# Patient Record
Sex: Female | Born: 1945 | Race: White | Hispanic: No
Health system: Southern US, Community
[De-identification: ages and names within clinical notes are randomized; demographics above are authoritative.]

## PROBLEM LIST (undated history)

## (undated) DIAGNOSIS — I10 Essential (primary) hypertension: Secondary | ICD-10-CM

## (undated) DIAGNOSIS — F419 Anxiety disorder, unspecified: Secondary | ICD-10-CM

## (undated) DIAGNOSIS — H409 Unspecified glaucoma: Secondary | ICD-10-CM

## (undated) DIAGNOSIS — E079 Disorder of thyroid, unspecified: Secondary | ICD-10-CM

## (undated) DIAGNOSIS — K219 Gastro-esophageal reflux disease without esophagitis: Secondary | ICD-10-CM

## (undated) DIAGNOSIS — M1990 Unspecified osteoarthritis, unspecified site: Secondary | ICD-10-CM

## (undated) DIAGNOSIS — E039 Hypothyroidism, unspecified: Secondary | ICD-10-CM

## (undated) DIAGNOSIS — I2699 Other pulmonary embolism without acute cor pulmonale: Secondary | ICD-10-CM

## (undated) DIAGNOSIS — J4530 Mild persistent asthma, uncomplicated: Secondary | ICD-10-CM

## (undated) DIAGNOSIS — Z9889 Other specified postprocedural states: Secondary | ICD-10-CM

## (undated) DIAGNOSIS — F329 Major depressive disorder, single episode, unspecified: Secondary | ICD-10-CM

## (undated) DIAGNOSIS — I82409 Acute embolism and thrombosis of unspecified deep veins of unspecified lower extremity: Secondary | ICD-10-CM

## (undated) DIAGNOSIS — R112 Nausea with vomiting, unspecified: Secondary | ICD-10-CM

## (undated) DIAGNOSIS — J189 Pneumonia, unspecified organism: Secondary | ICD-10-CM

## (undated) DIAGNOSIS — R05 Cough: Secondary | ICD-10-CM

## (undated) DIAGNOSIS — J309 Allergic rhinitis, unspecified: Secondary | ICD-10-CM

## (undated) DIAGNOSIS — F32A Depression, unspecified: Secondary | ICD-10-CM

## (undated) DIAGNOSIS — M720 Palmar fascial fibromatosis [Dupuytren]: Secondary | ICD-10-CM

## (undated) DIAGNOSIS — R053 Chronic cough: Secondary | ICD-10-CM

## (undated) HISTORY — DX: Gastro-esophageal reflux disease without esophagitis: K21.9

## (undated) HISTORY — PX: BLADDER SUSPENSION: SHX72

## (undated) HISTORY — PX: OTHER SURGICAL HISTORY: SHX169

## (undated) HISTORY — PX: DUPUYTREN CONTRACTURE RELEASE: SHX1478

## (undated) HISTORY — PX: BUNIONECTOMY: SHX129

## (undated) HISTORY — DX: Disorder of thyroid, unspecified: E07.9

## (undated) HISTORY — PX: KNEE ARTHROSCOPY: SUR90

## (undated) HISTORY — DX: Essential (primary) hypertension: I10

## (undated) HISTORY — PX: CATARACT EXTRACTION W/ INTRAOCULAR LENS  IMPLANT, BILATERAL: SHX1307

## (undated) HISTORY — PX: DILATION AND CURETTAGE OF UTERUS: SHX78

## (undated) HISTORY — DX: Hypothyroidism, unspecified: E03.9

---

## 1973-12-19 HISTORY — PX: TOTAL ABDOMINAL HYSTERECTOMY: SHX209

## 2000-11-23 ENCOUNTER — Other Ambulatory Visit: Admission: RE | Admit: 2000-11-23 | Discharge: 2000-11-23 | Payer: Self-pay | Admitting: Obstetrics and Gynecology

## 2001-12-24 ENCOUNTER — Ambulatory Visit (HOSPITAL_COMMUNITY): Admission: RE | Admit: 2001-12-24 | Discharge: 2001-12-24 | Payer: Self-pay | Admitting: Obstetrics and Gynecology

## 2001-12-24 ENCOUNTER — Encounter: Payer: Self-pay | Admitting: Obstetrics and Gynecology

## 2002-01-26 ENCOUNTER — Encounter: Payer: Self-pay | Admitting: Emergency Medicine

## 2002-01-26 ENCOUNTER — Emergency Department (HOSPITAL_COMMUNITY): Admission: EM | Admit: 2002-01-26 | Discharge: 2002-01-27 | Payer: Self-pay | Admitting: Emergency Medicine

## 2002-04-30 ENCOUNTER — Inpatient Hospital Stay (HOSPITAL_COMMUNITY): Admission: RE | Admit: 2002-04-30 | Discharge: 2002-05-01 | Payer: Self-pay | Admitting: Obstetrics and Gynecology

## 2002-07-04 ENCOUNTER — Ambulatory Visit (HOSPITAL_COMMUNITY): Admission: RE | Admit: 2002-07-04 | Discharge: 2002-07-04 | Payer: Self-pay | Admitting: Obstetrics and Gynecology

## 2003-05-12 ENCOUNTER — Encounter (INDEPENDENT_AMBULATORY_CARE_PROVIDER_SITE_OTHER): Payer: Self-pay | Admitting: Specialist

## 2003-05-12 ENCOUNTER — Ambulatory Visit (HOSPITAL_COMMUNITY): Admission: RE | Admit: 2003-05-12 | Discharge: 2003-05-12 | Payer: Self-pay | Admitting: *Deleted

## 2005-04-08 ENCOUNTER — Ambulatory Visit: Payer: Self-pay | Admitting: Internal Medicine

## 2005-08-31 ENCOUNTER — Ambulatory Visit: Payer: Self-pay | Admitting: Internal Medicine

## 2005-12-08 ENCOUNTER — Ambulatory Visit: Payer: Self-pay | Admitting: Internal Medicine

## 2006-01-02 ENCOUNTER — Ambulatory Visit: Payer: Self-pay | Admitting: Internal Medicine

## 2006-05-23 ENCOUNTER — Ambulatory Visit: Payer: Self-pay | Admitting: Internal Medicine

## 2006-09-16 ENCOUNTER — Emergency Department (HOSPITAL_COMMUNITY): Admission: EM | Admit: 2006-09-16 | Discharge: 2006-09-16 | Payer: Self-pay | Admitting: Emergency Medicine

## 2006-09-25 ENCOUNTER — Ambulatory Visit: Payer: Self-pay | Admitting: Internal Medicine

## 2006-12-14 ENCOUNTER — Ambulatory Visit: Payer: Self-pay | Admitting: Internal Medicine

## 2007-01-31 ENCOUNTER — Ambulatory Visit: Payer: Self-pay | Admitting: Internal Medicine

## 2007-05-31 ENCOUNTER — Ambulatory Visit: Payer: Self-pay | Admitting: Internal Medicine

## 2007-08-22 ENCOUNTER — Encounter: Admission: RE | Admit: 2007-08-22 | Discharge: 2007-08-22 | Payer: Self-pay | Admitting: Family Medicine

## 2007-10-17 ENCOUNTER — Ambulatory Visit: Payer: Self-pay | Admitting: Internal Medicine

## 2008-01-23 DIAGNOSIS — I10 Essential (primary) hypertension: Secondary | ICD-10-CM | POA: Insufficient documentation

## 2008-01-23 DIAGNOSIS — J3089 Other allergic rhinitis: Secondary | ICD-10-CM

## 2008-01-23 DIAGNOSIS — J4520 Mild intermittent asthma, uncomplicated: Secondary | ICD-10-CM

## 2008-01-23 DIAGNOSIS — K219 Gastro-esophageal reflux disease without esophagitis: Secondary | ICD-10-CM | POA: Insufficient documentation

## 2008-01-23 DIAGNOSIS — J302 Other seasonal allergic rhinitis: Secondary | ICD-10-CM | POA: Insufficient documentation

## 2008-01-23 DIAGNOSIS — E039 Hypothyroidism, unspecified: Secondary | ICD-10-CM | POA: Insufficient documentation

## 2008-01-24 ENCOUNTER — Ambulatory Visit: Payer: Self-pay | Admitting: Internal Medicine

## 2008-01-27 DIAGNOSIS — I10 Essential (primary) hypertension: Secondary | ICD-10-CM | POA: Insufficient documentation

## 2008-01-27 DIAGNOSIS — J45909 Unspecified asthma, uncomplicated: Secondary | ICD-10-CM | POA: Insufficient documentation

## 2008-03-13 ENCOUNTER — Ambulatory Visit: Payer: Self-pay | Admitting: Internal Medicine

## 2008-07-04 ENCOUNTER — Ambulatory Visit: Payer: Self-pay | Admitting: Internal Medicine

## 2008-08-27 ENCOUNTER — Encounter: Admission: RE | Admit: 2008-08-27 | Discharge: 2008-08-27 | Payer: Self-pay | Admitting: Family Medicine

## 2008-11-27 ENCOUNTER — Ambulatory Visit: Payer: Self-pay | Admitting: Internal Medicine

## 2009-02-10 ENCOUNTER — Ambulatory Visit: Payer: Self-pay | Admitting: Internal Medicine

## 2009-03-24 ENCOUNTER — Ambulatory Visit: Payer: Self-pay | Admitting: Internal Medicine

## 2009-08-19 ENCOUNTER — Ambulatory Visit (HOSPITAL_BASED_OUTPATIENT_CLINIC_OR_DEPARTMENT_OTHER): Admission: RE | Admit: 2009-08-19 | Discharge: 2009-08-19 | Payer: Self-pay | Admitting: Orthopedic Surgery

## 2009-08-31 ENCOUNTER — Encounter: Admission: RE | Admit: 2009-08-31 | Discharge: 2009-08-31 | Payer: Self-pay | Admitting: Family Medicine

## 2009-09-01 ENCOUNTER — Emergency Department (HOSPITAL_BASED_OUTPATIENT_CLINIC_OR_DEPARTMENT_OTHER): Admission: EM | Admit: 2009-09-01 | Discharge: 2009-09-01 | Payer: Self-pay | Admitting: Emergency Medicine

## 2009-09-07 ENCOUNTER — Ambulatory Visit: Payer: Self-pay | Admitting: Internal Medicine

## 2009-09-07 ENCOUNTER — Telehealth: Payer: Self-pay | Admitting: Internal Medicine

## 2010-02-04 ENCOUNTER — Ambulatory Visit: Payer: Self-pay | Admitting: Internal Medicine

## 2010-03-16 ENCOUNTER — Ambulatory Visit: Payer: Self-pay | Admitting: Internal Medicine

## 2010-06-01 ENCOUNTER — Ambulatory Visit: Payer: Self-pay | Admitting: Internal Medicine

## 2010-10-04 ENCOUNTER — Ambulatory Visit: Payer: Self-pay | Admitting: Internal Medicine

## 2010-10-12 ENCOUNTER — Encounter: Admission: RE | Admit: 2010-10-12 | Discharge: 2010-10-12 | Payer: Self-pay | Admitting: Family Medicine

## 2011-01-20 NOTE — Assessment & Plan Note (Signed)
Summary: allergy follow up visit/kcw   Primary Provider/Referring Provider:  Marinda Elk  CC:  Allergy follow up visit.  History of Present Illness:  01/24/08- Has a cold but otherwise feels she's done well.  Dr Foy [place] gave zpak. Still some cough. Would like more benzonatate. Denies fever. Nasal discharge is golden yellow. allergy vaccine GO 1:10 has done well- satisfied -helps Vaccine safety epipen talk  02-26-09- Allergic rhinitis, allergic asthma                        Husband with [place] Good year with no flu etc. 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 Can tell when she is due for allergy shot.  Had GOT for foot surgery with no respiratory complications.  March 16, 2010- allergic rhinitis, Assllergic asthma, GERD She recognizes cyclical cough with her GERD. Occasional otc antihistamine has been sufficient so far this year. Advair is used consistently without breakthrough cough or wheeze. Continues allergy vaccine without any problems. Needs update epipen.    Current Medications (verified): 1)  Flonase 50 Mcg/act Susp (Fluticasone Propionate) .Marland Kitchen.. 1-2 Sprays in Each Nostril 2)  Levothyroxine .... Take 1 Tablet By Mouth Once A Day 3)  Lactulose .Marland Kitchen.. 1 Tsp At [facility] 4)  Proair Hfa 108 (90 Base) Mcg/act Aers (Albuterol Sulfate) .... 2 Puffs Four Times A Day As Needed 5)  Advair Diskus 100-50 Mcg/dose  Misc (Fluticasone-Salmeterol) .... Use As Directed 6)  Benzonatate 100 Mg  Caps (Benzonatate) .Marland Kitchen.. 1 Every 6 Hours If Needed For Cough 7)  Allergy Vaccine Go 1:10 (W-E) 8)  Epipen 2-Pak 0.3 Mg/0.64ml (1:1000)  Devi (Epinephrine Hcl (Anaphylaxis)) .... For Severe Allergic Reaction 9)  Meloxicam 15 Mg Tabs (Meloxicam) .... Take 1 By Mouth Once Daily 10)  Requip 3 Mg Tabs (Ropinirole Hcl) .... Take 1 By Mouth Once Daily 11)  Pantoprazole Sodium 40 Mg Tbec (Pantoprazole Sodium) .... Take 1 By Mouth Two Times A Day  Allergies  (verified): 1)  ! Naprosyn  Past History:  Past Medical History: Last updated: 01/24/2008 Allergic Rhinitis Asthma G E R D Hypertension Hypothyroidism  Family History: Last updated: Feb 26, 2009 Father- died pneumonia, old age Mother- died arthritis, old age  Social History: Last updated: 2009/02/26 Patient never smoked.   Risk Factors: Smoking Status: never (01/24/2008)  Past Surgical History: Bunion sugery and right foot arch repair Total Abdominal Hysterectomy D&C x 3 Bladder tack x 2 Right foot bunion/rods Right knee arthroscopy Left foot  Review of Systems      See HPI  The patient denies anorexia, fever, weight loss, weight gain, vision loss, decreased hearing, hoarseness, chest pain, syncope, dyspnea on exertion, peripheral edema, prolonged cough, headaches, hemoptysis, abdominal pain, and severe indigestion/heartburn.    Vital Signs:  Patient profile:   65 year old female Height:      62 inches Weight:      189.38 pounds BMI:     34.76 O2 Sat:      100 % on Room air Pulse rate:   80 / minute BP sitting:   124 / 70  (left arm) Cuff size:   regular  Vitals Entered By: Reynaldo Minium CMA (March 16, 2010 10:05 AM)  O2 Flow:  Room air  Physical Exam  Additional Exam:  General: A/Ox3; pleasant and cooperative, NAD, SKIN: no rash, lesions NODES: no lymphadenopathy HEENT: [place]/AT, EOM- WNL, Conjuctivae- clear, PERRLA, TM-WNL, Nose- clear, Throat-  clear and wnl, Mallampati  III NECK: Supple w/ fair ROM, JVD- none, normal carotid impulses w/o bruits Thyroid- normal to palpation CHEST: Clear to P&A, no wheezing heard. HEART: RRR, no m/g/r heard ABDOMEN: Soft and nl;  GUY:QIHK, nl pulses, Right foot in cast NEURO: Grossly intact to observation      Impression & Recommendations:  Problem # 1:  ASTHMA (ICD-493.90) Good control. Medication rev iew done.  Problem # 2:  ALLERGIC RHINITIS (ICD-477.9)  Good seasonal control. She is doing well with her  meds and allergy vaccine. Her updated medication list for this problem includes:    Flonase 50 Mcg/act Susp (Fluticasone propionate) .Marland Kitchen... 1-2 sprays in each nostril  Medications Added to Medication List This Visit: 1)  Epipen 0.3 Mg/0.31ml Devi (Epinephrine) .... For severe allergic reaction 2)  Meloxicam 15 Mg Tabs (Meloxicam) .... Take 1 by mouth once daily 3)  Requip 3 Mg Tabs (Ropinirole hcl) .... Take 1 by mouth once daily 4)  Pantoprazole Sodium 40 Mg Tbec (Pantoprazole sodium) .... Take 1 by mouth two times a day  Other Orders: Est. Patient Level III (74259)  Patient Instructions: 1)  Please schedule a follow-up appointment in 1 year. 2)  continue present treatment - scripts refilled. Prescriptions: EPIPEN 0.3 MG/0.3ML DEVI (EPINEPHRINE) For severe allergic reaction  #1 x prn   Entered and Authorized by:   Waymon Budge MD   Signed by:   Waymon Budge MD on 03/16/2010   Method used:   Print then Give to Patient   RxID:   [id number] ADVAIR DISKUS 100-50 MCG/DOSE  MISC (FLUTICASONE-SALMETEROL) use as directed  #3 x 3   Entered and Authorized by:   Waymon Budge MD   Signed by:   Waymon Budge MD on 03/16/2010   Method used:   Print then Give to Patient   RxID:   [id number] PROAIR HFA 108 (90 BASE) MCG/ACT AERS (ALBUTEROL SULFATE) 2 puffs four times a day as needed  #3 x 3   Entered and Authorized by:   Waymon Budge MD   Signed by:   Waymon Budge MD on 03/16/2010   Method used:   Print then Give to Patient   RxID:   [id number] FLONASE 50 MCG/ACT SUSP (FLUTICASONE PROPIONATE) 1-2 sprays in each nostril  #3 x 3   Entered and Authorized by:   Waymon Budge MD   Signed by:   Waymon Budge MD on 03/16/2010   Method used:   Print then Give to Patient   RxID:   [id number]

## 2011-01-28 ENCOUNTER — Ambulatory Visit (INDEPENDENT_AMBULATORY_CARE_PROVIDER_SITE_OTHER): Payer: Self-pay

## 2011-01-28 DIAGNOSIS — J301 Allergic rhinitis due to pollen: Secondary | ICD-10-CM

## 2011-03-01 NOTE — Progress Notes (Deleted)
  Subjective:    Patient ID: Collene Leyden, female    DOB: Sep 15, 1946, 65 y.o.   MRN: [id number]  HPI    Review of Systems     Objective:   Physical ExamGeneral- Alert, Oriented, Affect-appropriate, Distress- none acute Skin- rash-none, lesions- none, excoriation- none Lymphadenopathy- none Head- atraumatic Eyes- Gross vision intact, PERRLA, conjunctivae clear, secretions Ears- Hearing, canals, Tm L ,   R , Nose- Clear, Septal dev, mucus, polyps, erosion, perforation  Throat- Mallampati II , mucosa clear , drainage- none, tonsils- atrophic Neck- flexible , trachea midline, no stridor , thyroid nl, carotid no bruit Chest - symmetrical excursion , unlabored    Heart/CV- RRR , no murmur , no gallop  , no rub, nl s1 s2                    - JVD- none , edema- none, stasis changes- none, varices- none    Lung- clear to P&A, wheeze- none, cough- none , dullness-none, rub- none    Chest wall- atraumatic, no scar Abd- tender-no, distended-no, bowel sounds-present, HSM- no Br/ Gen/ Rectal- Not done, not indicated Extrem- cyanosis- none, clubbing, none, atrophy- none, strength- nl Neuro- grossly intact to observation         Assessment & Plan:

## 2011-03-01 NOTE — Progress Notes (Signed)
  Subjective:    Patient ID: Collene Leyden, female    DOB: Apr 14, 1946, 65 y.o.   MRN: [id number]  HPI 22 yoF never smoker followed for allergic rhinitis and mild intermittent asthma. Husband also patient here with COPD. Chronic respiratory failure. She was last seen 03/16/10 and now comes for annual update. Continues allergy vaccine, giving own at 1:10, convinced it helps. Gets congested nose and chest as shot comes due. No adverse reactions. Needs update Epipen. Asked otc antihistamine recommendations.  Also asked to discuss her husband's declining health status and what to expect.    Review of Systems  Constitutional: Negative for fever, activity change and appetite change.  HENT: Positive for congestion, rhinorrhea, sneezing and postnasal drip. Negative for hearing loss, facial swelling, neck pain and ear discharge.   Eyes: Positive for itching. Negative for photophobia, discharge and redness.  Respiratory: Positive for cough. Negative for chest tightness, shortness of breath, wheezing and stridor.   Cardiovascular: Negative.   Gastrointestinal: Negative.   Musculoskeletal: Negative.   Skin: Negative.   Neurological: Negative for headaches.  Hematological: Negative for adenopathy. Does not bruise/bleed easily.       Objective:   Physical Exam General- Alert, Oriented, Affect-appropriate, Distress- none acute. Tearful discussing her husband. Skin- rash-none, lesions- none, excoriation- none  Lymphadenopathy- none  Head- atraumatic  Eyes- Gross vision intact, PERRLA, conjunctivae clear, secretions- watery Ears- Hearing, canals, Tm L-cerumen ,   R- normal ,  Nose- Clear, Septal dev- none, mucus bridging, polyps- none, erosion- none, perforation- none   Throat- Mallampati II , mucosa clear , drainage- none, tonsils- atrophic  Neck- flexible , trachea midline, no stridor , thyroid nl, carotid- no bruit  Chest - symmetrical excursion , unlabored     Heart/CV- RRR , no murmur  , no gallop  , no rub, nl s1 s2                     - JVD- none . Peripheral:  edema- none, stasis changes- none, varices- none     Lung- clear to P&A, wheeze- none, cough- none , dullness-none, rub- none     Chest wall- atraumatic, no scar  Abd- tender-no, distended-no, bowel sounds-present, HSM- no  Br/ Gen/ Rectal- Not done, not indicated  Extrem- cyanosis- none, clubbing, none, atrophy- none, strength- nl  Neuro- grossly intact to observation        Assessment & Plan:   This encounter was created in error - please disregard.

## 2011-03-02 ENCOUNTER — Encounter: Payer: Self-pay | Admitting: Internal Medicine

## 2011-03-02 ENCOUNTER — Encounter (INDEPENDENT_AMBULATORY_CARE_PROVIDER_SITE_OTHER): Payer: Commercial Indemnity | Admitting: Internal Medicine

## 2011-03-02 DIAGNOSIS — K219 Gastro-esophageal reflux disease without esophagitis: Secondary | ICD-10-CM

## 2011-03-02 DIAGNOSIS — J45909 Unspecified asthma, uncomplicated: Secondary | ICD-10-CM

## 2011-03-02 DIAGNOSIS — J309 Allergic rhinitis, unspecified: Secondary | ICD-10-CM

## 2011-03-02 NOTE — Assessment & Plan Note (Signed)
Reviewed warning symptoms, interaction of reflux with lung disease and basic reflux precautions.

## 2011-03-02 NOTE — Assessment & Plan Note (Signed)
Allergy vaccine is well tolerated and effective. Compliance is good with no adverse reaction. P- continue vaccine. Refill Epipen.

## 2011-03-02 NOTE — Assessment & Plan Note (Signed)
Mild intermittent asthma. Discussed maintenance vs rescue therapy. Needs to refill inhalers.

## 2011-03-03 NOTE — Progress Notes (Unsigned)
  Subjective:    Patient ID: [name], female    DOB: Feb 19, 1946, 65 y.o.   MRN: [id number]  Asthma Her past medical history is significant for asthma.      Review of Systems     Objective:   Physical Exam        Assessment & Plan:

## 2011-03-08 NOTE — Assessment & Plan Note (Signed)
Summary: rov 1 yr ///kp   Primary Provider/Referring Provider:  Marinda Elk  CC:  Yearly follow up visit-allergies and asthma..  History of Present Illness:  05-Mar-2009- Allergic rhinitis, allergic asthma                        Husband with [place] Good year with no flu etc. 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 Can tell when she is due for allergy shot.  Had GOT for foot surgery with no respiratory complications.  March 16, 2010- allergic rhinitis, Allergic asthma, GERD She recognizes cyclical cough with her GERD. Occasional otc antihistamine has been sufficient so far this year. Advair is used consistently without breakthrough cough or wheeze. Continues allergy vaccine without any problems. Needs update epipen.  March 02, 2011- allergic rhinitis, Allergic asthma, GERD Nurse-CC: Yearly follow up visit-allergies and asthma. For two  months, increased pressure in maxillary sinuses. OTC meds open her so it drains into throat with cough. Her asthma pattern ismore cough than wheeze. yard work this weekend increased her symptoms, but also house dusty now w/ renovation- remediating some mold. some pressure in ears. Minor headache. Eyes ok, some sneeze. Cough wakes occasionally. Denies chest tight or SOB. Did have  antibiotic from Dr Foy Gu[place] for ? sinus infection this winter.  Allergy vaccine 1:10 does help- she can feel when next shot due- well tolerated. We did a long discussion of how do cope with declining ealth of her husband, my patient.        Preventive Screening-Counseling & Management  Alcohol-Tobacco     Smoking Status: never  Current Medications (verified): 1)  Flonase 50 Mcg/act Susp (Fluticasone Propionate) .Marland Kitchen.. 1-2 Sprays in Each Nostril 2)  Levothyroxine .... Take 1 Tablet By Mouth Once A Day 3)  Lactulose .Marland Kitchen.. 1 Tsp At [facility] 4)  Proair Hfa 108 (90 Base) Mcg/act Aers (Albuterol Sulfate) .... 2 Puffs Four Times A  Day As Needed 5)  Advair Diskus 100-50 Mcg/dose  Misc (Fluticasone-Salmeterol) .... Use As Directed 6)  Benzonatate 100 Mg  Caps (Benzonatate) .Marland Kitchen.. 1 Every 6 Hours If Needed For Cough 7)  Allergy Vaccine Go 1:10 (W-E) 8)  Epipen 0.3 Mg/0.22ml Devi (Epinephrine) .... For Severe Allergic Reaction 9)  Losartan Potassium-Hctz 100-12.5 Mg Tabs (Losartan Potassium-Hctz) .... Take 1 By Mouth Once Daily 10)  Requip 3 Mg Tabs (Ropinirole Hcl) .... Take 1 By Mouth Once Daily 11)  Celebrex 200 Mg Caps (Celecoxib) .... Take 1 By Mouth Once Daily 12)  Effexor Xr 75 Mg Xr24h-Cap (Venlafaxine Hcl) .... Take 1 By Mouth Once Daily  Allergies (verified): 1)  ! Naprosyn  Past History:  Past Medical History: Last updated: 01/24/2008 Allergic Rhinitis Asthma G E R D Hypertension Hypothyroidism  Past Surgical History: Last updated: 03/16/2010 Bunion sugery and right foot arch repair Total Abdominal Hysterectomy D&C x 3 Bladder tack x 2 Right foot bunion/rods Right knee arthroscopy Left foot  Family History: Last updated: Mar 05, 2009 Father- died pneumonia, old age Mother- died arthritis, old age  Social History: Last updated: 03/02/2011 Patient never smoked.  Husband has severe COPD. chronic respiratory failure  Risk Factors: Smoking Status: never (03/02/2011)  Social History: Patient never smoked.  Husband has severe COPD. chronic respiratory failure  Review of Systems      See HPI       The patient complains of non-productive cough and nasal congestion/difficulty breathing through nose.  The patient denies shortness of breath with activity, shortness of breath at rest, productive cough, coughing up blood, chest pain, irregular heartbeats, acid heartburn, indigestion, loss of appetite, weight change, abdominal pain, difficulty swallowing, sore throat, tooth/dental problems, headaches, sneezing, itching, ear ache, and change in color of mucus.    Vital Signs:  Patient profile:   65  year old female Height:      62 inches Weight:      186.50 pounds BMI:     34.23 O2 Sat:      98 % on Room air Pulse rate:   73 / minute BP sitting:   120 / 80  (left arm) Cuff size:   regular  Vitals Entered By: Reynaldo Minium CMA (March 02, 2011 11:24 AM)  O2 Flow:  Room air CC: Yearly follow up visit-allergies and asthma.   Physical Exam  Additional Exam:  General: A/Ox3; pleasant and cooperative, NAD, SKIN: no rash, lesions NODES: no lymphadenopathy HEENT: [place]/AT, EOM- WNL, Conjuctivae- clear, PERRLA, TM-WNL, Nose- clear, Throat- clear and wnl, Mallampati  III NECK: Supple w/ fair ROM, JVD- none, normal carotid impulses w/o bruits Thyroid- normal to palpation CHEST: Clear to P&A, no wheezing heard. HEART: RRR, no m/g/r heard ABDOMEN: Soft and nl;  ZOX:WRUE, nl pulses, Right foot in cast NEURO: Grossly intact to observation      Impression & Recommendations:  Problem # 1:  ALLERGIC RHINITIS (ICD-477.9) We discussed available antihistamines for recent allergic rhinitis. I think some of the congestion she noted during the winter was more likely viral, but there has been a lot of overlap. I doubt sinus infection now.  We will continue vaccine as discussed, refilling Epipen. Her updated medication list for this problem includes:    Flonase 50 Mcg/act Susp (Fluticasone propionate) .Marland Kitchen... 1-2 sprays in each nostril  Problem # 2:  ASTHMA (ICD-493.90) Mild and control is good. She has appropriate meds.   Medications Added to Medication List This Visit: 1)  Losartan Potassium-hctz 100-12.5 Mg Tabs (Losartan potassium-hctz) .... Take 1 by mouth once daily 2)  Celebrex 200 Mg Caps (Celecoxib) .... Take 1 by mouth once daily 3)  Effexor Xr 75 Mg Xr24h-cap (Venlafaxine hcl) .... Take 1 by mouth once daily  Other Orders: Est. Patient Level III (45409)  Patient Instructions: 1)  Please schedule a follow-up appointment in 1 year. 2)  Continue allergy vaccine and keep your Proair  rescue inhaler available. 3)  Refill script for Epipen 4)  Consider otc antihistamine: 5)    loratadine/ Claritin 6)    fexofenadine 180/ Alegra 180 Prescriptions: EPIPEN 0.3 MG/0.3ML DEVI (EPINEPHRINE) For severe allergic reaction  #1 x prn   Entered and Authorized by:   Waymon Budge MD   Signed by:   Waymon Budge MD on 03/02/2011   Method used:   Print then Give to Patient   RxID:   [id number] ADVAIR DISKUS 100-50 MCG/DOSE  MISC (FLUTICASONE-SALMETEROL) use as directed  #3 x 3   Entered and Authorized by:   Waymon Budge MD   Signed by:   Waymon Budge MD on 03/02/2011   Method used:   Print then Give to Patient   RxID:   [id number] PROAIR HFA 108 (90 BASE) MCG/ACT AERS (ALBUTEROL SULFATE) 2 puffs four times a day as needed  #3 x 3   Entered and Authorized by:   Waymon Budge MD   Signed by:   Waymon Budge MD on 03/02/2011   Method  used:   Print then Give to Patient   RxID:   [id number] FLONASE 50 MCG/ACT SUSP (FLUTICASONE PROPIONATE) 1-2 sprays in each nostril  #3 x 3   Entered and Authorized by:   Waymon Budge MD   Signed by:   Waymon Budge MD on 03/02/2011   Method used:   Print then Give to Patient   RxID:   [id number]    Immunization History:  Influenza Immunization History:    Influenza:  historical (11/18/2010)  Pneumovax Immunization History:    Pneumovax:  historical (12/20/2007)

## 2011-03-25 LAB — POCT HEMOGLOBIN-HEMACUE: Hemoglobin: 11.7 g/dL — ABNORMAL LOW (ref 12.0–15.0)

## 2011-03-26 LAB — BASIC METABOLIC PANEL
BUN: 11 mg/dL (ref 6–23)
Chloride: 103 mEq/L (ref 96–112)
GFR calc Af Amer: >60 mL/min (ref >60)
GFR calc non Af Amer: >60 mL/min (ref >60)
Potassium: 4.4 mEq/L (ref 3.5–5.1)
Sodium: 136 mEq/L (ref 135–145)

## 2011-05-03 NOTE — Op Note (Signed)
NAME:  [name], [name]             ACCOUNT NO.:  [id number]   MEDICAL RECORD NO.:  [id number]          PATIENT TYPE:  AMB   LOCATION:  DSC                          FACILITY:  [facility]   PHYSICIAN:  Leonides Grills, M.D.     DATE OF BIRTH:  [birth year]   DATE OF PROCEDURE:  08/19/2009  DATE OF DISCHARGE:                               OPERATIVE REPORT   PREOPERATIVE DIAGNOSIS:  Right posterior horn medial meniscal tear.   POSTOPERATIVE DIAGNOSIS:  Right posterior horn medial meniscal tear.   OPERATIONS:  Right knee arthroscopy with debridement of medial meniscal  tear.   ANESTHESIA:  General.   SURGEON:  Leonides Grills, MD   ASSISTANT:  Richardean Canal, PA-C   ESTIMATED BLOOD LOSS:  Minimal.   TOURNIQUET TIME:  Approximately 45 minutes.   COMPLICATIONS:  None.   DISPOSITION:  Stable to PR.   INDICATIONS:  This is a 65 year old female who has had posterior medial  left knee pain that was interfering with her life to a point where she  cannot do what she wants to do.  MRI showed that she had a posterior  horn medial meniscal tear.  She had an injection which failed to relieve  her symptoms for any great length of time.  She was consented for the  above procedure.  All risks of infection, neurovessel injury, persistent  pain, worse pain, stiffness, arthritis, sinus formation, synovial cyst  formation, DVT, and PE were all explained.  Questions were encouraged  and answered.   OPERATION:  The patient was brought to the operating room and placed in  supine position.  After adequate general endotracheal anesthesia was  administered as well as Ancef 1 g IV piggyback, the right lower  extremity was then prepped and draped in sterile manner over proximally  placed thigh tourniquet with a thigh bolster placed.  With the right  knee abd lower extremity prepped and draped in sterile manner,  anatomical landmarks include patella tendon and superior pole of patella  were then mapped out.  A 20  mL of normal saline instilled into the knee.  Weston Brass and spread technique was then utilized to create the superomedial  portal for initial inflow.  We then palpated at the joint line and with  THE nick and spread technique, we made the anteromedial portal.  Once  the portal was made, we placed a blunt tip trocar with cannula followed  by camera into the knee and then under direct visualization, the  anterolateral portal was created with the spinal needle followed by nick  and spread technique.  There was synovitis in the anterior aspect of the  knee.  This was carefully debrided with a shaver as well as a  radiofrequency bevel.  ACL was intact.  There was a small area just  lateral to the lateral aspect of the medial femoral condyle within the  notch that had inflammatory tissue that traced back to the posterior  horn of medial meniscus.  This was a portion of the meniscus that  flapped up.  This was debrided.  We then traced this back, debrided the  posterior medial meniscus carefully visualizing through the notch and  using both a 2.9-mm shaver as well as a basket, this was carefully  debrided back to normal meniscus.  Once this was done, the remaining  portion of the meniscus was completely normal and this was probed as  well to make sure that this had an adequate insertion and there was no  residual flapping or instability of the meniscus.  The entire meniscus  was probed and there was no subluxation of the meniscus and we did the  same thing for the lateral meniscus as well.  Both medial and lateral  gutters of the ankle were without pathology and the patellofemoral joint  again was without pathology as well.  Pictures were obtained throughout  the procedure.  Instruments were removed.  Wounds were closed with 4-0  nylon stitch.  A sterile dressing was applied.  The patient was stable  to the PR.      Leonides Grills, M.D.  Electronically Signed     PB/MEDQ  D:  08/19/2009  T:   08/20/2009  Job:  [id number]

## 2011-05-06 NOTE — Op Note (Signed)
   NAME:  [name], [name]                       ACCOUNT NO.:  [id number]   MEDICAL RECORD NO.:  [id number]                   PATIENT TYPE:  AMB   LOCATION:  ENDO                                 FACILITY:  [facility]   PHYSICIAN:  Georgiana Spinner, M.D.                 DATE OF BIRTH:  21-Mar-1946   DATE OF PROCEDURE:  05/12/2003  DATE OF DISCHARGE:                                 OPERATIVE REPORT   PROCEDURE:  Colonoscopy.   INDICATIONS FOR PROCEDURE:  Colon polyps.   ANESTHESIA:  Demerol 30, Versed 3 mg.   DESCRIPTION OF PROCEDURE:  With the patient mildly sedated in the left  lateral decubitus position, the Olympus video colonoscope was inserted in  the rectum and passed under direct vision to the cecum, identified by the  ileocecal valve and appendiceal orifice, both of which were photographed.   From this point we entered into the terminal ileum which also appeared  normal and was photographed. The endoscope was then withdrawn, taking  circumferential views of the remaining colonic mucosa, stopping only in the  rectum which appeared normal on direct and retroflex view. The endoscope was  straightened and withdrawn.   The patient's vital signs and pulse oximetry remained stable. The patient  tolerated the procedure well without apparent complications.   FINDINGS:  Negative examination.   PLAN:  Repeat examination  in 5 years.                                                Georgiana Spinner, M.D.    GMO/MEDQ  D:  05/12/2003  T:  05/12/2003  Job:  [id number]

## 2011-05-06 NOTE — Assessment & Plan Note (Signed)
[facility]                             PULMONARY OFFICE NOTE   NAME:[name], [name]                    MRN:          [id number]  DATE:12/14/2006                            DOB:          [birth year]    PULMONARY/ALLERGY FOLLOWUP:   PROBLEM LIST:  1. Allergic rhinitis.  2. Asthma.  3. Esophageal reflux.  4. Hypertension.  5. Hypothyroid.   HISTORY:  She comes for a one-year followup reporting a slight cold that  she is managing okay.  Describes considerable family stress, partly  related to her husband's chronic illness.  She had a barium swallow  positive for reflux and Dr. Foy [name] has increased her Nexium to b.i.d.  which is helping.  She has had less routine cough.   MEDICATIONS:  1. Allergy vaccine continues at 1:10.  2. Micardis 80 mg.  3. Nexium 40 mg.  4. Celebrex.  5. Advair 100/50.  6. Flonase.  7. Levothyroxine.  8. Zoloft 100 mg.  9. Lactulose.  10.Allegra.  11.Albuterol.   DRUG INTOLERANT:  NAPROSYN.   OBJECTIVE:  VITAL SIGNS:  Weight 185 pounds, BP 110/66, pulse regular  72, room air saturation 99%.  HEENT:  There is mild nasal stuffiness and some clear mucus.  Her  pharynx is not red.  Voice quality is normal without stridor.  There is  no neck vein distention.  LUNGS:  Clear without cough, even with forced expiration.  HEART:  Sounds are regular without murmur or gallop.  EXTREMITIES:  There is no edema.   IMPRESSION:  1. Acute upper respiratory illness, viral syndrome.  2. Esophageal reflux addressed with reflux precautions and twice a day      Nexium.  3. Asthma, controlled.  4. Allergic rhinitis, controlled.   PLAN:  After discussion, I gave a stand-by prescription for doxycycline  to have available during the holiday season when offices are closed in  case her secretions become frankly purulent.  Otherwise, she will  continue present meds and her allergy vaccine and continue followup in  one year, earlier  p.r.n.     Anita D. Maple Hudson, MD, [name], FACP  Electronically Signed    CDY/MedQ  DD: 12/16/2006  DT: 12/16/2006  Job #: [id number]   cc:   [name] L. [name], M.D.

## 2011-05-06 NOTE — Discharge Summary (Signed)
[facility]  Patient:    [name], [name] Visit Number: [id number] MRN: [id number]          Service Type: GYN Location: 9300 [id number] Attending Physician:  Michaele Offer Dictated by:   Zenaida Niece, M.D. Admit Date:  04/30/2002 Discharge Date: 05/01/2002                             Discharge Summary  ADMISSION DIAGNOSES:             1. Cystocele.                                  2. Rectocele.                                  3. Stress urinary incontinence.  DISCHARGE DIAGNOSES:             1. Cystocele.                                  2. Rectocele.                                  3. Stress urinary incontinence.  PROCEDURES:                      Anterior and posterior colporrhaphy and                                  attempted TVT.  COMPLICATIONS:                   Bladder perforation with TVT.  HISTORY AND PHYSICAL:            Please see chart for full history and physical.  Briefly, the patient is a 65 year old white female para 2-0-1-2 who has pelvic pressure from a cystocele and rectocele and has constipation associated with splinting.  She also has stress urinary incontinence.  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  She is admitted for surgical therapy.  Physical exam is significant for a grade 2 cystocele, grade 1 rectocele, with good vaginal vault support.  She does have a positive cough stress test with urine leakage with strain.  HOSPITAL COURSE:                 The patient was admitted and underwent an A&P repair under spinal anesthesia.  I attempted to do a TVT for urinary incontinence but due to persistent bladder perforation, I was unable to complete the procedure.  She was observed overnight due to bladder perforation with a vaginal packing in and a Foley catheter.  On the morning of postoperative day #1, she had good pain control, no nausea, and  no abdominal pain.  She was afebrile.  Preoperative hemoglobin was 11.3, postoperative was 10.1.  Vaginal packing was removed with moderate stain.  Preoperative urine culture was negative.  The patient was sent home with a Foley catheter and Levaquin prophylaxis.  DISCHARGE INSTRUCTIONS:  DIET:  Regular diet.  ACTIVITY:                        Pelvic rest and no strenuous activity.  FOLLOWUP:                        Followup is in 1 week for Foley catheter removal.  MEDICATIONS:                     Levaquin 250 mg p.o. daily for prophylaxis and Percocet p.r.n. pain. Dictated by:   Zenaida Niece, M.D. Attending Physician:  Michaele Offer DD:  05/23/02 TD:  05/26/02 Job: [id number] [id number]

## 2011-05-06 NOTE — Op Note (Signed)
[facility]  Patient:    [name], [name] Visit Number: [id number] MRN: [id number]          Service Type: GYN Location: 9300 [id number] Attending Physician:  Michaele Offer Dictated by:   Zenaida Niece, M.D. Proc. Date: 04/30/02 Admit Date:  04/30/2002 Discharge Date: 05/01/2002                             Operative Report  PREOPERATIVE DIAGNOSES:       1. Cystocele.                               2. Rectocele.                               3. Stress urinary incontinence.  POSTOPERATIVE DIAGNOSES:      1. Cystocele.                               2. Rectocele.                               3. Stress urinary incontinence.                               4. Bladder perforation.  OPERATION:                    Anterior and posterior colporrhaphy and                               attempted TBT.  SURGEON:                      Zenaida Niece, M.D.  ANESTHESIA:                   Spinal.  ESTIMATED BLOOD LOSS:         100 cc.  FINDINGS:                     Grade 2 cystocele and rectocele.  With the TBT procedure, there was good placement on the right, but bladder perforation on the left x2.  The second bladder perforation was not found until the tape had been pulled through, so the TBT was removed, and could be addressed at another time.  DESCRIPTION OF PROCEDURE:     The patient was taken to the operating room and placed in the sitting position.  Dr. Arby Barrette instilled spinal anesthesia and she was placed in the dorsal supine position, and legs were placed in mobile stirrups.  The lower abdomen, perineum, and vagina were prepped and draped in the usual sterile fashion.  The bladder drained with a red rubber catheter.  A weighted speculum was inserted in the vagina and the vaginal apex was grasped with Allis clamps.  A piece of vaginal mucosa from the adnexa was removed sharply.  The vaginal mucosa was dissected in the midline from the  vaginal apex to about 3 cm proximal to the urethral meatus.  The vaginal mucosa was freed up laterally to help mobilize the  cystocele.  The cystocele was then reduced with interrupted sutures of 2-0 Vicryl.  Excess vaginal mucosa was removed and the vagina was closed with a running locking 2-0 Vicryl.  Attention was then turned to the TBT.  Prior to the anterior repair, areas were marked on her abdomen where the puncture wounds will be made.  These areas were anesthetized with a combination of 0.25% Marcaine and 2% lidocaine. The retropubic space was then anesthetized with this solution as well for hydrodissection and pain control.  Hydrodissection was then performed vaginally on both sides of the urethra in the anticipated tract of the TBT device.  The vaginal mucosa was then incised in the midline, approximately 1.5 cm longitudinally, starting 1 cm from the urethral meatus.  Sharp dissection freed the vagina from the underlying bladder and periurethral space up to the level of the endopelvic fascia.  This was done on both sides.  The TBT was then assembled and placed on the right side.  Cystoscopy revealed an indentation along the right side of the bladder.  The TBT was removed and placed again slightly more laterally.  The cystoscopy revealed adequate placement and the tape was pulled through on the patients right side.  The tape was then placed on the left side.  The introducer was introduced, and once it was in place, cystoscopy revealed bladder perforation on the left side.  The introducer was removed and placement was again attempted to go more laterally.  Cystoscopy at this time was a little bit bloody from her prior perforation, but no perforation was seen.  However, once the tape was pulled through on the left side, there was obvious urine leak from vaginal and abdominal incisions.  Cystoscopy revealed that the tape was through the bladder on the left side.  The tape was then cut  in the midline and pulled through on both sides.  The vaginal mucosa was closed with running locking 2-0 Vicryl.  Attention was turned to the posterior repair.  Allis clamps were used to grasp the hymenal ring at a point that would allow two fingers to easily pass in the vaginal canal.  A piece of vaginal mucosa was removed sharply.  The posterior vaginal mucosa was dissected in the midline from the introitus to the vaginal apex.  This was then dissected laterally to free up the rectocele.  Rectal exam revealed no rectal injury.  The rectocele was reduced with interrupted sutures of 2-0 Vicryl.  Repeat rectal exam confirmed no rectal injury.  The excess vaginal mucosa was removed and the vagina was closed with a running locking 2-0 Vicryl.  Two interrupted sutures of 2-0 Vicryl were needed for two separate bleeding sites.  The abdominal incisions were cleaned and closed with Steri-Strips.  The vagina was then packed with 2 inch Iodoform gauze.  The Foley did drain fairly bloody urine.  The Foley was irrigated with saline to remove as many clots as possible.  A three-way Foley was then placed in case further irrigation was necessary.  She will be placed on Levaquin 250 mg a day and the Foley catheter will be left in place for five to seven days to allow this bladder perforation to heal.  The patient tolerated the procedure well and was taken to the recovery room in stable condition.  All counts were correct x2.  The patient received Ancef 1 g prior to surgery. Dictated by:   Zenaida Niece, M.D. Attending Physician:  Michaele Offer DD:  04/30/02 TD:  05/01/02 Job: [id number]

## 2011-05-06 NOTE — Op Note (Signed)
[facility]  Patient:    [name], [name] Visit Number: [id number] MRN: [id number]          Service Type: DSU Location: [facility] Attending Physician:  Michaele Offer Dictated by:   Zenaida Niece, M.D. Proc. Date: 07/04/02 Admit Date:  07/04/2002 Discharge Date: 07/04/2002                             Operative Report  PREOPERATIVE DIAGNOSIS:       Stress urinary incontinence.  POSTOPERATIVE DIAGNOSIS:      Stress urinary incontinence.  OPERATION:                    Tension free vaginal tape (TVT).  SURGEON:                      Zenaida Niece, M.D.  ANESTHESIA:                   Intravenous sedation and local block.  DESCRIPTION OF PROCEDURE:     The patient was taken to the operating room and placed in the dorsal supine position.  She was given IV sedation and then placed in stirrups.  The lower abdomen was shaved, and the lower abdomen, perineum and vagina were prepped and draped in the usual sterile fashion. Local block with 2% lidocaine and 0.25% Marcaine with epinephrine was given suprapubically as well as paravaginally.  Incisions were made right at the level of the pubic bone approximately 3 cm on either side of the midline with the knife after local was injected.  A 1.5 cm vertical incision was made in the vagina approximately 1.5 cm from the urethral meatus going into the vagina.  Using pickups and Metzenbaums the vagina was dissected from the underlying bladder laterally on each side to the level of the screw on the Metzenbaum scissors.  This dissection was fairly easily done.  The TVT device was then opened.  The device was then placed on the patients right side and cystoscopy confirmed that the needle was not in the bladder.  The needle was then pulled through the abdominal incision.  The left side was also placed, cystoscopy performed and confirmed that the needle was not in the bladder. The needle was also pulled  through the abdomen on that side.  The placement of the device was done with a Foley catheter into the bladder to move the urethra to the ipsilateral side.  The IV sedation was decreased and the TVT was adjusted so there was minimal leak when the patient coughed.  The sheath was then removed from the tape easily, and the ends were cut to the level of the skin incisions.  The vaginal incision was closed with running locking 2-0 Vicryl after adequately closing the vaginal incision.  The vagina was inspected, and there was found to be no buttonholing, and the vaginal incision was adequately closed.  The abdominal incisions were then closed with Steri-Strips and Band-aids.  The patient tolerated the procedure well and was taken to the recovery room in stable condition.  Counts were correct, and she received 1 g of Ancef prior to the procedure. Dictated by:   Zenaida Niece, M.D. Attending Physician:  Michaele Offer DD:  07/04/02 TD:  07/09/02 Job: [id number]

## 2011-05-06 NOTE — H&P (Signed)
[facility]  Patient:    [name], VITELLI Visit Number: [id number] MRN: [id number]          Service Type: Attending:  Zenaida Niece, M.D. Dictated by:   Zenaida Niece, M.D. Adm. Date:  07/04/02                           History and Physical  CHIEF COMPLAINT:              Stress urinary incontinence.  HISTORY OF PRESENT ILLNESS:   This is a 65 year old white female gravida 3 para 2-0-1-2 who was taken to the operating room on May 13 where she had an anterior and posterior colporrhaphy performed and an attempted tension-free vaginal tape.  Due to recurrent bladder perforation, the tension-free vaginal tape was abandoned and the remainder of the procedure was continued.  She has continued to experience stress urinary incontinence and is admitted for repeat attempt at tension-free vaginal tape.  PAST OBSTETRICAL HISTORY:     Two vaginal deliveries at term without complications and one spontaneous abortion.  PAST MEDICAL HISTORY:         Asthma, hypertension, hypothyroidism, osteoarthritis, and detrusor instability.  SURGICAL HISTORY:             Cataract surgery with lens implants, prior anterior colporrhaphy, total abdominal hysterectomy with appendectomy, and two D&Cs, as well as the above-mentioned anterior and posterior repair.  ALLERGIES:                    None known.  CURRENT MEDICATIONS:          1. Prinivil for hypertension.                               2. Premarin 0.625 mg q.d.                               3. Synthroid for hypothyroidism.                               4. Advair, Flovent, and Singulair for asthma.                               5. Ditropan XL 10 mg q.d. for detrusor                                  instability.  SOCIAL HISTORY:               The patient is married and denies alcohol, tobacco, or drug use.  FAMILY HISTORY:               The patients mother had colon cancer.  REVIEW OF SYSTEMS:             Negative.  PHYSICAL EXAMINATION:  VITAL SIGNS:                  Weight 162 pounds.  She is afebrile with stable vital signs.  GENERAL:                      She is a well-developed, well-nourished white  female in no acute distress.  NECK:                         Supple without lymphadenopathy or thyromegaly.  LUNGS:                        Clear to auscultation.  HEART:                        Regular rate and rhythm without murmur.  ABDOMEN:                      Soft, nontender, nondistended, without palpable masses and she does have a vertical scar.  Her previous small incisions from the prior TVT have healed well.  EXTREMITIES:                  No edema, nontender, and DTRs are 2/4 and symmetric.  PELVIC:                       Reveals no external lesions.  Per speculum exam her vagina is well healed and on bimanual exam there is a slightly narrow vaginal apex but she is nontender.  BACK:                         She has a slightly tender left pelvic bone and iliac crest and sacroiliac joint.  ASSESSMENT:                   Stress urinary incontinence with a prior failed tension-free vaginal tape due to recurrent bladder perforation.  PLAN:                         Admit the patient as an outpatient for a TVT under local anesthesia and IV sedation. Dictated by:   Zenaida Niece, M.D. Attending:  Zenaida Niece, M.D. DD:  07/02/02 TD:  07/02/02 Job: [id number]

## 2011-05-06 NOTE — H&P (Signed)
[facility]  Patient:    [name], [name] Visit Number: [id number] MRN: [id number]          Service Type: GYN Location: [id number] Attending Physician:  Michaele Offer Dictated by:   Zenaida Niece, M.D. Admit Date:  04/30/2002                           History and Physical  CHIEF COMPLAINT:              Pelvic relaxation and stress urinary incontinence.  HISTORY OF PRESENT ILLNESS:   This is a 65 year old white female gravida 3 para 2-0-1-2 who was last seen for a full exam in December 2002.  Patient complains of pelvic pressure from a cystocele and rectocele, as well as constipation with splinting.  She also complains of stress urinary incontinence which is worse when she coughs with her asthma.  She also has detrusor instability which is improved with Ditropan, but on top of that she definitely has stress incontinence.  Exam is significant for pelvic relaxation and after discussion with the patient she is admitted for anterior and posterior repair and TVT procedure for urinary incontinence.  PAST OBSTETRICAL HISTORY:     Significant for two vaginal deliveries at term without complications and one spontaneous miscarriage.  PAST MEDICAL HISTORY:         Significant for asthma, hypertension, hypothyroidism, osteoarthritis, and detrusor instability.  SURGICAL HISTORY:             Cataract surgery with lens implants, prior anterior colporrhaphy, total abdominal hysterectomy with appendectomy, and two D&Cs.  ALLERGIES:                    None known.  CURRENT MEDICATIONS:          1. Prinivil for hypertension.                               2. Premarin 0.625 mg q.d.                               3. Synthroid for hypothyroidism.                               4. Advair, Flovent, and Singulair for asthma.                               5. Ditropan XL 10 mg a day for detrusor                                  instability.  SOCIAL  HISTORY:               Patient is married and denies alcohol, tobacco, or drug use.  FAMILY HISTORY:               Patients mother had colon cancer.  REVIEW OF SYSTEMS:            Actually, at this point her lungs are doing very well although she has in the past few months had an exacerbation of her asthma which required the addition of Singulair to her  regimen.  PHYSICAL EXAMINATION:  VITAL SIGNS:                  Weight is 162 pounds.  Blood pressure 132/80.  GENERAL:                      She is a well-developed, well-nourished white female who is in no acute distress.  NECK:                         Supple without lymphadenopathy or thyromegaly.  LUNGS:                        Clear to auscultation.  HEART:                        Regular rate and rhythm without murmur.  ABDOMEN:                      Soft, nontender, nondistended, without palpable masses, and she does have a vertical scar.  EXTREMITIES:                  Have no edema, are nontender, and DTRs are 2/4 and symmetric.  PELVIC:                       External genitalia is without lesions.  Vaginal exam reveals a grade 2 cystocele, grade 1 rectocele, and good vaginal vault support.  Adnexa reveals no significant masses and this was confirmed by rectovaginal exam.  She does have a positive cough test with urine leakage with strain.  ASSESSMENT:                   Symptomatic pelvic relaxation and stress urinary incontinence.  Patient wishes to proceed with definitive surgical therapy. Risks of surgery including bleeding, infection, and damage to bowels, bladder, and ureters have been discussed with the patient.  Patient wishes to proceed.  PLAN:                         Admit the patient for anterior and posterior colporrhaphy as well as TVT for stress incontinence. Dictated by:   Zenaida Niece, M.D. Attending Physician:  Michaele Offer DD:  04/29/02 TD:  04/29/02 Job: [id number]

## 2011-05-06 NOTE — Op Note (Signed)
   NAME:  Anita, [name]                       ACCOUNT NO.:  [id number]   MEDICAL RECORD NO.:  [id number]                   PATIENT TYPE:  AMB   LOCATION:  ENDO                                 FACILITY:  [facility]   PHYSICIAN:  Georgiana Spinner, M.D.                 DATE OF BIRTH:  01-Apr-1946   DATE OF PROCEDURE:  05/12/2003  DATE OF DISCHARGE:                                 OPERATIVE REPORT   PROCEDURE:  Upper endoscopy with biopsy.   INDICATIONS FOR PROCEDURE:  Gastroesophageal reflux disease.   ANESTHESIA:  Demerol 60, Versed 6 mg.   DESCRIPTION OF PROCEDURE:  With the patient mildly sedated in the left  lateral decubitus position, the Olympus videoscopic endoscope was inserted  in the mouth, passed under direct vision through the esophagus which  appeared normal. The distal esophagus was approached and there was a  questionable area of short segment Barrett's photographed and biopsied. We  entered into the stomach, the fundus, body, antrum, duodenal bulb and second  portion of the duodenum all appeared normal. From this point, the endoscope  was slowly withdrawn taking circumferential views of the entire duodenal  mucosa visualized until the endoscope was then pulled back into the stomach  and placed in retroflexion to view the stomach from below. The endoscope was  then straightened and withdrawn taking circumferential views of the  remaining gastric and esophageal mucosa. The patient's vital signs and pulse  oximeter remained stable. The patient tolerated the procedure well without  apparent complications.   FINDINGS:  Question of Barrett's esophagus biopsied with a hiatal hernia  photographed.   PLAN:  Will have the patient call me for results of biopsy and followup with  me as an outpatient. Proceed to colonoscopy as planned.                                               Georgiana Spinner, M.D.    GMO/MEDQ  D:  05/12/2003  T:  05/12/2003  Job:  [id number]

## 2011-06-08 ENCOUNTER — Ambulatory Visit (INDEPENDENT_AMBULATORY_CARE_PROVIDER_SITE_OTHER): Payer: Commercial Indemnity

## 2011-06-08 DIAGNOSIS — J309 Allergic rhinitis, unspecified: Secondary | ICD-10-CM

## 2011-09-15 ENCOUNTER — Other Ambulatory Visit: Payer: Self-pay | Admitting: Family Medicine

## 2011-09-15 DIAGNOSIS — Z1231 Encounter for screening mammogram for malignant neoplasm of breast: Secondary | ICD-10-CM

## 2011-10-05 ENCOUNTER — Telehealth: Payer: Self-pay | Admitting: Internal Medicine

## 2011-10-05 NOTE — Telephone Encounter (Signed)
Called, spoke with pt.  She c/o cough, head congestion, increased labored breathing - states this has been ongoing since the Spring time.  She is requesting OV with CDY - scheduled for tomorrow, Oct 18 at 9:15am.  Pt aware and was advised to seek emergency care - UC/ED - if symptoms worsen prior to OV and believes she needs to be evaluated prior.  She verbalized understanding of this.

## 2011-10-05 NOTE — Telephone Encounter (Signed)
Message dup

## 2011-10-06 ENCOUNTER — Ambulatory Visit (INDEPENDENT_AMBULATORY_CARE_PROVIDER_SITE_OTHER): Payer: Commercial Indemnity | Admitting: Internal Medicine

## 2011-10-06 ENCOUNTER — Encounter: Payer: Self-pay | Admitting: Internal Medicine

## 2011-10-06 VITALS — BP 132/68 | HR 87 | Ht 62.0 in | Wt 193.8 lb

## 2011-10-06 DIAGNOSIS — J309 Allergic rhinitis, unspecified: Secondary | ICD-10-CM

## 2011-10-06 DIAGNOSIS — J45909 Unspecified asthma, uncomplicated: Secondary | ICD-10-CM

## 2011-10-06 DIAGNOSIS — Z23 Encounter for immunization: Secondary | ICD-10-CM

## 2011-10-06 MED ORDER — BENZONATATE 100 MG PO CAPS: 100.0000 mg | ORAL_CAPSULE | Freq: Four times a day (QID) | ORAL | Status: AC | PRN

## 2011-10-06 MED ORDER — METHYLPREDNISOLONE ACETATE 80 MG/ML IJ SUSP
80.0000 mg | Freq: Once | INTRAMUSCULAR | Status: AC
  Administered 2011-10-06: 80 mg via INTRAMUSCULAR

## 2011-10-06 MED ORDER — PHENYLEPHRINE HCL 1 % NA SOLN
3.0000 [drp] | Freq: Once | NASAL | Status: AC
  Administered 2011-10-06: 3 [drp] via NASAL

## 2011-10-06 MED ORDER — CEFDINIR 300 MG PO CAPS: 300.0000 mg | ORAL_CAPSULE | Freq: Two times a day (BID) | ORAL | Status: AC

## 2011-10-06 NOTE — Assessment & Plan Note (Signed)
She remains convinced allergy shots have on going value for her.  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

## 2011-10-06 NOTE — Patient Instructions (Signed)
Flu vax  Neb neo nasal  Depo 80  Script sent for benzonatate pearls for cough  Script sent for cefdinir antibiotic

## 2011-10-06 NOTE — Assessment & Plan Note (Signed)
Pattern fits cyclical cough- reflux and postnasal drip. Depo should help.

## 2011-10-06 NOTE — Progress Notes (Signed)
[birth year]11- 65 yoF never smoker followed for allergic rhinitis, asthma, complicated by GERD, HBP Last here-03/02/11   Since last here husband Molly Maduro "Elliot Gurney" died. If she misses allergy shot by a few days she is convinced she feels it and wants to continue. All summer into fall has had head congestion, some drainage, fullness in throat. In recent days has yellow cough, pressure left ear.  Denies fever, green, sore throat, swollen glands. Says this interacts and aggravates her reflux.  Dr Foy Guadalajara gave Zpak 2 mponths ago- little help.   ROS Constitutional:   No-   weight loss, night sweats, fevers, chills, fatigue, lassitude. HEENT:   No-  headaches, difficulty swallowing, tooth/dental problems, sore throat,       No-  sneezing, itching, ear ache,    +nasal congestion, post nasal drip,  CV:  No-   chest pain, orthopnea, PND, swelling in lower extremities, anasarca, dizziness, palpitations Resp: No-   shortness of breath with exertion or at rest.              +  productive cough,  No non-productive cough,  No- coughing up of blood.              No-   change in color of mucus.  No- wheezing.   Skin: No-   rash or lesions. GI:  No-   heartburn, indigestion, abdominal pain, nausea, vomiting, diarrhea,                 change in bowel habits, loss of appetite GU: No-   dysuria, change in color of urine, no urgency or frequency.  No- flank pain. MS:  No-   joint pain or swelling.  No- decreased range of motion.  No- back pain. Neuro-     nothing unusual Psych:  No- change in mood or affect. Some depression or anxiety.  No memory loss.  OBJ General- Alert, Oriented, Affect-appropriate, Distress- none acute, overweight Skin- rash-none, lesions- none, excoriation- none Lymphadenopathy- none Head- atraumatic            Eyes- Gross vision intact, PERRLA, conjunctivae clear secretions            Ears- Hearing, canals-normal- minor wax.+ Left TM may be retracted.            Nose- Clear, no-Septal dev, mucus,  polyps, erosion, perforation             Throat- Mallampati II , mucosa clear , drainage- none, tonsils- atrophic   Not red Neck- flexible , trachea midline, no stridor , thyroid nl, carotid no bruit Chest - symmetrical excursion , unlabored           Heart/CV- RRR , no murmur , no gallop  , no rub, nl s1 s2                           - JVD- none , edema- none, stasis changes- none, varices- none           Lung- clear to P&A, +Recurrent dry cough, dullness-none, rub- none           Chest wall-  Abd- tender-no, distended-no, bowel sounds-present, HSM- no Br/ Gen/ Rectal- Not done, not indicated Extrem- cyanosis- none, clubbing, none, atrophy- none, strength- nl Neuro- grossly intact to observation

## 2011-10-14 ENCOUNTER — Ambulatory Visit
Admission: RE | Admit: 2011-10-14 | Discharge: 2011-10-14 | Disposition: A | Discharge status: Home / Self Care | Payer: Commercial Indemnity | Source: Ambulatory Visit | Attending: Family Medicine | Admitting: Family Medicine

## 2011-10-14 DIAGNOSIS — Z1231 Encounter for screening mammogram for malignant neoplasm of breast: Secondary | ICD-10-CM

## 2011-11-21 ENCOUNTER — Ambulatory Visit (INDEPENDENT_AMBULATORY_CARE_PROVIDER_SITE_OTHER): Payer: Commercial Indemnity

## 2011-11-21 DIAGNOSIS — J309 Allergic rhinitis, unspecified: Secondary | ICD-10-CM

## 2011-12-10 ENCOUNTER — Emergency Department (HOSPITAL_COMMUNITY)
Admission: EM | Admit: 2011-12-10 | Discharge: 2011-12-10 | Discharge status: Against Medical Advice | Payer: Commercial Indemnity | Attending: Emergency Medicine | Admitting: Emergency Medicine

## 2011-12-10 ENCOUNTER — Emergency Department (HOSPITAL_COMMUNITY): Payer: Commercial Indemnity

## 2011-12-10 ENCOUNTER — Encounter (HOSPITAL_COMMUNITY): Payer: Self-pay | Admitting: Emergency Medicine

## 2011-12-10 DIAGNOSIS — R0602 Shortness of breath: Secondary | ICD-10-CM | POA: Insufficient documentation

## 2011-12-10 DIAGNOSIS — R05 Cough: Secondary | ICD-10-CM | POA: Insufficient documentation

## 2011-12-10 DIAGNOSIS — R059 Cough, unspecified: Secondary | ICD-10-CM | POA: Insufficient documentation

## 2011-12-10 HISTORY — DX: Unspecified osteoarthritis, unspecified site: M19.90

## 2011-12-10 NOTE — ED Notes (Signed)
Pt presented to the ER with c/o Asthma attack and persistent cough

## 2011-12-10 NOTE — ED Notes (Signed)
Pt states that s/s started 15-20 min ago, pt was brought in by the family member, pt presented with Asthma attack and persisten cough, no wheezing noted upon assessment, pt states that when in this states, usullay no wheezing present instead uncontrolled and irritated cough.

## 2012-03-30 ENCOUNTER — Ambulatory Visit (INDEPENDENT_AMBULATORY_CARE_PROVIDER_SITE_OTHER): Payer: Commercial Indemnity

## 2012-03-30 DIAGNOSIS — J309 Allergic rhinitis, unspecified: Secondary | ICD-10-CM

## 2012-04-18 ENCOUNTER — Other Ambulatory Visit: Payer: Self-pay | Admitting: Internal Medicine

## 2012-04-18 MED ORDER — FLUTICASONE PROPIONATE 50 MCG/ACT NA SUSP: 1.0000 | Freq: Every day | NASAL | Status: DC

## 2012-04-18 MED ORDER — FLUTICASONE-SALMETEROL 100-50 MCG/DOSE IN AEPB: 1.0000 | INHALATION_SPRAY | Freq: Two times a day (BID) | RESPIRATORY_TRACT | Status: DC

## 2012-09-05 ENCOUNTER — Other Ambulatory Visit: Payer: Self-pay | Admitting: Family Medicine

## 2012-09-05 DIAGNOSIS — Z1231 Encounter for screening mammogram for malignant neoplasm of breast: Secondary | ICD-10-CM

## 2012-10-16 ENCOUNTER — Ambulatory Visit
Admission: RE | Admit: 2012-10-16 | Discharge: 2012-10-16 | Disposition: A | Discharge status: Home / Self Care | Payer: PRIVATE HEALTH INSURANCE | Source: Ambulatory Visit | Attending: Family Medicine | Admitting: Family Medicine

## 2012-10-16 DIAGNOSIS — Z1231 Encounter for screening mammogram for malignant neoplasm of breast: Secondary | ICD-10-CM

## 2012-11-20 ENCOUNTER — Encounter: Payer: Self-pay | Admitting: Internal Medicine

## 2012-11-20 ENCOUNTER — Ambulatory Visit (INDEPENDENT_AMBULATORY_CARE_PROVIDER_SITE_OTHER): Payer: PRIVATE HEALTH INSURANCE | Admitting: Internal Medicine

## 2012-11-20 VITALS — BP 120/76 | HR 80 | Ht 62.0 in | Wt 184.0 lb

## 2012-11-20 DIAGNOSIS — J45909 Unspecified asthma, uncomplicated: Secondary | ICD-10-CM

## 2012-11-20 DIAGNOSIS — K219 Gastro-esophageal reflux disease without esophagitis: Secondary | ICD-10-CM

## 2012-11-20 DIAGNOSIS — J309 Allergic rhinitis, unspecified: Secondary | ICD-10-CM

## 2012-11-20 MED ORDER — AZELASTINE-FLUTICASONE 137-50 MCG/ACT NA SUSP: 2.0000 | Freq: Every day | NASAL | Status: DC

## 2012-11-20 MED ORDER — BUDESONIDE-FORMOTEROL FUMARATE 160-4.5 MCG/ACT IN AERO: 2.0000 | INHALATION_SPRAY | Freq: Two times a day (BID) | RESPIRATORY_TRACT | Status: DC

## 2012-11-20 NOTE — Progress Notes (Signed)
09/1809- 65 yoF never smoker followed for allergic rhinitis, asthma, complicated by GERD, HBP Last here-03/02/11   Since last here husband Molly Maduro "Elliot Gurney" died. If she misses allergy shot by a few days she is convinced she feels it and wants to continue. All summer into fall has had head congestion, some drainage, fullness in throat. In recent days has yellow cough, pressure left ear.  Denies fever, green, sore throat, swollen glands. Says this interacts and aggravates her reflux.  Dr Foy Guadalajara gave Zpak 2 mponths ago- little help.   11/20/12- 66 yoF never smoker followed for allergic rhinitis, asthma, complicated by GERD, HBP FOLLOWS FOR: still on vaccine and needs refill; also needs epipen refilled    WidowedMolly Maduro) Has had flu vaccine. Continues allergy vaccine 1:10 GO. Molds and dust her main triggers. Denies wheeze but has dry cough and sometimes propped up at night. Postnasal drip. Protonix not enough for heartburn.  ROS- see HPI Constitutional:   No-   weight loss, night sweats, fevers, chills, fatigue, lassitude. HEENT:   No-  headaches, difficulty swallowing, tooth/dental problems, sore throat,       No-  sneezing, itching, ear ache,    +nasal congestion, +post nasal drip,  CV:  No-   chest pain, orthopnea, PND, swelling in lower extremities, anasarca, dizziness, palpitations Resp: No-   shortness of breath with exertion or at rest.              No-  productive cough,  + non-productive cough,  No- coughing up of blood.              No-   change in color of mucus.  No- wheezing.   Skin: No-   rash or lesions. GI:  No-   +heartburn, indigestion, no-abdominal pain, nausea, vomiting, GU: MS:  No-   joint pain or swelling.   Neuro-     nothing unusual Psych:  No- change in mood or affect. Some depression or anxiety.  No memory loss.  OBJ General- Alert, Oriented, Affect-appropriate, Distress- none acute, overweight Skin- rash-none, lesions- none, excoriation- none Lymphadenopathy-  none Head- atraumatic            Eyes- Gross vision intact, PERRLA, conjunctivae clear secretions            Ears- Hearing, canals-normal- minor wax.+ Left TM may be retracted.            Nose- Clear, no-Septal dev, mucus, polyps, erosion, perforation             Throat- Mallampati II , mucosa clear , drainage- none, tonsils- atrophic   Not red Neck- flexible , trachea midline, no stridor , thyroid nl, carotid no bruit Chest - symmetrical excursion , unlabored           Heart/CV- RRR , no murmur , no gallop  , no rub, nl s1 s2                           - JVD- none , edema- none, stasis changes- none, varices- none           Lung- clear to P&A, no- cough, dullness-none, rub- none           Chest wall-  Abd-  Br/ Gen/ Rectal- Not done, not indicated Extrem- cyanosis- none, clubbing, none, atrophy- none, strength- nl Neuro- grossly intact to observation

## 2012-11-20 NOTE — Patient Instructions (Addendum)
Sample Dymista - try   1-2 puffs each nostril once daily at bedtime     Try this instead of flonase for awhile  Continue Protonix before breakfast  Add otc Pepcid/ famotadine at bedtime for additional acid blocker coverage  Sample Symbicort 160     2 puffs then rise mouth, twice daily     Try this instead of Advair for trial  We can continue allergy vaccine. See Tammy in the Allergy Lab to reorder.

## 2012-11-21 ENCOUNTER — Telehealth: Payer: Self-pay | Admitting: Internal Medicine

## 2012-11-21 NOTE — Telephone Encounter (Signed)
Pt aware that the documentation she seen on her AVS is where we deferred the flu shot.

## 2012-11-22 ENCOUNTER — Ambulatory Visit (INDEPENDENT_AMBULATORY_CARE_PROVIDER_SITE_OTHER): Payer: PRIVATE HEALTH INSURANCE

## 2012-11-22 DIAGNOSIS — J309 Allergic rhinitis, unspecified: Secondary | ICD-10-CM

## 2012-11-23 ENCOUNTER — Telehealth: Payer: Self-pay | Admitting: Internal Medicine

## 2012-11-23 NOTE — Telephone Encounter (Signed)
Message given to [employer] in allergy lab with response. Will forward to her to document.

## 2012-11-23 NOTE — Telephone Encounter (Signed)
Pt reports that she received allergy vaccine in mail and it is different color than previous vaccine.  Previous bottle was clear and this new one has a yellow tint.  Last ov doesn't show that vaccine was changed.  Pt advised not to take new vaccine until we check with Dr Maple Hudson.  Please advise.

## 2012-11-26 NOTE — Telephone Encounter (Signed)
I asked Melbourne Abts if she would look and see if there was another # Mrs.Mott' could be reached I had a xolair shot at the time. She was able to reach her and told her her vac.is fine & that the coca(preservative) was just darker.

## 2012-12-01 NOTE — Assessment & Plan Note (Signed)
Inadequate control Continue Protonix each morning. Add Pepcid at bedtime

## 2012-12-01 NOTE — Assessment & Plan Note (Addendum)
Generally very well controlled. Discussed possible contribution from reflux. Because of cost, try changing Advair to Symbicort

## 2012-12-01 NOTE — Assessment & Plan Note (Signed)
Plan-try sample Dymista nasal spray instead of Flonase for comparison

## 2012-12-04 ENCOUNTER — Telehealth: Payer: Self-pay | Admitting: Internal Medicine

## 2012-12-04 MED ORDER — EPINEPHRINE 0.3 MG/0.3ML IJ DEVI: INTRAMUSCULAR | Status: DC

## 2012-12-04 NOTE — Telephone Encounter (Signed)
Rx sent 

## 2012-12-07 ENCOUNTER — Telehealth: Payer: Self-pay | Admitting: Internal Medicine

## 2012-12-07 NOTE — Telephone Encounter (Signed)
Per CY-we do not get enough to do that; sorry. We can send Rx's

## 2012-12-07 NOTE — Telephone Encounter (Signed)
Pt was giving symbicort 160 and dymista. Please advise if okay to give samples thanks

## 2012-12-10 MED ORDER — BUDESONIDE-FORMOTEROL FUMARATE 160-4.5 MCG/ACT IN AERO: 2.0000 | INHALATION_SPRAY | Freq: Two times a day (BID) | RESPIRATORY_TRACT | Status: DC

## 2012-12-10 MED ORDER — AZELASTINE-FLUTICASONE 137-50 MCG/ACT NA SUSP: 2.0000 | Freq: Every day | NASAL | Status: DC

## 2012-12-10 NOTE — Telephone Encounter (Signed)
Spoke with pt and notified of recs per CDY Pt verbalized understanding and I have sent rxs in to her pharm Nothing further needed per pt

## 2012-12-14 ENCOUNTER — Telehealth: Payer: Self-pay | Admitting: Internal Medicine

## 2012-12-14 MED ORDER — AZELASTINE-FLUTICASONE 137-50 MCG/ACT NA SUSP: 2.0000 | Freq: Every day | NASAL | Status: DC

## 2012-12-14 MED ORDER — BUDESONIDE-FORMOTEROL FUMARATE 160-4.5 MCG/ACT IN AERO: 2.0000 | INHALATION_SPRAY | Freq: Two times a day (BID) | RESPIRATORY_TRACT | Status: DC

## 2012-12-14 NOTE — Telephone Encounter (Signed)
Rx's were re-sent. Pt is aware.

## 2013-03-06 DIAGNOSIS — N951 Menopausal and female climacteric states: Secondary | ICD-10-CM | POA: Diagnosis not present

## 2013-03-06 DIAGNOSIS — F339 Major depressive disorder, recurrent, unspecified: Secondary | ICD-10-CM | POA: Diagnosis not present

## 2013-03-06 DIAGNOSIS — M159 Polyosteoarthritis, unspecified: Secondary | ICD-10-CM | POA: Diagnosis not present

## 2013-03-06 DIAGNOSIS — M720 Palmar fascial fibromatosis [Dupuytren]: Secondary | ICD-10-CM | POA: Diagnosis not present

## 2013-03-06 DIAGNOSIS — I10 Essential (primary) hypertension: Secondary | ICD-10-CM | POA: Diagnosis not present

## 2013-04-01 DIAGNOSIS — G5600 Carpal tunnel syndrome, unspecified upper limb: Secondary | ICD-10-CM | POA: Diagnosis not present

## 2013-04-10 DIAGNOSIS — L255 Unspecified contact dermatitis due to plants, except food: Secondary | ICD-10-CM | POA: Diagnosis not present

## 2013-04-15 DIAGNOSIS — M940 Chondrocostal junction syndrome [Tietze]: Secondary | ICD-10-CM | POA: Diagnosis not present

## 2013-05-21 ENCOUNTER — Ambulatory Visit (INDEPENDENT_AMBULATORY_CARE_PROVIDER_SITE_OTHER): Payer: Medicare Other | Admitting: Internal Medicine

## 2013-05-21 ENCOUNTER — Encounter: Payer: Self-pay | Admitting: Internal Medicine

## 2013-05-21 VITALS — BP 108/62 | HR 79 | Ht 61.5 in | Wt 181.4 lb

## 2013-05-21 DIAGNOSIS — J45998 Other asthma: Secondary | ICD-10-CM

## 2013-05-21 DIAGNOSIS — J45909 Unspecified asthma, uncomplicated: Secondary | ICD-10-CM | POA: Diagnosis not present

## 2013-05-21 DIAGNOSIS — J3089 Other allergic rhinitis: Secondary | ICD-10-CM

## 2013-05-21 DIAGNOSIS — J309 Allergic rhinitis, unspecified: Secondary | ICD-10-CM | POA: Diagnosis not present

## 2013-05-21 MED ORDER — FLUTICASONE PROPIONATE 50 MCG/ACT NA SUSP: 1.0000 | Freq: Every day | NASAL | Status: DC

## 2013-05-21 MED ORDER — FLUTICASONE-SALMETEROL 100-50 MCG/DOSE IN AEPB: 1.0000 | INHALATION_SPRAY | Freq: Two times a day (BID) | RESPIRATORY_TRACT | Status: DC

## 2013-05-21 NOTE — Progress Notes (Signed)
09/1809- 65 yoF never smoker followed for allergic rhinitis, asthma, complicated by GERD, HBP Last here-03/02/11   Since last here husband Molly Maduro "Elliot Gurney" died. If she misses allergy shot by a few days she is convinced she feels it and wants to continue. All summer into fall has had head congestion, some drainage, fullness in throat. In recent days has yellow cough, pressure left ear.  Denies fever, green, sore throat, swollen glands. Says this interacts and aggravates her reflux.  Dr Foy Guadalajara gave Zpak 2 months ago- little help.   11/20/12- 66 yoF never smoker followed for allergic rhinitis, asthma, complicated by GERD, HBP FOLLOWS FOR: ran out of allergy vaccine with last shot last week-pt states she cant tell if vaccine is helping or not. More head congestion this spring. Dymista not better than Flonase. Has been doing a lot of yard work. She used up the last of her allergy vaccine. We had discussion of goals, risks, benefits and expectations, agreeing to stay off for observation now. Asthma control has been good, sensitive to strong odors.  ROS- see HPI Constitutional:   No-   weight loss, night sweats, fevers, chills, fatigue, lassitude. HEENT:   No-  headaches, difficulty swallowing, tooth/dental problems, sore throat,       No-  sneezing, itching, ear ache,    +nasal congestion, no-post nasal drip,  CV:  No-   chest pain, orthopnea, PND, swelling in lower extremities, anasarca, dizziness, palpitations Resp: No-   shortness of breath with exertion or at rest.              No-  productive cough,  + non-productive cough,  No- coughing up of blood.              No-   change in color of mucus.  No- wheezing.   Skin: No-   rash or lesions. GI:  No-   +heartburn, indigestion, no-abdominal pain, nausea, vomiting, GU: MS: +  joint pain or swelling.   Neuro-     nothing unusual Psych:  No- change in mood or affect. No- depression or anxiety.  No memory loss.  OBJ General- Alert, Oriented,  Affect-appropriate, Distress- none acute, overweight Skin- rash-none, lesions- none, excoriation- none Lymphadenopathy- none Head- atraumatic            Eyes- Gross vision intact, PERRLA, conjunctivae clear secretions            Ears- Hearing, canals-normal- minor wax.            Nose- Clear, no-Septal dev, mucus, polyps, erosion, perforation             Throat- Mallampati II , mucosa clear , drainage- none, tonsils- atrophic    Neck- flexible , trachea midline, no stridor , thyroid nl, carotid no bruit Chest - symmetrical excursion , unlabored           Heart/CV- RRR , no murmur , no gallop  , no rub, nl s1 s2                           - JVD- none , edema- none, stasis changes- none, varices- none           Lung- clear to P&A, no- cough, dullness-none, rub- none           Chest wall-  Abd-  Br/ Gen/ Rectal- Not done, not indicated Extrem- cyanosis- none, clubbing, none, atrophy- none, strength- nl. Osteoarthritic deformity of fingers.  Neuro- grossly intact to observation

## 2013-05-21 NOTE — Patient Instructions (Addendum)
We will stop allergy vaccine now and watch to see how you do.  \Ok to use otc antihistamine as needed  Refills for fluticasone and for Advair were sent.

## 2013-06-02 NOTE — Assessment & Plan Note (Signed)
We're stopping allergy vaccine. She will continue Flonase

## 2013-06-02 NOTE — Assessment & Plan Note (Signed)
We are of comfortable stopping allergy vaccine now for observation. Medications reviewed.

## 2013-06-11 DIAGNOSIS — N951 Menopausal and female climacteric states: Secondary | ICD-10-CM | POA: Diagnosis not present

## 2013-06-11 DIAGNOSIS — H9190 Unspecified hearing loss, unspecified ear: Secondary | ICD-10-CM | POA: Diagnosis not present

## 2013-06-11 DIAGNOSIS — IMO0002 Reserved for concepts with insufficient information to code with codable children: Secondary | ICD-10-CM | POA: Diagnosis not present

## 2013-06-24 DIAGNOSIS — H9190 Unspecified hearing loss, unspecified ear: Secondary | ICD-10-CM | POA: Diagnosis not present

## 2013-06-24 DIAGNOSIS — H8090 Unspecified otosclerosis, unspecified ear: Secondary | ICD-10-CM | POA: Diagnosis not present

## 2013-06-24 DIAGNOSIS — H902 Conductive hearing loss, unspecified: Secondary | ICD-10-CM | POA: Diagnosis not present

## 2013-07-26 DIAGNOSIS — F411 Generalized anxiety disorder: Secondary | ICD-10-CM | POA: Diagnosis not present

## 2013-07-26 DIAGNOSIS — M25519 Pain in unspecified shoulder: Secondary | ICD-10-CM | POA: Diagnosis not present

## 2013-09-09 DIAGNOSIS — H8090 Unspecified otosclerosis, unspecified ear: Secondary | ICD-10-CM | POA: Diagnosis not present

## 2013-09-09 DIAGNOSIS — H902 Conductive hearing loss, unspecified: Secondary | ICD-10-CM | POA: Diagnosis not present

## 2013-09-24 DIAGNOSIS — Z23 Encounter for immunization: Secondary | ICD-10-CM | POA: Diagnosis not present

## 2013-09-30 DIAGNOSIS — R42 Dizziness and giddiness: Secondary | ICD-10-CM | POA: Diagnosis not present

## 2013-09-30 DIAGNOSIS — J019 Acute sinusitis, unspecified: Secondary | ICD-10-CM | POA: Diagnosis not present

## 2013-10-18 DIAGNOSIS — N390 Urinary tract infection, site not specified: Secondary | ICD-10-CM | POA: Diagnosis not present

## 2013-10-18 DIAGNOSIS — R350 Frequency of micturition: Secondary | ICD-10-CM | POA: Diagnosis not present

## 2013-11-20 ENCOUNTER — Ambulatory Visit (INDEPENDENT_AMBULATORY_CARE_PROVIDER_SITE_OTHER): Payer: Medicare Other | Admitting: Internal Medicine

## 2013-11-20 ENCOUNTER — Encounter: Payer: Self-pay | Admitting: Internal Medicine

## 2013-11-20 VITALS — BP 116/70 | HR 87 | Ht 61.5 in | Wt 184.0 lb

## 2013-11-20 DIAGNOSIS — J45909 Unspecified asthma, uncomplicated: Secondary | ICD-10-CM | POA: Diagnosis not present

## 2013-11-20 DIAGNOSIS — Z23 Encounter for immunization: Secondary | ICD-10-CM

## 2013-11-20 DIAGNOSIS — J45998 Other asthma: Secondary | ICD-10-CM

## 2013-11-20 DIAGNOSIS — J309 Allergic rhinitis, unspecified: Secondary | ICD-10-CM | POA: Diagnosis not present

## 2013-11-20 DIAGNOSIS — J302 Other seasonal allergic rhinitis: Secondary | ICD-10-CM

## 2013-11-20 NOTE — Progress Notes (Signed)
[birth year]11- 65 yoF never smoker followed for allergic rhinitis, asthma, complicated by GERD, HBP Last here-03/02/11   Since last here husband Molly Maduro "Elliot Gurney" died. If she misses allergy shot by a few days she is convinced she feels it and wants to continue. All summer into fall has had head congestion, some drainage, fullness in throat. In recent days has yellow cough, pressure left ear.  Denies fever, green, sore throat, swollen glands. Says this interacts and aggravates her reflux.  Dr Foy Guadalajara gave Zpak 2 months ago- little help.   11/20/12- 66 yoF never smoker followed for allergic rhinitis, asthma, complicated by GERD, HBP FOLLOWS FOR: ran out of allergy vaccine with last shot last week-pt states she cant tell if vaccine is helping or not. More head congestion this spring. Dymista not better than Flonase. Has been doing a lot of yard work. She used up the last of her allergy vaccine. We had discussion of goals, risks, benefits and expectations, agreeing to stay off for observation now. Asthma control has been good, sensitive to strong odors.  11/20/13- 66 yoF never smoker followed for allergic rhinitis, asthma, complicated by GERD, HBP FOLLOWS FOR:has been off vaccine; recently had slight flares from oak trees in yard and brother with 2 cats (indoors) moved in. We had stopped allergy vaccine. Minor sneezing when outdoors. Brother and 2 cats just moved in. Environmental precautions discussed. Irritants/strong odors are also a trigger.  ROS- see HPI Constitutional:   No-   weight loss, night sweats, fevers, chills, fatigue, lassitude. HEENT:   No-  headaches, difficulty swallowing, tooth/dental problems, sore throat,       No-  sneezing, itching, ear ache,    +nasal congestion, no-post nasal drip,  CV:  No-   chest pain, orthopnea, PND, swelling in lower extremities, anasarca, dizziness, palpitations Resp: No-   shortness of breath with exertion or at rest.              No-  productive cough,  +  non-productive cough,  No- coughing up of blood.              No-   change in color of mucus.  No- wheezing.   Skin: No-   rash or lesions. GI:  No-   +heartburn, indigestion, no-abdominal pain, nausea, vomiting, GU: MS: +  joint pain or swelling.   Neuro-     nothing unusual Psych:  No- change in mood or affect. No- depression or anxiety.  No memory loss.  OBJ General- Alert, Oriented, Affect-appropriate, Distress- none acute, overweight Skin- rash-none, lesions- none, excoriation- none Lymphadenopathy- none Head- atraumatic            Eyes- Gross vision intact, PERRLA, conjunctivae clear secretions            Ears- Hearing, canals-normal- minor wax.            Nose- +turbinate edema, no-Septal dev, mucus, polyps, erosion, perforation             Throat- Mallampati II , mucosa clear , drainage- none, tonsils- atrophic    Neck- flexible , trachea midline, no stridor , thyroid nl, carotid no bruit Chest - symmetrical excursion , unlabored           Heart/CV- RRR , no murmur , no gallop  , no rub, nl s1 s2                           - JVD-  none , edema- none, stasis changes- none, varices- none           Lung- clear to P&A,  Cough- dry, dullness-none, rub- none           Chest wall-  Abd-  Br/ Gen/ Rectal- Not done, not indicated Extrem- cyanosis- none, clubbing, none, atrophy- none, strength- nl. Osteoarthritic deformity of fingers. Neuro- grossly intact to observation

## 2013-11-20 NOTE — Patient Instructions (Signed)
Call for refills as needed  Pneumonia conjugate vaccine 13

## 2013-12-02 DIAGNOSIS — N951 Menopausal and female climacteric states: Secondary | ICD-10-CM | POA: Diagnosis not present

## 2013-12-02 DIAGNOSIS — J3489 Other specified disorders of nose and nasal sinuses: Secondary | ICD-10-CM | POA: Diagnosis not present

## 2013-12-02 DIAGNOSIS — M25519 Pain in unspecified shoulder: Secondary | ICD-10-CM | POA: Diagnosis not present

## 2013-12-02 DIAGNOSIS — IMO0002 Reserved for concepts with insufficient information to code with codable children: Secondary | ICD-10-CM | POA: Diagnosis not present

## 2013-12-10 NOTE — Assessment & Plan Note (Signed)
Obvious potential problem with her brothers cats in the home. We discussed management. Plan-pneumonia conjugate 13 vaccine. Medication refills as needed

## 2013-12-10 NOTE — Assessment & Plan Note (Signed)
Off allergy vaccine. Watch effect of cats in the home now.

## 2013-12-16 DIAGNOSIS — M25519 Pain in unspecified shoulder: Secondary | ICD-10-CM | POA: Diagnosis not present

## 2013-12-20 DIAGNOSIS — M25519 Pain in unspecified shoulder: Secondary | ICD-10-CM | POA: Diagnosis not present

## 2013-12-31 DIAGNOSIS — M25519 Pain in unspecified shoulder: Secondary | ICD-10-CM | POA: Diagnosis not present

## 2014-01-02 DIAGNOSIS — M25519 Pain in unspecified shoulder: Secondary | ICD-10-CM | POA: Diagnosis not present

## 2014-01-06 DIAGNOSIS — J069 Acute upper respiratory infection, unspecified: Secondary | ICD-10-CM | POA: Diagnosis not present

## 2014-01-13 DIAGNOSIS — M25519 Pain in unspecified shoulder: Secondary | ICD-10-CM | POA: Diagnosis not present

## 2014-01-14 DIAGNOSIS — I10 Essential (primary) hypertension: Secondary | ICD-10-CM | POA: Diagnosis not present

## 2014-01-14 DIAGNOSIS — M19079 Primary osteoarthritis, unspecified ankle and foot: Secondary | ICD-10-CM | POA: Diagnosis not present

## 2014-01-14 DIAGNOSIS — M19049 Primary osteoarthritis, unspecified hand: Secondary | ICD-10-CM | POA: Diagnosis not present

## 2014-01-14 DIAGNOSIS — E669 Obesity, unspecified: Secondary | ICD-10-CM | POA: Diagnosis not present

## 2014-01-14 DIAGNOSIS — E039 Hypothyroidism, unspecified: Secondary | ICD-10-CM | POA: Diagnosis not present

## 2014-01-14 DIAGNOSIS — E7800 Pure hypercholesterolemia, unspecified: Secondary | ICD-10-CM | POA: Diagnosis not present

## 2014-01-14 DIAGNOSIS — Z0000 Encounter for general adult medical examination without abnormal findings: Secondary | ICD-10-CM | POA: Diagnosis not present

## 2014-01-14 DIAGNOSIS — J45909 Unspecified asthma, uncomplicated: Secondary | ICD-10-CM | POA: Diagnosis not present

## 2014-01-16 DIAGNOSIS — E7800 Pure hypercholesterolemia, unspecified: Secondary | ICD-10-CM | POA: Diagnosis not present

## 2014-01-16 DIAGNOSIS — I10 Essential (primary) hypertension: Secondary | ICD-10-CM | POA: Diagnosis not present

## 2014-01-16 DIAGNOSIS — E039 Hypothyroidism, unspecified: Secondary | ICD-10-CM | POA: Diagnosis not present

## 2014-01-27 ENCOUNTER — Other Ambulatory Visit: Payer: Self-pay

## 2014-01-27 DIAGNOSIS — Z1231 Encounter for screening mammogram for malignant neoplasm of breast: Secondary | ICD-10-CM

## 2014-01-28 DIAGNOSIS — H40059 Ocular hypertension, unspecified eye: Secondary | ICD-10-CM | POA: Diagnosis not present

## 2014-02-04 DIAGNOSIS — M25519 Pain in unspecified shoulder: Secondary | ICD-10-CM | POA: Diagnosis not present

## 2014-02-09 DIAGNOSIS — J111 Influenza due to unidentified influenza virus with other respiratory manifestations: Secondary | ICD-10-CM | POA: Diagnosis not present

## 2014-02-14 ENCOUNTER — Ambulatory Visit: Payer: Medicare Other

## 2014-02-14 DIAGNOSIS — R059 Cough, unspecified: Secondary | ICD-10-CM | POA: Diagnosis not present

## 2014-02-14 DIAGNOSIS — R05 Cough: Secondary | ICD-10-CM | POA: Diagnosis not present

## 2014-02-25 DIAGNOSIS — H40059 Ocular hypertension, unspecified eye: Secondary | ICD-10-CM | POA: Diagnosis not present

## 2014-02-26 DIAGNOSIS — M810 Age-related osteoporosis without current pathological fracture: Secondary | ICD-10-CM | POA: Diagnosis not present

## 2014-02-26 DIAGNOSIS — Z780 Asymptomatic menopausal state: Secondary | ICD-10-CM | POA: Diagnosis not present

## 2014-03-04 ENCOUNTER — Ambulatory Visit
Admission: RE | Admit: 2014-03-04 | Discharge: 2014-03-04 | Disposition: A | Discharge status: Home / Self Care | Payer: Medicare Other | Source: Ambulatory Visit

## 2014-03-04 DIAGNOSIS — Z1231 Encounter for screening mammogram for malignant neoplasm of breast: Secondary | ICD-10-CM | POA: Diagnosis not present

## 2014-03-06 ENCOUNTER — Telehealth: Payer: Self-pay | Admitting: Internal Medicine

## 2014-03-06 MED ORDER — ALBUTEROL SULFATE HFA 108 (90 BASE) MCG/ACT IN AERS: 2.0000 | INHALATION_SPRAY | Freq: Four times a day (QID) | RESPIRATORY_TRACT | Status: DC

## 2014-03-06 NOTE — Telephone Encounter (Signed)
I have sent Rx refill to pharmacy. Pt aware of Rx sent.

## 2014-03-10 DIAGNOSIS — R059 Cough, unspecified: Secondary | ICD-10-CM | POA: Diagnosis not present

## 2014-03-10 DIAGNOSIS — K219 Gastro-esophageal reflux disease without esophagitis: Secondary | ICD-10-CM | POA: Diagnosis not present

## 2014-03-10 DIAGNOSIS — N951 Menopausal and female climacteric states: Secondary | ICD-10-CM | POA: Diagnosis not present

## 2014-03-10 DIAGNOSIS — J45909 Unspecified asthma, uncomplicated: Secondary | ICD-10-CM | POA: Diagnosis not present

## 2014-03-10 DIAGNOSIS — R05 Cough: Secondary | ICD-10-CM | POA: Diagnosis not present

## 2014-04-01 DIAGNOSIS — H401390 Pigmentary glaucoma, unspecified eye, stage unspecified: Secondary | ICD-10-CM | POA: Diagnosis not present

## 2014-04-16 DIAGNOSIS — E559 Vitamin D deficiency, unspecified: Secondary | ICD-10-CM | POA: Diagnosis not present

## 2014-04-30 DIAGNOSIS — H401390 Pigmentary glaucoma, unspecified eye, stage unspecified: Secondary | ICD-10-CM | POA: Diagnosis not present

## 2014-05-05 DIAGNOSIS — M7120 Synovial cyst of popliteal space [Baker], unspecified knee: Secondary | ICD-10-CM | POA: Diagnosis not present

## 2014-05-05 DIAGNOSIS — M1710 Unilateral primary osteoarthritis, unspecified knee: Secondary | ICD-10-CM | POA: Diagnosis not present

## 2014-05-26 DIAGNOSIS — M1710 Unilateral primary osteoarthritis, unspecified knee: Secondary | ICD-10-CM | POA: Diagnosis not present

## 2014-06-02 DIAGNOSIS — M1710 Unilateral primary osteoarthritis, unspecified knee: Secondary | ICD-10-CM | POA: Diagnosis not present

## 2014-06-06 ENCOUNTER — Other Ambulatory Visit: Payer: Self-pay | Admitting: Internal Medicine

## 2014-06-10 DIAGNOSIS — IMO0002 Reserved for concepts with insufficient information to code with codable children: Secondary | ICD-10-CM | POA: Diagnosis not present

## 2014-06-25 DIAGNOSIS — M19079 Primary osteoarthritis, unspecified ankle and foot: Secondary | ICD-10-CM | POA: Diagnosis not present

## 2014-06-25 DIAGNOSIS — M25569 Pain in unspecified knee: Secondary | ICD-10-CM | POA: Diagnosis not present

## 2014-07-31 DIAGNOSIS — H401390 Pigmentary glaucoma, unspecified eye, stage unspecified: Secondary | ICD-10-CM | POA: Diagnosis not present

## 2014-08-04 DIAGNOSIS — M25569 Pain in unspecified knee: Secondary | ICD-10-CM | POA: Diagnosis not present

## 2014-08-13 DIAGNOSIS — K5730 Diverticulosis of large intestine without perforation or abscess without bleeding: Secondary | ICD-10-CM | POA: Diagnosis not present

## 2014-08-13 DIAGNOSIS — Z800 Family history of malignant neoplasm of digestive organs: Secondary | ICD-10-CM | POA: Diagnosis not present

## 2014-08-13 DIAGNOSIS — Z1211 Encounter for screening for malignant neoplasm of colon: Secondary | ICD-10-CM | POA: Diagnosis not present

## 2014-08-18 DIAGNOSIS — M2240 Chondromalacia patellae, unspecified knee: Secondary | ICD-10-CM | POA: Diagnosis not present

## 2014-08-18 DIAGNOSIS — S83289A Other tear of lateral meniscus, current injury, unspecified knee, initial encounter: Secondary | ICD-10-CM | POA: Diagnosis not present

## 2014-08-18 DIAGNOSIS — G8918 Other acute postprocedural pain: Secondary | ICD-10-CM | POA: Diagnosis not present

## 2014-08-18 DIAGNOSIS — M23329 Other meniscus derangements, posterior horn of medial meniscus, unspecified knee: Secondary | ICD-10-CM | POA: Diagnosis not present

## 2014-08-28 DIAGNOSIS — H401390 Pigmentary glaucoma, unspecified eye, stage unspecified: Secondary | ICD-10-CM | POA: Diagnosis not present

## 2014-09-08 DIAGNOSIS — M47817 Spondylosis without myelopathy or radiculopathy, lumbosacral region: Secondary | ICD-10-CM | POA: Diagnosis not present

## 2014-09-08 DIAGNOSIS — M19079 Primary osteoarthritis, unspecified ankle and foot: Secondary | ICD-10-CM | POA: Diagnosis not present

## 2014-10-07 ENCOUNTER — Other Ambulatory Visit: Payer: Self-pay | Admitting: Internal Medicine

## 2014-10-10 DIAGNOSIS — J309 Allergic rhinitis, unspecified: Secondary | ICD-10-CM | POA: Diagnosis not present

## 2014-10-10 DIAGNOSIS — Z23 Encounter for immunization: Secondary | ICD-10-CM | POA: Diagnosis not present

## 2014-10-14 DIAGNOSIS — H1013 Acute atopic conjunctivitis, bilateral: Secondary | ICD-10-CM | POA: Diagnosis not present

## 2014-10-29 DIAGNOSIS — J069 Acute upper respiratory infection, unspecified: Secondary | ICD-10-CM | POA: Diagnosis not present

## 2014-10-29 DIAGNOSIS — R05 Cough: Secondary | ICD-10-CM | POA: Diagnosis not present

## 2014-10-29 DIAGNOSIS — J45991 Cough variant asthma: Secondary | ICD-10-CM | POA: Diagnosis not present

## 2014-10-29 DIAGNOSIS — Z1389 Encounter for screening for other disorder: Secondary | ICD-10-CM | POA: Diagnosis not present

## 2014-10-31 ENCOUNTER — Encounter: Payer: Self-pay | Admitting: Internal Medicine

## 2014-11-03 DIAGNOSIS — J309 Allergic rhinitis, unspecified: Secondary | ICD-10-CM | POA: Diagnosis not present

## 2014-11-03 DIAGNOSIS — J45991 Cough variant asthma: Secondary | ICD-10-CM | POA: Diagnosis not present

## 2014-11-03 DIAGNOSIS — R05 Cough: Secondary | ICD-10-CM | POA: Diagnosis not present

## 2014-11-03 DIAGNOSIS — K219 Gastro-esophageal reflux disease without esophagitis: Secondary | ICD-10-CM | POA: Diagnosis not present

## 2014-11-06 ENCOUNTER — Ambulatory Visit (INDEPENDENT_AMBULATORY_CARE_PROVIDER_SITE_OTHER): Payer: Medicare Other | Admitting: Podiatry

## 2014-11-06 ENCOUNTER — Ambulatory Visit (INDEPENDENT_AMBULATORY_CARE_PROVIDER_SITE_OTHER): Payer: Medicare Other

## 2014-11-06 ENCOUNTER — Encounter: Payer: Self-pay | Admitting: Podiatry

## 2014-11-06 ENCOUNTER — Ambulatory Visit: Payer: Self-pay | Admitting: Podiatry

## 2014-11-06 VITALS — BP 136/72 | HR 95 | Resp 16 | Ht 62.0 in | Wt 182.0 lb

## 2014-11-06 DIAGNOSIS — M2042 Other hammer toe(s) (acquired), left foot: Secondary | ICD-10-CM | POA: Diagnosis not present

## 2014-11-06 DIAGNOSIS — M2010 Hallux valgus (acquired), unspecified foot: Secondary | ICD-10-CM | POA: Diagnosis not present

## 2014-11-06 NOTE — Patient Instructions (Signed)
Pre-Operative Instructions  Congratulations, you have decided to take an important step to improving your quality of life.  You can be assured that the doctors of [facility] will be with you every step of the way.  1. Plan to be at the surgery center/hospital at least 1 (one) hour prior to your scheduled time unless otherwise directed by the surgical center/hospital staff.  You must have a responsible adult accompany you, remain during the surgery and drive you home.  Make sure you have directions to the surgical center/hospital and know how to get there on time. 2. For hospital based surgery you will need to obtain a history and physical form from your family physician within 1 month prior to the date of surgery- we will give you a form for you primary physician.  3. We make every effort to accommodate the date you request for surgery.  There are however, times where surgery dates or times have to be moved.  We will contact you as soon as possible if a change in schedule is required.   4. No Aspirin/Ibuprofen for one week before surgery.  If you are on aspirin, any non-steroidal anti-inflammatory medications (Mobic, Aleve, Ibuprofen) you should stop taking it 7 days prior to your surgery.  You make take Tylenol  For pain prior to surgery.  5. Medications- If you are taking daily heart and blood pressure medications, seizure, reflux, allergy, asthma, anxiety, pain or diabetes medications, make sure the surgery center/hospital is aware before the day of surgery so they may notify you which medications to take or avoid the day of surgery. 6. No food or drink after midnight the night before surgery unless directed otherwise by surgical center/hospital staff. 7. No alcoholic beverages 24 hours prior to surgery.  No smoking 24 hours prior to or 24 hours after surgery. 8. Wear loose pants or shorts- loose enough to fit over bandages, boots, and casts. 9. No slip on shoes, sneakers are best. 10. Bring  your boot with you to the surgery center/hospital.  Also bring crutches or a walker if your physician has prescribed it for you.  If you do not have this equipment, it will be provided for you after surgery. 11. If you have not been contracted by the surgery center/hospital by the day before your surgery, call to confirm the date and time of your surgery. 12. Leave-time from work may vary depending on the type of surgery you have.  Appropriate arrangements should be made prior to surgery with your employer. 13. Prescriptions will be provided immediately following surgery by your doctor.  Have these filled as soon as possible after surgery and take the medication as directed. 14. Remove nail polish on the operative foot. 15. Wash the night before surgery.  The night before surgery wash the foot and leg well with the antibacterial soap provided and water paying special attention to beneath the toenails and in between the toes.  Rinse thoroughly with water and dry well with a towel.  Perform this wash unless told not to do so by your physician.  Enclosed: 1 Ice pack (please put in freezer the night before surgery)   1 Hibiclens skin cleaner   Pre-op Instructions  If you have any questions regarding the instructions, do not hesitate to call our office.  [place]: [address] [place], [place] [address] [phone]  [place]: [address] , [place] [address] [phone]  [place]: [address]  [place], [place] [address] [phone]  Dr. Richard   Tuchman DPM, Dr. Norman Regal DPM Dr. Richard Sikora DPM, Dr. M. Todd Hyatt DPM, Dr. Kathryn Egerton DPM 

## 2014-11-06 NOTE — Progress Notes (Signed)
   Subjective:    Patient ID: [name], female    DOB: [birth year], 68 y.o.   MRN: [id number]  HPI Comments: Had a bunion on the right foot and surgery on it in feb 2010 and than they had to go back in and put a rod in my foot and now the toes are growing over the other toes.  Spots on the bottom of my feet.  callused lesions. Top of the left foot aches, arthritis      Review of Systems  All other systems reviewed and are negative.      Objective:   Physical Exam: I have reviewed her past medical history medications allergies surgery social history and review of systems. Pulses are strongly palpable bilateral. Neurologic sensorium is intact persons once the monofilament. Deep tendon reflexes are intact bilateral and muscle strength is 5 over 5 dorsiflexors plantar flexors and inverters and everters all intrinsic musculature is intact. Orthopedic evaluation demonstrates left foot: Hallux abductovalgus deformity which appears to be tracking but not yet track bound. Prominent second and third metatarsal phalangeal joints with hammertoe deformities flexible in nature at the PIPJ's rigid at the DIPJ's #2 #3 and #4 of the left foot. Right foot: Demonstrates a severely shortened first metatarsal status post Lapidus procedure. This procedure obviously failed with the hallux remaining an adductovarus rotation and abducted. Elongated second and third metatarsals with hammertoes noted. Pes planus is also noted bilaterally. 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  Plan: Discussed the etiology pathology conservative versus surgical therapies. At this point she is requesting surgical  intervention due to the consistent unrelenting pain of the first metatarsophalangeal joint and second metatarsal with hammertoe left foot. She signed a surgical consent today which included an Austin bunion repair with screw fixation second metatarsal osteotomy and hammertoe repaired with AP and and/or a screw. We went over the consistent line by line number by number giving her time to ask questions she saw fit. I answered all questions regarding these procedures to the best of my ability in layman's terms. She understands that there is a possibility recurrence. We discussed the possible postop complications which may include but are not limited to postop pain bleeding swelling infection recurrence need for further surgery loss of digit loss of limb loss of life. She was dispensed a cam walker and I will follow-up with her in the near future for surgical intervention after she is cleared by her medical doctor.

## 2014-11-20 ENCOUNTER — Ambulatory Visit (INDEPENDENT_AMBULATORY_CARE_PROVIDER_SITE_OTHER): Payer: Medicare Other | Admitting: Internal Medicine

## 2014-11-20 ENCOUNTER — Other Ambulatory Visit: Payer: Medicare Other

## 2014-11-20 ENCOUNTER — Encounter: Payer: Self-pay | Admitting: Internal Medicine

## 2014-11-20 ENCOUNTER — Ambulatory Visit (INDEPENDENT_AMBULATORY_CARE_PROVIDER_SITE_OTHER)
Admission: RE | Admit: 2014-11-20 | Discharge: 2014-11-20 | Disposition: A | Discharge status: Home / Self Care | Payer: Medicare Other | Source: Ambulatory Visit | Attending: Internal Medicine | Admitting: Internal Medicine

## 2014-11-20 VITALS — BP 136/78 | HR 85 | Ht 61.5 in | Wt 185.8 lb

## 2014-11-20 DIAGNOSIS — J302 Other seasonal allergic rhinitis: Secondary | ICD-10-CM

## 2014-11-20 DIAGNOSIS — K219 Gastro-esophageal reflux disease without esophagitis: Secondary | ICD-10-CM | POA: Diagnosis not present

## 2014-11-20 DIAGNOSIS — R0600 Dyspnea, unspecified: Secondary | ICD-10-CM | POA: Diagnosis not present

## 2014-11-20 DIAGNOSIS — J4541 Moderate persistent asthma with (acute) exacerbation: Secondary | ICD-10-CM

## 2014-11-20 DIAGNOSIS — J45998 Other asthma: Secondary | ICD-10-CM

## 2014-11-20 DIAGNOSIS — J309 Allergic rhinitis, unspecified: Secondary | ICD-10-CM | POA: Diagnosis not present

## 2014-11-20 DIAGNOSIS — J45901 Unspecified asthma with (acute) exacerbation: Secondary | ICD-10-CM | POA: Diagnosis not present

## 2014-11-20 DIAGNOSIS — R05 Cough: Secondary | ICD-10-CM | POA: Diagnosis not present

## 2014-11-20 DIAGNOSIS — J3089 Other allergic rhinitis: Secondary | ICD-10-CM

## 2014-11-20 MED ORDER — BUDESONIDE 0.25 MG/2ML IN SUSP: 0.2500 mg | Freq: Two times a day (BID) | RESPIRATORY_TRACT | Status: DC

## 2014-11-20 MED ORDER — ALBUTEROL SULFATE HFA 108 (90 BASE) MCG/ACT IN AERS: 2.0000 | INHALATION_SPRAY | RESPIRATORY_TRACT | Status: DC | PRN

## 2014-11-20 MED ORDER — ALBUTEROL SULFATE (2.5 MG/3ML) 0.083% IN NEBU: 2.5000 mg | INHALATION_SOLUTION | Freq: Four times a day (QID) | RESPIRATORY_TRACT | Status: DC | PRN

## 2014-11-20 MED ORDER — FLUTICASONE-SALMETEROL 250-50 MCG/DOSE IN AEPB: INHALATION_SPRAY | RESPIRATORY_TRACT | Status: DC

## 2014-11-20 NOTE — Patient Instructions (Signed)
Try elevating the head of your bed with a brick under each head leg. This reduces reflux at night.  Scripts sent for the albuterol rescue inhaler, albuterol and budesonide for your nebulizer machine.  Be sure the medicine you have at home for the machine is albuterol, and not ipratropium-albuterol (ipratropium might bother your glaucoma)  Order- CXR   Dx asthma, moderate exacerbation  Order lab- Allergy profile, Mold IgE panel

## 2014-11-20 NOTE — Progress Notes (Signed)
[birth year]11- 65 yoF never smoker followed for allergic rhinitis, asthma, complicated by GERD, HBP Last here-03/02/11   Since last here husband [name] "[name]" died. If she misses allergy shot by a few days she is convinced she feels it and wants to continue. All summer into fall has had head congestion, some drainage, fullness in throat. In recent days has yellow cough, pressure left ear.  Denies fever, green, sore throat, swollen glands. Says this interacts and aggravates her reflux.  Dr Maceo Pro gave Zpak 2 months ago- little help.   11/20/12- 66 yoF never smoker followed for allergic rhinitis, asthma, complicated by GERD, HBP FOLLOWS FOR: ran out of allergy vaccine with last shot last week-pt states she cant tell if vaccine is helping or not. More head congestion this spring. Dymista not better than Flonase. Has been doing a lot of yard work. She used up the last of her allergy vaccine. We had discussion of goals, risks, benefits and expectations, agreeing to stay off for observation now. Asthma control has been good, sensitive to strong odors.  11/20/13- 66 yoF never smoker followed for allergic rhinitis, asthma, complicated by GERD, HBP FOLLOWS FOR:has been off vaccine; recently had slight flares from oak trees in yard and brother with 2 cats (indoors) moved in. We had stopped allergy vaccine. Minor sneezing when outdoors. Brother and 2 cats just moved in. Environmental precautions discussed. Irritants/strong odors are also a trigger.  11/20/14-68 yoF never smoker followed for allergic rhinitis, asthma, complicated by GERD, HBP, glaucoma FOLLOWS FOR: Had a hard time through last winter and summer; has GERD and this isnt helping her asthma much at all. Trying to avoid steroids because of her glaucoma. Budesonide has been added to her nebulizer medicine Getting home mold assessment done  ROS- see HPI Constitutional:   No-   weight loss, night sweats, fevers, chills, fatigue, lassitude. HEENT:   No-   headaches, difficulty swallowing, tooth/dental problems, sore throat,       No-  sneezing, itching, ear ache,    +nasal congestion, no-post nasal drip,  CV:  No-   chest pain, orthopnea, PND, swelling in lower extremities, anasarca, dizziness, palpitations Resp: No-   shortness of breath with exertion or at rest.              No-  productive cough,  + non-productive cough,  No- coughing up of blood.              No-   change in color of mucus.  + wheezing.   Skin: No-   rash or lesions. GI:  No-   +heartburn, indigestion, no-abdominal pain, nausea, vomiting, GU: MS: +  joint pain or swelling.   Neuro-     nothing unusual Psych:  No- change in mood or affect. No- depression or anxiety.  No memory loss.  OBJ General- Alert, Oriented, Affect-appropriate, Distress- none acute, overweight Skin- rash-none, lesions- none, excoriation- none Lymphadenopathy- none Head- atraumatic            Eyes- Gross vision intact, PERRLA, conjunctivae clear secretions            Ears- Hearing, canals-normal- minor wax.            Nose- +turbinate edema, no-Septal dev, mucus, polyps, erosion, perforation             Throat- Mallampati II , mucosa clear , drainage- none, tonsils- atrophic    Neck- flexible , trachea midline, no stridor , thyroid nl, carotid no bruit Chest -  symmetrical excursion , unlabored           Heart/CV- RRR , no murmur , no gallop  , no rub, nl s1 s2                           - JVD- none , edema- none, stasis changes- none, varices- none           Lung- clear to P&A,  Cough- none, dullness-none, rub- none           Chest wall-  Abd-  Br/ Gen/ Rectal- Not done, not indicated Extrem- cyanosis- none, clubbing, none, atrophy- none, strength- nl.                                     + Osteoarthritic deformity of fingers. Neuro- grossly intact to observation

## 2014-11-21 LAB — ALLERGY FULL PROFILE
Allergen,Goose feathers, e70: <0.1 kU/L
Aspergillus fumigatus, m3: <0.1 kU/L
Bermuda Grass: <0.1 kU/L
Box Elder IgE: <0.1 kU/L
Candida Albicans: <0.1 kU/L
Curvularia lunata: <0.1 kU/L
D. farinae: <0.1 kU/L
Elm IgE: <0.1 kU/L
Fescue: <0.1 kU/L
G009 Red Top: <0.1 kU/L
Helminthosporium halodes: <0.1 kU/L
IgE (Immunoglobulin E), Serum: 30 kU/L (ref <115)
Lamb's Quarters: <0.1 kU/L
Oak: <0.1 kU/L
Plantain: <0.1 kU/L
Stemphylium Botryosum: <0.1 kU/L
Timothy Grass: <0.1 kU/L

## 2014-11-21 LAB — MOLD PROFILE
Aspergillus fumigatus, m3: <0.1 kU/L
Helminthosporium halodes: <0.1 kU/L
Penicillium Notatum: <0.1 kU/L

## 2014-11-23 NOTE — Assessment & Plan Note (Signed)
Or symptomatic this summer despite medication. Plan-educated elevation head of bed, basic reflux precautions. Educated potential connection between reflux and lung disease including asthma

## 2014-11-23 NOTE — Assessment & Plan Note (Signed)
Antihistamines were needed

## 2014-11-23 NOTE — Assessment & Plan Note (Signed)
More exacerbation over the summer. Has now started nebulized steroids. Plan-educated on potential role of reflux. Refill albuterol nebulizer solution. Increase strength of Advair to 250. Chest x-ray, allergy profiles-environmental and mold

## 2014-11-26 ENCOUNTER — Other Ambulatory Visit: Payer: Self-pay

## 2014-11-26 DIAGNOSIS — H401331 Pigmentary glaucoma, bilateral, mild stage: Secondary | ICD-10-CM | POA: Diagnosis not present

## 2014-11-26 MED ORDER — ALBUTEROL SULFATE (2.5 MG/3ML) 0.083% IN NEBU: 2.5000 mg | INHALATION_SOLUTION | Freq: Four times a day (QID) | RESPIRATORY_TRACT | Status: AC | PRN

## 2014-11-26 MED ORDER — BUDESONIDE 0.25 MG/2ML IN SUSP: 0.2500 mg | Freq: Two times a day (BID) | RESPIRATORY_TRACT | Status: DC

## 2014-11-27 ENCOUNTER — Encounter: Payer: Self-pay | Admitting: *Deleted

## 2014-12-06 ENCOUNTER — Other Ambulatory Visit: Payer: Self-pay | Admitting: Internal Medicine

## 2014-12-08 DIAGNOSIS — R05 Cough: Secondary | ICD-10-CM | POA: Diagnosis not present

## 2014-12-09 ENCOUNTER — Ambulatory Visit: Payer: PRIVATE HEALTH INSURANCE | Admitting: Podiatry

## 2014-12-15 DIAGNOSIS — M2012 Hallux valgus (acquired), left foot: Secondary | ICD-10-CM | POA: Diagnosis not present

## 2014-12-15 DIAGNOSIS — Z01818 Encounter for other preprocedural examination: Secondary | ICD-10-CM | POA: Diagnosis not present

## 2014-12-16 ENCOUNTER — Ambulatory Visit (INDEPENDENT_AMBULATORY_CARE_PROVIDER_SITE_OTHER): Payer: Medicare Other | Admitting: Podiatry

## 2014-12-16 VITALS — BP 142/83 | HR 83 | Resp 16

## 2014-12-16 DIAGNOSIS — M722 Plantar fascial fibromatosis: Secondary | ICD-10-CM

## 2014-12-16 DIAGNOSIS — M2010 Hallux valgus (acquired), unspecified foot: Secondary | ICD-10-CM | POA: Diagnosis not present

## 2014-12-16 DIAGNOSIS — M2042 Other hammer toe(s) (acquired), left foot: Secondary | ICD-10-CM | POA: Diagnosis not present

## 2014-12-17 NOTE — Progress Notes (Signed)
This Whittier presents today to ask a few questions regarding her surgery next month. She is concerned about the plantar fibromas that are present to the plantar aspect of her left foot.  Objective: Vital signs are stable she is alert and oriented 3. She is scheduled to have a bunion procedure performed and hammertoe repairs. She has previously had an excision of plantar fibromas to the plantar aspect of her surgical foot. I explained to her that to remove any of the superficial dermatofibroma that are present on the plantar aspect of the foot would result in scarring that would be painful.  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  Plan: We'll follow-up with her regular schedule surgery.

## 2014-12-26 DIAGNOSIS — M25775 Osteophyte, left foot: Secondary | ICD-10-CM | POA: Diagnosis not present

## 2014-12-26 DIAGNOSIS — M25572 Pain in left ankle and joints of left foot: Secondary | ICD-10-CM | POA: Diagnosis not present

## 2014-12-26 DIAGNOSIS — M2012 Hallux valgus (acquired), left foot: Secondary | ICD-10-CM | POA: Diagnosis not present

## 2014-12-26 DIAGNOSIS — I10 Essential (primary) hypertension: Secondary | ICD-10-CM | POA: Diagnosis not present

## 2014-12-26 DIAGNOSIS — M898X7 Other specified disorders of bone, ankle and foot: Secondary | ICD-10-CM | POA: Diagnosis not present

## 2014-12-26 DIAGNOSIS — M2042 Other hammer toe(s) (acquired), left foot: Secondary | ICD-10-CM

## 2014-12-29 ENCOUNTER — Telehealth: Payer: Self-pay | Admitting: *Deleted

## 2014-12-29 NOTE — Telephone Encounter (Signed)
Pt states she had surgery on Friday.  I spoke with pt, she asked if she could be up on the foot 2 days after surgery.  I encouraged the pt to only be up on the foot 5 minutes/hour and only in the surgical boot.  Pt agreed.

## 2014-12-30 DIAGNOSIS — M25775 Osteophyte, left foot: Secondary | ICD-10-CM | POA: Diagnosis not present

## 2014-12-30 DIAGNOSIS — M2012 Hallux valgus (acquired), left foot: Secondary | ICD-10-CM | POA: Diagnosis not present

## 2014-12-30 DIAGNOSIS — M2042 Other hammer toe(s) (acquired), left foot: Secondary | ICD-10-CM | POA: Diagnosis not present

## 2014-12-31 ENCOUNTER — Encounter: Payer: Self-pay | Admitting: Podiatry

## 2014-12-31 NOTE — Progress Notes (Unsigned)
DOS 01/008/2016 left [place] bunionectomy, left dorsal tarsal exostectomy and left 2, 3, 4th hammer toe repair.

## 2015-01-01 ENCOUNTER — Ambulatory Visit (INDEPENDENT_AMBULATORY_CARE_PROVIDER_SITE_OTHER): Payer: Medicare Other

## 2015-01-01 ENCOUNTER — Ambulatory Visit (INDEPENDENT_AMBULATORY_CARE_PROVIDER_SITE_OTHER): Payer: Medicare Other | Admitting: Podiatry

## 2015-01-01 VITALS — BP 141/78 | HR 86 | Temp 98.6°F | Resp 16

## 2015-01-01 DIAGNOSIS — Z9889 Other specified postprocedural states: Secondary | ICD-10-CM

## 2015-01-01 DIAGNOSIS — M21612 Bunion of left foot: Secondary | ICD-10-CM

## 2015-01-01 DIAGNOSIS — M2012 Hallux valgus (acquired), left foot: Secondary | ICD-10-CM

## 2015-01-01 DIAGNOSIS — M2042 Other hammer toe(s) (acquired), left foot: Secondary | ICD-10-CM

## 2015-01-01 MED ORDER — MAGIC MOUTHWASH W/LIDOCAINE: 10.0000 mL | Freq: Three times a day (TID) | ORAL | Status: DC

## 2015-01-02 NOTE — Progress Notes (Signed)
She presents today 1 week status post [facility] bunion repair left, dorsal tarsal exostectomy left. And hammertoe with pins to his #2 #3 #4 left foot. She denies fever chills nausea vomiting muscle aches pains and states that the foot feels okay.  Objective: Vital signs are stable she's alert and oriented 3. Pulses are strongly palpable bilateral. Dry sterile dressing removed demonstrates moderate edema left lower extremity mild erythema does not appear to be cellulitic in nature. K wires are intact to toes #2 #3 and #4 no. Once noted. Good range of motion of the first metatarsophalangeal joint. Mild tenderness. Radiographic evaluation demonstrates [facility] bunion repair with pin fixation. Hammertoe repair with pins to toes #2 #3 and #4 in good position.  Assessment: Well-healing surgical foot left.  Plan: Redressed today with a dry sterile compressive dressing. And follow-up with her for suture removal in one week.

## 2015-01-05 ENCOUNTER — Ambulatory Visit (INDEPENDENT_AMBULATORY_CARE_PROVIDER_SITE_OTHER): Payer: Medicare Other | Admitting: Podiatry

## 2015-01-05 ENCOUNTER — Ambulatory Visit (INDEPENDENT_AMBULATORY_CARE_PROVIDER_SITE_OTHER): Payer: Medicare Other

## 2015-01-05 VITALS — BP 153/78 | HR 79 | Resp 16

## 2015-01-05 DIAGNOSIS — Z9889 Other specified postprocedural states: Secondary | ICD-10-CM

## 2015-01-05 DIAGNOSIS — M21612 Bunion of left foot: Secondary | ICD-10-CM

## 2015-01-05 DIAGNOSIS — M2012 Hallux valgus (acquired), left foot: Secondary | ICD-10-CM

## 2015-01-05 NOTE — Progress Notes (Signed)
She presents today for follow-up of her surgical foot left. She states this been hurting her since last night.  Objective: Vital signs are stable she is alert and oriented 3. Status post [facility] bunion repair with screws and pins to toes #2 #3 and number for the left foot. Mild edema and erythema about the forefoot left.  Assessment: Postop surgical pain left. 9, located.  Plan: Redressed the foot today dry sterile compressive dressing and placed her in a Darco shoe.

## 2015-01-08 ENCOUNTER — Encounter: Payer: Self-pay | Admitting: Podiatry

## 2015-01-08 ENCOUNTER — Ambulatory Visit (INDEPENDENT_AMBULATORY_CARE_PROVIDER_SITE_OTHER): Payer: Medicare Other | Admitting: Podiatry

## 2015-01-08 VITALS — BP 140/70 | HR 90 | Resp 12

## 2015-01-08 DIAGNOSIS — Z9889 Other specified postprocedural states: Secondary | ICD-10-CM

## 2015-01-08 NOTE — Progress Notes (Signed)
She presents today status post [place] bunion repair and hammertoe repair #2 #3 #4 with K wires left foot. I saw her in the [place] office just the other day with pain and redness in her forefoot. I placed her on an antibiotic and placed her in a Darco shoe. She states that she's doing much better and feels terrific.  Objective: Vital signs are stable she is alert and oriented 3. There is no erythema edema cellulitis drainage or odor K wires are intact and appear to be healing well.  Assessment: Well-healing surgical foot left.  Plan: Continue the use of the Darco shoe and I will follow-up with her in approximately 2 weeks. She does understand that it may be closer to 6 weeks before the pins are removed.

## 2015-01-21 ENCOUNTER — Encounter: Payer: Self-pay | Admitting: Internal Medicine

## 2015-01-21 ENCOUNTER — Ambulatory Visit (INDEPENDENT_AMBULATORY_CARE_PROVIDER_SITE_OTHER): Payer: Medicare Other | Admitting: Internal Medicine

## 2015-01-21 VITALS — BP 138/82 | HR 90 | Ht 62.0 in | Wt 179.0 lb

## 2015-01-21 DIAGNOSIS — K219 Gastro-esophageal reflux disease without esophagitis: Secondary | ICD-10-CM

## 2015-01-21 DIAGNOSIS — J45998 Other asthma: Secondary | ICD-10-CM

## 2015-01-21 DIAGNOSIS — J309 Allergic rhinitis, unspecified: Secondary | ICD-10-CM

## 2015-01-21 DIAGNOSIS — J302 Other seasonal allergic rhinitis: Secondary | ICD-10-CM

## 2015-01-21 DIAGNOSIS — J3089 Other allergic rhinitis: Secondary | ICD-10-CM

## 2015-01-21 NOTE — Progress Notes (Signed)
[birth year]11- 65 yoF never smoker followed for allergic rhinitis, asthma, complicated by GERD, HBP Last here-03/02/11   Since last here husband [name] "[name]" died. If she misses allergy shot by a few days she is convinced she feels it and wants to continue. All summer into fall has had head congestion, some drainage, fullness in throat. In recent days has yellow cough, pressure left ear.  Denies fever, green, sore throat, swollen glands. Says this interacts and aggravates her reflux.  Dr Maceo Pro gave Zpak 2 months ago- little help.   11/20/12- 66 yoF never smoker followed for allergic rhinitis, asthma, complicated by GERD, HBP FOLLOWS FOR: ran out of allergy vaccine with last shot last week-pt states she cant tell if vaccine is helping or not. More head congestion this spring. Dymista not better than Flonase. Has been doing a lot of yard work. She used up the last of her allergy vaccine. We had discussion of goals, risks, benefits and expectations, agreeing to stay off for observation now. Asthma control has been good, sensitive to strong odors.  11/20/13- 66 yoF never smoker followed for allergic rhinitis, asthma, complicated by GERD, HBP FOLLOWS FOR:has been off vaccine; recently had slight flares from oak trees in yard and brother with 2 cats (indoors) moved in. We had stopped allergy vaccine. Minor sneezing when outdoors. Brother and 2 cats just moved in. Environmental precautions discussed. Irritants/strong odors are also a trigger.  11/20/14-68 yoF never smoker followed for allergic rhinitis, asthma, complicated by GERD, HBP, glaucoma FOLLOWS FOR: Had a hard time through last winter and summer; has GERD and this isnt helping her asthma much at all. Trying to avoid steroids because of her glaucoma. Budesonide has been added to her nebulizer medicine Getting home mold assessment done  01/21/15- 68 yoF never smoker followed for allergic rhinitis, asthma, complicated by GERD, HBP, glaucoma FOLLOW FOR:   allergies doing fine.  discontinued allergy shots a year ago.  discuss asthma  House tested negative for molds. GERD now being managed with ranitidine and elevation of head of bed by her primary physician CXR 11/20/14-I reviewed image IMPRESSION: There is no active cardiopulmonary disease. Electronically Signed  By: David Martinique  On: 11/20/2014 10:27  ROS- see HPI Constitutional:   No-   weight loss, night sweats, fevers, chills, fatigue, lassitude. HEENT:   No-  headaches, difficulty swallowing, tooth/dental problems, sore throat,       No-  sneezing, itching, ear ache,    +nasal congestion, no-post nasal drip,  CV:  No-   chest pain, orthopnea, PND, swelling in lower extremities, anasarca, dizziness, palpitations Resp: No-   shortness of breath with exertion or at rest.              No-  productive cough,  + non-productive cough,  No- coughing up of blood.              No-   change in color of mucus.  + wheezing.   Skin: No-   rash or lesions. GI:  No-   +heartburn, indigestion, no-abdominal pain, nausea, vomiting, GU: MS: +  joint pain or swelling.   Neuro-     nothing unusual Psych:  No- change in mood or affect. No- depression or anxiety.  No memory loss.  OBJ General- Alert, Oriented, Affect-appropriate, Distress- none acute, overweight Skin- rash-none, lesions- none, excoriation- none Lymphadenopathy- none Head- atraumatic            Eyes- Gross vision intact, PERRLA, conjunctivae clear secretions  Ears- Hearing, canals-normal- minor wax.            Nose- +turbinate edema, no-Septal dev, mucus, polyps, erosion, perforation             Throat- Mallampati II , mucosa clear , drainage- none, tonsils- atrophic    Neck- flexible , trachea midline, no stridor , thyroid nl, carotid no bruit Chest - symmetrical excursion , unlabored           Heart/CV- RRR , no murmur , no gallop  , no rub, nl s1 s2                           - JVD- none , edema- none, stasis changes-  none, varices- none           Lung- clear to P&A,  Cough- none, dullness-none, rub- none           Chest wall-  Abd-  Br/ Gen/ Rectal- Not done, not indicated Extrem- cyanosis- none, clubbing, none, atrophy- none, strength- nl. + Osteoarthritic deformity of fingers. +left foot in boot after podiatric surgery Neuro- grossly intact to observation

## 2015-01-21 NOTE — Patient Instructions (Signed)
We can continue current treatment   Please call as needed 

## 2015-01-22 ENCOUNTER — Ambulatory Visit (INDEPENDENT_AMBULATORY_CARE_PROVIDER_SITE_OTHER): Payer: Medicare Other | Admitting: Podiatry

## 2015-01-22 ENCOUNTER — Ambulatory Visit (INDEPENDENT_AMBULATORY_CARE_PROVIDER_SITE_OTHER): Payer: Medicare Other

## 2015-01-22 DIAGNOSIS — M21611 Bunion of right foot: Secondary | ICD-10-CM

## 2015-01-22 DIAGNOSIS — Z9889 Other specified postprocedural states: Secondary | ICD-10-CM

## 2015-01-22 DIAGNOSIS — M2011 Hallux valgus (acquired), right foot: Secondary | ICD-10-CM | POA: Diagnosis not present

## 2015-01-22 NOTE — Progress Notes (Signed)
She presents today for a postop visit date of surgery was 12/26/2014 status post [facility] bunion repair and hammertoe repairs #2 #3 and #4 of the left foot. She denies fever chills nausea vomiting muscle aches and pains.  Objective: Vital signs are stable she is alert and oriented 3. Pulses are palpable left foot. K wires are intact to toes #2 #3 and #4 and radiographic evaluation confirms this as well as a complete bunion repair.  Assessment: Well-healing surgical toes left.  Plan: Redressed today and will allow her to start soaking this and I will follow-up with her in 2 weeks at which time we hope to be able pull the K wires.

## 2015-01-27 ENCOUNTER — Ambulatory Visit (INDEPENDENT_AMBULATORY_CARE_PROVIDER_SITE_OTHER): Payer: Medicare Other | Admitting: Podiatry

## 2015-01-27 ENCOUNTER — Encounter: Payer: Self-pay | Admitting: Podiatry

## 2015-01-27 ENCOUNTER — Encounter: Payer: Medicare Other | Admitting: Podiatry

## 2015-01-27 VITALS — BP 141/77 | HR 87 | Resp 16

## 2015-01-27 DIAGNOSIS — M2042 Other hammer toe(s) (acquired), left foot: Secondary | ICD-10-CM

## 2015-01-27 MED ORDER — CEPHALEXIN 500 MG PO CAPS: 500.0000 mg | ORAL_CAPSULE | Freq: Three times a day (TID) | ORAL | Status: DC

## 2015-01-27 NOTE — Progress Notes (Signed)
Jouri presents today about 6 weeks status post [place] bunion repair and hammertoe repair #2 #3 and #4 with pins. She's concerned that she may have an infection.  Objective: Vital signs are stable she is alert and oriented 3. Surgical sites appear to be good but the pins appear to be backing out slightly. The third toe appears to be mildly more erythematous and edematous than the other toes.  Assessment: Mild cellulitis third digit left foot postoperative complication.  Plan: Removed all of her pins today and placed her on an antibiotic. She will start soaking in Epsom salts water tomorrow.

## 2015-01-29 ENCOUNTER — Ambulatory Visit: Payer: Medicare Other | Admitting: Podiatry

## 2015-02-03 NOTE — Assessment & Plan Note (Signed)
We continue to watch off allergy vaccine. Discussed antihistamines for the spring season

## 2015-02-03 NOTE — Assessment & Plan Note (Signed)
Better control on ranitidine and after elevation of the head of her bed as discussed

## 2015-02-03 NOTE — Assessment & Plan Note (Signed)
House tested negative for molds. Asthma control has been very good. Medications reviewed. GERD control is also important for this problem.

## 2015-02-05 ENCOUNTER — Ambulatory Visit (INDEPENDENT_AMBULATORY_CARE_PROVIDER_SITE_OTHER): Payer: Medicare Other

## 2015-02-05 ENCOUNTER — Ambulatory Visit (INDEPENDENT_AMBULATORY_CARE_PROVIDER_SITE_OTHER): Payer: Medicare Other | Admitting: Podiatry

## 2015-02-05 DIAGNOSIS — M21612 Bunion of left foot: Secondary | ICD-10-CM

## 2015-02-05 DIAGNOSIS — M2012 Hallux valgus (acquired), left foot: Secondary | ICD-10-CM | POA: Diagnosis not present

## 2015-02-05 DIAGNOSIS — M2042 Other hammer toe(s) (acquired), left foot: Secondary | ICD-10-CM

## 2015-02-05 NOTE — Progress Notes (Signed)
She presents today for follow-up of a forefoot reconstruction 12/26/2014. She states that it seems to be doing much better.  Objective: Vital signs are stable she is alert and oriented 3. Much decrease in edema and erythema to the forefoot left. Radiographs confirm well-healing surgical foot.  Assessment: Well-healed surgical foot.  Plan: I'm encouraging her to wear her Darco shoe for the next 2 weeks at which time I'll have to be able to get into a tennis shoe.

## 2015-02-10 ENCOUNTER — Ambulatory Visit: Payer: Medicare Other | Admitting: Podiatry

## 2015-02-16 DIAGNOSIS — J209 Acute bronchitis, unspecified: Secondary | ICD-10-CM | POA: Diagnosis not present

## 2015-02-19 ENCOUNTER — Ambulatory Visit (INDEPENDENT_AMBULATORY_CARE_PROVIDER_SITE_OTHER): Payer: Medicare Other

## 2015-02-19 ENCOUNTER — Ambulatory Visit (INDEPENDENT_AMBULATORY_CARE_PROVIDER_SITE_OTHER): Payer: Medicare Other | Admitting: Podiatry

## 2015-02-19 DIAGNOSIS — M2042 Other hammer toe(s) (acquired), left foot: Secondary | ICD-10-CM

## 2015-02-19 DIAGNOSIS — M2012 Hallux valgus (acquired), left foot: Secondary | ICD-10-CM

## 2015-02-19 NOTE — Progress Notes (Signed)
She presents today 6 weeks status post [place] bunion repair left screws hammertoe repair #2 #3 #4 arthrodesis. She denies fever chills nausea vomiting muscle aches and pains that she is progressing well and is happy with her decision as well as her results.  Objective: Vital signs are stable she is alert and oriented 3 pulses are palpable left foot. She has good full range of motion of the first metatarsophalangeal joint and the toe sat nice and rectus. Much decrease in edema from previous evaluation and much decrease in pain.  Assessment: Well-healing surgical toes left foot.  Plan: Follow up with me in 1 month and won't allow her to get back in her regular shoe gear.

## 2015-02-24 DIAGNOSIS — H8111 Benign paroxysmal vertigo, right ear: Secondary | ICD-10-CM | POA: Diagnosis not present

## 2015-02-24 DIAGNOSIS — R05 Cough: Secondary | ICD-10-CM | POA: Diagnosis not present

## 2015-03-05 DIAGNOSIS — H401331 Pigmentary glaucoma, bilateral, mild stage: Secondary | ICD-10-CM | POA: Diagnosis not present

## 2015-03-06 ENCOUNTER — Telehealth: Payer: Self-pay | Admitting: Internal Medicine

## 2015-03-06 ENCOUNTER — Ambulatory Visit (INDEPENDENT_AMBULATORY_CARE_PROVIDER_SITE_OTHER): Payer: Medicare Other | Admitting: Internal Medicine

## 2015-03-06 ENCOUNTER — Encounter: Payer: Self-pay | Admitting: Internal Medicine

## 2015-03-06 VITALS — BP 144/70 | HR 84 | Ht 62.0 in | Wt 177.8 lb

## 2015-03-06 DIAGNOSIS — J45998 Other asthma: Secondary | ICD-10-CM

## 2015-03-06 DIAGNOSIS — J0100 Acute maxillary sinusitis, unspecified: Secondary | ICD-10-CM | POA: Diagnosis not present

## 2015-03-06 MED ORDER — CODEINE POLST-CHLORPHEN POLST 14.7-2.8 MG/5ML PO LQCR: 5.0000 mL | Freq: Two times a day (BID) | ORAL | Status: DC | PRN

## 2015-03-06 MED ORDER — AMOXICILLIN-POT CLAVULANATE 875-125 MG PO TABS: 1.0000 | ORAL_TABLET | Freq: Two times a day (BID) | ORAL | Status: DC

## 2015-03-06 MED ORDER — PREDNISONE 10 MG PO TABS: ORAL_TABLET | ORAL | Status: DC

## 2015-03-06 NOTE — Progress Notes (Signed)
[birth year]11- 65 yoF never smoker followed for allergic rhinitis, asthma, complicated by GERD, HBP Last here-03/02/11   Since last here husband [name] "[name]" died. If she misses allergy shot by a few days she is convinced she feels it and wants to continue. All summer into fall has had head congestion, some drainage, fullness in throat. In recent days has yellow cough, pressure left ear.  Denies fever, green, sore throat, swollen glands. Says this interacts and aggravates her reflux.  Dr Maceo Pro gave Zpak 2 months ago- little help.   11/20/12- 66 yoF never smoker followed for allergic rhinitis, asthma, complicated by GERD, HBP FOLLOWS FOR: ran out of allergy vaccine with last shot last week-pt states she cant tell if vaccine is helping or not. More head congestion this spring. Dymista not better than Flonase. Has been doing a lot of yard work. She used up the last of her allergy vaccine. We had discussion of goals, risks, benefits and expectations, agreeing to stay off for observation now. Asthma control has been good, sensitive to strong odors.  11/20/13- 66 yoF never smoker followed for allergic rhinitis, asthma, complicated by GERD, HBP FOLLOWS FOR:has been off vaccine; recently had slight flares from oak trees in yard and brother with 2 cats (indoors) moved in. We had stopped allergy vaccine. Minor sneezing when outdoors. Brother and 2 cats just moved in. Environmental precautions discussed. Irritants/strong odors are also a trigger.  11/20/14-68 yoF never smoker followed for allergic rhinitis, asthma, complicated by GERD, HBP, glaucoma FOLLOWS FOR: Had a hard time through last winter and summer; has GERD and this isnt helping her asthma much at all. Trying to avoid steroids because of her glaucoma. Budesonide has been added to her nebulizer medicine Getting home mold assessment done  01/21/15- 68 yoF never smoker followed for allergic rhinitis, asthma, complicated by GERD, HBP, glaucoma FOLLOW FOR:   allergies doing fine.  discontinued allergy shots a year ago.  discuss asthma  House tested negative for molds. GERD now being managed with ranitidine and elevation of head of bed by her primary physician CXR 11/20/14-I reviewed image IMPRESSION: There is no active cardiopulmonary disease. Electronically Signed  By: [name]  On: 11/20/2014 10:27  03/06/15- 68 yoF never smoker followed for allergic rhinitis, asthma, complicated by GERD, HBP, glaucoma ACUTE VISIT: for 4 wks now and visit to Dr Maceo Pro x 2-increased cough, stopped up ears(pressure), head congestion as well. Denies any fever. Raspy voice-slight drainage for 1 week now. Comes and goes. No fever or chills but now has laryngitis. Some frontal headache. History of cough equivalent asthma. She is not short of breath. Her primary physician had given Z-Pak and Tussionex when conservative management didn't help. She is now using Delsym since running out of Tussionex. Increased use of rescue inhaler.  ROS- see HPI Constitutional:   No-   weight loss, night sweats, fevers, chills, fatigue, lassitude. HEENT:   No-  headaches, difficulty swallowing, tooth/dental problems, sore throat,       No-  sneezing, itching, ear ache,    +nasal congestion, no-post nasal drip,  CV:  No-   chest pain, orthopnea, PND, swelling in lower extremities, anasarca, dizziness, palpitations Resp: No-   shortness of breath with exertion or at rest.              No-  productive cough,  + non-productive cough,  No- coughing up of blood.              No-   change  in color of mucus.  + wheezing.   Skin: No-   rash or lesions. GI:  No-   +heartburn, indigestion, no-abdominal pain, nausea, vomiting, GU: MS: +  joint pain or swelling.   Neuro-     nothing unusual Psych:  No- change in mood or affect. No- depression or anxiety.  No memory loss.  OBJ General- Alert, Oriented, Affect-appropriate, Distress- none acute, overweight Skin- rash-none, lesions- none,  excoriation- none Lymphadenopathy- none Head- atraumatic            Eyes- Gross vision intact, PERRLA, conjunctivae clear secretions            Ears- Hearing, canals-normal- minor wax.            Nose- +turbinate edema, no-Septal dev, mucus, polyps, erosion, perforation             Throat- Mallampati II , mucosa clear , drainage- none, tonsils- atrophic , + hoarse   Neck- flexible , trachea midline, no stridor , thyroid nl, carotid no bruit Chest - symmetrical excursion , unlabored           Heart/CV- RRR , no murmur , no gallop  , no rub, nl s1 s2                           - JVD- none , edema- none, stasis changes- none, varices- none           Lung- clear to P&A,  Cough-+ dry/hard, dullness-none, rub- none, wheeze-none           Chest wall-  Abd-  Br/ Gen/ Rectal- Not done, not indicated Extrem- cyanosis- none, clubbing, none, atrophy- none, strength- nl. + Osteoarthritic deformity of fingers.  Neuro- grossly intact to observation

## 2015-03-06 NOTE — Patient Instructions (Signed)
Scripts sent for augmentin and prednisone  Script printed with discount card for Tuzistra XR cough syrup. If it is too expensive, then let us know.

## 2015-03-06 NOTE — Telephone Encounter (Signed)
Left message to call back  

## 2015-03-07 ENCOUNTER — Encounter: Payer: Self-pay | Admitting: Internal Medicine

## 2015-03-07 ENCOUNTER — Encounter: Payer: Self-pay | Admitting: Podiatry

## 2015-03-08 DIAGNOSIS — J0100 Acute maxillary sinusitis, unspecified: Secondary | ICD-10-CM | POA: Insufficient documentation

## 2015-03-08 NOTE — Assessment & Plan Note (Signed)
Mostly cough without active wheeze but this is consistent with an exacerbation of asthmatic bronchitis. Plan-prednisone taper with discussion, Augmentin, Tuzistra XR cough syrup discount card

## 2015-03-08 NOTE — Assessment & Plan Note (Signed)
Headache and persistent cough, associated earlier with earache, suggest she may have a sinus infection. Plan-prednisone 8 day taper, Augmentin

## 2015-03-09 MED ORDER — PROMETHAZINE-CODEINE 6.25-10 MG/5ML PO SYRP: 5.0000 mL | ORAL_SOLUTION | Freq: Four times a day (QID) | ORAL | Status: DC | PRN

## 2015-03-09 NOTE — Telephone Encounter (Signed)
[phone] pt calling back

## 2015-03-09 NOTE — Telephone Encounter (Signed)
See telephone note 03/06/15 Nothing further needed.

## 2015-03-09 NOTE — Telephone Encounter (Signed)
Triage to send prometh codeine cs instead

## 2015-03-09 NOTE — Telephone Encounter (Signed)
Spoke with pt - states that her medication for cough (Tuzistra XR) is not available at any pharmacy. Pt states that she checked with multiple pharmacies and no one carries it.  Please advise Dr Annamaria Boots what alternative can be called in. Thanks.  Allergies  Allergen Reactions  . Naproxen      Medication List       This list is accurate as of: 03/06/15 11:59 PM.  Always use your most recent med list.               albuterol 108 (90 BASE) MCG/ACT inhaler  Commonly known as:  PROAIR HFA  Inhale 2 puffs into the lungs every 4 (four) hours as needed for wheezing or shortness of breath.     albuterol (2.5 MG/3ML) 0.083% nebulizer solution  Commonly known as:  PROVENTIL  Take 3 mLs (2.5 mg total) by nebulization every 6 (six) hours as needed for wheezing or shortness of breath.     ALPRAZolam 0.25 MG tablet  Commonly known as:  XANAX  Take 1 tablet by mouth At bedtime.     amoxicillin-clavulanate 875-125 MG per tablet  Commonly known as:  AUGMENTIN  Take 1 tablet by mouth 2 (two) times daily.     budesonide 0.25 MG/2ML nebulizer solution  Commonly known as:  PULMICORT  Take 2 mLs (0.25 mg total) by nebulization 2 (two) times daily.     celecoxib 200 MG capsule  Commonly known as:  CELEBREX  Take 200 mg by mouth daily.     Codeine Polst-Chlorphen Polst 14.7-2.8 MG/5ML Lqcr  Commonly known as:  TUZISTRA XR  Take 5 mLs by mouth every 12 (twelve) hours as needed.     CoQ10 100 MG Caps  Take by mouth.     fluticasone 50 MCG/ACT nasal spray  Commonly known as:  FLONASE  INSTILL 1-2 SPRAYS IN EACH NOSTRIL EVERY DAY     Fluticasone-Salmeterol 250-50 MCG/DOSE Aepb  Commonly known as:  ADVAIR DISKUS  1 puff and rinse well, twice daily     KRILL OIL PO  Take by mouth.     levothyroxine 50 MCG tablet  Commonly known as:  SYNTHROID, LEVOTHROID  Take 1 tablet by mouth daily.     losartan-hydrochlorothiazide 100-12.5 MG per tablet  Commonly known as:  HYZAAR  Take 1 tablet by  mouth daily.     MUCINEX DM PO  Take by mouth.     multivitamin tablet  Take 1 tablet by mouth daily.     predniSONE 10 MG tablet  Commonly known as:  DELTASONE  4 X 2 DAYS, 3 X 2 DAYS, 2 X 2 DAYS, 1 X 2 DAYS     ranitidine 300 MG tablet  Commonly known as:  ZANTAC     rOPINIRole 2 MG tablet  Commonly known as:  REQUIP     venlafaxine XR 75 MG 24 hr capsule  Commonly known as:  EFFEXOR-XR     Vitamin D (Ergocalciferol) 50000 UNITS Caps capsule  Commonly known as:  DRISDOL

## 2015-03-09 NOTE — Telephone Encounter (Signed)
rx printed and left at front desk for patient to pick up.  Patient notified. Nothing further needed.

## 2015-03-09 NOTE — Telephone Encounter (Signed)
Per CY- Offer Promethazine with Codeine, 228ml. Take 20ml q6h prn for cough.   lmtcb for pt.

## 2015-03-12 ENCOUNTER — Other Ambulatory Visit: Payer: Self-pay

## 2015-03-12 ENCOUNTER — Other Ambulatory Visit: Payer: Self-pay | Admitting: Internal Medicine

## 2015-03-12 MED ORDER — FLUTICASONE PROPIONATE 50 MCG/ACT NA SUSP: 2.0000 | Freq: Every day | NASAL | Status: DC

## 2015-03-12 MED ORDER — FLUTICASONE-SALMETEROL 250-50 MCG/DOSE IN AEPB: INHALATION_SPRAY | RESPIRATORY_TRACT | Status: DC

## 2015-03-24 ENCOUNTER — Ambulatory Visit (INDEPENDENT_AMBULATORY_CARE_PROVIDER_SITE_OTHER): Payer: Medicare Other

## 2015-03-24 ENCOUNTER — Encounter: Payer: Self-pay | Admitting: Podiatry

## 2015-03-24 ENCOUNTER — Ambulatory Visit (INDEPENDENT_AMBULATORY_CARE_PROVIDER_SITE_OTHER): Payer: Medicare Other | Admitting: Podiatry

## 2015-03-24 VITALS — BP 137/73 | HR 89 | Resp 12

## 2015-03-24 DIAGNOSIS — Z9889 Other specified postprocedural states: Secondary | ICD-10-CM

## 2015-03-24 DIAGNOSIS — M2012 Hallux valgus (acquired), left foot: Secondary | ICD-10-CM

## 2015-03-24 NOTE — Progress Notes (Signed)
She presents today for a follow-up of her surgical foot left date of surgery 12/26/2014 she states this seems to be doing wonderfully.  Objective: Vital signs are stable she is alert and oriented 3. Left foot demonstrates minimal edema and no erythema saline as drainage or odor. Great range of motion of the first metatarsophalangeal joint foot and hammertoe repairs same-day knee doing very well toes #2 and #3 of the left foot. Radiograph confirms well-healing surgical foot.  Assessment: Well-healed surgical foot left.  Plan: We will release her today. Should she have questions or concerns she will notify us immediately. She would like to consider surgery on the right foot which has previously had a bunion repair with a [place]. She'll consider this in the fall.

## 2015-03-25 ENCOUNTER — Telehealth: Payer: Self-pay | Admitting: Internal Medicine

## 2015-03-25 MED ORDER — AMOXICILLIN-POT CLAVULANATE 875-125 MG PO TABS: 1.0000 | ORAL_TABLET | Freq: Two times a day (BID) | ORAL | Status: DC

## 2015-03-25 MED ORDER — PREDNISONE 10 MG PO TABS: ORAL_TABLET | ORAL | Status: DC

## 2015-03-25 NOTE — Telephone Encounter (Signed)
Offer augmentin 875 # 14, 1 twice daily  Offer prednisone 10 mg, # 20, 4 X 2 DAYS, 3 X 2 DAYS, 2 X 2 DAYS, 1 X 2 DAYS  Unless she has other ideas about what would help

## 2015-03-25 NOTE — Telephone Encounter (Signed)
Spoke with pt, aware of recs.  Sent meds to pharmacy.  Nothing further needed.

## 2015-03-25 NOTE — Telephone Encounter (Signed)
Spoke with pt. States that her symptoms from her last OV have come back. Reports hoarseness, dry cough and chest congestion. Would like something sent in if possible.  Allergies  Allergen Reactions  . Naproxen     CY - please advise. Thanks.

## 2015-03-30 DIAGNOSIS — E559 Vitamin D deficiency, unspecified: Secondary | ICD-10-CM | POA: Diagnosis not present

## 2015-03-30 DIAGNOSIS — I10 Essential (primary) hypertension: Secondary | ICD-10-CM | POA: Diagnosis not present

## 2015-03-30 DIAGNOSIS — E780 Pure hypercholesterolemia: Secondary | ICD-10-CM | POA: Diagnosis not present

## 2015-03-30 DIAGNOSIS — E039 Hypothyroidism, unspecified: Secondary | ICD-10-CM | POA: Diagnosis not present

## 2015-04-02 ENCOUNTER — Other Ambulatory Visit: Payer: Self-pay

## 2015-04-02 DIAGNOSIS — Z1231 Encounter for screening mammogram for malignant neoplasm of breast: Secondary | ICD-10-CM

## 2015-04-10 ENCOUNTER — Ambulatory Visit
Admission: RE | Admit: 2015-04-10 | Discharge: 2015-04-10 | Disposition: A | Discharge status: Home / Self Care | Payer: Medicare Other | Source: Ambulatory Visit

## 2015-04-10 DIAGNOSIS — Z1231 Encounter for screening mammogram for malignant neoplasm of breast: Secondary | ICD-10-CM

## 2015-05-25 DIAGNOSIS — Z0000 Encounter for general adult medical examination without abnormal findings: Secondary | ICD-10-CM | POA: Diagnosis not present

## 2015-06-09 DIAGNOSIS — H401331 Pigmentary glaucoma, bilateral, mild stage: Secondary | ICD-10-CM | POA: Diagnosis not present

## 2015-06-15 ENCOUNTER — Other Ambulatory Visit: Payer: Self-pay

## 2015-06-24 DIAGNOSIS — K219 Gastro-esophageal reflux disease without esophagitis: Secondary | ICD-10-CM | POA: Diagnosis not present

## 2015-06-24 DIAGNOSIS — R413 Other amnesia: Secondary | ICD-10-CM | POA: Diagnosis not present

## 2015-08-28 DIAGNOSIS — M720 Palmar fascial fibromatosis [Dupuytren]: Secondary | ICD-10-CM | POA: Diagnosis not present

## 2015-09-03 ENCOUNTER — Ambulatory Visit: Payer: Medicare Other | Admitting: Podiatry

## 2015-09-08 ENCOUNTER — Ambulatory Visit: Payer: Medicare Other | Admitting: Podiatry

## 2015-09-15 DIAGNOSIS — H401331 Pigmentary glaucoma, bilateral, mild stage: Secondary | ICD-10-CM | POA: Diagnosis not present

## 2015-10-15 DIAGNOSIS — Z23 Encounter for immunization: Secondary | ICD-10-CM | POA: Diagnosis not present

## 2015-10-15 DIAGNOSIS — R51 Headache: Secondary | ICD-10-CM | POA: Diagnosis not present

## 2015-10-22 ENCOUNTER — Telehealth: Payer: Self-pay | Admitting: *Deleted

## 2015-10-22 ENCOUNTER — Ambulatory Visit: Payer: PRIVATE HEALTH INSURANCE | Admitting: Neurology

## 2015-10-22 NOTE — Telephone Encounter (Signed)
Called to cancel less than 24 hours prior to appt time - schedule conflict.

## 2015-11-03 ENCOUNTER — Encounter: Payer: Self-pay | Admitting: Neurology

## 2015-11-03 ENCOUNTER — Ambulatory Visit (INDEPENDENT_AMBULATORY_CARE_PROVIDER_SITE_OTHER): Payer: Medicare Other | Admitting: Neurology

## 2015-11-03 VITALS — BP 135/78 | HR 79 | Ht 62.0 in | Wt 167.0 lb

## 2015-11-03 DIAGNOSIS — R51 Headache: Secondary | ICD-10-CM | POA: Diagnosis not present

## 2015-11-03 DIAGNOSIS — R519 Headache, unspecified: Secondary | ICD-10-CM

## 2015-11-03 MED ORDER — GABAPENTIN 300 MG PO CAPS: 300.0000 mg | ORAL_CAPSULE | Freq: Three times a day (TID) | ORAL | Status: DC

## 2015-11-03 NOTE — Progress Notes (Signed)
PATIENT: [name] DOB: 1946-08-14  Chief Complaint  Patient presents with  . Facial Pain    She is here with her brother, [name]. She has been having intermittent, left-sided facial pain for the last 5-6 months.  She has not has any testing for her symptoms or tried any medications.     HISTORICAL  [name] is a 69 years old right-handed female, seen in refer by her primary care physician Dr. Consuelo Pandy in November 03 2015 accompanied by her brother daughter, for evaluation of intermittent left facial pain  I reviewed and summarized her office note, she had a history of depression, hypothyroidism, on supplement, restless leg syndrome, hypertension, she also had a history of allergic to NSAIDs, body rash,  She developed left facial pain since June 2016, initially when she lie on her left side sleep, she felt somebody is pulling her hair, later symptoms has become more persistent, discomfort achy pain also spread to involving her left forehead, left cheek, left chin, left neck, she denies swallowing difficulty, no dysarthria, no visual loss, no upper or lower extremity weakness.  She complains of left-sided decreased hearing, pressure in left ear, this has been ongoing since 2013.  She denies rash broke out, already had shingle vaccination. Symptoms overall has progressed since its onset, from intermittent to become more persistent,  REVIEW OF SYSTEMS: Full 14 system review of systems performed and notable only for as above  ALLERGIES: Allergies  Allergen Reactions  . Naproxen     HOME MEDICATIONS: Current Outpatient Prescriptions  Medication Sig Dispense Refill  . albuterol (PROAIR HFA) 108 (90 BASE) MCG/ACT inhaler Inhale 2 puffs into the lungs every 4 (four) hours as needed for wheezing or shortness of breath. 2 Inhaler prn  . albuterol (PROVENTIL) (2.5 MG/3ML) 0.083% nebulizer solution Take 3 mLs (2.5 mg total) by nebulization every 6 (six) hours as needed  for wheezing or shortness of breath. 1080 mL 3  . ALPRAZolam (XANAX) 0.25 MG tablet Take 1 tablet by mouth At bedtime.    . budesonide (PULMICORT) 0.25 MG/2ML nebulizer solution Take 2 mLs (0.25 mg total) by nebulization 2 (two) times daily. 180 mL 3  . buPROPion (WELLBUTRIN XL) 300 MG 24 hr tablet TK 1 T PO QAM  0  . celecoxib (CELEBREX) 200 MG capsule Take 200 mg by mouth daily.      . Coenzyme Q10 (COQ10) 100 MG CAPS Take by mouth.    . fluticasone (FLONASE) 50 MCG/ACT nasal spray Place 2 sprays into both nostrils daily. 16 g 3  . Fluticasone-Salmeterol (ADVAIR DISKUS) 250-50 MCG/DOSE AEPB 1 puff and rinse well, twice daily 60 each 3  . latanoprost (XALATAN) 0.005 % ophthalmic solution   2  . levothyroxine (SYNTHROID, LEVOTHROID) 50 MCG tablet Take 1 tablet by mouth daily.    Marland Kitchen losartan-hydrochlorothiazide (HYZAAR) 100-12.5 MG per tablet Take 1 tablet by mouth daily.      . Multiple Vitamin (MULTIVITAMIN) tablet Take 1 tablet by mouth daily.    . pantoprazole (PROTONIX) 40 MG tablet TK 1 T PO QD  0  . ranitidine (ZANTAC) 300 MG tablet     . rOPINIRole (REQUIP) 2 MG tablet   1  . venlafaxine XR (EFFEXOR-XR) 75 MG 24 hr capsule   0   No current facility-administered medications for this visit.    PAST MEDICAL HISTORY: Past Medical History  Diagnosis Date  . Allergic rhinitis   . Asthma   . GERD (gastroesophageal reflux  disease)   . Hypertension   . Hypothyroidism   . Arthritis     PAST SURGICAL HISTORY: Past Surgical History  Procedure Laterality Date  . Bunionectomy      RIGHT FOOT ARCH REPAIRED AS WELL  . Total abdominal hysterectomy    . Dilation and curettage of uterus      X 3  . Bladder tack      X 2  . Knee arthroscopy      RIGHT    FAMILY HISTORY: Family History  Problem Relation Age of Onset  . Pneumonia Father   . Arthritis Mother     SOCIAL HISTORY:  Social History   Social History  . Marital Status: Married    Spouse Name: N/A  . Number of  Children: 2  . Years of Education: 12   Occupational History  . Retired    Social History Main Topics  . Smoking status: Never Smoker   . Smokeless tobacco: Not on file  . Alcohol Use: 0.0 oz/week    0 Standard drinks or equivalent per week     Comment: 1-2 glasses of wine per week  . Drug Use: No  . Sexual Activity: Not on file   Other Topics Concern  . Not on file   Social History Narrative   Lives at home with her brother.   Right-handed.   3-4 cups coffee per day.     PHYSICAL EXAM   Filed Vitals:   11/03/15 1030  BP: 135/78  Pulse: 79  Height: 5\' 2"  (1.575 m)  Weight: 167 lb (75.751 kg)    Not recorded      Body mass index is 30.54 kg/(m^2).  PHYSICAL EXAMNIATION:  Gen: NAD, conversant, well nourised, obese, well groomed                     Cardiovascular: Regular rate rhythm, no peripheral edema, warm, nontender. Eyes: Conjunctivae clear without exudates or hemorrhage Neck: Supple, no carotid bruise. Pulmonary: Clear to auscultation bilaterally   NEUROLOGICAL EXAM:  MENTAL STATUS: Speech:    Speech is normal; fluent and spontaneous with normal comprehension.  Cognition:     Orientation to time, place and person     Normal recent and remote memory     Normal Attention span and concentration     Normal Language, naming, repeating,spontaneous speech     Fund of knowledge   CRANIAL NERVES: CN II: Visual fields are full to confrontation. Fundoscopic exam is normal with sharp discs and no vascular changes. Pupils are round equal and briskly reactive to light. CN III, IV, VI: extraocular movement are normal. No ptosis. CN V: Facial sensation is intact to pinprick in all 3 divisions bilaterally. Corneal responses are intact.  CN VII: Face is symmetric with normal eye closure and smile. CN VIII: Hearing is normal to rubbing fingers CN IX, X: Palate elevates symmetrically. Phonation is normal. CN XI: Head turning and shoulder shrug are intact CN XII:  Tongue is midline with normal movements and no atrophy.  MOTOR: There is no pronator drift of out-stretched arms. Muscle bulk and tone are normal. Muscle strength is normal.  REFLEXES: Reflexes are 2+ and symmetric at the biceps, triceps, knees, and ankles. Plantar responses are flexor.  SENSORY: Intact to light touch, pinprick, position sense, and vibration sense are intact in fingers and toes.  COORDINATION: Rapid alternating movements and fine finger movements are intact. There is no dysmetria on finger-to-nose and heel-knee-shin.  GAIT/STANCE: Posture is normal. Gait is steady with normal steps, base, arm swing, and turning. Heel and toe walking are normal. Tandem gait is normal.  Romberg is absent.   DIAGNOSTIC DATA (LABS, IMAGING, TESTING) - I reviewed patient records, labs, notes, testing and imaging myself where available.   ASSESSMENT AND PLAN  YOSELI RUBALCAVA is a 69 y.o. female   Atypical left facial pain  MRI of the brain with and without contrast to rule out structural lesion  Gabapentin 300 mg 3 times a day  Laboratory evaluations    Marcial Pacas, M.D. Ph.D.  [facility] [address], [place], [place] [address] Ph: [phone] Fax: [phone]  CC: Briscoe Deutscher, MD

## 2015-11-04 ENCOUNTER — Telehealth: Payer: Self-pay | Admitting: Neurology

## 2015-11-04 LAB — COMPREHENSIVE METABOLIC PANEL
ALT: 21 IU/L (ref 0–32)
AST: 24 IU/L (ref 0–40)
Albumin/Globulin Ratio: 1.7 (ref 1.1–2.5)
Albumin: 4.1 g/dL (ref 3.6–4.8)
Alkaline Phosphatase: 79 IU/L (ref 39–117)
BUN/Creatinine Ratio: 22 (ref 11–26)
BUN: 22 mg/dL (ref 8–27)
Bilirubin Total: 0.3 mg/dL (ref 0.0–1.2)
CALCIUM: 9.6 mg/dL (ref 8.7–10.3)
CO2: 25 mmol/L (ref 18–29)
Chloride: 101 mmol/L (ref 97–106)
Creatinine, Ser: 1.02 mg/dL — ABNORMAL HIGH (ref 0.57–1.00)
GFR calc Af Amer: 66 mL/min/{1.73_m2} (ref >59)
GFR, EST NON AFRICAN AMERICAN: 57 mL/min/{1.73_m2} — AB (ref >59)
GLOBULIN, TOTAL: 2.4 g/dL (ref 1.5–4.5)
Glucose: 96 mg/dL (ref 65–99)
POTASSIUM: 4.5 mmol/L (ref 3.5–5.2)
SODIUM: 141 mmol/L (ref 136–144)
Total Protein: 6.5 g/dL (ref 6.0–8.5)

## 2015-11-04 NOTE — Telephone Encounter (Signed)
Left message to return call to the office.

## 2015-11-04 NOTE — Telephone Encounter (Signed)
Please call patient, laboratory showed mild elevated creatinine 1.02, please advise patient to drink plenty water before and after MRI contrast

## 2015-11-04 NOTE — Telephone Encounter (Signed)
Aware of lab results and verbalized understanding to drink plenty of water before and after MRI.

## 2015-11-13 DIAGNOSIS — Z23 Encounter for immunization: Secondary | ICD-10-CM | POA: Diagnosis not present

## 2015-11-13 DIAGNOSIS — S61209A Unspecified open wound of unspecified finger without damage to nail, initial encounter: Secondary | ICD-10-CM | POA: Diagnosis not present

## 2015-11-15 ENCOUNTER — Ambulatory Visit
Admission: RE | Admit: 2015-11-15 | Discharge: 2015-11-15 | Disposition: A | Discharge status: Home / Self Care | Payer: Medicare Other | Source: Ambulatory Visit | Attending: Neurology | Admitting: Neurology

## 2015-11-15 DIAGNOSIS — R51 Headache: Secondary | ICD-10-CM | POA: Diagnosis not present

## 2015-11-15 DIAGNOSIS — R519 Headache, unspecified: Secondary | ICD-10-CM

## 2015-11-15 MED ORDER — GADOBENATE DIMEGLUMINE 529 MG/ML IV SOLN
15.0000 mL | Freq: Once | INTRAVENOUS | Status: AC | PRN
  Administered 2015-11-15: 15 mL via INTRAVENOUS

## 2015-11-17 ENCOUNTER — Telehealth: Payer: Self-pay | Admitting: Neurology

## 2015-11-17 NOTE — Telephone Encounter (Signed)
Please call patient, MRI of the brain showed no significant abnormality, there was a incidental finding on the right eye socket, there was a mass at the right medial orbital apex, this could represent a cyst, inflammatory tissues, or a tumor, I will refer her to ophthalmologist,  If she wants, may move up her appointment to review the film   IMPRESSION: This MRI of the brain with and without contrast shows the following: 1. 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 There is homogenous enhancement. This finding could represent a cyst, neoplasm or focus of inflammatory tissue (orbital pseudotumor). Further clinical and ophthalmologic correlation is suggested. 2. The trigeminal nerves appear normal. 3. The brain is normal for age.

## 2015-11-17 NOTE — Telephone Encounter (Signed)
Spoke to patient - she is aware of results.  She is already an established patient at [facility] (ph [phone], fax [phone]).  I have faxed over the patient's MRI results for Dr. Delman Cheadle to review (spoke to his nurse, Maudie Mercury) and they will get the patient scheduled for an appointment.

## 2015-11-18 DIAGNOSIS — H401331 Pigmentary glaucoma, bilateral, mild stage: Secondary | ICD-10-CM | POA: Diagnosis not present

## 2015-11-18 NOTE — Telephone Encounter (Signed)
Dr. Delman Cheadle would like to speak to Dr. Felecia Shelling about MRI that he read on patient. W) [phone] or C) [phone], will probably refer patient to orbital eye surgeon for 2nd opinion, would like to know if MRI is available digitally if he needs to obtain this for visit to orbital eye surgeon. May call back today or tomorrow.

## 2015-11-18 NOTE — Telephone Encounter (Signed)
I spoke to Dr. Delman Cheadle.  Her ophtho exam is normal today.   He is going to check an orbital U/S and refer her to an orbital specialist.    Depending on results may need to f/u with another MRI to assess stability in 3 months or so

## 2015-11-20 ENCOUNTER — Other Ambulatory Visit: Payer: Self-pay | Admitting: Ophthalmology

## 2015-11-20 NOTE — Telephone Encounter (Signed)
I have spoken with Lynn--I have never had to order an orbital u/s, so I am not sure where to send this pt.  My best quess would be [facility].  She verbalized understanding of same/fim

## 2015-11-20 NOTE — Telephone Encounter (Signed)
[name] with Dr Maurie Boettcher office [phone] called inquiring where pt could be sent for ultraound of orbits. [name] does not and [facility].

## 2015-11-24 ENCOUNTER — Other Ambulatory Visit: Payer: Self-pay | Admitting: Internal Medicine

## 2015-11-27 DIAGNOSIS — S8255XA Nondisplaced fracture of medial malleolus of left tibia, initial encounter for closed fracture: Secondary | ICD-10-CM | POA: Diagnosis not present

## 2015-11-27 DIAGNOSIS — S82302A Unspecified fracture of lower end of left tibia, initial encounter for closed fracture: Secondary | ICD-10-CM | POA: Diagnosis not present

## 2015-12-02 DIAGNOSIS — S8255XD Nondisplaced fracture of medial malleolus of left tibia, subsequent encounter for closed fracture with routine healing: Secondary | ICD-10-CM | POA: Diagnosis not present

## 2015-12-03 DIAGNOSIS — S8252XA Displaced fracture of medial malleolus of left tibia, initial encounter for closed fracture: Secondary | ICD-10-CM | POA: Diagnosis not present

## 2015-12-03 DIAGNOSIS — G8918 Other acute postprocedural pain: Secondary | ICD-10-CM | POA: Diagnosis not present

## 2015-12-03 DIAGNOSIS — W19XXXA Unspecified fall, initial encounter: Secondary | ICD-10-CM | POA: Diagnosis not present

## 2015-12-03 DIAGNOSIS — Y929 Unspecified place or not applicable: Secondary | ICD-10-CM | POA: Diagnosis not present

## 2015-12-03 DIAGNOSIS — S8262XA Displaced fracture of lateral malleolus of left fibula, initial encounter for closed fracture: Secondary | ICD-10-CM | POA: Diagnosis not present

## 2015-12-11 DIAGNOSIS — S8255XD Nondisplaced fracture of medial malleolus of left tibia, subsequent encounter for closed fracture with routine healing: Secondary | ICD-10-CM | POA: Diagnosis not present

## 2015-12-24 ENCOUNTER — Ambulatory Visit: Payer: Medicare Other | Admitting: Neurology

## 2015-12-28 ENCOUNTER — Encounter: Payer: Self-pay | Admitting: Neurology

## 2016-01-01 ENCOUNTER — Ambulatory Visit (INDEPENDENT_AMBULATORY_CARE_PROVIDER_SITE_OTHER): Payer: Medicare Other | Admitting: Neurology

## 2016-01-01 ENCOUNTER — Encounter: Payer: Self-pay | Admitting: Neurology

## 2016-01-01 VITALS — BP 142/72 | HR 88 | Resp 20

## 2016-01-01 DIAGNOSIS — H05811 Cyst of right orbit: Secondary | ICD-10-CM | POA: Insufficient documentation

## 2016-01-01 MED ORDER — GABAPENTIN 100 MG PO CAPS: ORAL_CAPSULE | ORAL | Status: DC

## 2016-01-01 NOTE — Progress Notes (Signed)
Chief Complaint  Patient presents with  . Follow-up    pt had a fall and fractured her ankle, had 3 pins placed, thinks it may be related to the gabapentin because it makes her dizzy, rm 5  . Facial Pain    gabapentin was really helping but she hasn't taken the gabapentin since her fall, the facial pain is coming back      PATIENT: [name] DOB: 08-22-1946  Chief Complaint  Patient presents with  . Follow-up    pt had a fall and fractured her ankle, had 3 pins placed, thinks it may be related to the gabapentin because it makes her dizzy, rm 5  . Facial Pain    gabapentin was really helping but she hasn't taken the gabapentin since her fall, the facial pain is coming back     HISTORICAL  INEISHA [name] is a 70 years old right-handed female, seen in refer by her primary care physician Dr. Consuelo Pandy in November 03 2015 accompanied by her brother and daughter, for evaluation of intermittent left facial pain  I reviewed and summarized her office note, she had a history of depression, hypothyroidism, on supplement, restless leg syndrome, hypertension, she also had a history of allergic to NSAIDs, body rash,  She developed left facial pain since June 2016, initially when she lie on her left side sculp, she felt somebody is pulling her hair, later symptoms has become more persistent, discomfort achy pain also spread to involving her left forehead, left cheek, left chin, left neck, she denies swallowing difficulty, no dysarthria, no visual loss, no upper or lower extremity weakness.  She complains of left-sided decreased hearing, pressure in left ear, this has been ongoing since 2013.  She denies rash broke out, already had shingle vaccination. Symptoms overall has progressed since its onset, from intermittent to become more persistent,  UPDATE Jan 01 2016: She fell in Dec 03 2015, broke her left ankle, require surgery, this happened after she took gabapentin 300mg , she had  dizziness. After she has stopped taking gabapentin, left facial pain has come back, in addition she also has left ankle surgical site neuropathic pain, failed to improve by changing her left ankle cast twice.  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 There is homogenous enhancement. This finding could represent a cyst, neoplasm or focus of inflammatory tissue (orbital pseudotumor). No intracranial lesions  REVIEW OF SYSTEMS: Full 14 system review of systems performed and notable only for activity change, excessive sweating, cough, restless leg, memory loss  ALLERGIES: Allergies  Allergen Reactions  . Naproxen     HOME MEDICATIONS: Current Outpatient Prescriptions  Medication Sig Dispense Refill  . ADVAIR DISKUS 250-50 MCG/DOSE AEPB INHALE 1 PUFF AND RINSE WELL TWICE DAILY 60 each 2  . albuterol (PROAIR HFA) 108 (90 BASE) MCG/ACT inhaler Inhale 2 puffs into the lungs every 4 (four) hours as needed for wheezing or shortness of breath. 2 Inhaler prn  . albuterol (PROVENTIL) (2.5 MG/3ML) 0.083% nebulizer solution Take 3 mLs (2.5 mg total) by nebulization every 6 (six) hours as needed for wheezing or shortness of breath. 1080 mL 3  . ALPRAZolam (XANAX) 0.25 MG tablet Take 1 tablet by mouth At bedtime.    . budesonide (PULMICORT) 0.25 MG/2ML nebulizer solution Take 2 mLs (0.25 mg total) by nebulization 2 (two) times daily. 180 mL 3  .  buPROPion (WELLBUTRIN XL) 300 MG 24 hr tablet TK 1 T PO QAM  0  . celecoxib (CELEBREX) 200 MG capsule Take 200 mg by mouth daily.      . fluticasone (FLONASE) 50 MCG/ACT nasal spray Place 2 sprays into both nostrils daily. 16 g 3  . Fluticasone-Salmeterol (ADVAIR DISKUS) 250-50 MCG/DOSE AEPB 1 puff and rinse well, twice daily 60 each 3  . latanoprost (XALATAN) 0.005 % ophthalmic solution   2  . levothyroxine (SYNTHROID, LEVOTHROID) 50 MCG  tablet Take 1 tablet by mouth daily.    Marland Kitchen losartan-hydrochlorothiazide (HYZAAR) 100-12.5 MG per tablet Take 1 tablet by mouth daily.      . Multiple Vitamin (MULTIVITAMIN) tablet Take 1 tablet by mouth daily.    Marland Kitchen oxyCODONE-acetaminophen (PERCOCET/ROXICET) 5-325 MG tablet TK 1-2 TS PO Q 4-6 H PRN P  0  . pantoprazole (PROTONIX) 40 MG tablet TK 1 T PO QD  0  . ranitidine (ZANTAC) 300 MG tablet     . rOPINIRole (REQUIP) 2 MG tablet   1  . venlafaxine XR (EFFEXOR-XR) 75 MG 24 hr capsule   0  . Coenzyme Q10 (COQ10) 100 MG CAPS Take by mouth. Reported on 01/01/2016    . gabapentin (NEURONTIN) 300 MG capsule Take 1 capsule (300 mg total) by mouth 3 (three) times daily. (Patient not taking: Reported on 01/01/2016) 90 capsule 6   No current facility-administered medications for this visit.    PAST MEDICAL HISTORY: Past Medical History  Diagnosis Date  . Allergic rhinitis   . Asthma   . GERD (gastroesophageal reflux disease)   . Hypertension   . Hypothyroidism   . Arthritis     PAST SURGICAL HISTORY: Past Surgical History  Procedure Laterality Date  . Bunionectomy      RIGHT FOOT ARCH REPAIRED AS WELL  . Total abdominal hysterectomy    . Dilation and curettage of uterus      X 3  . Bladder tack      X 2  . Knee arthroscopy      RIGHT    FAMILY HISTORY: Family History  Problem Relation Age of Onset  . Pneumonia Father   . Arthritis Mother     SOCIAL HISTORY:  Social History   Social History  . Marital Status: Married    Spouse Name: N/A  . Number of Children: 2  . Years of Education: 12   Occupational History  . Retired    Social History Main Topics  . Smoking status: Never Smoker   . Smokeless tobacco: Not on file  . Alcohol Use: 0.0 oz/week    0 Standard drinks or equivalent per week     Comment: 1-2 glasses of wine per week  . Drug Use: No  . Sexual Activity: Not on file   Other Topics Concern  . Not on file   Social History Narrative   Lives at home  with her brother.   Right-handed.   3-4 cups coffee per day.     PHYSICAL EXAM   Filed Vitals:   01/01/16 1104  BP: 142/72  Pulse: 88  Resp: 20    Not recorded      There is no weight on file to calculate BMI.  PHYSICAL EXAMNIATION:  Gen: NAD, conversant, well nourised, obese, well groomed                     Cardiovascular: Regular rate rhythm, no peripheral edema, warm, nontender. Eyes: Conjunctivae clear  without exudates or hemorrhage Neck: Supple, no carotid bruise. Pulmonary: Clear to auscultation bilaterally   NEUROLOGICAL EXAM:  MENTAL STATUS: Speech:    Speech is normal; fluent and spontaneous with normal comprehension.  Cognition:     Orientation to time, place and person     Normal recent and remote memory     Normal Attention span and concentration     Normal Language, naming, repeating,spontaneous speech     Fund of knowledge   CRANIAL NERVES: CN II: Visual fields are full to confrontation. Fundoscopic exam is normal with sharp discs and no vascular changes. Pupils are round equal and briskly reactive to light. CN III, IV, VI: extraocular movement are normal. No ptosis. CN V: Facial sensation is intact to pinprick in all 3 divisions bilaterally. Corneal responses are intact.  CN VII: Face is symmetric with normal eye closure and smile. CN VIII: Hearing is normal to rubbing fingers CN IX, X: Palate elevates symmetrically. Phonation is normal. CN XI: Head turning and shoulder shrug are intact CN XII: Tongue is midline with normal movements and no atrophy.  MOTOR: There is no pronator drift of out-stretched arms. Muscle bulk and tone are normal. Muscle strength is normal.  REFLEXES: Reflexes are 2+ and symmetric at the biceps, triceps, knees, and ankles. Plantar responses are flexor.  SENSORY: Intact to light touch, pinprick, position sense, and vibration sense are intact in fingers and toes.  COORDINATION: Rapid alternating movements and fine  finger movements are intact. There is no dysmetria on finger-to-nose and heel-knee-shin.    GAIT/STANCE: Deferred, left ankle in brace    DIAGNOSTIC DATA (LABS, IMAGING, TESTING) - I reviewed patient records, labs, notes, testing and imaging myself where available.   ASSESSMENT AND PLAN  NEOMI VIVEROS is a 70 y.o. female   Atypical left facial pain  has much improved with gabapentin, but higher dose caused dizziness, I gave her a new prescription prescription 100 mg, she can take up to 3 tablets 3 times a day   Right orbit mass   Will continue follow-up with her ophthalmologist Dr. Carolynn Sayers  Left ankle neuropathic pain  Will be helped by gabapentin as well    Marcial Pacas, M.D. Ph.D.  [facility] [address] [place], [place] [address] Ph: [phone] Fax: [phone]  CC: Briscoe Deutscher, MD

## 2016-01-08 DIAGNOSIS — S8255XD Nondisplaced fracture of medial malleolus of left tibia, subsequent encounter for closed fracture with routine healing: Secondary | ICD-10-CM | POA: Diagnosis not present

## 2016-01-26 ENCOUNTER — Ambulatory Visit: Payer: Medicare Other | Admitting: Internal Medicine

## 2016-01-27 ENCOUNTER — Ambulatory Visit (INDEPENDENT_AMBULATORY_CARE_PROVIDER_SITE_OTHER): Payer: Medicare Other | Admitting: Internal Medicine

## 2016-01-27 ENCOUNTER — Encounter: Payer: Self-pay | Admitting: Internal Medicine

## 2016-01-27 VITALS — BP 130/80 | HR 82 | Ht 62.0 in | Wt 169.5 lb

## 2016-01-27 DIAGNOSIS — K219 Gastro-esophageal reflux disease without esophagitis: Secondary | ICD-10-CM | POA: Diagnosis not present

## 2016-01-27 DIAGNOSIS — J45998 Other asthma: Secondary | ICD-10-CM | POA: Diagnosis not present

## 2016-01-27 NOTE — Progress Notes (Signed)
[birth year]/1811- 65 yoF never smoker followed for allergic rhinitis, asthma, complicated by GERD, HBP Last here-03/02/11   Since last here husband [name] "Gretta Cool" died. If she misses allergy shot by a few days she is convinced she feels it and wants to continue. All summer into fall has had head congestion, some drainage, fullness in throat. In recent days has yellow cough, pressure left ear.  Denies fever, green, sore throat, swollen glands. Says this interacts and aggravates her reflux.  Dr Maceo Pro gave Zpak 2 months ago- little help.   11/20/12- 66 yoF never smoker followed for allergic rhinitis, asthma, complicated by GERD, HBP FOLLOWS FOR: ran out of allergy vaccine with last shot last week-pt states she cant tell if vaccine is helping or not. More head congestion this spring. Dymista not better than Flonase. Has been doing a lot of yard work. She used up the last of her allergy vaccine. We had discussion of goals, risks, benefits and expectations, agreeing to stay off for observation now. Asthma control has been good, sensitive to strong odors.  11/20/13- 66 yoF never smoker followed for allergic rhinitis, asthma, complicated by GERD, HBP FOLLOWS FOR:has been off vaccine; recently had slight flares from oak trees in yard and brother with 2 cats (indoors) moved in. We had stopped allergy vaccine. Minor sneezing when outdoors. Brother and 2 cats just moved in. Environmental precautions discussed. Irritants/strong odors are also a trigger.  11/20/14-68 yoF never smoker followed for allergic rhinitis, asthma, complicated by GERD, HBP, glaucoma FOLLOWS FOR: Had a hard time through last winter and summer; has GERD and this isnt helping her asthma much at all. Trying to avoid steroids because of her glaucoma. Budesonide has been added to her nebulizer medicine Getting home mold assessment done  01/21/15- 68 yoF never smoker followed for allergic rhinitis, asthma, complicated by GERD, HBP, glaucoma FOLLOW FOR:   allergies doing fine.  discontinued allergy shots a year ago.  discuss asthma  House tested negative for molds. GERD now being managed with ranitidine and elevation of head of bed by her primary physician CXR 11/20/14-I reviewed image IMPRESSION: There is no active cardiopulmonary disease. Electronically Signed  By: David Martinique  On: 11/20/2014 10:27  03/06/15- 68 yoF never smoker followed for allergic rhinitis, asthma, complicated by GERD, HBP, glaucoma ACUTE VISIT: for 4 wks now and visit to Dr Maceo Pro x 2-increased cough, stopped up ears(pressure), head congestion as well. Denies any fever. Raspy voice-slight drainage for 1 week now. Comes and goes. No fever or chills but now has laryngitis. Some frontal headache. History of cough equivalent asthma. She is not short of breath. Her primary physician had given Z-Pak and Tussionex when conservative management didn't help. She is now using Delsym since running out of Tussionex. Increased use of rescue inhaler.   01/27/2016-70 year old female never smoker followed for allergic rhinitis, asthma, complicated by GERD, HBP, glaucoma FOLLOWS FOR: Pt states she has reflux at night with cough and keeps her head elevated; overall Asthma has been doing good. Minor nasal stuffiness that she does not feel needs to be treated. Chest feels clear, using rescue inhaler only occasionally. No recent acute infection.  ROS- see HPI Constitutional:   No-   weight loss, night sweats, fevers, chills, fatigue, lassitude. HEENT:   No-  headaches, difficulty swallowing, tooth/dental problems, sore throat,       No-  sneezing, itching, ear ache,    +nasal congestion, no-post nasal drip,  CV:  No-   chest pain, orthopnea, PND, swelling  in lower extremities, anasarca, dizziness, palpitations Resp: No-   shortness of breath with exertion or at rest.              No-  productive cough,  + non-productive cough,  No- coughing up of blood.              No-   change in color  of mucus.  + wheezing.   Skin: No-   rash or lesions. GI:  No-   +heartburn, indigestion, no-abdominal pain, nausea, vomiting, GU: MS: +  joint pain or swelling.   Neuro-     nothing unusual Psych:  No- change in mood or affect. No- depression or anxiety.  No memory loss.  OBJ General- Alert, Oriented, Affect-appropriate, Distress- none acute, + overweight Skin- rash-none, lesions- none, excoriation- none Lymphadenopathy- none Head- atraumatic            Eyes- Gross vision intact, PERRLA, conjunctivae clear secretions            Ears- Hearing, canals-normal- minor wax.            Nose- +turbinate edema, no-Septal dev, mucus, polyps, erosion, perforation             Throat- Mallampati II , mucosa clear , drainage- none, tonsils- atrophic , + hoarse   Neck- flexible , trachea midline, no stridor , thyroid nl, carotid no bruit Chest - symmetrical excursion , unlabored           Heart/CV- RRR , no murmur , no gallop  , no rub, nl s1 s2                           - JVD- none , edema- none, stasis changes- none, varices- none           Lung- clear to P&A,  Cough-none, dullness-none, rub- none, wheeze-none           Chest wall-  Abd-  Br/ Gen/ Rectal- Not done, not indicated Extrem- cyanosis- none, clubbing, none, atrophy- none, strength- nl. + Osteoarthritic deformity of fingers.              + Left foot in boot after fracture Neuro- grossly intact to observation

## 2016-01-27 NOTE — Patient Instructions (Signed)
We can continue present treatment. Call for refills when needed.  I hope everything else goes well.

## 2016-01-30 NOTE — Assessment & Plan Note (Signed)
Emphasized importance of better control. Discussed elevation of head of bed using bricks.

## 2016-01-30 NOTE — Assessment & Plan Note (Signed)
Satisfactory control with only occasional use of a rescue inhaler

## 2016-02-01 ENCOUNTER — Ambulatory Visit: Payer: Medicare Other | Admitting: Neurology

## 2016-02-05 DIAGNOSIS — S8255XD Nondisplaced fracture of medial malleolus of left tibia, subsequent encounter for closed fracture with routine healing: Secondary | ICD-10-CM | POA: Diagnosis not present

## 2016-02-08 DIAGNOSIS — Z961 Presence of intraocular lens: Secondary | ICD-10-CM | POA: Diagnosis not present

## 2016-02-08 DIAGNOSIS — Z886 Allergy status to analgesic agent status: Secondary | ICD-10-CM | POA: Diagnosis not present

## 2016-02-08 DIAGNOSIS — I10 Essential (primary) hypertension: Secondary | ICD-10-CM | POA: Diagnosis not present

## 2016-02-08 DIAGNOSIS — H18053 Posterior corneal pigmentations, bilateral: Secondary | ICD-10-CM | POA: Diagnosis not present

## 2016-02-08 DIAGNOSIS — H0589 Other disorders of orbit: Secondary | ICD-10-CM | POA: Diagnosis not present

## 2016-02-08 DIAGNOSIS — J45909 Unspecified asthma, uncomplicated: Secondary | ICD-10-CM | POA: Diagnosis not present

## 2016-02-08 DIAGNOSIS — H21233 Degeneration of iris (pigmentary), bilateral: Secondary | ICD-10-CM | POA: Diagnosis not present

## 2016-02-08 DIAGNOSIS — R51 Headache: Secondary | ICD-10-CM | POA: Diagnosis not present

## 2016-02-08 DIAGNOSIS — Z7951 Long term (current) use of inhaled steroids: Secondary | ICD-10-CM | POA: Diagnosis not present

## 2016-02-08 DIAGNOSIS — Z9849 Cataract extraction status, unspecified eye: Secondary | ICD-10-CM | POA: Diagnosis not present

## 2016-02-08 DIAGNOSIS — Z79899 Other long term (current) drug therapy: Secondary | ICD-10-CM | POA: Diagnosis not present

## 2016-02-15 DIAGNOSIS — R269 Unspecified abnormalities of gait and mobility: Secondary | ICD-10-CM | POA: Diagnosis not present

## 2016-02-15 DIAGNOSIS — H401331 Pigmentary glaucoma, bilateral, mild stage: Secondary | ICD-10-CM | POA: Diagnosis not present

## 2016-02-15 DIAGNOSIS — M25572 Pain in left ankle and joints of left foot: Secondary | ICD-10-CM | POA: Diagnosis not present

## 2016-02-16 DIAGNOSIS — H0589 Other disorders of orbit: Secondary | ICD-10-CM | POA: Diagnosis not present

## 2016-02-16 DIAGNOSIS — H21233 Degeneration of iris (pigmentary), bilateral: Secondary | ICD-10-CM | POA: Diagnosis not present

## 2016-02-17 DIAGNOSIS — R269 Unspecified abnormalities of gait and mobility: Secondary | ICD-10-CM | POA: Diagnosis not present

## 2016-02-17 DIAGNOSIS — M25572 Pain in left ankle and joints of left foot: Secondary | ICD-10-CM | POA: Diagnosis not present

## 2016-02-19 DIAGNOSIS — M25572 Pain in left ankle and joints of left foot: Secondary | ICD-10-CM | POA: Diagnosis not present

## 2016-02-19 DIAGNOSIS — R269 Unspecified abnormalities of gait and mobility: Secondary | ICD-10-CM | POA: Diagnosis not present

## 2016-02-22 DIAGNOSIS — R269 Unspecified abnormalities of gait and mobility: Secondary | ICD-10-CM | POA: Diagnosis not present

## 2016-02-22 DIAGNOSIS — M25572 Pain in left ankle and joints of left foot: Secondary | ICD-10-CM | POA: Diagnosis not present

## 2016-02-23 DIAGNOSIS — Z886 Allergy status to analgesic agent status: Secondary | ICD-10-CM | POA: Diagnosis not present

## 2016-02-23 DIAGNOSIS — Z7951 Long term (current) use of inhaled steroids: Secondary | ICD-10-CM | POA: Diagnosis not present

## 2016-02-23 DIAGNOSIS — J45909 Unspecified asthma, uncomplicated: Secondary | ICD-10-CM | POA: Diagnosis not present

## 2016-02-23 DIAGNOSIS — Z961 Presence of intraocular lens: Secondary | ICD-10-CM | POA: Diagnosis not present

## 2016-02-23 DIAGNOSIS — I10 Essential (primary) hypertension: Secondary | ICD-10-CM | POA: Diagnosis not present

## 2016-02-23 DIAGNOSIS — H18053 Posterior corneal pigmentations, bilateral: Secondary | ICD-10-CM | POA: Diagnosis not present

## 2016-02-23 DIAGNOSIS — Z9849 Cataract extraction status, unspecified eye: Secondary | ICD-10-CM | POA: Diagnosis not present

## 2016-02-23 DIAGNOSIS — Z79899 Other long term (current) drug therapy: Secondary | ICD-10-CM | POA: Diagnosis not present

## 2016-02-23 DIAGNOSIS — H0589 Other disorders of orbit: Secondary | ICD-10-CM | POA: Diagnosis not present

## 2016-02-25 DIAGNOSIS — R269 Unspecified abnormalities of gait and mobility: Secondary | ICD-10-CM | POA: Diagnosis not present

## 2016-02-25 DIAGNOSIS — M25572 Pain in left ankle and joints of left foot: Secondary | ICD-10-CM | POA: Diagnosis not present

## 2016-03-09 DIAGNOSIS — M25572 Pain in left ankle and joints of left foot: Secondary | ICD-10-CM | POA: Diagnosis not present

## 2016-03-09 DIAGNOSIS — R269 Unspecified abnormalities of gait and mobility: Secondary | ICD-10-CM | POA: Diagnosis not present

## 2016-03-11 DIAGNOSIS — R269 Unspecified abnormalities of gait and mobility: Secondary | ICD-10-CM | POA: Diagnosis not present

## 2016-03-11 DIAGNOSIS — M25572 Pain in left ankle and joints of left foot: Secondary | ICD-10-CM | POA: Diagnosis not present

## 2016-03-12 ENCOUNTER — Other Ambulatory Visit: Payer: Self-pay | Admitting: Internal Medicine

## 2016-03-14 DIAGNOSIS — R269 Unspecified abnormalities of gait and mobility: Secondary | ICD-10-CM | POA: Diagnosis not present

## 2016-03-14 DIAGNOSIS — M25572 Pain in left ankle and joints of left foot: Secondary | ICD-10-CM | POA: Diagnosis not present

## 2016-03-18 DIAGNOSIS — S8252XD Displaced fracture of medial malleolus of left tibia, subsequent encounter for closed fracture with routine healing: Secondary | ICD-10-CM | POA: Diagnosis not present

## 2016-03-21 DIAGNOSIS — F419 Anxiety disorder, unspecified: Secondary | ICD-10-CM | POA: Diagnosis not present

## 2016-03-21 DIAGNOSIS — E7800 Pure hypercholesterolemia, unspecified: Secondary | ICD-10-CM | POA: Diagnosis not present

## 2016-03-21 DIAGNOSIS — J309 Allergic rhinitis, unspecified: Secondary | ICD-10-CM | POA: Diagnosis not present

## 2016-03-21 DIAGNOSIS — F329 Major depressive disorder, single episode, unspecified: Secondary | ICD-10-CM | POA: Diagnosis not present

## 2016-03-21 DIAGNOSIS — E039 Hypothyroidism, unspecified: Secondary | ICD-10-CM | POA: Diagnosis not present

## 2016-03-21 DIAGNOSIS — I10 Essential (primary) hypertension: Secondary | ICD-10-CM | POA: Diagnosis not present

## 2016-03-21 DIAGNOSIS — K219 Gastro-esophageal reflux disease without esophagitis: Secondary | ICD-10-CM | POA: Diagnosis not present

## 2016-03-21 DIAGNOSIS — J45909 Unspecified asthma, uncomplicated: Secondary | ICD-10-CM | POA: Diagnosis not present

## 2016-03-31 DIAGNOSIS — Z820 Family history of epilepsy and other diseases of the nervous system: Secondary | ICD-10-CM | POA: Diagnosis not present

## 2016-03-31 DIAGNOSIS — R410 Disorientation, unspecified: Secondary | ICD-10-CM | POA: Diagnosis not present

## 2016-03-31 DIAGNOSIS — R413 Other amnesia: Secondary | ICD-10-CM | POA: Diagnosis not present

## 2016-03-31 DIAGNOSIS — G459 Transient cerebral ischemic attack, unspecified: Secondary | ICD-10-CM | POA: Diagnosis not present

## 2016-03-31 DIAGNOSIS — R4781 Slurred speech: Secondary | ICD-10-CM | POA: Diagnosis not present

## 2016-03-31 DIAGNOSIS — Z8673 Personal history of transient ischemic attack (TIA), and cerebral infarction without residual deficits: Secondary | ICD-10-CM | POA: Diagnosis not present

## 2016-04-08 DIAGNOSIS — R4789 Other speech disturbances: Secondary | ICD-10-CM | POA: Diagnosis not present

## 2016-04-08 DIAGNOSIS — R4189 Other symptoms and signs involving cognitive functions and awareness: Secondary | ICD-10-CM | POA: Diagnosis not present

## 2016-04-11 ENCOUNTER — Other Ambulatory Visit: Payer: Self-pay

## 2016-04-11 DIAGNOSIS — Z1231 Encounter for screening mammogram for malignant neoplasm of breast: Secondary | ICD-10-CM

## 2016-04-14 DIAGNOSIS — E042 Nontoxic multinodular goiter: Secondary | ICD-10-CM | POA: Diagnosis not present

## 2016-04-14 DIAGNOSIS — R4789 Other speech disturbances: Secondary | ICD-10-CM | POA: Diagnosis not present

## 2016-04-14 DIAGNOSIS — R479 Unspecified speech disturbances: Secondary | ICD-10-CM | POA: Diagnosis not present

## 2016-04-14 DIAGNOSIS — G3184 Mild cognitive impairment, so stated: Secondary | ICD-10-CM | POA: Diagnosis not present

## 2016-04-28 DIAGNOSIS — M2550 Pain in unspecified joint: Secondary | ICD-10-CM | POA: Diagnosis not present

## 2016-04-28 DIAGNOSIS — G629 Polyneuropathy, unspecified: Secondary | ICD-10-CM | POA: Diagnosis not present

## 2016-04-28 DIAGNOSIS — E041 Nontoxic single thyroid nodule: Secondary | ICD-10-CM | POA: Diagnosis not present

## 2016-04-28 DIAGNOSIS — M720 Palmar fascial fibromatosis [Dupuytren]: Secondary | ICD-10-CM | POA: Diagnosis not present

## 2016-04-28 DIAGNOSIS — Z1159 Encounter for screening for other viral diseases: Secondary | ICD-10-CM | POA: Diagnosis not present

## 2016-05-02 ENCOUNTER — Ambulatory Visit
Admission: RE | Admit: 2016-05-02 | Discharge: 2016-05-02 | Disposition: A | Discharge status: Home / Self Care | Payer: Medicare Other | Source: Ambulatory Visit

## 2016-05-02 DIAGNOSIS — Z1231 Encounter for screening mammogram for malignant neoplasm of breast: Secondary | ICD-10-CM

## 2016-05-02 DIAGNOSIS — G500 Trigeminal neuralgia: Secondary | ICD-10-CM | POA: Diagnosis not present

## 2016-05-06 DIAGNOSIS — E042 Nontoxic multinodular goiter: Secondary | ICD-10-CM | POA: Diagnosis not present

## 2016-05-17 DIAGNOSIS — M25512 Pain in left shoulder: Secondary | ICD-10-CM | POA: Diagnosis not present

## 2016-05-17 DIAGNOSIS — S4992XA Unspecified injury of left shoulder and upper arm, initial encounter: Secondary | ICD-10-CM | POA: Diagnosis not present

## 2016-05-23 DIAGNOSIS — H401331 Pigmentary glaucoma, bilateral, mild stage: Secondary | ICD-10-CM | POA: Diagnosis not present

## 2016-05-25 DIAGNOSIS — E041 Nontoxic single thyroid nodule: Secondary | ICD-10-CM | POA: Diagnosis not present

## 2016-05-26 DIAGNOSIS — M7542 Impingement syndrome of left shoulder: Secondary | ICD-10-CM | POA: Diagnosis not present

## 2016-05-30 DIAGNOSIS — Z0000 Encounter for general adult medical examination without abnormal findings: Secondary | ICD-10-CM | POA: Diagnosis not present

## 2016-05-30 DIAGNOSIS — E559 Vitamin D deficiency, unspecified: Secondary | ICD-10-CM | POA: Diagnosis not present

## 2016-05-30 DIAGNOSIS — J4530 Mild persistent asthma, uncomplicated: Secondary | ICD-10-CM | POA: Diagnosis not present

## 2016-05-30 DIAGNOSIS — G629 Polyneuropathy, unspecified: Secondary | ICD-10-CM | POA: Diagnosis not present

## 2016-05-30 DIAGNOSIS — E041 Nontoxic single thyroid nodule: Secondary | ICD-10-CM | POA: Diagnosis not present

## 2016-05-30 DIAGNOSIS — M8589 Other specified disorders of bone density and structure, multiple sites: Secondary | ICD-10-CM | POA: Diagnosis not present

## 2016-05-30 DIAGNOSIS — F411 Generalized anxiety disorder: Secondary | ICD-10-CM | POA: Diagnosis not present

## 2016-05-30 DIAGNOSIS — E039 Hypothyroidism, unspecified: Secondary | ICD-10-CM | POA: Diagnosis not present

## 2016-05-30 DIAGNOSIS — I10 Essential (primary) hypertension: Secondary | ICD-10-CM | POA: Diagnosis not present

## 2016-05-30 DIAGNOSIS — F330 Major depressive disorder, recurrent, mild: Secondary | ICD-10-CM | POA: Diagnosis not present

## 2016-05-30 DIAGNOSIS — K219 Gastro-esophageal reflux disease without esophagitis: Secondary | ICD-10-CM | POA: Diagnosis not present

## 2016-05-30 DIAGNOSIS — G2581 Restless legs syndrome: Secondary | ICD-10-CM | POA: Diagnosis not present

## 2016-06-01 DIAGNOSIS — E039 Hypothyroidism, unspecified: Secondary | ICD-10-CM | POA: Diagnosis not present

## 2016-06-01 DIAGNOSIS — E042 Nontoxic multinodular goiter: Secondary | ICD-10-CM | POA: Diagnosis not present

## 2016-06-08 DIAGNOSIS — M899 Disorder of bone, unspecified: Secondary | ICD-10-CM | POA: Diagnosis not present

## 2016-06-08 DIAGNOSIS — M8589 Other specified disorders of bone density and structure, multiple sites: Secondary | ICD-10-CM | POA: Diagnosis not present

## 2016-06-09 DIAGNOSIS — M19012 Primary osteoarthritis, left shoulder: Secondary | ICD-10-CM | POA: Diagnosis not present

## 2016-06-09 DIAGNOSIS — M1712 Unilateral primary osteoarthritis, left knee: Secondary | ICD-10-CM | POA: Diagnosis not present

## 2016-06-29 ENCOUNTER — Ambulatory Visit: Payer: Medicare Other | Admitting: Neurology

## 2016-07-13 DIAGNOSIS — E039 Hypothyroidism, unspecified: Secondary | ICD-10-CM | POA: Diagnosis not present

## 2016-07-20 ENCOUNTER — Ambulatory Visit (INDEPENDENT_AMBULATORY_CARE_PROVIDER_SITE_OTHER): Payer: Medicare Other | Admitting: Psychiatry

## 2016-07-20 DIAGNOSIS — F411 Generalized anxiety disorder: Secondary | ICD-10-CM

## 2016-07-20 DIAGNOSIS — M19012 Primary osteoarthritis, left shoulder: Secondary | ICD-10-CM | POA: Diagnosis not present

## 2016-07-25 DIAGNOSIS — M25512 Pain in left shoulder: Secondary | ICD-10-CM | POA: Diagnosis not present

## 2016-07-28 ENCOUNTER — Ambulatory Visit (INDEPENDENT_AMBULATORY_CARE_PROVIDER_SITE_OTHER): Payer: Medicare Other | Admitting: Psychiatry

## 2016-07-28 DIAGNOSIS — F411 Generalized anxiety disorder: Secondary | ICD-10-CM

## 2016-08-02 DIAGNOSIS — G500 Trigeminal neuralgia: Secondary | ICD-10-CM | POA: Diagnosis not present

## 2016-08-03 DIAGNOSIS — M25512 Pain in left shoulder: Secondary | ICD-10-CM | POA: Diagnosis not present

## 2016-08-04 ENCOUNTER — Ambulatory Visit (INDEPENDENT_AMBULATORY_CARE_PROVIDER_SITE_OTHER): Payer: Medicare Other | Admitting: Psychiatry

## 2016-08-04 DIAGNOSIS — F411 Generalized anxiety disorder: Secondary | ICD-10-CM | POA: Diagnosis not present

## 2016-08-10 DIAGNOSIS — M75102 Unspecified rotator cuff tear or rupture of left shoulder, not specified as traumatic: Secondary | ICD-10-CM | POA: Diagnosis not present

## 2016-08-10 DIAGNOSIS — M25512 Pain in left shoulder: Secondary | ICD-10-CM | POA: Diagnosis not present

## 2016-08-10 DIAGNOSIS — M19012 Primary osteoarthritis, left shoulder: Secondary | ICD-10-CM | POA: Diagnosis not present

## 2016-08-12 DIAGNOSIS — M25512 Pain in left shoulder: Secondary | ICD-10-CM | POA: Diagnosis not present

## 2016-08-12 DIAGNOSIS — M75102 Unspecified rotator cuff tear or rupture of left shoulder, not specified as traumatic: Secondary | ICD-10-CM | POA: Diagnosis not present

## 2016-08-12 DIAGNOSIS — M19012 Primary osteoarthritis, left shoulder: Secondary | ICD-10-CM | POA: Diagnosis not present

## 2016-08-15 DIAGNOSIS — M75102 Unspecified rotator cuff tear or rupture of left shoulder, not specified as traumatic: Secondary | ICD-10-CM | POA: Diagnosis not present

## 2016-08-15 DIAGNOSIS — M19012 Primary osteoarthritis, left shoulder: Secondary | ICD-10-CM | POA: Diagnosis not present

## 2016-08-15 DIAGNOSIS — M25512 Pain in left shoulder: Secondary | ICD-10-CM | POA: Diagnosis not present

## 2016-08-16 ENCOUNTER — Ambulatory Visit (INDEPENDENT_AMBULATORY_CARE_PROVIDER_SITE_OTHER): Payer: Medicare Other | Admitting: Psychiatry

## 2016-08-16 DIAGNOSIS — F411 Generalized anxiety disorder: Secondary | ICD-10-CM | POA: Diagnosis not present

## 2016-08-18 DIAGNOSIS — M19012 Primary osteoarthritis, left shoulder: Secondary | ICD-10-CM | POA: Diagnosis not present

## 2016-08-18 DIAGNOSIS — M75102 Unspecified rotator cuff tear or rupture of left shoulder, not specified as traumatic: Secondary | ICD-10-CM | POA: Diagnosis not present

## 2016-08-18 DIAGNOSIS — M25512 Pain in left shoulder: Secondary | ICD-10-CM | POA: Diagnosis not present

## 2016-08-23 DIAGNOSIS — M75102 Unspecified rotator cuff tear or rupture of left shoulder, not specified as traumatic: Secondary | ICD-10-CM | POA: Diagnosis not present

## 2016-08-23 DIAGNOSIS — M19012 Primary osteoarthritis, left shoulder: Secondary | ICD-10-CM | POA: Diagnosis not present

## 2016-08-23 DIAGNOSIS — H401331 Pigmentary glaucoma, bilateral, mild stage: Secondary | ICD-10-CM | POA: Diagnosis not present

## 2016-08-23 DIAGNOSIS — H43392 Other vitreous opacities, left eye: Secondary | ICD-10-CM | POA: Diagnosis not present

## 2016-08-23 DIAGNOSIS — M25512 Pain in left shoulder: Secondary | ICD-10-CM | POA: Diagnosis not present

## 2016-08-24 DIAGNOSIS — M25512 Pain in left shoulder: Secondary | ICD-10-CM | POA: Diagnosis not present

## 2016-08-24 DIAGNOSIS — M75102 Unspecified rotator cuff tear or rupture of left shoulder, not specified as traumatic: Secondary | ICD-10-CM | POA: Diagnosis not present

## 2016-08-24 DIAGNOSIS — M19012 Primary osteoarthritis, left shoulder: Secondary | ICD-10-CM | POA: Diagnosis not present

## 2016-08-25 DIAGNOSIS — J45909 Unspecified asthma, uncomplicated: Secondary | ICD-10-CM | POA: Diagnosis not present

## 2016-08-25 DIAGNOSIS — Z888 Allergy status to other drugs, medicaments and biological substances status: Secondary | ICD-10-CM | POA: Diagnosis not present

## 2016-08-25 DIAGNOSIS — Z79899 Other long term (current) drug therapy: Secondary | ICD-10-CM | POA: Diagnosis not present

## 2016-08-25 DIAGNOSIS — Z961 Presence of intraocular lens: Secondary | ICD-10-CM | POA: Diagnosis not present

## 2016-08-25 DIAGNOSIS — I10 Essential (primary) hypertension: Secondary | ICD-10-CM | POA: Diagnosis not present

## 2016-08-25 DIAGNOSIS — H0589 Other disorders of orbit: Secondary | ICD-10-CM | POA: Diagnosis not present

## 2016-08-25 DIAGNOSIS — Z7951 Long term (current) use of inhaled steroids: Secondary | ICD-10-CM | POA: Diagnosis not present

## 2016-08-25 DIAGNOSIS — H18053 Posterior corneal pigmentations, bilateral: Secondary | ICD-10-CM | POA: Diagnosis not present

## 2016-08-25 DIAGNOSIS — Z7982 Long term (current) use of aspirin: Secondary | ICD-10-CM | POA: Diagnosis not present

## 2016-08-30 DIAGNOSIS — M25512 Pain in left shoulder: Secondary | ICD-10-CM | POA: Diagnosis not present

## 2016-08-30 DIAGNOSIS — M75102 Unspecified rotator cuff tear or rupture of left shoulder, not specified as traumatic: Secondary | ICD-10-CM | POA: Diagnosis not present

## 2016-08-30 DIAGNOSIS — M19012 Primary osteoarthritis, left shoulder: Secondary | ICD-10-CM | POA: Diagnosis not present

## 2016-08-31 DIAGNOSIS — M25512 Pain in left shoulder: Secondary | ICD-10-CM | POA: Diagnosis not present

## 2016-09-05 DIAGNOSIS — M75102 Unspecified rotator cuff tear or rupture of left shoulder, not specified as traumatic: Secondary | ICD-10-CM | POA: Diagnosis not present

## 2016-09-05 DIAGNOSIS — M25512 Pain in left shoulder: Secondary | ICD-10-CM | POA: Diagnosis not present

## 2016-09-05 DIAGNOSIS — M19012 Primary osteoarthritis, left shoulder: Secondary | ICD-10-CM | POA: Diagnosis not present

## 2016-09-06 ENCOUNTER — Ambulatory Visit (INDEPENDENT_AMBULATORY_CARE_PROVIDER_SITE_OTHER): Payer: Medicare Other | Admitting: Psychiatry

## 2016-09-06 DIAGNOSIS — F411 Generalized anxiety disorder: Secondary | ICD-10-CM

## 2016-09-12 DIAGNOSIS — M75102 Unspecified rotator cuff tear or rupture of left shoulder, not specified as traumatic: Secondary | ICD-10-CM | POA: Diagnosis not present

## 2016-09-12 DIAGNOSIS — M25512 Pain in left shoulder: Secondary | ICD-10-CM | POA: Diagnosis not present

## 2016-09-12 DIAGNOSIS — M19012 Primary osteoarthritis, left shoulder: Secondary | ICD-10-CM | POA: Diagnosis not present

## 2016-09-13 ENCOUNTER — Ambulatory Visit (INDEPENDENT_AMBULATORY_CARE_PROVIDER_SITE_OTHER): Payer: Medicare Other | Admitting: Psychiatry

## 2016-09-13 DIAGNOSIS — F411 Generalized anxiety disorder: Secondary | ICD-10-CM

## 2016-09-14 DIAGNOSIS — M75102 Unspecified rotator cuff tear or rupture of left shoulder, not specified as traumatic: Secondary | ICD-10-CM | POA: Diagnosis not present

## 2016-09-14 DIAGNOSIS — M19012 Primary osteoarthritis, left shoulder: Secondary | ICD-10-CM | POA: Diagnosis not present

## 2016-09-14 DIAGNOSIS — M25512 Pain in left shoulder: Secondary | ICD-10-CM | POA: Diagnosis not present

## 2016-09-16 DIAGNOSIS — M25512 Pain in left shoulder: Secondary | ICD-10-CM | POA: Diagnosis not present

## 2016-09-16 DIAGNOSIS — M19012 Primary osteoarthritis, left shoulder: Secondary | ICD-10-CM | POA: Diagnosis not present

## 2016-09-16 DIAGNOSIS — M75102 Unspecified rotator cuff tear or rupture of left shoulder, not specified as traumatic: Secondary | ICD-10-CM | POA: Diagnosis not present

## 2016-09-20 ENCOUNTER — Ambulatory Visit (INDEPENDENT_AMBULATORY_CARE_PROVIDER_SITE_OTHER): Payer: Medicare Other | Admitting: Psychiatry

## 2016-09-20 DIAGNOSIS — F411 Generalized anxiety disorder: Secondary | ICD-10-CM

## 2016-09-21 DIAGNOSIS — Z23 Encounter for immunization: Secondary | ICD-10-CM | POA: Diagnosis not present

## 2016-09-22 DIAGNOSIS — M25512 Pain in left shoulder: Secondary | ICD-10-CM | POA: Diagnosis not present

## 2016-09-22 DIAGNOSIS — M19012 Primary osteoarthritis, left shoulder: Secondary | ICD-10-CM | POA: Diagnosis not present

## 2016-09-22 DIAGNOSIS — M75102 Unspecified rotator cuff tear or rupture of left shoulder, not specified as traumatic: Secondary | ICD-10-CM | POA: Diagnosis not present

## 2016-09-27 ENCOUNTER — Ambulatory Visit (INDEPENDENT_AMBULATORY_CARE_PROVIDER_SITE_OTHER): Payer: Medicare Other | Admitting: Psychiatry

## 2016-09-27 DIAGNOSIS — F411 Generalized anxiety disorder: Secondary | ICD-10-CM | POA: Diagnosis not present

## 2016-09-28 DIAGNOSIS — M25512 Pain in left shoulder: Secondary | ICD-10-CM | POA: Diagnosis not present

## 2016-10-04 ENCOUNTER — Ambulatory Visit (INDEPENDENT_AMBULATORY_CARE_PROVIDER_SITE_OTHER): Payer: Medicare Other | Admitting: Psychiatry

## 2016-10-04 DIAGNOSIS — F411 Generalized anxiety disorder: Secondary | ICD-10-CM

## 2016-10-12 DIAGNOSIS — M720 Palmar fascial fibromatosis [Dupuytren]: Secondary | ICD-10-CM | POA: Diagnosis not present

## 2016-10-13 DIAGNOSIS — Z79899 Other long term (current) drug therapy: Secondary | ICD-10-CM | POA: Diagnosis not present

## 2016-10-13 DIAGNOSIS — Z886 Allergy status to analgesic agent status: Secondary | ICD-10-CM | POA: Diagnosis not present

## 2016-10-13 DIAGNOSIS — Z7982 Long term (current) use of aspirin: Secondary | ICD-10-CM | POA: Diagnosis not present

## 2016-10-13 DIAGNOSIS — Z961 Presence of intraocular lens: Secondary | ICD-10-CM | POA: Diagnosis not present

## 2016-10-13 DIAGNOSIS — Z9842 Cataract extraction status, left eye: Secondary | ICD-10-CM | POA: Diagnosis not present

## 2016-10-13 DIAGNOSIS — H18053 Posterior corneal pigmentations, bilateral: Secondary | ICD-10-CM | POA: Diagnosis not present

## 2016-10-13 DIAGNOSIS — J45909 Unspecified asthma, uncomplicated: Secondary | ICD-10-CM | POA: Diagnosis not present

## 2016-10-13 DIAGNOSIS — I10 Essential (primary) hypertension: Secondary | ICD-10-CM | POA: Diagnosis not present

## 2016-10-13 DIAGNOSIS — Z9841 Cataract extraction status, right eye: Secondary | ICD-10-CM | POA: Diagnosis not present

## 2016-10-13 DIAGNOSIS — Z7951 Long term (current) use of inhaled steroids: Secondary | ICD-10-CM | POA: Diagnosis not present

## 2016-10-13 DIAGNOSIS — H0589 Other disorders of orbit: Secondary | ICD-10-CM | POA: Diagnosis not present

## 2016-10-17 DIAGNOSIS — H9191 Unspecified hearing loss, right ear: Secondary | ICD-10-CM | POA: Diagnosis not present

## 2016-10-17 DIAGNOSIS — H90A32 Mixed conductive and sensorineural hearing loss, unilateral, left ear with restricted hearing on the contralateral side: Secondary | ICD-10-CM | POA: Diagnosis not present

## 2016-10-17 DIAGNOSIS — H9192 Unspecified hearing loss, left ear: Secondary | ICD-10-CM | POA: Diagnosis not present

## 2016-10-19 ENCOUNTER — Ambulatory Visit (INDEPENDENT_AMBULATORY_CARE_PROVIDER_SITE_OTHER): Payer: Medicare Other | Admitting: Psychiatry

## 2016-10-19 DIAGNOSIS — F411 Generalized anxiety disorder: Secondary | ICD-10-CM | POA: Diagnosis not present

## 2016-10-24 DIAGNOSIS — R52 Pain, unspecified: Secondary | ICD-10-CM | POA: Diagnosis not present

## 2016-10-24 DIAGNOSIS — M545 Low back pain: Secondary | ICD-10-CM | POA: Diagnosis not present

## 2016-10-24 DIAGNOSIS — R072 Precordial pain: Secondary | ICD-10-CM | POA: Diagnosis not present

## 2016-10-24 DIAGNOSIS — F330 Major depressive disorder, recurrent, mild: Secondary | ICD-10-CM | POA: Diagnosis not present

## 2016-10-24 DIAGNOSIS — I10 Essential (primary) hypertension: Secondary | ICD-10-CM | POA: Diagnosis not present

## 2016-10-24 DIAGNOSIS — F411 Generalized anxiety disorder: Secondary | ICD-10-CM | POA: Diagnosis not present

## 2016-10-24 DIAGNOSIS — N62 Hypertrophy of breast: Secondary | ICD-10-CM | POA: Diagnosis not present

## 2016-10-24 DIAGNOSIS — G8929 Other chronic pain: Secondary | ICD-10-CM | POA: Diagnosis not present

## 2016-11-03 ENCOUNTER — Ambulatory Visit: Payer: Medicare Other | Admitting: Psychiatry

## 2016-11-04 DIAGNOSIS — J0140 Acute pansinusitis, unspecified: Secondary | ICD-10-CM | POA: Diagnosis not present

## 2016-11-04 DIAGNOSIS — I10 Essential (primary) hypertension: Secondary | ICD-10-CM | POA: Diagnosis not present

## 2016-11-08 ENCOUNTER — Ambulatory Visit (INDEPENDENT_AMBULATORY_CARE_PROVIDER_SITE_OTHER): Payer: Medicare Other | Admitting: Psychiatry

## 2016-11-08 DIAGNOSIS — F411 Generalized anxiety disorder: Secondary | ICD-10-CM

## 2016-11-17 ENCOUNTER — Ambulatory Visit: Payer: Medicare Other | Admitting: Psychiatry

## 2016-11-23 DIAGNOSIS — H6122 Impacted cerumen, left ear: Secondary | ICD-10-CM | POA: Diagnosis not present

## 2016-11-23 DIAGNOSIS — J0141 Acute recurrent pansinusitis: Secondary | ICD-10-CM | POA: Diagnosis not present

## 2016-11-23 DIAGNOSIS — M87022 Idiopathic aseptic necrosis of left humerus: Secondary | ICD-10-CM | POA: Diagnosis not present

## 2016-11-23 DIAGNOSIS — S4992XA Unspecified injury of left shoulder and upper arm, initial encounter: Secondary | ICD-10-CM | POA: Diagnosis not present

## 2016-11-23 DIAGNOSIS — I10 Essential (primary) hypertension: Secondary | ICD-10-CM | POA: Diagnosis not present

## 2016-11-24 ENCOUNTER — Ambulatory Visit: Payer: Medicare Other | Admitting: Psychiatry

## 2016-11-30 DIAGNOSIS — M19012 Primary osteoarthritis, left shoulder: Secondary | ICD-10-CM | POA: Diagnosis not present

## 2016-12-01 ENCOUNTER — Ambulatory Visit (INDEPENDENT_AMBULATORY_CARE_PROVIDER_SITE_OTHER): Payer: Medicare Other | Admitting: Psychiatry

## 2016-12-01 DIAGNOSIS — F411 Generalized anxiety disorder: Secondary | ICD-10-CM | POA: Diagnosis not present

## 2016-12-05 DIAGNOSIS — G2581 Restless legs syndrome: Secondary | ICD-10-CM | POA: Diagnosis not present

## 2016-12-05 DIAGNOSIS — F411 Generalized anxiety disorder: Secondary | ICD-10-CM | POA: Diagnosis not present

## 2016-12-05 DIAGNOSIS — E041 Nontoxic single thyroid nodule: Secondary | ICD-10-CM | POA: Diagnosis not present

## 2016-12-05 DIAGNOSIS — J302 Other seasonal allergic rhinitis: Secondary | ICD-10-CM | POA: Diagnosis not present

## 2016-12-05 DIAGNOSIS — I10 Essential (primary) hypertension: Secondary | ICD-10-CM | POA: Diagnosis not present

## 2016-12-05 DIAGNOSIS — F330 Major depressive disorder, recurrent, mild: Secondary | ICD-10-CM | POA: Diagnosis not present

## 2016-12-05 DIAGNOSIS — K219 Gastro-esophageal reflux disease without esophagitis: Secondary | ICD-10-CM | POA: Diagnosis not present

## 2016-12-05 DIAGNOSIS — E559 Vitamin D deficiency, unspecified: Secondary | ICD-10-CM | POA: Diagnosis not present

## 2016-12-05 DIAGNOSIS — E039 Hypothyroidism, unspecified: Secondary | ICD-10-CM | POA: Diagnosis not present

## 2016-12-05 DIAGNOSIS — J3089 Other allergic rhinitis: Secondary | ICD-10-CM | POA: Diagnosis not present

## 2016-12-05 DIAGNOSIS — G629 Polyneuropathy, unspecified: Secondary | ICD-10-CM | POA: Diagnosis not present

## 2016-12-05 DIAGNOSIS — J4530 Mild persistent asthma, uncomplicated: Secondary | ICD-10-CM | POA: Diagnosis not present

## 2016-12-19 HISTORY — PX: REDUCTION MAMMAPLASTY: SUR839

## 2016-12-20 DIAGNOSIS — H43392 Other vitreous opacities, left eye: Secondary | ICD-10-CM | POA: Diagnosis not present

## 2016-12-20 DIAGNOSIS — H401331 Pigmentary glaucoma, bilateral, mild stage: Secondary | ICD-10-CM | POA: Diagnosis not present

## 2016-12-21 DIAGNOSIS — M19012 Primary osteoarthritis, left shoulder: Secondary | ICD-10-CM | POA: Diagnosis not present

## 2016-12-22 ENCOUNTER — Ambulatory Visit (INDEPENDENT_AMBULATORY_CARE_PROVIDER_SITE_OTHER): Payer: Medicare Other | Admitting: Psychiatry

## 2016-12-22 DIAGNOSIS — F411 Generalized anxiety disorder: Secondary | ICD-10-CM | POA: Diagnosis not present

## 2016-12-29 DIAGNOSIS — J0100 Acute maxillary sinusitis, unspecified: Secondary | ICD-10-CM | POA: Diagnosis not present

## 2016-12-29 DIAGNOSIS — J3089 Other allergic rhinitis: Secondary | ICD-10-CM | POA: Diagnosis not present

## 2016-12-29 DIAGNOSIS — J302 Other seasonal allergic rhinitis: Secondary | ICD-10-CM | POA: Diagnosis not present

## 2016-12-29 DIAGNOSIS — I10 Essential (primary) hypertension: Secondary | ICD-10-CM | POA: Diagnosis not present

## 2017-01-10 ENCOUNTER — Ambulatory Visit: Payer: Medicare Other | Admitting: Psychiatry

## 2017-01-16 DIAGNOSIS — I10 Essential (primary) hypertension: Secondary | ICD-10-CM | POA: Diagnosis not present

## 2017-01-16 DIAGNOSIS — J0141 Acute recurrent pansinusitis: Secondary | ICD-10-CM | POA: Diagnosis not present

## 2017-01-16 DIAGNOSIS — J208 Acute bronchitis due to other specified organisms: Secondary | ICD-10-CM | POA: Diagnosis not present

## 2017-01-23 DIAGNOSIS — N62 Hypertrophy of breast: Secondary | ICD-10-CM | POA: Diagnosis not present

## 2017-01-26 ENCOUNTER — Encounter: Payer: Self-pay | Admitting: Internal Medicine

## 2017-01-26 ENCOUNTER — Ambulatory Visit (INDEPENDENT_AMBULATORY_CARE_PROVIDER_SITE_OTHER): Payer: Medicare Other | Admitting: Internal Medicine

## 2017-01-26 DIAGNOSIS — J4520 Mild intermittent asthma, uncomplicated: Secondary | ICD-10-CM

## 2017-01-26 DIAGNOSIS — J0100 Acute maxillary sinusitis, unspecified: Secondary | ICD-10-CM | POA: Diagnosis not present

## 2017-01-26 NOTE — Progress Notes (Signed)
HPI F never smoker followed for allergic rhinitis, asthma, complicated by GERD, HBP, glaucoma  --------------------------------------------------------------------------------------  01/27/2016-71 year old female never smoker followed for allergic rhinitis, asthma, complicated by GERD, HBP, glaucoma FOLLOWS FOR: Pt states she has reflux at night with cough and keeps her head elevated; overall Asthma has been doing good. Minor nasal stuffiness that she does not feel needs to be treated. Chest feels clear, using rescue inhaler only occasionally. No recent acute infection.  01/26/2017-71 year old female never smoker followed for allergic rhinitis, asthma, complicated by GERD, HBP, glaucoma FOLLOWS FOR:  Pt denies any recent flares. Pt states that normally she has issues with weather change. Episode of bronchitis before Christmas. No wheezes only if has a significant viral illness. Uses her nebulizer machine about twice a week. Currently on antibiotic for sinusitis. Because of GERD she sleeps propped up and continues proton pump inhibitor   ROS- see HPI Constitutional:   No-   weight loss, night sweats, fevers, chills, fatigue, lassitude. HEENT:   No-  headaches, difficulty swallowing, tooth/dental problems, sore throat,       No-  sneezing, itching, ear ache,    +nasal congestion, no-post nasal drip,  CV:  No-   chest pain, orthopnea, PND, swelling in lower extremities, anasarca, dizziness, palpitations Resp: No-   shortness of breath with exertion or at rest.              No-  productive cough,  + non-productive cough,  No- coughing up of blood.              No-   change in color of mucus.  + wheezing.   Skin: No-   rash or lesions. GI:  No-   +heartburn, indigestion, no-abdominal pain, nausea, vomiting, GU: MS: +  joint pain or swelling.   Neuro-     nothing unusual Psych:  No- change in mood or affect. No- depression or anxiety.  No memory loss.  OBJ General- Alert, Oriented,  Affect-appropriate, Distress- none acute, + overweight Skin- rash-none, lesions- none, excoriation- none Lymphadenopathy- none Head- atraumatic            Eyes- Gross vision intact, PERRLA, conjunctivae clear secretions            Ears- Hearing, canals-normal- minor wax.            Nose- +turbinate edema, no-Septal dev, mucus, polyps, erosion, perforation             Throat- Mallampati II , mucosa clear , drainage- none seen, tonsils- atrophic ,   Neck- flexible , trachea midline, no stridor , thyroid nl, carotid no bruit Chest - symmetrical excursion , unlabored           Heart/CV- RRR , no murmur , no gallop  , no rub, nl s1 s2                           - JVD- none , edema- none, stasis changes- none, varices- none           Lung- clear to P&A,  Cough-none, dullness-none, rub- none, wheeze-none           Chest wall-  Abd-  Br/ Gen/ Rectal- Not done, not indicated Extrem- cyanosis- none, clubbing, none, atrophy- none, strength- nl. + Osteoarthritic deformity of fingers.              Neuro- grossly intact to observation

## 2017-01-26 NOTE — Patient Instructions (Signed)
We can continue current meds- please call if we can help

## 2017-01-31 NOTE — Assessment & Plan Note (Signed)
Continues effort to control for dust mites and environmental allergens in her home but looking forward to getting outside again as spring weather comes. She normally expects to wheeze only with significant viral pattern bronchitis/chest infections now. We reviewed medications to keep available.

## 2017-01-31 NOTE — Assessment & Plan Note (Signed)
Currently finishing treatment for acute maxillary sinusitis. We discussed symptom comparison sinusitis versus allergic rhinitis. Spring pollen season is approaching. We reviewed use of saline rinse, steroid nasal sprays, OTC antihistamines.

## 2017-02-02 DIAGNOSIS — R413 Other amnesia: Secondary | ICD-10-CM | POA: Diagnosis not present

## 2017-02-02 DIAGNOSIS — I10 Essential (primary) hypertension: Secondary | ICD-10-CM | POA: Diagnosis not present

## 2017-02-16 DIAGNOSIS — F411 Generalized anxiety disorder: Secondary | ICD-10-CM | POA: Diagnosis not present

## 2017-02-16 DIAGNOSIS — J4531 Mild persistent asthma with (acute) exacerbation: Secondary | ICD-10-CM | POA: Diagnosis not present

## 2017-02-16 DIAGNOSIS — F330 Major depressive disorder, recurrent, mild: Secondary | ICD-10-CM | POA: Diagnosis not present

## 2017-02-16 DIAGNOSIS — I10 Essential (primary) hypertension: Secondary | ICD-10-CM | POA: Diagnosis not present

## 2017-02-16 DIAGNOSIS — J0141 Acute recurrent pansinusitis: Secondary | ICD-10-CM | POA: Diagnosis not present

## 2017-03-06 DIAGNOSIS — I10 Essential (primary) hypertension: Secondary | ICD-10-CM | POA: Diagnosis not present

## 2017-03-06 DIAGNOSIS — F411 Generalized anxiety disorder: Secondary | ICD-10-CM | POA: Diagnosis not present

## 2017-03-06 DIAGNOSIS — F330 Major depressive disorder, recurrent, mild: Secondary | ICD-10-CM | POA: Diagnosis not present

## 2017-03-14 DIAGNOSIS — H401331 Pigmentary glaucoma, bilateral, mild stage: Secondary | ICD-10-CM | POA: Diagnosis not present

## 2017-04-19 DIAGNOSIS — R413 Other amnesia: Secondary | ICD-10-CM | POA: Diagnosis not present

## 2017-05-04 DIAGNOSIS — M170 Bilateral primary osteoarthritis of knee: Secondary | ICD-10-CM | POA: Diagnosis not present

## 2017-05-18 DIAGNOSIS — M170 Bilateral primary osteoarthritis of knee: Secondary | ICD-10-CM | POA: Diagnosis not present

## 2017-05-24 DIAGNOSIS — M170 Bilateral primary osteoarthritis of knee: Secondary | ICD-10-CM | POA: Diagnosis not present

## 2017-05-31 DIAGNOSIS — M170 Bilateral primary osteoarthritis of knee: Secondary | ICD-10-CM | POA: Diagnosis not present

## 2017-06-01 DIAGNOSIS — E039 Hypothyroidism, unspecified: Secondary | ICD-10-CM | POA: Diagnosis not present

## 2017-06-01 DIAGNOSIS — E042 Nontoxic multinodular goiter: Secondary | ICD-10-CM | POA: Diagnosis not present

## 2017-06-01 DIAGNOSIS — I10 Essential (primary) hypertension: Secondary | ICD-10-CM | POA: Diagnosis not present

## 2017-06-06 DIAGNOSIS — J45909 Unspecified asthma, uncomplicated: Secondary | ICD-10-CM | POA: Diagnosis not present

## 2017-06-06 DIAGNOSIS — I868 Varicose veins of other specified sites: Secondary | ICD-10-CM | POA: Diagnosis not present

## 2017-06-06 DIAGNOSIS — Z886 Allergy status to analgesic agent status: Secondary | ICD-10-CM | POA: Diagnosis not present

## 2017-06-06 DIAGNOSIS — Z9849 Cataract extraction status, unspecified eye: Secondary | ICD-10-CM | POA: Diagnosis not present

## 2017-06-06 DIAGNOSIS — H18053 Posterior corneal pigmentations, bilateral: Secondary | ICD-10-CM | POA: Diagnosis not present

## 2017-06-06 DIAGNOSIS — H578 Other specified disorders of eye and adnexa: Secondary | ICD-10-CM | POA: Diagnosis not present

## 2017-06-06 DIAGNOSIS — Z961 Presence of intraocular lens: Secondary | ICD-10-CM | POA: Diagnosis not present

## 2017-06-06 DIAGNOSIS — I10 Essential (primary) hypertension: Secondary | ICD-10-CM | POA: Diagnosis not present

## 2017-06-06 DIAGNOSIS — Z7951 Long term (current) use of inhaled steroids: Secondary | ICD-10-CM | POA: Diagnosis not present

## 2017-06-06 DIAGNOSIS — Z79899 Other long term (current) drug therapy: Secondary | ICD-10-CM | POA: Diagnosis not present

## 2017-06-06 DIAGNOSIS — H21233 Degeneration of iris (pigmentary), bilateral: Secondary | ICD-10-CM | POA: Diagnosis not present

## 2017-06-13 DIAGNOSIS — H401331 Pigmentary glaucoma, bilateral, mild stage: Secondary | ICD-10-CM | POA: Diagnosis not present

## 2017-06-22 ENCOUNTER — Other Ambulatory Visit: Payer: Self-pay | Admitting: Physician Assistant

## 2017-06-22 DIAGNOSIS — Z1231 Encounter for screening mammogram for malignant neoplasm of breast: Secondary | ICD-10-CM

## 2017-07-12 ENCOUNTER — Ambulatory Visit
Admission: RE | Admit: 2017-07-12 | Discharge: 2017-07-12 | Disposition: A | Discharge status: Home / Self Care | Payer: Medicare Other | Source: Ambulatory Visit | Attending: Physician Assistant | Admitting: Physician Assistant

## 2017-07-12 DIAGNOSIS — Z1231 Encounter for screening mammogram for malignant neoplasm of breast: Secondary | ICD-10-CM | POA: Diagnosis not present

## 2017-07-14 DIAGNOSIS — M9902 Segmental and somatic dysfunction of thoracic region: Secondary | ICD-10-CM | POA: Diagnosis not present

## 2017-07-14 DIAGNOSIS — M545 Low back pain: Secondary | ICD-10-CM | POA: Diagnosis not present

## 2017-07-14 DIAGNOSIS — M9903 Segmental and somatic dysfunction of lumbar region: Secondary | ICD-10-CM | POA: Diagnosis not present

## 2017-07-14 DIAGNOSIS — M546 Pain in thoracic spine: Secondary | ICD-10-CM | POA: Diagnosis not present

## 2017-07-17 DIAGNOSIS — M47816 Spondylosis without myelopathy or radiculopathy, lumbar region: Secondary | ICD-10-CM | POA: Diagnosis not present

## 2017-07-17 DIAGNOSIS — M19011 Primary osteoarthritis, right shoulder: Secondary | ICD-10-CM | POA: Diagnosis not present

## 2017-07-17 DIAGNOSIS — M19012 Primary osteoarthritis, left shoulder: Secondary | ICD-10-CM | POA: Diagnosis not present

## 2017-07-17 DIAGNOSIS — M25551 Pain in right hip: Secondary | ICD-10-CM | POA: Diagnosis not present

## 2017-07-18 DIAGNOSIS — M545 Low back pain: Secondary | ICD-10-CM | POA: Diagnosis not present

## 2017-07-18 DIAGNOSIS — M9902 Segmental and somatic dysfunction of thoracic region: Secondary | ICD-10-CM | POA: Diagnosis not present

## 2017-07-18 DIAGNOSIS — M546 Pain in thoracic spine: Secondary | ICD-10-CM | POA: Diagnosis not present

## 2017-07-18 DIAGNOSIS — M9903 Segmental and somatic dysfunction of lumbar region: Secondary | ICD-10-CM | POA: Diagnosis not present

## 2017-08-07 DIAGNOSIS — M546 Pain in thoracic spine: Secondary | ICD-10-CM | POA: Diagnosis not present

## 2017-08-07 DIAGNOSIS — M545 Low back pain: Secondary | ICD-10-CM | POA: Diagnosis not present

## 2017-08-07 DIAGNOSIS — M9902 Segmental and somatic dysfunction of thoracic region: Secondary | ICD-10-CM | POA: Diagnosis not present

## 2017-08-07 DIAGNOSIS — M9903 Segmental and somatic dysfunction of lumbar region: Secondary | ICD-10-CM | POA: Diagnosis not present

## 2017-08-09 DIAGNOSIS — M545 Low back pain: Secondary | ICD-10-CM | POA: Diagnosis not present

## 2017-08-09 DIAGNOSIS — M9902 Segmental and somatic dysfunction of thoracic region: Secondary | ICD-10-CM | POA: Diagnosis not present

## 2017-08-09 DIAGNOSIS — M9903 Segmental and somatic dysfunction of lumbar region: Secondary | ICD-10-CM | POA: Diagnosis not present

## 2017-08-09 DIAGNOSIS — M546 Pain in thoracic spine: Secondary | ICD-10-CM | POA: Diagnosis not present

## 2017-08-11 DIAGNOSIS — M9902 Segmental and somatic dysfunction of thoracic region: Secondary | ICD-10-CM | POA: Diagnosis not present

## 2017-08-11 DIAGNOSIS — M546 Pain in thoracic spine: Secondary | ICD-10-CM | POA: Diagnosis not present

## 2017-08-11 DIAGNOSIS — M545 Low back pain: Secondary | ICD-10-CM | POA: Diagnosis not present

## 2017-08-11 DIAGNOSIS — M9903 Segmental and somatic dysfunction of lumbar region: Secondary | ICD-10-CM | POA: Diagnosis not present

## 2017-08-14 DIAGNOSIS — M9903 Segmental and somatic dysfunction of lumbar region: Secondary | ICD-10-CM | POA: Diagnosis not present

## 2017-08-14 DIAGNOSIS — M546 Pain in thoracic spine: Secondary | ICD-10-CM | POA: Diagnosis not present

## 2017-08-14 DIAGNOSIS — M545 Low back pain: Secondary | ICD-10-CM | POA: Diagnosis not present

## 2017-08-14 DIAGNOSIS — M9902 Segmental and somatic dysfunction of thoracic region: Secondary | ICD-10-CM | POA: Diagnosis not present

## 2017-08-16 DIAGNOSIS — M9902 Segmental and somatic dysfunction of thoracic region: Secondary | ICD-10-CM | POA: Diagnosis not present

## 2017-08-16 DIAGNOSIS — M545 Low back pain: Secondary | ICD-10-CM | POA: Diagnosis not present

## 2017-08-16 DIAGNOSIS — M546 Pain in thoracic spine: Secondary | ICD-10-CM | POA: Diagnosis not present

## 2017-08-16 DIAGNOSIS — M9903 Segmental and somatic dysfunction of lumbar region: Secondary | ICD-10-CM | POA: Diagnosis not present

## 2017-08-18 DIAGNOSIS — M545 Low back pain: Secondary | ICD-10-CM | POA: Diagnosis not present

## 2017-08-18 DIAGNOSIS — M9903 Segmental and somatic dysfunction of lumbar region: Secondary | ICD-10-CM | POA: Diagnosis not present

## 2017-08-18 DIAGNOSIS — M546 Pain in thoracic spine: Secondary | ICD-10-CM | POA: Diagnosis not present

## 2017-08-18 DIAGNOSIS — M9902 Segmental and somatic dysfunction of thoracic region: Secondary | ICD-10-CM | POA: Diagnosis not present

## 2017-08-22 DIAGNOSIS — M9902 Segmental and somatic dysfunction of thoracic region: Secondary | ICD-10-CM | POA: Diagnosis not present

## 2017-08-22 DIAGNOSIS — M545 Low back pain: Secondary | ICD-10-CM | POA: Diagnosis not present

## 2017-08-22 DIAGNOSIS — M546 Pain in thoracic spine: Secondary | ICD-10-CM | POA: Diagnosis not present

## 2017-08-22 DIAGNOSIS — M9903 Segmental and somatic dysfunction of lumbar region: Secondary | ICD-10-CM | POA: Diagnosis not present

## 2017-08-23 DIAGNOSIS — M9903 Segmental and somatic dysfunction of lumbar region: Secondary | ICD-10-CM | POA: Diagnosis not present

## 2017-08-23 DIAGNOSIS — M545 Low back pain: Secondary | ICD-10-CM | POA: Diagnosis not present

## 2017-08-23 DIAGNOSIS — M9902 Segmental and somatic dysfunction of thoracic region: Secondary | ICD-10-CM | POA: Diagnosis not present

## 2017-08-23 DIAGNOSIS — M546 Pain in thoracic spine: Secondary | ICD-10-CM | POA: Diagnosis not present

## 2017-09-12 DIAGNOSIS — H43392 Other vitreous opacities, left eye: Secondary | ICD-10-CM | POA: Diagnosis not present

## 2017-09-12 DIAGNOSIS — H401331 Pigmentary glaucoma, bilateral, mild stage: Secondary | ICD-10-CM | POA: Diagnosis not present

## 2017-09-14 ENCOUNTER — Other Ambulatory Visit: Payer: Self-pay | Admitting: Plastic Surgery

## 2017-09-14 DIAGNOSIS — N6011 Diffuse cystic mastopathy of right breast: Secondary | ICD-10-CM | POA: Diagnosis not present

## 2017-09-14 DIAGNOSIS — N6012 Diffuse cystic mastopathy of left breast: Secondary | ICD-10-CM | POA: Diagnosis not present

## 2017-09-25 DIAGNOSIS — Z23 Encounter for immunization: Secondary | ICD-10-CM | POA: Diagnosis not present

## 2017-10-11 DIAGNOSIS — M150 Primary generalized (osteo)arthritis: Secondary | ICD-10-CM | POA: Diagnosis not present

## 2017-10-11 DIAGNOSIS — E559 Vitamin D deficiency, unspecified: Secondary | ICD-10-CM | POA: Diagnosis not present

## 2017-10-11 DIAGNOSIS — F411 Generalized anxiety disorder: Secondary | ICD-10-CM | POA: Diagnosis not present

## 2017-10-11 DIAGNOSIS — I10 Essential (primary) hypertension: Secondary | ICD-10-CM | POA: Diagnosis not present

## 2017-10-11 DIAGNOSIS — G2581 Restless legs syndrome: Secondary | ICD-10-CM | POA: Diagnosis not present

## 2017-10-11 DIAGNOSIS — F330 Major depressive disorder, recurrent, mild: Secondary | ICD-10-CM | POA: Diagnosis not present

## 2017-10-11 DIAGNOSIS — E039 Hypothyroidism, unspecified: Secondary | ICD-10-CM | POA: Diagnosis not present

## 2017-10-11 DIAGNOSIS — E041 Nontoxic single thyroid nodule: Secondary | ICD-10-CM | POA: Diagnosis not present

## 2017-10-11 DIAGNOSIS — J302 Other seasonal allergic rhinitis: Secondary | ICD-10-CM | POA: Diagnosis not present

## 2017-10-11 DIAGNOSIS — K219 Gastro-esophageal reflux disease without esophagitis: Secondary | ICD-10-CM | POA: Diagnosis not present

## 2017-10-11 DIAGNOSIS — J3089 Other allergic rhinitis: Secondary | ICD-10-CM | POA: Diagnosis not present

## 2017-10-30 DIAGNOSIS — I10 Essential (primary) hypertension: Secondary | ICD-10-CM | POA: Diagnosis not present

## 2017-10-30 DIAGNOSIS — J0140 Acute pansinusitis, unspecified: Secondary | ICD-10-CM | POA: Diagnosis not present

## 2017-10-30 DIAGNOSIS — J4521 Mild intermittent asthma with (acute) exacerbation: Secondary | ICD-10-CM | POA: Diagnosis not present

## 2017-11-08 DIAGNOSIS — E041 Nontoxic single thyroid nodule: Secondary | ICD-10-CM | POA: Diagnosis not present

## 2017-11-08 DIAGNOSIS — I10 Essential (primary) hypertension: Secondary | ICD-10-CM | POA: Diagnosis not present

## 2017-11-08 DIAGNOSIS — K219 Gastro-esophageal reflux disease without esophagitis: Secondary | ICD-10-CM | POA: Diagnosis not present

## 2017-11-08 DIAGNOSIS — F330 Major depressive disorder, recurrent, mild: Secondary | ICD-10-CM | POA: Diagnosis not present

## 2017-11-08 DIAGNOSIS — F411 Generalized anxiety disorder: Secondary | ICD-10-CM | POA: Diagnosis not present

## 2017-11-08 DIAGNOSIS — M150 Primary generalized (osteo)arthritis: Secondary | ICD-10-CM | POA: Diagnosis not present

## 2017-11-08 DIAGNOSIS — J3089 Other allergic rhinitis: Secondary | ICD-10-CM | POA: Diagnosis not present

## 2017-11-08 DIAGNOSIS — J302 Other seasonal allergic rhinitis: Secondary | ICD-10-CM | POA: Diagnosis not present

## 2017-11-08 DIAGNOSIS — G2581 Restless legs syndrome: Secondary | ICD-10-CM | POA: Diagnosis not present

## 2017-11-08 DIAGNOSIS — Z0000 Encounter for general adult medical examination without abnormal findings: Secondary | ICD-10-CM | POA: Diagnosis not present

## 2017-11-08 DIAGNOSIS — E039 Hypothyroidism, unspecified: Secondary | ICD-10-CM | POA: Diagnosis not present

## 2017-11-08 DIAGNOSIS — E559 Vitamin D deficiency, unspecified: Secondary | ICD-10-CM | POA: Diagnosis not present

## 2017-12-07 DIAGNOSIS — J0140 Acute pansinusitis, unspecified: Secondary | ICD-10-CM | POA: Diagnosis not present

## 2017-12-07 DIAGNOSIS — I10 Essential (primary) hypertension: Secondary | ICD-10-CM | POA: Diagnosis not present

## 2018-01-03 ENCOUNTER — Other Ambulatory Visit: Payer: Self-pay | Admitting: Orthopedic Surgery

## 2018-01-17 ENCOUNTER — Encounter (HOSPITAL_BASED_OUTPATIENT_CLINIC_OR_DEPARTMENT_OTHER)
Admission: RE | Admit: 2018-01-17 | Discharge: 2018-01-17 | Disposition: A | Discharge status: Home / Self Care | Payer: Medicare Other | Source: Ambulatory Visit | Attending: Orthopedic Surgery | Admitting: Orthopedic Surgery

## 2018-01-17 ENCOUNTER — Other Ambulatory Visit: Payer: Self-pay

## 2018-01-17 ENCOUNTER — Encounter (HOSPITAL_BASED_OUTPATIENT_CLINIC_OR_DEPARTMENT_OTHER): Payer: Self-pay | Admitting: *Deleted

## 2018-01-17 DIAGNOSIS — I209 Angina pectoris, unspecified: Secondary | ICD-10-CM | POA: Diagnosis not present

## 2018-01-17 DIAGNOSIS — Z01812 Encounter for preprocedural laboratory examination: Secondary | ICD-10-CM | POA: Diagnosis present

## 2018-01-17 DIAGNOSIS — K219 Gastro-esophageal reflux disease without esophagitis: Secondary | ICD-10-CM | POA: Insufficient documentation

## 2018-01-17 DIAGNOSIS — E039 Hypothyroidism, unspecified: Secondary | ICD-10-CM | POA: Insufficient documentation

## 2018-01-17 DIAGNOSIS — F419 Anxiety disorder, unspecified: Secondary | ICD-10-CM | POA: Diagnosis not present

## 2018-01-17 DIAGNOSIS — Z01810 Encounter for preprocedural cardiovascular examination: Secondary | ICD-10-CM | POA: Diagnosis not present

## 2018-01-17 DIAGNOSIS — I10 Essential (primary) hypertension: Secondary | ICD-10-CM | POA: Insufficient documentation

## 2018-01-17 DIAGNOSIS — Z79899 Other long term (current) drug therapy: Secondary | ICD-10-CM | POA: Diagnosis not present

## 2018-01-17 DIAGNOSIS — M1990 Unspecified osteoarthritis, unspecified site: Secondary | ICD-10-CM | POA: Diagnosis not present

## 2018-01-17 LAB — BASIC METABOLIC PANEL
ANION GAP: 11 (ref 5–15)
BUN: 15 mg/dL (ref 6–20)
CO2: 24 mmol/L (ref 22–32)
Calcium: 9.3 mg/dL (ref 8.9–10.3)
Chloride: 104 mmol/L (ref 101–111)
Creatinine, Ser: 1.07 mg/dL — ABNORMAL HIGH (ref 0.44–1.00)
GFR calc Af Amer: 60 mL/min — ABNORMAL LOW (ref >60)
GFR, EST NON AFRICAN AMERICAN: 52 mL/min — AB (ref >60)
GLUCOSE: 167 mg/dL — AB (ref 65–99)
POTASSIUM: 4.5 mmol/L (ref 3.5–5.1)
Sodium: 139 mmol/L (ref 135–145)

## 2018-01-23 ENCOUNTER — Encounter (HOSPITAL_BASED_OUTPATIENT_CLINIC_OR_DEPARTMENT_OTHER)
Admission: RE | Disposition: A | Discharge status: Home / Self Care | Payer: Self-pay | Source: Ambulatory Visit | Attending: Orthopedic Surgery

## 2018-01-23 ENCOUNTER — Ambulatory Visit (HOSPITAL_BASED_OUTPATIENT_CLINIC_OR_DEPARTMENT_OTHER)
Admission: RE | Admit: 2018-01-23 | Discharge: 2018-01-23 | Disposition: A | Discharge status: Home / Self Care | Payer: Medicare Other | Source: Ambulatory Visit | Attending: Orthopedic Surgery | Admitting: Orthopedic Surgery

## 2018-01-23 ENCOUNTER — Other Ambulatory Visit: Payer: Self-pay

## 2018-01-23 ENCOUNTER — Ambulatory Visit (HOSPITAL_BASED_OUTPATIENT_CLINIC_OR_DEPARTMENT_OTHER): Payer: Medicare Other | Admitting: Certified Registered"

## 2018-01-23 ENCOUNTER — Encounter (HOSPITAL_BASED_OUTPATIENT_CLINIC_OR_DEPARTMENT_OTHER): Payer: Self-pay

## 2018-01-23 DIAGNOSIS — E039 Hypothyroidism, unspecified: Secondary | ICD-10-CM | POA: Insufficient documentation

## 2018-01-23 DIAGNOSIS — Z886 Allergy status to analgesic agent status: Secondary | ICD-10-CM | POA: Diagnosis not present

## 2018-01-23 DIAGNOSIS — Z79899 Other long term (current) drug therapy: Secondary | ICD-10-CM | POA: Insufficient documentation

## 2018-01-23 DIAGNOSIS — M19041 Primary osteoarthritis, right hand: Secondary | ICD-10-CM | POA: Diagnosis not present

## 2018-01-23 DIAGNOSIS — M1990 Unspecified osteoarthritis, unspecified site: Secondary | ICD-10-CM | POA: Diagnosis not present

## 2018-01-23 DIAGNOSIS — M720 Palmar fascial fibromatosis [Dupuytren]: Secondary | ICD-10-CM | POA: Diagnosis present

## 2018-01-23 DIAGNOSIS — I10 Essential (primary) hypertension: Secondary | ICD-10-CM | POA: Insufficient documentation

## 2018-01-23 DIAGNOSIS — F419 Anxiety disorder, unspecified: Secondary | ICD-10-CM | POA: Insufficient documentation

## 2018-01-23 DIAGNOSIS — J449 Chronic obstructive pulmonary disease, unspecified: Secondary | ICD-10-CM | POA: Insufficient documentation

## 2018-01-23 DIAGNOSIS — K219 Gastro-esophageal reflux disease without esophagitis: Secondary | ICD-10-CM | POA: Insufficient documentation

## 2018-01-23 DIAGNOSIS — M19042 Primary osteoarthritis, left hand: Secondary | ICD-10-CM | POA: Insufficient documentation

## 2018-01-23 HISTORY — DX: Cough: R05

## 2018-01-23 HISTORY — DX: Anxiety disorder, unspecified: F41.9

## 2018-01-23 HISTORY — DX: Palmar fascial fibromatosis (dupuytren): M72.0

## 2018-01-23 HISTORY — DX: Chronic cough: R05.3

## 2018-01-23 HISTORY — PX: FASCIECTOMY: SHX6525

## 2018-01-23 SURGERY — FASCIECTOMY, PALM
Anesthesia: Monitor Anesthesia Care | Site: Hand | Laterality: Right

## 2018-01-23 MED ORDER — CHLORHEXIDINE GLUCONATE 4 % EX LIQD: 60.0000 mL | Freq: Once | CUTANEOUS | Status: DC

## 2018-01-23 MED ORDER — PROMETHAZINE HCL 25 MG/ML IJ SOLN: 6.2500 mg | INTRAMUSCULAR | Status: DC | PRN

## 2018-01-23 MED ORDER — MIDAZOLAM HCL 5 MG/5ML IJ SOLN
INTRAMUSCULAR | Status: DC | PRN
  Administered 2018-01-23: 1 mg via INTRAVENOUS

## 2018-01-23 MED ORDER — MIDAZOLAM HCL 2 MG/2ML IJ SOLN
INTRAMUSCULAR | Status: AC
  Filled 2018-01-23: qty 2

## 2018-01-23 MED ORDER — HYDROCODONE-ACETAMINOPHEN 5-325 MG PO TABS: 1.0000 | ORAL_TABLET | Freq: Four times a day (QID) | ORAL | 0 refills | Status: DC | PRN

## 2018-01-23 MED ORDER — FENTANYL CITRATE (PF) 100 MCG/2ML IJ SOLN: 25.0000 ug | INTRAMUSCULAR | Status: DC | PRN

## 2018-01-23 MED ORDER — FENTANYL CITRATE (PF) 100 MCG/2ML IJ SOLN
INTRAMUSCULAR | Status: AC
Start: 2018-01-23 — End: ?
  Filled 2018-01-23: qty 2

## 2018-01-23 MED ORDER — SCOPOLAMINE 1 MG/3DAYS TD PT72: 1.0000 | MEDICATED_PATCH | Freq: Once | TRANSDERMAL | Status: DC | PRN

## 2018-01-23 MED ORDER — FENTANYL CITRATE (PF) 100 MCG/2ML IJ SOLN
INTRAMUSCULAR | Status: AC
  Filled 2018-01-23: qty 2

## 2018-01-23 MED ORDER — MIDAZOLAM HCL 2 MG/2ML IJ SOLN
1.0000 mg | INTRAMUSCULAR | Status: DC | PRN
  Administered 2018-01-23: 2 mg via INTRAVENOUS

## 2018-01-23 MED ORDER — LACTATED RINGERS IV SOLN
INTRAVENOUS | Status: DC
  Administered 2018-01-23: 09:00:00 via INTRAVENOUS

## 2018-01-23 MED ORDER — MEPERIDINE HCL 25 MG/ML IJ SOLN: 6.2500 mg | INTRAMUSCULAR | Status: DC | PRN

## 2018-01-23 MED ORDER — CEFAZOLIN SODIUM-DEXTROSE 2-4 GM/100ML-% IV SOLN
INTRAVENOUS | Status: AC
  Filled 2018-01-23: qty 100

## 2018-01-23 MED ORDER — MIDAZOLAM HCL 2 MG/2ML IJ SOLN: 0.5000 mg | Freq: Once | INTRAMUSCULAR | Status: DC | PRN

## 2018-01-23 MED ORDER — ROPIVACAINE HCL 7.5 MG/ML IJ SOLN
INTRAMUSCULAR | Status: DC | PRN
  Administered 2018-01-23: 30 mL via PERINEURAL

## 2018-01-23 MED ORDER — CEFAZOLIN SODIUM-DEXTROSE 2-4 GM/100ML-% IV SOLN
2.0000 g | INTRAVENOUS | Status: AC
  Administered 2018-01-23: 2 g via INTRAVENOUS

## 2018-01-23 MED ORDER — ONDANSETRON HCL 4 MG/2ML IJ SOLN
INTRAMUSCULAR | Status: DC | PRN
  Administered 2018-01-23: 4 mg via INTRAVENOUS

## 2018-01-23 MED ORDER — FENTANYL CITRATE (PF) 100 MCG/2ML IJ SOLN
50.0000 ug | INTRAMUSCULAR | Status: DC | PRN
  Administered 2018-01-23: 100 ug via INTRAVENOUS

## 2018-01-23 MED ORDER — FENTANYL CITRATE (PF) 100 MCG/2ML IJ SOLN
INTRAMUSCULAR | Status: DC | PRN
  Administered 2018-01-23: 25 ug via INTRAVENOUS

## 2018-01-23 SURGICAL SUPPLY — 41 items
BLADE MINI RND TIP GREEN BEAV (BLADE) ×3 IMPLANT
BLADE SURG 15 STRL LF DISP TIS (BLADE) ×1 IMPLANT
BLADE SURG 15 STRL SS (BLADE) ×3
BNDG CMPR 9X4 STRL LF SNTH (GAUZE/BANDAGES/DRESSINGS) ×1
BNDG COHESIVE 3X5 TAN STRL LF (GAUZE/BANDAGES/DRESSINGS) ×3 IMPLANT
BNDG ESMARK 4X9 LF (GAUZE/BANDAGES/DRESSINGS) ×3 IMPLANT
BNDG GAUZE ELAST 4 BULKY (GAUZE/BANDAGES/DRESSINGS) ×3 IMPLANT
CHLORAPREP W/TINT 26ML (MISCELLANEOUS) ×3 IMPLANT
CORD BIPOLAR FORCEPS 12FT (ELECTRODE) ×3 IMPLANT
COVER BACK TABLE 60X90IN (DRAPES) ×3 IMPLANT
COVER MAYO STAND STRL (DRAPES) ×3 IMPLANT
CUFF TOURNIQUET SINGLE 18IN (TOURNIQUET CUFF) ×2 IMPLANT
DECANTER SPIKE VIAL GLASS SM (MISCELLANEOUS) IMPLANT
DRAPE EXTREMITY T 121X128X90 (DRAPE) ×3 IMPLANT
DRAPE SURG 17X23 STRL (DRAPES) ×3 IMPLANT
GAUZE SPONGE 4X4 12PLY STRL (GAUZE/BANDAGES/DRESSINGS) ×3 IMPLANT
GAUZE XEROFORM 1X8 LF (GAUZE/BANDAGES/DRESSINGS) ×3 IMPLANT
GLOVE BIOGEL PI IND STRL 8.5 (GLOVE) ×1 IMPLANT
GLOVE BIOGEL PI INDICATOR 8.5 (GLOVE) ×2
GLOVE SURG ORTHO 8.0 STRL STRW (GLOVE) ×3 IMPLANT
GOWN STRL REUS W/ TWL LRG LVL3 (GOWN DISPOSABLE) ×1 IMPLANT
GOWN STRL REUS W/TWL LRG LVL3 (GOWN DISPOSABLE) ×3
GOWN STRL REUS W/TWL XL LVL3 (GOWN DISPOSABLE) ×3 IMPLANT
LOOP VESSEL MAXI BLUE (MISCELLANEOUS) ×3 IMPLANT
NDL PRECISIONGLIDE 27X1.5 (NEEDLE) ×1 IMPLANT
NEEDLE PRECISIONGLIDE 27X1.5 (NEEDLE) ×3 IMPLANT
NS IRRIG 1000ML POUR BTL (IV SOLUTION) ×3 IMPLANT
PACK BASIN DAY SURGERY FS (CUSTOM PROCEDURE TRAY) ×3 IMPLANT
PAD CAST 3X4 CTTN HI CHSV (CAST SUPPLIES) ×1 IMPLANT
PADDING CAST COTTON 3X4 STRL (CAST SUPPLIES) ×3
SLEEVE SCD COMPRESS KNEE MED (MISCELLANEOUS) ×3 IMPLANT
SLING ARM FOAM STRAP MED (SOFTGOODS) ×2 IMPLANT
SPLINT PLASTER CAST XFAST 3X15 (CAST SUPPLIES) IMPLANT
SPLINT PLASTER XTRA FASTSET 3X (CAST SUPPLIES)
STOCKINETTE 4X48 STRL (DRAPES) ×3 IMPLANT
SUT ETHILON 4 0 PS 2 18 (SUTURE) ×3 IMPLANT
SUT SILK 2 0 PERMA HAND 18 BK (SUTURE) IMPLANT
SYR BULB 3OZ (MISCELLANEOUS) ×3 IMPLANT
SYR CONTROL 10ML LL (SYRINGE) ×3 IMPLANT
TOWEL OR 17X24 6PK STRL BLUE (TOWEL DISPOSABLE) ×6 IMPLANT
UNDERPAD 30X30 (UNDERPADS AND DIAPERS) ×3 IMPLANT

## 2018-01-23 NOTE — Anesthesia Procedure Notes (Signed)
Anesthesia Regional Block: Axillary brachial plexus block   Pre-Anesthetic Checklist: ,, timeout performed, Correct Patient, Correct Site, Correct Laterality, Correct Procedure, Correct Position, site marked, Risks and benefits discussed,  Surgical consent,  Pre-op evaluation,  At surgeon's request and post-op pain management  Laterality: Right  Prep: chloraprep       Needles:  Injection technique: Single-shot  Needle Type: Stimulator Needle - 40     Needle Length: 4cm  Needle Gauge: 22     Additional Needles:   Procedures:, nerve stimulator,,,,,,,  Narrative:  Start time: 01/23/2018 9:25 AM End time: 01/23/2018 9:30 AM Injection made incrementally with aspirations every 5 mL.  Performed by: Personally  Anesthesiologist: Nolon Nations, MD  Additional Notes: BP cuff, EKG monitors applied. Sedation begun. Nerve location verified with U/S. Anesthetic injected incrementally, slowly , and after neg aspirations under direct u/s guidance. Good perineural spread. Tolerated well.

## 2018-01-23 NOTE — Anesthesia Procedure Notes (Signed)
Procedure Name: MAC Date/Time: 01/23/2018 9:45 AM Performed by: Signe Colt, CRNA Pre-anesthesia Checklist: Patient identified, Emergency Drugs available, Suction available, Patient being monitored and Timeout performed Patient Re-evaluated:Patient Re-evaluated prior to induction Oxygen Delivery Method: Simple face mask

## 2018-01-23 NOTE — Op Note (Signed)
Other Dictation: Dictation Number [id number]

## 2018-01-23 NOTE — Discharge Instructions (Addendum)

## 2018-01-23 NOTE — Brief Op Note (Signed)
01/23/2018  10:21 AM  PATIENT:  [name]  72 y.o. female  PRE-OPERATIVE DIAGNOSIS:  DUPUYTREN'S  CONTRACTURE RIGHT  POST-OPERATIVE DIAGNOSIS:  DUPUYTREN'S CONTRACTURE RIGHT  PROCEDURE:  Procedure(s) with comments: SEGMENTAL FASCIECTOMY RIGHT MIDDLE FINGER (Right) - AXILLARY BLOCK  SURGEON:  Surgeon(s) and Role:    * Daryll Brod, MD - Primary  PHYSICIAN ASSISTANT:   ASSISTANTS: none   ANESTHESIA:   regional  EBL:  none BLOOD ADMINISTERED:none  DRAINS: none   LOCAL MEDICATIONS USED:  NONE  SPECIMEN:  Excision  DISPOSITION OF SPECIMEN:  PATHOLOGY  COUNTS:  YES  TOURNIQUET:   Total Tourniquet Time Documented: Upper Arm (Right) - 19 minutes Total: Upper Arm (Right) - 19 minutes   DICTATION: .Other Dictation: Dictation Number [id number]  PLAN OF CARE: Discharge to home after PACU  PATIENT DISPOSITION:  PACU - hemodynamically stable.

## 2018-01-23 NOTE — Op Note (Signed)
NAME:  [name], [name]             ACCOUNT NO.:  [id number]  MEDICAL RECORD NO.:  [id number]  LOCATION:                                 FACILITY:  PHYSICIAN:  Daryll Brod, M.D.            DATE OF BIRTH:  DATE OF PROCEDURE:  01/23/2018 DATE OF DISCHARGE:                              OPERATIVE REPORT   PLACE OF SURGERY:  [facility].  PREOPERATIVE DIAGNOSIS:  Dupuytren contracture, right middle, right ring fingers.  POSTOPERATIVE DIAGNOSIS:  Dupuytren contracture, right middle, right ring fingers.  OPERATION:  Segmental fasciectomy, right middle, right ring fingers.  SURGEON:  Daryll Brod, M.D.  ANESTHESIA:  Supraclavicular block.  ANESTHESIOLOGISTGlennon Mac.  HISTORY:  The patient is a 72 year old female with a history of Dupuytren contracture with significant contracture to the middle and ring fingers of her right hand.  She has elected to undergo segmental fasciectomy rather than total fasciectomy and/or fasciotomy, Xiaflex injection, or aponeurotomy.  Pre, peri, and postoperative course have been discussed along with risks and complications.  She is aware there is no guarantee to the surgery; the possibility of infection; recurrence of injury to arteries, nerves, tendons; incomplete relief of symptoms; the probability of returned formation of cords on open areas of the skin.  In the preoperative area, the patient was seen, the extremity marked by both the patient and surgeon.  Antibiotic given.  DESCRIPTION OF PROCEDURE:  The patient was brought to the operating room after a supraclavicular block was carried out without difficulty in the preoperative area under the Anesthesia Department.  She was placed in a supine position, prepped and draped using Betadine scrub and solution. A 3-minute dry time was allowed.  Time-out taken, confirming the patient and procedure.  The limb was exsanguinated with an Esmarch bandage. Tourniquet placed high and the arm was  inflated to 250 mmHg.  A transverse incision was made over the proximal aspect of the palmar fascia.  Cord of the middle finger carried down through subcutaneous tissue.  The cord was immediately identified, found to be markedly thickened.  This was dissected free from the underlying neurovascular structures.  Retractors placed and a segment of cord removed and sent to Pathology.  This measured approximately 1 cm in length.  The dissection was carried ulnarly to the ring finger.  This was also similarly transected.  A second incision was made distally on the middle finger at the level of the metacarpophalangeal joint crease.  This was carried down through subcutaneous tissue.  Again, the cord was identified, isolated.  Neurovascular structures identified deeply and protected.  A segment of cord was removed from this segment of the cord to the middle finger.  Separate incision was made to the ring finger, allowing visualization on removal of a second segment of the cord.  A third segment of the cord to the middle finger was then isolated after making an incision in the skin, isolating it down to the lateral digital sheet and radial cord.  This allowed visualization of the neurovascular structures deeply.  Retractors placed along with a Soil scientist to protect the neurovascular structures and a segment of cord removed.  Two segments of cord were sent to Pathology.  The finger came entirely straight at both MP, PIP, DIP joints to each finger.  The angulatory deformity did not correct as expected to.  Wounds were copiously irrigated with saline.  Neurovascular structures were found to be intact and depths of each wound.  The wound over the proximal phalanx of the middle finger was able to be closed with horizontal mattress 4-0 nylon sutures.  The remaining wounds were irrigated and left open.  A sterile compressive dressing was applied.  On deflation of the tourniquet, all fingers  immediately pinked.  She was taken to the recovery room for observation in satisfactory condition.  She will be discharged home to return to the [facility] in 1 week on Norco.          ______________________________ Daryll Brod, M.D.     GK/MEDQ  D:  01/23/2018  T:  01/23/2018  Job:  [id number]

## 2018-01-23 NOTE — Anesthesia Postprocedure Evaluation (Signed)
Anesthesia Post Note  Patient: [name]  Procedure(s) Performed: SEGMENTAL FASCIECTOMY RIGHT MIDDLE FINGER (Right Hand)     Patient location during evaluation: PACU Anesthesia Type: Regional and MAC Level of consciousness: awake and alert, patient cooperative and oriented Pain management: pain level controlled Vital Signs Assessment: post-procedure vital signs reviewed and stable Respiratory status: spontaneous breathing, nonlabored ventilation and respiratory function stable Cardiovascular status: blood pressure returned to baseline and stable Postop Assessment: no apparent nausea or vomiting Anesthetic complications: no    Last Vitals:  Vitals:   01/23/18 0935 01/23/18 1022  BP:  (!) 144/60  Pulse: 80   Resp: (!) 23   Temp:  (!) 36.3 C  SpO2: 100% 96%    Last Pain:  Vitals:   01/23/18 1030  TempSrc:   PainSc: 0-No pain                 Nickey Canedo,E. Hilliary Jock

## 2018-01-23 NOTE — Progress Notes (Signed)
Assisted Dr. Germeroth with right, ultrasound guided, axillary block. Side rails up, monitors on throughout procedure. See vital signs in flow sheet. Tolerated Procedure well. 

## 2018-01-23 NOTE — H&P (Signed)
[name] is an 72 y.o. female.   Chief Complaint: dupuytren's contractureHPI: [name] is a 72 year old right-hand-dominant female who  has Dupuytren's contractures to both hands. This is a recurrence. She has had this for years.  She is not complaining of any numbness or tingling. Is not complaining of any pain. She is complaining of decreased function of both hands left greater than right. She does have a history of positive nerve conductions in the past. But is not complaining of any numbness or tingling at the present time. She has had cataract surgery left foot surgery right foot surgery bunionectomy in the past. Family history is positive diabetes heart disease high blood pressure arthritis per      Past Medical History:  Diagnosis Date  . Allergic rhinitis   . Anxiety   . Arthritis   . Asthma   . Chronic cough   . COPD (chronic obstructive pulmonary disease) ([place])   . Dupuytren contracture    RMF  . GERD (gastroesophageal reflux disease)   . Hypertension   . Hypothyroidism     Past Surgical History:  Procedure Laterality Date  . BLADDER TACK     X 2  . BUNIONECTOMY     RIGHT FOOT ARCH REPAIRED AS WELL  . DILATION AND CURETTAGE OF UTERUS     X 3  . KNEE ARTHROSCOPY     RIGHT  . TOTAL ABDOMINAL HYSTERECTOMY      Family History  Problem Relation Age of Onset  . Pneumonia Father   . Arthritis Mother    Social History:  reports that  has never smoked. she has never used smokeless tobacco. She reports that she drinks alcohol. She reports that she does not use drugs.  Allergies:  Allergies  Allergen Reactions  . Naproxen     Medications Prior to Admission  Medication Sig Dispense Refill  . albuterol (PROAIR HFA) 108 (90 BASE) MCG/ACT inhaler Inhale 2 puffs into the lungs every 4 (four) hours as needed for wheezing or shortness of breath. 2 Inhaler prn  . albuterol (PROVENTIL) (2.5 MG/3ML) 0.083% nebulizer solution Take 3 mLs (2.5 mg total) by nebulization  every 6 (six) hours as needed for wheezing or shortness of breath. 1080 mL 3  . ALPRAZolam (XANAX) 0.25 MG tablet Take 1 tablet by mouth At bedtime.    Marland Kitchen azelastine (ASTELIN) 0.1 % nasal spray Place into both nostrils 2 (two) times daily. Use in each nostril as directed    . budesonide (PULMICORT) 0.25 MG/2ML nebulizer solution Take 2 mLs (0.25 mg total) by nebulization 2 (two) times daily. 180 mL 3  . celecoxib (CELEBREX) 200 MG capsule Take 200 mg by mouth daily.      . Cholecalciferol (VITAMIN D3) 50000 units TABS Take by mouth.    . Coenzyme Q10 (COQ10) 100 MG CAPS Take by mouth. Reported on 01/01/2016    . fluticasone (FLONASE) 50 MCG/ACT nasal spray Place 2 sprays into both nostrils daily. 16 g 3  . Fluticasone-Salmeterol (ADVAIR DISKUS) 250-50 MCG/DOSE AEPB 1 puff and rinse well, twice daily 60 each 3  . latanoprost (XALATAN) 0.005 % ophthalmic solution   2  . levocetirizine (XYZAL) 5 MG tablet Take 5 mg by mouth every evening.    Marland Kitchen levothyroxine (SYNTHROID, LEVOTHROID) 50 MCG tablet Take 1 tablet by mouth daily.    Marland Kitchen losartan-hydrochlorothiazide (HYZAAR) 100-12.5 MG per tablet Take 1 tablet by mouth daily.      . Multiple Vitamin (MULTIVITAMIN) tablet Take 1 tablet  by mouth daily.    . Omega-3 Fatty Acids (SUPER OMEGA 3 EPA/DHA) 1000 MG CAPS Take by mouth.    . pantoprazole (PROTONIX) 40 MG tablet TK 1 T PO QD  0  . pregabalin (LYRICA) 75 MG capsule Take 75 mg by mouth 2 (two) times daily.    . ranitidine (ZANTAC) 300 MG tablet     . rOPINIRole (REQUIP) 2 MG tablet   1  . venlafaxine XR (EFFEXOR-XR) 75 MG 24 hr capsule   0    No results found for this or any previous visit (from the past 25 hour(s)).  No results found.   Pertinent items are noted in HPI.  Height 5\' 2"  (1.575 m), weight 80.7 kg (178 lb).  General appearance: alert, cooperative and appears stated age Head: Normocephalic, without obvious abnormality Neck: no JVD Resp: clear to auscultation bilaterally Cardio:  regular rate and rhythm, S1, S2 normal, no murmur, click, rub or gallop GI: soft, non-tender; bowel sounds normal; no masses,  no organomegaly Extremities: contracture right middle and ring fingers Pulses: 2+ and symmetric Skin: Skin color, texture, turgor normal. No rashes or lesions Neurologic: Grossly normal Incision/Wound: na  Assessment/Plan Assessment:  1. Dupuytren's contracture  2. Primary osteoarthritis of both hands    Plan: We have had a long discussion in the past with respect to various treatment alternatives. We recommend a fasciectomy of the right middle and ring finger this can be either complete or segmental. Pre-peri-and postoperative course been discussed along with risks and complications. She is aware there is no guarantee to the surgery the possibility of infection recurrence injury to arteries nerves tendons deafness incomplete relief symptoms dystrophy. She is advised potential for loss of finger. She is advised of the PIP can contracture and difficulty in maintaining a full extension on the PIP joints especially to her left small finger. She would like to proceed she is scheduled for fasciectomy segmental in nature to the middle and ring fingers on her right hand as an outpatient under regional anesthesia.       [name] R 01/23/2018, 8:30 AM

## 2018-01-23 NOTE — Anesthesia Preprocedure Evaluation (Addendum)
Anesthesia Evaluation  Patient identified by MRN, date of birth, ID band Patient awake    Reviewed: Allergy & Precautions, NPO status , Patient's Chart, lab work & pertinent test results  History of Anesthesia Complications Negative for: history of anesthetic complications  Airway Mallampati: II  TM Distance: >3 FB Neck ROM: Full    Dental  (+) Dental Advisory Given   Pulmonary COPD,  COPD inhaler,    breath sounds clear to auscultation       Cardiovascular hypertension, Pt. on medications + angina  Rhythm:Regular Rate:Normal     Neuro/Psych Anxiety negative neurological ROS     GI/Hepatic Neg liver ROS, GERD  Medicated and Poorly Controlled,  Endo/Other  Hypothyroidism   Renal/GU negative Renal ROS     Musculoskeletal  (+) Arthritis ,   Abdominal   Peds  Hematology   Anesthesia Other Findings   Reproductive/Obstetrics                             Anesthesia Physical Anesthesia Plan  ASA: II  Anesthesia Plan: Regional and MAC   Post-op Pain Management: GA combined w/ Regional for post-op pain   Induction: Intravenous  PONV Risk Score and Plan: 3 and Ondansetron, Dexamethasone, Treatment may vary due to age or medical condition and Midazolam  Airway Management Planned: Natural Airway and Nasal Cannula  Additional Equipment:   Intra-op Plan:   Post-operative Plan:   Informed Consent: I have reviewed the patients History and Physical, chart, labs and discussed the procedure including the risks, benefits and alternatives for the proposed anesthesia with the patient or authorized representative who has indicated his/her understanding and acceptance.   Dental advisory given  Plan Discussed with: CRNA and Surgeon  Anesthesia Plan Comments: (Plan routine monitors, axillary block )       Anesthesia Quick Evaluation

## 2018-01-23 NOTE — Transfer of Care (Signed)
Immediate Anesthesia Transfer of Care Note  Patient: [name]  Procedure(s) Performed: SEGMENTAL FASCIECTOMY RIGHT MIDDLE FINGER (Right Hand)  Patient Location: PACU  Anesthesia Type:MAC combined with regional for post-op pain  Level of Consciousness: awake, alert , oriented and patient cooperative  Airway & Oxygen Therapy: Patient Spontanous Breathing and Patient connected to face mask oxygen  Post-op Assessment: Report given to RN and Post -op Vital signs reviewed and stable  Post vital signs: Reviewed and stable  Last Vitals:  Vitals:   01/23/18 0930 01/23/18 0935  BP: (!) 154/79   Pulse: 76 80  Resp: 14 (!) 23  Temp:    SpO2: 100% 100%    Last Pain:  Vitals:   01/23/18 0835  TempSrc: Oral         Complications: No apparent anesthesia complications

## 2018-01-24 ENCOUNTER — Encounter (HOSPITAL_BASED_OUTPATIENT_CLINIC_OR_DEPARTMENT_OTHER): Payer: Self-pay | Admitting: Orthopedic Surgery

## 2018-02-13 ENCOUNTER — Emergency Department (HOSPITAL_COMMUNITY): Payer: Medicare Other

## 2018-02-13 ENCOUNTER — Emergency Department (HOSPITAL_COMMUNITY)
Admission: EM | Admit: 2018-02-13 | Discharge: 2018-02-13 | Disposition: A | Discharge status: Home / Self Care | Payer: Medicare Other | Attending: Emergency Medicine | Admitting: Emergency Medicine

## 2018-02-13 ENCOUNTER — Encounter (HOSPITAL_COMMUNITY): Payer: Self-pay | Admitting: Emergency Medicine

## 2018-02-13 ENCOUNTER — Other Ambulatory Visit: Payer: Self-pay

## 2018-02-13 DIAGNOSIS — I10 Essential (primary) hypertension: Secondary | ICD-10-CM | POA: Diagnosis not present

## 2018-02-13 DIAGNOSIS — Z79899 Other long term (current) drug therapy: Secondary | ICD-10-CM | POA: Insufficient documentation

## 2018-02-13 DIAGNOSIS — E039 Hypothyroidism, unspecified: Secondary | ICD-10-CM | POA: Insufficient documentation

## 2018-02-13 DIAGNOSIS — J4520 Mild intermittent asthma, uncomplicated: Secondary | ICD-10-CM | POA: Diagnosis not present

## 2018-02-13 DIAGNOSIS — J449 Chronic obstructive pulmonary disease, unspecified: Secondary | ICD-10-CM | POA: Insufficient documentation

## 2018-02-13 DIAGNOSIS — J40 Bronchitis, not specified as acute or chronic: Secondary | ICD-10-CM

## 2018-02-13 DIAGNOSIS — R0602 Shortness of breath: Secondary | ICD-10-CM | POA: Diagnosis present

## 2018-02-13 LAB — COMPREHENSIVE METABOLIC PANEL
ALK PHOS: 98 U/L (ref 38–126)
ALT: 18 U/L (ref 14–54)
AST: 22 U/L (ref 15–41)
Albumin: 3.8 g/dL (ref 3.5–5.0)
Anion gap: 11 (ref 5–15)
BILIRUBIN TOTAL: 0.3 mg/dL (ref 0.3–1.2)
BUN: 23 mg/dL — ABNORMAL HIGH (ref 6–20)
CALCIUM: 9.7 mg/dL (ref 8.9–10.3)
CO2: 23 mmol/L (ref 22–32)
Chloride: 102 mmol/L (ref 101–111)
Creatinine, Ser: 1.27 mg/dL — ABNORMAL HIGH (ref 0.44–1.00)
GFR calc non Af Amer: 42 mL/min — ABNORMAL LOW (ref >60)
GFR, EST AFRICAN AMERICAN: 48 mL/min — AB (ref >60)
Glucose, Bld: 213 mg/dL — ABNORMAL HIGH (ref 65–99)
Potassium: 4.6 mmol/L (ref 3.5–5.1)
Sodium: 136 mmol/L (ref 135–145)
TOTAL PROTEIN: 6.5 g/dL (ref 6.5–8.1)

## 2018-02-13 LAB — CBC WITH DIFFERENTIAL/PLATELET
BASOS ABS: 0 10*3/uL (ref 0.0–0.1)
Basophils Relative: 0 %
Eosinophils Absolute: 0.3 10*3/uL (ref 0.0–0.7)
Eosinophils Relative: 2 %
HCT: 38.6 % (ref 36.0–46.0)
HEMOGLOBIN: 12.8 g/dL (ref 12.0–15.0)
Lymphocytes Relative: 53 %
Lymphs Abs: 7.4 10*3/uL — ABNORMAL HIGH (ref 0.7–4.0)
MCH: 28.9 pg (ref 26.0–34.0)
MCHC: 33.2 g/dL (ref 30.0–36.0)
MCV: 87.1 fL (ref 78.0–100.0)
Monocytes Absolute: 0.6 10*3/uL (ref 0.1–1.0)
Monocytes Relative: 4 %
NEUTROS PCT: 41 %
Neutro Abs: 5.8 10*3/uL (ref 1.7–7.7)
Platelets: 320 10*3/uL (ref 150–400)
RBC: 4.43 MIL/uL (ref 3.87–5.11)
RDW: 15.6 % — ABNORMAL HIGH (ref 11.5–15.5)
WBC: 14.1 10*3/uL — AB (ref 4.0–10.5)

## 2018-02-13 LAB — I-STAT TROPONIN, ED: Troponin i, poc: 0.01 ng/mL (ref 0.00–0.08)

## 2018-02-13 LAB — URINALYSIS, ROUTINE W REFLEX MICROSCOPIC
Bacteria, UA: NONE SEEN
Bilirubin Urine: NEGATIVE
GLUCOSE, UA: NEGATIVE mg/dL
HGB URINE DIPSTICK: NEGATIVE
KETONES UR: 5 mg/dL — AB
NITRITE: NEGATIVE
PROTEIN: NEGATIVE mg/dL
Specific Gravity, Urine: 1.032 — ABNORMAL HIGH (ref 1.005–1.030)
pH: 5 (ref 5.0–8.0)

## 2018-02-13 MED ORDER — IPRATROPIUM-ALBUTEROL 0.5-2.5 (3) MG/3ML IN SOLN
3.0000 mL | Freq: Once | RESPIRATORY_TRACT | Status: AC
  Administered 2018-02-13: 3 mL via RESPIRATORY_TRACT
  Filled 2018-02-13: qty 3

## 2018-02-13 MED ORDER — ALBUTEROL SULFATE HFA 108 (90 BASE) MCG/ACT IN AERS: 1.0000 | INHALATION_SPRAY | Freq: Four times a day (QID) | RESPIRATORY_TRACT | 1 refills | Status: DC | PRN

## 2018-02-13 MED ORDER — ALPRAZOLAM 0.5 MG PO TABS
0.2500 mg | ORAL_TABLET | Freq: Once | ORAL | Status: AC
  Administered 2018-02-13: 0.25 mg via ORAL
  Filled 2018-02-13: qty 1

## 2018-02-13 MED ORDER — PREDNISONE 20 MG PO TABS
60.0000 mg | ORAL_TABLET | Freq: Once | ORAL | Status: AC
Start: 2018-02-13 — End: 2018-02-13
  Administered 2018-02-13: 60 mg via ORAL
  Filled 2018-02-13: qty 3

## 2018-02-13 MED ORDER — PREDNISONE 10 MG PO TABS: 40.0000 mg | ORAL_TABLET | Freq: Every day | ORAL | 0 refills | Status: AC

## 2018-02-13 MED ORDER — ROPINIROLE HCL 1 MG PO TABS
2.0000 mg | ORAL_TABLET | Freq: Once | ORAL | Status: AC
  Administered 2018-02-13: 2 mg via ORAL
  Filled 2018-02-13: qty 2

## 2018-02-13 MED ORDER — DM-GUAIFENESIN ER 30-600 MG PO TB12: 1.0000 | ORAL_TABLET | Freq: Two times a day (BID) | ORAL | 1 refills | Status: DC

## 2018-02-13 NOTE — ED Provider Notes (Signed)
[facility] EMERGENCY DEPARTMENT Provider Note   CSN: [id number] Arrival date & time: 02/13/18  1419     History   Chief Complaint Chief Complaint  Patient presents with  . Shortness of Breath    HPI [name] is a 72 y.o. female.  Patient with about 1-1/2 weeks worth of cough congestion.  Has had trouble with sinus congestion once time.  Has been on 3 different antibiotics without any significant improvement.  Patient has a history of asthma.  Does have albuterol inhaler and is just coming off a tapered course of steroids.  Patient does understand why she is still having with this cough.  And feels short of breath.  No history of fevers nausea vomiting or diarrhea no body aches.  Does not seem to be flulike.  She is experiencing anterior chest pain with the cough.  And only with the cough.      Past Medical History:  Diagnosis Date  . Allergic rhinitis   . Anxiety   . Arthritis   . Asthma   . Chronic cough   . COPD (chronic obstructive pulmonary disease) ([place])   . Dupuytren contracture    RMF  . GERD (gastroesophageal reflux disease)   . Hypertension   . Hypothyroidism     Patient Active Problem List   Diagnosis Date Noted  . Cyst of right orbit 01/01/2016  . Left facial pain 11/03/2015  . Acute maxillary sinusitis 03/08/2015  . HYPOTHYROIDISM 01/23/2008  . Seasonal and perennial allergic rhinitis 01/23/2008  . Asthma, mild intermittent 01/23/2008  . Esophageal reflux 01/23/2008  . HYPERTENSION NEC 01/23/2008    Past Surgical History:  Procedure Laterality Date  . BLADDER TACK     X 2  . BUNIONECTOMY     RIGHT FOOT ARCH REPAIRED AS WELL  . DILATION AND CURETTAGE OF UTERUS     X 3  . FASCIECTOMY Right 01/23/2018   Procedure: SEGMENTAL FASCIECTOMY RIGHT MIDDLE FINGER;  Surgeon: Daryll Brod, MD;  Location: [facility];  Service: Orthopedics;  Laterality: Right;  AXILLARY BLOCK  . KNEE ARTHROSCOPY     RIGHT  . TOTAL  ABDOMINAL HYSTERECTOMY      OB History    No data available       Home Medications    Prior to Admission medications   Medication Sig Start Date End Date Taking? Authorizing Provider  albuterol (PROAIR HFA) 108 (90 BASE) MCG/ACT inhaler Inhale 2 puffs into the lungs every 4 (four) hours as needed for wheezing or shortness of breath. 11/20/14   Deneise Lever, MD  albuterol (PROVENTIL HFA;VENTOLIN HFA) 108 (90 Base) MCG/ACT inhaler Inhale 1-2 puffs into the lungs every 6 (six) hours as needed for wheezing or shortness of breath. 02/13/18   Fredia Sorrow, MD  albuterol (PROVENTIL) (2.5 MG/3ML) 0.083% nebulizer solution Take 3 mLs (2.5 mg total) by nebulization every 6 (six) hours as needed for wheezing or shortness of breath. 11/26/14   Deneise Lever, MD  ALPRAZolam Duanne Moron) 0.25 MG tablet Take 1 tablet by mouth At bedtime. 09/29/11   [provider]  azelastine (ASTELIN) 0.1 % nasal spray Place into both nostrils 2 (two) times daily. Use in each nostril as directed    [provider]  budesonide (PULMICORT) 0.25 MG/2ML nebulizer solution Take 2 mLs (0.25 mg total) by nebulization 2 (two) times daily. 11/26/14   Baird Lyons D, MD  celecoxib (CELEBREX) 200 MG capsule Take 200 mg by mouth daily.  [provider]  Cholecalciferol (VITAMIN D3) 50000 units TABS Take by mouth.    [provider]  Coenzyme Q10 (COQ10) 100 MG CAPS Take by mouth. Reported on 01/01/2016    [provider]  dextromethorphan-guaiFENesin (MUCINEX DM) 30-600 MG 12hr tablet Take 1 tablet by mouth 2 (two) times daily. 02/13/18   Fredia Sorrow, MD  fluticasone (FLONASE) 50 MCG/ACT nasal spray Place 2 sprays into both nostrils daily. 03/12/15   Baird Lyons D, MD  Fluticasone-Salmeterol (ADVAIR DISKUS) 250-50 MCG/DOSE AEPB 1 puff and rinse well, twice daily 03/12/15   Baird Lyons D, MD  HYDROcodone-acetaminophen (NORCO) 5-325 MG tablet Take 1 tablet by mouth every 6  (six) hours as needed. 01/23/18   Daryll Brod, MD  latanoprost (XALATAN) 0.005 % ophthalmic solution  08/18/15   [provider]  levocetirizine (XYZAL) 5 MG tablet Take 5 mg by mouth every evening.    [provider]  levothyroxine (SYNTHROID, LEVOTHROID) 50 MCG tablet Take 1 tablet by mouth daily. 09/02/13   [provider]  losartan-hydrochlorothiazide (HYZAAR) 100-12.5 MG per tablet Take 1 tablet by mouth daily.      [provider]  Multiple Vitamin (MULTIVITAMIN) tablet Take 1 tablet by mouth daily.    [provider]  Omega-3 Fatty Acids (SUPER OMEGA 3 EPA/DHA) 1000 MG CAPS Take by mouth.    [provider]  pantoprazole (PROTONIX) 40 MG tablet TK 1 T PO QD 08/29/15   [provider]  predniSONE (DELTASONE) 10 MG tablet Take 4 tablets (40 mg total) by mouth daily for 5 days. 02/13/18 02/18/18  Fredia Sorrow, MD  pregabalin (LYRICA) 75 MG capsule Take 75 mg by mouth 2 (two) times daily.    [provider]  ranitidine (ZANTAC) 300 MG tablet  11/03/14   [provider]  rOPINIRole (REQUIP) 2 MG tablet  11/10/14   [provider]  venlafaxine XR (EFFEXOR-XR) 75 MG 24 hr capsule  10/07/14   [provider]    Family History Family History  Problem Relation Age of Onset  . Pneumonia Father   . Arthritis Mother     Social History Social History   Tobacco Use  . Smoking status: Never Smoker  . Smokeless tobacco: Never Used  Substance Use Topics  . Alcohol use: Yes    Alcohol/week: 0.0 oz    Comment: social  . Drug use: No     Allergies   Naproxen   Review of Systems Review of Systems  Constitutional: Negative for fever.  HENT: Positive for congestion. Negative for sore throat.   Eyes: Negative for redness.  Respiratory: Positive for cough and shortness of breath.   Cardiovascular: Positive for chest pain.  Gastrointestinal: Negative for abdominal pain, nausea and vomiting.    Genitourinary: Negative for dysuria.  Musculoskeletal: Negative for back pain.  Skin: Negative for rash.  Neurological: Negative for headaches.  Hematological: Does not bruise/bleed easily.  Psychiatric/Behavioral: Negative for confusion.     Physical Exam Updated Vital Signs BP (!) 134/49   Pulse 84   Temp 97.9 F (36.6 C) (Oral)   Resp (!) 21   SpO2 92%   Physical Exam  Constitutional: She is oriented to person, place, and time. She appears well-developed and well-nourished. No distress.  HENT:  Mouth/Throat: Oropharynx is clear and moist.  Eyes: Conjunctivae and EOM are normal. Pupils are equal, round, and reactive to light.  Neck: Neck supple.  Cardiovascular: Normal rate and regular rhythm.  Pulmonary/Chest: Effort normal  and breath sounds normal. She has no wheezes.  Abdominal: Soft. Bowel sounds are normal. There is no tenderness.  Musculoskeletal: Normal range of motion. She exhibits no edema.  Neurological: She is alert and oriented to person, place, and time. No cranial nerve deficit or sensory deficit. She exhibits normal muscle tone. Coordination normal.  Skin: Skin is warm. No rash noted.  Nursing note and vitals reviewed.    ED Treatments / Results  Labs (all labs ordered are listed, but only abnormal results are displayed) Labs Reviewed  COMPREHENSIVE METABOLIC PANEL - Abnormal; Notable for the following components:      Result Value   Glucose, Bld 213 (*)    BUN 23 (*)    Creatinine, Ser 1.27 (*)    GFR calc non Af Amer 42 (*)    GFR calc Af Amer 48 (*)    All other components within normal limits  CBC WITH DIFFERENTIAL/PLATELET - Abnormal; Notable for the following components:   WBC 14.1 (*)    RDW 15.6 (*)    Lymphs Abs 7.4 (*)    All other components within normal limits  URINALYSIS, ROUTINE W REFLEX MICROSCOPIC - Abnormal; Notable for the following components:   Color, Urine AMBER (*)    APPearance HAZY (*)    Specific Gravity, Urine 1.032  (*)    Ketones, ur 5 (*)    Leukocytes, UA MODERATE (*)    Squamous Epithelial / LPF 0-5 (*)    All other components within normal limits  URINE CULTURE  I-STAT TROPONIN, ED    EKG  EKG Interpretation None       Radiology Dg Chest 2 View  Result Date: 02/13/2018 CLINICAL DATA:  Chest pain and cough. EXAM: CHEST  2 VIEW COMPARISON:  Chest x-ray dated November 20, 2014. FINDINGS: The heart size and mediastinal contours are within normal limits. Both lungs are clear. The visualized skeletal structures are unremarkable. IMPRESSION: No active cardiopulmonary disease. Electronically Signed   By: Titus Dubin M.D.   On: 02/13/2018 16:00    Procedures Procedures (including critical care time)  Medications Ordered in ED Medications  ipratropium-albuterol (DUONEB) 0.5-2.5 (3) MG/3ML nebulizer solution 3 mL (3 mLs Nebulization Given 02/13/18 1853)  predniSONE (DELTASONE) tablet 60 mg (60 mg Oral Given 02/13/18 1916)  rOPINIRole (REQUIP) tablet 2 mg (2 mg Oral Given 02/13/18 1939)  ALPRAZolam Duanne Moron) tablet 0.25 mg (0.25 mg Oral Given 02/13/18 1939)     Initial Impression / Assessment and Plan / ED Course  I have reviewed the triage vital signs and the nursing notes.  Pertinent labs & imaging results that were available during my care of the patient were reviewed by me and considered in my medical decision making (see chart for details).    Patient symptoms of late seem to be consistent with a bronchitis.  Patient given albuterol Atrovent nebulizer and her breathing felt much smoother to her.  Never heard any wheezing.  Patient also given a dose of 60 mg of prednisone.  Chest x-ray negative for pneumonia.  Feel the patient's problem is viral bronchitis.  Will put her on another 5-day course of prednisone.  We will have her use her albuterol nebulizer or inhaler and will start her on Mucinex DM.  Patient nontoxic no acute distress.  Labs normal urinalysis with questionable UTI urinary  tract infection.  Patient really not with any symptoms.  And has been on 3 different antibiotics over the last several weeks.  Will send culture.  Patient will be treated only if culture positive.  She is aware that she will be notified if it is positive.   Final Clinical Impressions(s) / ED Diagnoses   Final diagnoses:  Bronchitis    ED Discharge Orders        Ordered    predniSONE (DELTASONE) 10 MG tablet  Daily     02/13/18 2031    dextromethorphan-guaiFENesin (MUCINEX DM) 30-600 MG 12hr tablet  2 times daily     02/13/18 2031    albuterol (PROVENTIL HFA;VENTOLIN HFA) 108 (90 Base) MCG/ACT inhaler  Every 6 hours PRN     02/13/18 2033       Fredia Sorrow, MD 02/13/18 2050

## 2018-02-13 NOTE — ED Triage Notes (Signed)
Patient coming from home; chest pain with coughing; VS; RA sats 100%RA. Clean EKG per EMS. VSS. No dyspnea noted. Patient states she gets winded with mild exertion and she is worn out from all the coughing.

## 2018-02-13 NOTE — Discharge Instructions (Signed)
Chest x-ray negative.  Use albuterol extra inhaler provided or your nebulizer every 6 hours until better.  Take the prednisone as directed for the next 5 days starting tomorrow.  Return for any new or worse symptoms or follow-up with your doctor.

## 2018-02-13 NOTE — ED Notes (Signed)
On 3rd round of antibiotics for URI and has seen private md for same-- pt is short of breath-- difficulty speaking in complete sentence, pt sats 100% on RA

## 2018-02-14 LAB — PATHOLOGIST SMEAR REVIEW

## 2018-02-15 LAB — URINE CULTURE: Culture: NO GROWTH

## 2018-02-19 ENCOUNTER — Telehealth: Payer: Self-pay | Admitting: Internal Medicine

## 2018-02-19 NOTE — Telephone Encounter (Signed)
Pt scheduled to see SG Thursday at 11:30.  Nothing further needed.

## 2018-02-19 NOTE — Telephone Encounter (Signed)
Pt is currently scheduled for a HFU with CY on 03/01/18 and is wanting to come in sooner for an appt this week.  Pt is wanting to know if it will be okay for her to see a NP per [facility].  Dr. Annamaria Boots, please advise on this for pt. Thanks!

## 2018-02-19 NOTE — Telephone Encounter (Signed)
Yes ok to see NP

## 2018-02-21 ENCOUNTER — Other Ambulatory Visit: Payer: Self-pay

## 2018-02-21 ENCOUNTER — Emergency Department (HOSPITAL_BASED_OUTPATIENT_CLINIC_OR_DEPARTMENT_OTHER): Payer: Medicare Other

## 2018-02-21 ENCOUNTER — Encounter (HOSPITAL_BASED_OUTPATIENT_CLINIC_OR_DEPARTMENT_OTHER): Payer: Self-pay

## 2018-02-21 ENCOUNTER — Inpatient Hospital Stay (HOSPITAL_BASED_OUTPATIENT_CLINIC_OR_DEPARTMENT_OTHER)
Admission: EM | Admit: 2018-02-21 | Discharge: 2018-02-24 | DRG: 176 | Disposition: A | Discharge status: Home / Self Care | Payer: Medicare Other | Attending: Internal Medicine | Admitting: Internal Medicine

## 2018-02-21 DIAGNOSIS — K219 Gastro-esophageal reflux disease without esophagitis: Secondary | ICD-10-CM | POA: Diagnosis present

## 2018-02-21 DIAGNOSIS — Z886 Allergy status to analgesic agent status: Secondary | ICD-10-CM

## 2018-02-21 DIAGNOSIS — Z7951 Long term (current) use of inhaled steroids: Secondary | ICD-10-CM

## 2018-02-21 DIAGNOSIS — I2699 Other pulmonary embolism without acute cor pulmonale: Secondary | ICD-10-CM | POA: Diagnosis not present

## 2018-02-21 DIAGNOSIS — Z79890 Hormone replacement therapy (postmenopausal): Secondary | ICD-10-CM

## 2018-02-21 DIAGNOSIS — R0602 Shortness of breath: Secondary | ICD-10-CM | POA: Diagnosis not present

## 2018-02-21 DIAGNOSIS — R7989 Other specified abnormal findings of blood chemistry: Secondary | ICD-10-CM

## 2018-02-21 DIAGNOSIS — R778 Other specified abnormalities of plasma proteins: Secondary | ICD-10-CM

## 2018-02-21 DIAGNOSIS — J3089 Other allergic rhinitis: Secondary | ICD-10-CM

## 2018-02-21 DIAGNOSIS — D72829 Elevated white blood cell count, unspecified: Secondary | ICD-10-CM

## 2018-02-21 DIAGNOSIS — J4530 Mild persistent asthma, uncomplicated: Secondary | ICD-10-CM | POA: Diagnosis present

## 2018-02-21 DIAGNOSIS — R0982 Postnasal drip: Secondary | ICD-10-CM | POA: Diagnosis present

## 2018-02-21 DIAGNOSIS — E1122 Type 2 diabetes mellitus with diabetic chronic kidney disease: Secondary | ICD-10-CM

## 2018-02-21 DIAGNOSIS — J302 Other seasonal allergic rhinitis: Secondary | ICD-10-CM | POA: Diagnosis present

## 2018-02-21 DIAGNOSIS — I10 Essential (primary) hypertension: Secondary | ICD-10-CM | POA: Diagnosis present

## 2018-02-21 DIAGNOSIS — N183 Chronic kidney disease, stage 3 unspecified: Secondary | ICD-10-CM

## 2018-02-21 DIAGNOSIS — F419 Anxiety disorder, unspecified: Secondary | ICD-10-CM | POA: Diagnosis present

## 2018-02-21 DIAGNOSIS — J4520 Mild intermittent asthma, uncomplicated: Secondary | ICD-10-CM | POA: Diagnosis present

## 2018-02-21 DIAGNOSIS — R0902 Hypoxemia: Secondary | ICD-10-CM | POA: Diagnosis present

## 2018-02-21 DIAGNOSIS — I129 Hypertensive chronic kidney disease with stage 1 through stage 4 chronic kidney disease, or unspecified chronic kidney disease: Secondary | ICD-10-CM | POA: Diagnosis present

## 2018-02-21 DIAGNOSIS — E039 Hypothyroidism, unspecified: Secondary | ICD-10-CM | POA: Diagnosis present

## 2018-02-21 HISTORY — DX: Major depressive disorder, single episode, unspecified: F32.9

## 2018-02-21 HISTORY — DX: Other pulmonary embolism without acute cor pulmonale: I26.99

## 2018-02-21 HISTORY — DX: Depression, unspecified: F32.A

## 2018-02-21 HISTORY — DX: Allergic rhinitis, unspecified: J30.9

## 2018-02-21 HISTORY — DX: Mild persistent asthma, uncomplicated: J45.30

## 2018-02-21 LAB — COMPREHENSIVE METABOLIC PANEL
ALBUMIN: 3.8 g/dL (ref 3.5–5.0)
ALK PHOS: 94 U/L (ref 38–126)
ALT: 23 U/L (ref 14–54)
AST: 28 U/L (ref 15–41)
Anion gap: 14 (ref 5–15)
BUN: 23 mg/dL — AB (ref 6–20)
CALCIUM: 9.3 mg/dL (ref 8.9–10.3)
CO2: 18 mmol/L — AB (ref 22–32)
CREATININE: 1.26 mg/dL — AB (ref 0.44–1.00)
Chloride: 104 mmol/L (ref 101–111)
GFR calc Af Amer: 49 mL/min — ABNORMAL LOW (ref >60)
GFR calc non Af Amer: 42 mL/min — ABNORMAL LOW (ref >60)
GLUCOSE: 313 mg/dL — AB (ref 65–99)
Potassium: 4.4 mmol/L (ref 3.5–5.1)
SODIUM: 136 mmol/L (ref 135–145)
Total Bilirubin: 0.5 mg/dL (ref 0.3–1.2)
Total Protein: 6.8 g/dL (ref 6.5–8.1)

## 2018-02-21 LAB — CBC WITH DIFFERENTIAL/PLATELET
BASOS ABS: 0 10*3/uL (ref 0.0–0.1)
Basophils Relative: 0 %
Eosinophils Absolute: 0.1 10*3/uL (ref 0.0–0.7)
Eosinophils Relative: 0 %
HCT: 39 % (ref 36.0–46.0)
HEMOGLOBIN: 12.8 g/dL (ref 12.0–15.0)
LYMPHS PCT: 37 %
Lymphs Abs: 6.7 10*3/uL (ref 0.7–4.0)
MCH: 28.4 pg (ref 26.0–34.0)
MCHC: 32.8 g/dL (ref 30.0–36.0)
MCV: 86.5 fL (ref 78.0–100.0)
MONO ABS: 0.6 10*3/uL (ref 0.1–1.0)
Monocytes Relative: 3 %
NEUTROS ABS: 10.6 10*3/uL (ref 1.7–7.7)
Neutrophils Relative %: 60 %
Platelets: 254 10*3/uL (ref 150–400)
RBC: 4.51 MIL/uL (ref 3.87–5.11)
RDW: 16.2 % — ABNORMAL HIGH (ref 11.5–15.5)
WBC: 17.9 10*3/uL — AB (ref 4.0–10.5)

## 2018-02-21 LAB — T4, FREE: Free T4: 1.41 ng/dL — ABNORMAL HIGH (ref 0.61–1.12)

## 2018-02-21 LAB — TROPONIN I: Troponin I: 1.58 ng/mL (ref <0.03)

## 2018-02-21 LAB — BRAIN NATRIURETIC PEPTIDE: B Natriuretic Peptide: 78.4 pg/mL (ref 0.0–100.0)

## 2018-02-21 LAB — TSH: TSH: 1.162 u[IU]/mL (ref 0.350–4.500)

## 2018-02-21 MED ORDER — IOPAMIDOL (ISOVUE-370) INJECTION 76%
100.0000 mL | Freq: Once | INTRAVENOUS | Status: AC | PRN
  Administered 2018-02-21: 80 mL via INTRAVENOUS

## 2018-02-21 MED ORDER — ASPIRIN 81 MG PO CHEW
324.0000 mg | CHEWABLE_TABLET | Freq: Once | ORAL | Status: AC
  Administered 2018-02-21: 324 mg via ORAL
  Filled 2018-02-21: qty 4

## 2018-02-21 MED ORDER — HEPARIN (PORCINE) IN NACL 100-0.45 UNIT/ML-% IJ SOLN
INTRAMUSCULAR | Status: AC
  Administered 2018-02-21: 4500 [IU] via INTRAVENOUS
  Filled 2018-02-21: qty 250

## 2018-02-21 MED ORDER — IPRATROPIUM-ALBUTEROL 0.5-2.5 (3) MG/3ML IN SOLN
3.0000 mL | Freq: Four times a day (QID) | RESPIRATORY_TRACT | Status: DC
  Administered 2018-02-21: 3 mL via RESPIRATORY_TRACT
  Filled 2018-02-21: qty 3

## 2018-02-21 MED ORDER — HEPARIN BOLUS VIA INFUSION
4500.0000 [IU] | Freq: Once | INTRAVENOUS | Status: AC
  Administered 2018-02-21: 4500 [IU] via INTRAVENOUS

## 2018-02-21 MED ORDER — HEPARIN (PORCINE) IN NACL 100-0.45 UNIT/ML-% IJ SOLN
1200.0000 [IU]/h | INTRAMUSCULAR | Status: DC
  Administered 2018-02-21 – 2018-02-22 (×2): 1200 [IU]/h via INTRAVENOUS
  Filled 2018-02-21: qty 250

## 2018-02-21 NOTE — Progress Notes (Signed)
ANTICOAGULATION CONSULT NOTE - Initial Consult  Pharmacy Consult for heparin Indication: pulmonary embolus  Allergies  Allergen Reactions  . Naproxen     Patient Measurements: Ideal body weight: 50.1 kg Actual body weight: 81.5 kg  Heparin Dosing Weight: 68 kg   Vital Signs: Temp: 99.2 F (37.3 C) (03/06 2008) Temp Source: Rectal (03/06 2008) BP: 113/65 (03/06 2150) Pulse Rate: 113 (03/06 2150)  Labs: Recent Labs    02/21/18 2020  HGB 12.8  HCT 39.0  PLT 254  CREATININE 1.26*  TROPONINI 1.58*    CrCl cannot be calculated (Unknown ideal weight.).   Medical History: Past Medical History:  Diagnosis Date  . Allergic rhinitis   . Anxiety   . Arthritis   . Asthma   . Chronic cough   . COPD (chronic obstructive pulmonary disease) ([place])   . Dupuytren contracture    RMF  . GERD (gastroesophageal reflux disease)   . Hypertension   . Hypothyroidism     Medications:   (Not in a hospital admission)  Assessment: 55 YOF here with SOB found to have bilateral PE with heart strain. H/H and Plt wnl. She is not on any anticoagulation prior to admission  Goal of Therapy:  Heparin level 0.3-0.7 units/ml Monitor platelets by anticoagulation protocol: Yes   Plan:  -Heparin 4500 units IV once, then start IV heparin at 1200 units/hr  -F/u 8 hr HL -Monitor daily HL, CBC and s/s of bleeding    Albertina Parr, PharmD., BCPS Clinical Pharmacist

## 2018-02-21 NOTE — ED Notes (Signed)
Attempted two IV sticks without success 

## 2018-02-21 NOTE — ED Notes (Signed)
Date and time results received: 02/21/18 2127   Test: Troponin Critical Value: 1.58  Name of Provider Notified: Dr. Billy Fischer  Orders Received? Or Actions Taken?: new orders received and initiated.

## 2018-02-21 NOTE — ED Notes (Signed)
Patient is tearful with this RN and will not talk to this RN " because she is so SOB"  - asking her family to talk for her.

## 2018-02-21 NOTE — ED Provider Notes (Signed)
[facility] EMERGENCY DEPARTMENT Provider Note   CSN: [id number] Arrival date & time: 02/21/18  1941     History   Chief Complaint Chief Complaint  Patient presents with  . Shortness of Breath    HPI [name] is a 72 y.o. female.  HPI   Has had some shortness of breath over the last 6 weeks with exertion, has seen doctor several times, diagnosed with sinus infection, colds, bronchitis.  Recently was on steroids which stopped on Saturday 5 days, had improved for a few days but today shortness of breath worsened again, now feeling severe weakness and shortness of breath today. Short of breath at rest now also.  Burning pain in the center of the chest with pressure into throat.  Feels like having trouble swallowing.  Burning pain also going into back.  No nausea or vomiting.  Coughing off and on, not really any increase today.  Went through several bouts of coughing earlier in the year but that had improved.  No abdominal pain.  Dizziness, lightheadedness. No leg swelling, no hemoptysis.  Sometimes dyspnea when laying flat.  Sleeps on a few pillows sometimes to breath, has been that way for several months.  Gave her azithromycin and steroid this AM at urgent care. Then worsened and came in.  Pulmonologist appointment tomorrow.  Had outpt surgery 1 mo ago-1hr dupuytren contracture release, no hospitalization. Breast reduction done in the fall.  No hx of DVT, no long trips, no known hx of Ca.   Past Medical History:  Diagnosis Date  . Allergic rhinitis   . Anxiety   . Arthritis   . Asthma   . Chronic cough   . COPD (chronic obstructive pulmonary disease) ([place])   . Dupuytren contracture    RMF  . GERD (gastroesophageal reflux disease)   . Hypertension   . Hypothyroidism     Patient Active Problem List   Diagnosis Date Noted  . Acute pulmonary embolism ([place]) 02/22/2018  . Cyst of right orbit 01/01/2016  . Left facial pain 11/03/2015  . Acute maxillary sinusitis  03/08/2015  . HYPOTHYROIDISM 01/23/2008  . Seasonal and perennial allergic rhinitis 01/23/2008  . Asthma, mild intermittent 01/23/2008  . Esophageal reflux 01/23/2008  . HYPERTENSION NEC 01/23/2008    Past Surgical History:  Procedure Laterality Date  . BLADDER TACK     X 2  . BUNIONECTOMY     RIGHT FOOT ARCH REPAIRED AS WELL  . DILATION AND CURETTAGE OF UTERUS     X 3  . FASCIECTOMY Right 01/23/2018   Procedure: SEGMENTAL FASCIECTOMY RIGHT MIDDLE FINGER;  Surgeon: Daryll Brod, MD;  Location: [facility];  Service: Orthopedics;  Laterality: Right;  AXILLARY BLOCK  . KNEE ARTHROSCOPY     RIGHT  . TOTAL ABDOMINAL HYSTERECTOMY      OB History    No data available       Home Medications    Prior to Admission medications   Medication Sig Start Date End Date Taking? Authorizing Provider  albuterol (PROAIR HFA) 108 (90 BASE) MCG/ACT inhaler Inhale 2 puffs into the lungs every 4 (four) hours as needed for wheezing or shortness of breath. 11/20/14   Deneise Lever, MD  albuterol (PROVENTIL HFA;VENTOLIN HFA) 108 (90 Base) MCG/ACT inhaler Inhale 1-2 puffs into the lungs every 6 (six) hours as needed for wheezing or shortness of breath. 02/13/18   Fredia Sorrow, MD  albuterol (PROVENTIL) (2.5 MG/3ML) 0.083% nebulizer solution Take 3 mLs (2.5  mg total) by nebulization every 6 (six) hours as needed for wheezing or shortness of breath. 11/26/14   Deneise Lever, MD  ALPRAZolam Duanne Moron) 0.25 MG tablet Take 1 tablet by mouth At bedtime. 09/29/11   [provider]  azelastine (ASTELIN) 0.1 % nasal spray Place into both nostrils 2 (two) times daily. Use in each nostril as directed    [provider]  budesonide (PULMICORT) 0.25 MG/2ML nebulizer solution Take 2 mLs (0.25 mg total) by nebulization 2 (two) times daily. 11/26/14   Baird Lyons D, MD  celecoxib (CELEBREX) 200 MG capsule Take 200 mg by mouth daily.      [provider]  Cholecalciferol  (VITAMIN D3) 50000 units TABS Take by mouth.    [provider]  Coenzyme Q10 (COQ10) 100 MG CAPS Take by mouth. Reported on 01/01/2016    [provider]  dextromethorphan-guaiFENesin (MUCINEX DM) 30-600 MG 12hr tablet Take 1 tablet by mouth 2 (two) times daily. 02/13/18   Fredia Sorrow, MD  fluticasone (FLONASE) 50 MCG/ACT nasal spray Place 2 sprays into both nostrils daily. 03/12/15   Baird Lyons D, MD  Fluticasone-Salmeterol (ADVAIR DISKUS) 250-50 MCG/DOSE AEPB 1 puff and rinse well, twice daily 03/12/15   Baird Lyons D, MD  HYDROcodone-acetaminophen (NORCO) 5-325 MG tablet Take 1 tablet by mouth every 6 (six) hours as needed. 01/23/18   Daryll Brod, MD  latanoprost (XALATAN) 0.005 % ophthalmic solution  08/18/15   [provider]  levocetirizine (XYZAL) 5 MG tablet Take 5 mg by mouth every evening.    [provider]  levothyroxine (SYNTHROID, LEVOTHROID) 50 MCG tablet Take 1 tablet by mouth daily. 09/02/13   [provider]  losartan-hydrochlorothiazide (HYZAAR) 100-12.5 MG per tablet Take 1 tablet by mouth daily.      [provider]  Multiple Vitamin (MULTIVITAMIN) tablet Take 1 tablet by mouth daily.    [provider]  Omega-3 Fatty Acids (SUPER OMEGA 3 EPA/DHA) 1000 MG CAPS Take by mouth.    [provider]  pantoprazole (PROTONIX) 40 MG tablet TK 1 T PO QD 08/29/15   [provider]  pregabalin (LYRICA) 75 MG capsule Take 75 mg by mouth 2 (two) times daily.    [provider]  ranitidine (ZANTAC) 300 MG tablet  11/03/14   [provider]  rOPINIRole (REQUIP) 2 MG tablet  11/10/14   [provider]  venlafaxine XR (EFFEXOR-XR) 75 MG 24 hr capsule  10/07/14   [provider]    Family History Family History  Problem Relation Age of Onset  . Pneumonia Father   . Arthritis Mother     Social History Social History   Tobacco Use  . Smoking status: Never Smoker  .  Smokeless tobacco: Never Used  Substance Use Topics  . Alcohol use: Yes    Alcohol/week: 0.0 oz    Comment: social  . Drug use: No     Allergies   Naproxen   Review of Systems Review of Systems  Constitutional: Positive for fatigue. Negative for fever.  HENT: Negative for sore throat.   Eyes: Negative for visual disturbance.  Respiratory: Positive for shortness of breath. Negative for cough.   Cardiovascular: Positive for chest pain. Negative for leg swelling.  Gastrointestinal: Negative for abdominal pain, nausea and vomiting.  Genitourinary: Negative for difficulty urinating and dysuria.  Musculoskeletal: Negative for back pain and neck pain.  Skin: Negative for rash.  Neurological: Negative for syncope and headaches.  Physical Exam Updated Vital Signs BP 105/70   Pulse (!) 106   Temp 99.2 F (37.3 C) (Rectal)   Resp (!) 31   SpO2 96%   Physical Exam  Constitutional: She is oriented to person, place, and time. She appears well-developed and well-nourished. No distress.  HENT:  Head: Normocephalic and atraumatic.  Eyes: Conjunctivae and EOM are normal.  Neck: Normal range of motion.  Cardiovascular: Regular rhythm, normal heart sounds and intact distal pulses. Tachycardia present. Exam reveals no gallop and no friction rub.  No murmur heard. Pulmonary/Chest: Effort normal and breath sounds normal. Tachypnea noted. No respiratory distress. She has no wheezes. She has no rales.  Abdominal: Soft. She exhibits no distension. There is no tenderness. There is no guarding.  Musculoskeletal: She exhibits no edema or tenderness.  Neurological: She is alert and oriented to person, place, and time.  Skin: Skin is warm and dry. No rash noted. She is not diaphoretic. No erythema.  Nursing note and vitals reviewed.    ED Treatments / Results  Labs (all labs ordered are listed, but only abnormal results are displayed) Labs Reviewed  CBC WITH DIFFERENTIAL/PLATELET -  Abnormal; Notable for the following components:      Result Value   WBC 17.9 (*)    RDW 16.2 (*)    All other components within normal limits  COMPREHENSIVE METABOLIC PANEL - Abnormal; Notable for the following components:   CO2 18 (*)    Glucose, Bld 313 (*)    BUN 23 (*)    Creatinine, Ser 1.26 (*)    GFR calc non Af Amer 42 (*)    GFR calc Af Amer 49 (*)    All other components within normal limits  TROPONIN I - Abnormal; Notable for the following components:   Troponin I 1.58 (*)    All other components within normal limits  T4, FREE - Abnormal; Notable for the following components:   Free T4 1.41 (*)    All other components within normal limits  BRAIN NATRIURETIC PEPTIDE  TSH  PATHOLOGIST SMEAR REVIEW  HEPARIN LEVEL (UNFRACTIONATED)  HEPARIN LEVEL (UNFRACTIONATED)  CBC    EKG  EKG Interpretation None       Radiology Dg Chest 2 View  Result Date: 02/21/2018 CLINICAL DATA:  Chest pain and shortness of breath.  Cough. EXAM: CHEST - 2 VIEW COMPARISON:  02/13/2018 and 11/20/2014 FINDINGS: The heart size and mediastinal contours are within normal limits. Both lungs are clear. Arthritic changes at the left shoulder. IMPRESSION: No active cardiopulmonary disease. Electronically Signed   By: Lorriane Shire M.D.   On: 02/21/2018 20:44   Ct Angio Chest Pe W And/or Wo Contrast  Result Date: 02/21/2018 CLINICAL DATA:  Increased short of breath and chest pain EXAM: CT ANGIOGRAPHY CHEST WITH CONTRAST TECHNIQUE: Multidetector CT imaging of the chest was performed using the standard protocol during bolus administration of intravenous contrast. Multiplanar CT image reconstructions and MIPs were obtained to evaluate the vascular anatomy. CONTRAST:  79 mL ISOVUE-370 IOPAMIDOL (ISOVUE-370) INJECTION 76% COMPARISON:  Chest x-ray 02/21/2018 FINDINGS: Cardiovascular: Satisfactory opacification of the pulmonary arteries to the segmental level. Thrombus present within the distal right and left  pulmonary arteries with thrombus extending into the inter lobar arteries an segmental and subsegmental arteries of the upper, lower, and right middle lobes. RV LV ratio is positive at 1.8. Reflux of contrast into the hepatic veins also consistent with right heart strain. Heart size within normal limits. No pericardial effusion. Mild  aortic atherosclerosis. Coronary artery calcification. Mediastinum/Nodes: No enlarged mediastinal, hilar, or axillary lymph nodes. Thyroid gland, trachea, and esophagus demonstrate no significant findings. Lungs/Pleura: Lungs are clear. No pleural effusion or pneumothorax. Upper Abdomen: Cyst in the left hepatic lobe. No acute abnormality in the upper abdomen Musculoskeletal: No chest wall abnormality. No acute or significant osseous findings. Review of the MIP images confirms the above findings. IMPRESSION: 1. Findings consistent with acute bilateral pulmonary emboli involving the distal right and left main pulmonary arteries with extension of thrombus into the inter lobar arteries and segmental and subsegmental branches of all lobes of the lung. Positive for acute PE with CT evidence of right heart strain (RV/LV Ratio = 1.8) consistent with at least submassive (intermediate risk) PE. The presence of right heart strain has been associated with an increased risk of morbidity and mortality. Please activate Code PE by paging [phone]. 2. Clear lung fields Critical Value/emergent results were called by telephone at the time of interpretation on 02/21/2018 at 9:52 pm to Dr. Gareth Morgan , who verbally acknowledged these results. Aortic Atherosclerosis (ICD10-I70.0). Electronically Signed   By: Donavan Foil M.D.   On: 02/21/2018 21:53    Procedures .Critical Care Performed by: Gareth Morgan, MD Authorized by: Gareth Morgan, MD   Critical care provider statement:    Critical care time (minutes):  40   Critical care was necessary to treat or prevent imminent or  life-threatening deterioration of the following conditions:  Respiratory failure   Critical care was time spent personally by me on the following activities:  Ordering and review of laboratory studies, re-evaluation of patient's condition, pulse oximetry, ordering and review of radiographic studies, examination of patient, evaluation of patient's response to treatment, discussions with consultants and review of old charts   (including critical care time)  Medications Ordered in ED Medications  heparin ADULT infusion 100 units/mL (25000 units/258mL sodium chloride 0.45%) (not administered)  budesonide (PULMICORT) nebulizer solution 0.25 mg (not administered)  azelastine (ASTELIN) 0.1 % nasal spray 2 spray (not administered)  fluticasone (FLONASE) 50 MCG/ACT nasal spray 2 spray (not administered)  levothyroxine (SYNTHROID, LEVOTHROID) tablet 50 mcg (not administered)  rOPINIRole (REQUIP) tablet 2 mg (not administered)  iopamidol (ISOVUE-370) 76 % injection 100 mL (80 mLs Intravenous Contrast Given 02/21/18 2122)  aspirin chewable tablet 324 mg (324 mg Oral Given 02/21/18 2149)  heparin bolus via infusion 4,500 Units (4,500 Units Intravenous Bolus from Bag 02/21/18 2221)  LORazepam (ATIVAN) tablet 0.5 mg (0.5 mg Oral Given 02/22/18 0046)     Initial Impression / Assessment and Plan / ED Course  I have reviewed the triage vital signs and the nursing notes.  Pertinent labs & imaging results that were available during my care of the patient were reviewed by me and considered in my medical decision making (see chart for details).    72 year old female with a history of hypothyroidism, hypertension, asthma, GERD, who presents with concern for shortness of breath.  Reports some dyspnea beginning 6 weeks ago, however has had significant worsening more acutely today.  Patient is tachycardic into the 120s, and hypoxic to 89% on arrival to the emergency department.  She has no wheezing on exam, chest x-ray  shows no signs of pneumonia, pneumothorax or congestive heart failure. EKG evaluated by me but unable to upload into MUSE.  Shows sinus tachycardia without other acute ST changes.  CT PE study was completed and evaluated by me and radiology which shows evidence of multiple bilateral pulmonary emboli, with signs  of right heart strain. Heparin gtt initiated. Her troponin is elevated to 1.58. BNP WNL. Discussed with Dr. Lucile Shutters, recommends stepdown admission and ECHO, continuing heparin gtt.  Heart rate improved to 90s-100s, O2 stable on 2L, no respiratory distress, blood pressures stable.  Awaiting stepdown admission, discussed with Dr. Alcario Drought. Updated family regarding care.     Final Clinical Impressions(s) / ED Diagnoses   Final diagnoses:  Other acute pulmonary embolism without acute cor pulmonale [facility])    ED Discharge Orders    None       Gareth Morgan, MD 02/22/18 [phone]

## 2018-02-21 NOTE — ED Notes (Signed)
Patient transported to CT 

## 2018-02-21 NOTE — ED Triage Notes (Signed)
Pt was seen last week at [facility] ED-dx with bronchitis-today c/o increase in SOB, CP-was seen at [place] steroid inj, zpack-"trreating a infection"-pt presents to triage in w/c-anxious with daughter

## 2018-02-22 ENCOUNTER — Inpatient Hospital Stay: Payer: Self-pay | Admitting: Acute Care

## 2018-02-22 ENCOUNTER — Inpatient Hospital Stay (HOSPITAL_COMMUNITY): Payer: Medicare Other

## 2018-02-22 ENCOUNTER — Encounter (HOSPITAL_COMMUNITY): Payer: Self-pay | Admitting: Internal Medicine

## 2018-02-22 DIAGNOSIS — R0602 Shortness of breath: Secondary | ICD-10-CM | POA: Diagnosis present

## 2018-02-22 DIAGNOSIS — R778 Other specified abnormalities of plasma proteins: Secondary | ICD-10-CM

## 2018-02-22 DIAGNOSIS — Z7901 Long term (current) use of anticoagulants: Secondary | ICD-10-CM | POA: Diagnosis not present

## 2018-02-22 DIAGNOSIS — D72829 Elevated white blood cell count, unspecified: Secondary | ICD-10-CM

## 2018-02-22 DIAGNOSIS — I10 Essential (primary) hypertension: Secondary | ICD-10-CM | POA: Diagnosis not present

## 2018-02-22 DIAGNOSIS — I2609 Other pulmonary embolism with acute cor pulmonale: Secondary | ICD-10-CM | POA: Diagnosis not present

## 2018-02-22 DIAGNOSIS — E119 Type 2 diabetes mellitus without complications: Secondary | ICD-10-CM | POA: Diagnosis not present

## 2018-02-22 DIAGNOSIS — Z7951 Long term (current) use of inhaled steroids: Secondary | ICD-10-CM | POA: Diagnosis not present

## 2018-02-22 DIAGNOSIS — K219 Gastro-esophageal reflux disease without esophagitis: Secondary | ICD-10-CM | POA: Diagnosis present

## 2018-02-22 DIAGNOSIS — E1122 Type 2 diabetes mellitus with diabetic chronic kidney disease: Secondary | ICD-10-CM | POA: Diagnosis present

## 2018-02-22 DIAGNOSIS — I361 Nonrheumatic tricuspid (valve) insufficiency: Secondary | ICD-10-CM

## 2018-02-22 DIAGNOSIS — E039 Hypothyroidism, unspecified: Secondary | ICD-10-CM | POA: Diagnosis present

## 2018-02-22 DIAGNOSIS — Z886 Allergy status to analgesic agent status: Secondary | ICD-10-CM | POA: Diagnosis not present

## 2018-02-22 DIAGNOSIS — R0982 Postnasal drip: Secondary | ICD-10-CM | POA: Diagnosis present

## 2018-02-22 DIAGNOSIS — N183 Chronic kidney disease, stage 3 (moderate): Secondary | ICD-10-CM | POA: Diagnosis present

## 2018-02-22 DIAGNOSIS — F419 Anxiety disorder, unspecified: Secondary | ICD-10-CM | POA: Diagnosis present

## 2018-02-22 DIAGNOSIS — I2699 Other pulmonary embolism without acute cor pulmonale: Secondary | ICD-10-CM | POA: Diagnosis present

## 2018-02-22 DIAGNOSIS — R748 Abnormal levels of other serum enzymes: Secondary | ICD-10-CM | POA: Diagnosis not present

## 2018-02-22 DIAGNOSIS — R7989 Other specified abnormal findings of blood chemistry: Secondary | ICD-10-CM

## 2018-02-22 DIAGNOSIS — J3089 Other allergic rhinitis: Secondary | ICD-10-CM | POA: Diagnosis not present

## 2018-02-22 DIAGNOSIS — Z79890 Hormone replacement therapy (postmenopausal): Secondary | ICD-10-CM | POA: Diagnosis not present

## 2018-02-22 DIAGNOSIS — J4530 Mild persistent asthma, uncomplicated: Secondary | ICD-10-CM | POA: Diagnosis present

## 2018-02-22 DIAGNOSIS — J4520 Mild intermittent asthma, uncomplicated: Secondary | ICD-10-CM | POA: Diagnosis not present

## 2018-02-22 DIAGNOSIS — R0902 Hypoxemia: Secondary | ICD-10-CM | POA: Diagnosis present

## 2018-02-22 DIAGNOSIS — J302 Other seasonal allergic rhinitis: Secondary | ICD-10-CM | POA: Diagnosis not present

## 2018-02-22 DIAGNOSIS — I129 Hypertensive chronic kidney disease with stage 1 through stage 4 chronic kidney disease, or unspecified chronic kidney disease: Secondary | ICD-10-CM | POA: Diagnosis present

## 2018-02-22 DIAGNOSIS — Z5181 Encounter for therapeutic drug level monitoring: Secondary | ICD-10-CM | POA: Diagnosis not present

## 2018-02-22 LAB — TROPONIN I
TROPONIN I: 2.16 ng/mL — AB (ref <0.03)
Troponin I: 2.06 ng/mL (ref <0.03)

## 2018-02-22 LAB — MRSA PCR SCREENING: MRSA BY PCR: NEGATIVE

## 2018-02-22 LAB — CBC
HCT: 35.1 % — ABNORMAL LOW (ref 36.0–46.0)
HEMOGLOBIN: 11.6 g/dL — AB (ref 12.0–15.0)
MCH: 28.6 pg (ref 26.0–34.0)
MCHC: 33 g/dL (ref 30.0–36.0)
MCV: 86.5 fL (ref 78.0–100.0)
Platelets: 237 10*3/uL (ref 150–400)
RBC: 4.06 MIL/uL (ref 3.87–5.11)
RDW: 16.2 % — ABNORMAL HIGH (ref 11.5–15.5)
WBC: 13.7 10*3/uL — ABNORMAL HIGH (ref 4.0–10.5)

## 2018-02-22 LAB — HEMOGLOBIN A1C
HEMOGLOBIN A1C: 6.9 % — AB (ref 4.8–5.6)
Mean Plasma Glucose: 151.33 mg/dL

## 2018-02-22 LAB — HEPARIN LEVEL (UNFRACTIONATED)
HEPARIN UNFRACTIONATED: 1.58 [IU]/mL — AB (ref 0.30–0.70)
Heparin Unfractionated: 1.26 IU/mL — ABNORMAL HIGH (ref 0.30–0.70)

## 2018-02-22 LAB — PATHOLOGIST SMEAR REVIEW: PATH REVIEW: REACTIVE

## 2018-02-22 LAB — ECHOCARDIOGRAM COMPLETE

## 2018-02-22 LAB — GLUCOSE, CAPILLARY
Glucose-Capillary: 156 mg/dL — ABNORMAL HIGH (ref 65–99)
Glucose-Capillary: 138 mg/dL — ABNORMAL HIGH (ref 65–99)

## 2018-02-22 MED ORDER — PREGABALIN 50 MG PO CAPS
75.0000 mg | ORAL_CAPSULE | Freq: Two times a day (BID) | ORAL | Status: DC
  Administered 2018-02-22 – 2018-02-24 (×5): 75 mg via ORAL
  Filled 2018-02-22 (×5): qty 1

## 2018-02-22 MED ORDER — ALBUTEROL SULFATE HFA 108 (90 BASE) MCG/ACT IN AERS: 2.0000 | INHALATION_SPRAY | RESPIRATORY_TRACT | Status: DC | PRN

## 2018-02-22 MED ORDER — FAMOTIDINE 20 MG PO TABS
20.0000 mg | ORAL_TABLET | Freq: Every day | ORAL | Status: DC
  Administered 2018-02-22 – 2018-02-24 (×3): 20 mg via ORAL
  Filled 2018-02-22 (×3): qty 1

## 2018-02-22 MED ORDER — FLUTICASONE PROPIONATE 50 MCG/ACT NA SUSP
2.0000 | Freq: Every day | NASAL | Status: DC
  Administered 2018-02-22 – 2018-02-24 (×3): 2 via NASAL
  Filled 2018-02-22 (×2): qty 16

## 2018-02-22 MED ORDER — LEVOTHYROXINE SODIUM 50 MCG PO TABS
50.0000 ug | ORAL_TABLET | Freq: Every day | ORAL | Status: DC
  Filled 2018-02-22: qty 1

## 2018-02-22 MED ORDER — LOSARTAN POTASSIUM 50 MG PO TABS
100.0000 mg | ORAL_TABLET | Freq: Every day | ORAL | Status: DC
  Administered 2018-02-22: 100 mg via ORAL
  Filled 2018-02-22: qty 2

## 2018-02-22 MED ORDER — LORATADINE 10 MG PO TABS
10.0000 mg | ORAL_TABLET | Freq: Every day | ORAL | Status: DC
  Administered 2018-02-22 – 2018-02-24 (×3): 10 mg via ORAL
  Filled 2018-02-22 (×3): qty 1

## 2018-02-22 MED ORDER — ADULT MULTIVITAMIN W/MINERALS CH
1.0000 | ORAL_TABLET | Freq: Every day | ORAL | Status: DC
  Administered 2018-02-22 – 2018-02-24 (×3): 1 via ORAL
  Filled 2018-02-22 (×3): qty 1

## 2018-02-22 MED ORDER — PANTOPRAZOLE SODIUM 40 MG PO TBEC
40.0000 mg | DELAYED_RELEASE_TABLET | Freq: Every day | ORAL | Status: DC
  Administered 2018-02-22 – 2018-02-24 (×3): 40 mg via ORAL
  Filled 2018-02-22 (×3): qty 1

## 2018-02-22 MED ORDER — LOSARTAN POTASSIUM-HCTZ 100-12.5 MG PO TABS: 1.0000 | ORAL_TABLET | Freq: Every day | ORAL | Status: DC

## 2018-02-22 MED ORDER — HYDROCHLOROTHIAZIDE 12.5 MG PO CAPS
12.5000 mg | ORAL_CAPSULE | Freq: Every day | ORAL | Status: DC
  Administered 2018-02-22: 12.5 mg via ORAL
  Filled 2018-02-22: qty 1

## 2018-02-22 MED ORDER — VENLAFAXINE HCL ER 150 MG PO CP24
150.0000 mg | ORAL_CAPSULE | Freq: Every day | ORAL | Status: DC
  Administered 2018-02-22 – 2018-02-24 (×3): 150 mg via ORAL
  Filled 2018-02-22 (×4): qty 1

## 2018-02-22 MED ORDER — ACETAMINOPHEN 650 MG RE SUPP: 650.0000 mg | Freq: Four times a day (QID) | RECTAL | Status: DC | PRN

## 2018-02-22 MED ORDER — LORAZEPAM 1 MG PO TABS
0.5000 mg | ORAL_TABLET | Freq: Once | ORAL | Status: AC
  Administered 2018-02-22: 0.5 mg via ORAL
  Filled 2018-02-22: qty 1

## 2018-02-22 MED ORDER — ALPRAZOLAM 0.25 MG PO TABS
0.2500 mg | ORAL_TABLET | Freq: Every evening | ORAL | Status: DC | PRN
  Filled 2018-02-22: qty 1

## 2018-02-22 MED ORDER — POTASSIUM CHLORIDE IN NACL 20-0.9 MEQ/L-% IV SOLN
INTRAVENOUS | Status: DC
  Administered 2018-02-22 – 2018-02-23 (×2): via INTRAVENOUS
  Filled 2018-02-22 (×3): qty 1000

## 2018-02-22 MED ORDER — BUDESONIDE 0.25 MG/2ML IN SUSP
0.2500 mg | Freq: Two times a day (BID) | RESPIRATORY_TRACT | Status: DC
  Administered 2018-02-22: 0.25 mg via RESPIRATORY_TRACT
  Filled 2018-02-22: qty 2

## 2018-02-22 MED ORDER — LATANOPROST 0.005 % OP SOLN
1.0000 [drp] | Freq: Every day | OPHTHALMIC | Status: DC
  Administered 2018-02-22 – 2018-02-23 (×2): 1 [drp] via OPHTHALMIC
  Filled 2018-02-22: qty 2.5

## 2018-02-22 MED ORDER — INSULIN ASPART 100 UNIT/ML ~~LOC~~ SOLN
0.0000 [IU] | Freq: Three times a day (TID) | SUBCUTANEOUS | Status: DC
  Administered 2018-02-22 – 2018-02-23 (×2): 3 [IU] via SUBCUTANEOUS

## 2018-02-22 MED ORDER — ONDANSETRON HCL 4 MG PO TABS: 4.0000 mg | ORAL_TABLET | Freq: Four times a day (QID) | ORAL | Status: DC | PRN

## 2018-02-22 MED ORDER — OXYCODONE HCL 5 MG PO TABS
5.0000 mg | ORAL_TABLET | ORAL | Status: DC | PRN
  Administered 2018-02-22: 5 mg via ORAL
  Filled 2018-02-22: qty 1

## 2018-02-22 MED ORDER — BUDESONIDE 0.25 MG/2ML IN SUSP
0.2500 mg | Freq: Two times a day (BID) | RESPIRATORY_TRACT | Status: DC
  Filled 2018-02-22: qty 2

## 2018-02-22 MED ORDER — DM-GUAIFENESIN ER 30-600 MG PO TB12
1.0000 | ORAL_TABLET | Freq: Two times a day (BID) | ORAL | Status: DC
  Administered 2018-02-22 – 2018-02-24 (×5): 1 via ORAL
  Filled 2018-02-22 (×5): qty 1

## 2018-02-22 MED ORDER — HEPARIN (PORCINE) IN NACL 100-0.45 UNIT/ML-% IJ SOLN
750.0000 [IU]/h | INTRAMUSCULAR | Status: DC
  Administered 2018-02-23: 750 [IU]/h via INTRAVENOUS
  Filled 2018-02-22: qty 250

## 2018-02-22 MED ORDER — POLYETHYLENE GLYCOL 3350 17 G PO PACK: 17.0000 g | PACK | Freq: Every day | ORAL | Status: DC | PRN

## 2018-02-22 MED ORDER — LEVOTHYROXINE SODIUM 50 MCG PO TABS
50.0000 ug | ORAL_TABLET | Freq: Every day | ORAL | Status: DC
  Administered 2018-02-22 – 2018-02-24 (×3): 50 ug via ORAL
  Filled 2018-02-22 (×2): qty 1

## 2018-02-22 MED ORDER — AZELASTINE HCL 0.1 % NA SOLN
2.0000 | Freq: Two times a day (BID) | NASAL | Status: DC
  Administered 2018-02-22 – 2018-02-24 (×3): 2 via NASAL
  Filled 2018-02-22 (×2): qty 30

## 2018-02-22 MED ORDER — SODIUM CHLORIDE 0.9% FLUSH
3.0000 mL | Freq: Two times a day (BID) | INTRAVENOUS | Status: DC
  Administered 2018-02-24: 3 mL via INTRAVENOUS

## 2018-02-22 MED ORDER — ALBUTEROL SULFATE (2.5 MG/3ML) 0.083% IN NEBU: 2.5000 mg | INHALATION_SOLUTION | Freq: Four times a day (QID) | RESPIRATORY_TRACT | Status: DC | PRN

## 2018-02-22 MED ORDER — INSULIN ASPART 100 UNIT/ML ~~LOC~~ SOLN: 0.0000 [IU] | Freq: Every day | SUBCUTANEOUS | Status: DC

## 2018-02-22 MED ORDER — MOMETASONE FURO-FORMOTEROL FUM 200-5 MCG/ACT IN AERO
2.0000 | INHALATION_SPRAY | Freq: Two times a day (BID) | RESPIRATORY_TRACT | Status: DC
  Administered 2018-02-22 – 2018-02-24 (×4): 2 via RESPIRATORY_TRACT
  Filled 2018-02-22: qty 8.8

## 2018-02-22 MED ORDER — LEVOCETIRIZINE DIHYDROCHLORIDE 5 MG PO TABS: 5.0000 mg | ORAL_TABLET | Freq: Every evening | ORAL | Status: DC

## 2018-02-22 MED ORDER — HEPARIN (PORCINE) IN NACL 100-0.45 UNIT/ML-% IJ SOLN: 1000.0000 [IU]/h | INTRAMUSCULAR | Status: DC

## 2018-02-22 MED ORDER — ONDANSETRON HCL 4 MG/2ML IJ SOLN: 4.0000 mg | Freq: Four times a day (QID) | INTRAMUSCULAR | Status: DC | PRN

## 2018-02-22 MED ORDER — ROPINIROLE HCL 1 MG PO TABS
2.0000 mg | ORAL_TABLET | Freq: Two times a day (BID) | ORAL | Status: DC
  Administered 2018-02-22 – 2018-02-24 (×3): 2 mg via ORAL
  Filled 2018-02-22 (×6): qty 2

## 2018-02-22 MED ORDER — ACETAMINOPHEN 325 MG PO TABS: 650.0000 mg | ORAL_TABLET | Freq: Four times a day (QID) | ORAL | Status: DC | PRN

## 2018-02-22 NOTE — Progress Notes (Signed)
  Echocardiogram 2D Echocardiogram has been performed.  [name] 02/22/2018, 12:25 PM

## 2018-02-22 NOTE — Progress Notes (Signed)
[facility] for heparin Indication: pulmonary embolus  Allergies  Allergen Reactions  . Naproxen     Patient Measurements: Ideal body weight: 50.1 kg Actual body weight: 81.5 kg  Heparin Dosing Weight: 68 kg   Vital Signs: Temp: 97.8 F (36.6 C) (03/07 2038) Temp Source: Oral (03/07 2038) BP: 122/71 (03/07 2038) Pulse Rate: 103 (03/07 2134)  Labs: Recent Labs    02/21/18 2020 02/22/18 0953 02/22/18 1235 02/22/18 1853 02/22/18 2057  HGB 12.8 11.6*  --   --   --   HCT 39.0 35.1*  --   --   --   PLT 254 237  --   --   --   HEPARINUNFRC  --  1.58*  --   --  1.26*  CREATININE 1.26*  --   --   --   --   TROPONINI 1.58*  --  2.06* 2.16*  --     Estimated Creatinine Clearance: 40.9 mL/min (A) (by C-G formula based on SCr of 1.26 mg/dL (H)).   Medical History: Past Medical History:  Diagnosis Date  . Allergic rhinitis   . Allergic rhinitis   . Anxiety   . Arthritis    "hands, feet, shoulders, arms" (02/22/2018)  . Bilateral pulmonary embolism ([place]) 02/21/2018  . Chronic cough   . Depression   . Dupuytren contracture    "both feet, both hands" (02/22/2018)  . GERD (gastroesophageal reflux disease)   . Hypertension   . Hypothyroidism   . Mild persistent asthma     Medications:  Medications Prior to Admission  Medication Sig Dispense Refill Last Dose  . albuterol (PROAIR HFA) 108 (90 BASE) MCG/ACT inhaler Inhale 2 puffs into the lungs every 4 (four) hours as needed for wheezing or shortness of breath. 2 Inhaler prn 02/21/2018 at Unknown time  . albuterol (PROVENTIL) (2.5 MG/3ML) 0.083% nebulizer solution Take 3 mLs (2.5 mg total) by nebulization every 6 (six) hours as needed for wheezing or shortness of breath. 1080 mL 3 02/21/2018 at Unknown time  . ALPRAZolam (XANAX) 0.25 MG tablet Take 1 tablet by mouth at bedtime as needed for anxiety.    02/21/2018 at Unknown time  . azelastine (ASTELIN) 0.1 % nasal spray Place into both nostrils 2  (two) times daily. Use in each nostril as directed   02/21/2018 at Unknown time  . celecoxib (CELEBREX) 200 MG capsule Take 200 mg by mouth daily.     02/21/2018 at Unknown time  . desvenlafaxine (PRISTIQ) 100 MG 24 hr tablet Take 100 mg by mouth daily.   02/20/2018  . dextromethorphan-guaiFENesin (MUCINEX DM) 30-600 MG 12hr tablet Take 1 tablet by mouth 2 (two) times daily. 14 tablet 1 02/21/2018 at Unknown time  . fluticasone (FLONASE) 50 MCG/ACT nasal spray Place 2 sprays into both nostrils daily. 16 g 3 02/21/2018 at Unknown time  . Fluticasone-Salmeterol (ADVAIR DISKUS) 250-50 MCG/DOSE AEPB 1 puff and rinse well, twice daily 60 each 3 02/21/2018 at Unknown time  . latanoprost (XALATAN) 0.005 % ophthalmic solution Place 1 drop into both eyes at bedtime.   2 02/20/2018  . levocetirizine (XYZAL) 5 MG tablet Take 5 mg by mouth every evening.   02/20/2018  . levothyroxine (SYNTHROID, LEVOTHROID) 50 MCG tablet Take 1 tablet by mouth daily.   02/21/2018 at Unknown time  . losartan-hydrochlorothiazide (HYZAAR) 100-12.5 MG per tablet Take 1 tablet by mouth daily.     02/21/2018 at Unknown time  . Multiple Vitamin (MULTIVITAMIN) tablet Take 1  tablet by mouth daily.   02/21/2018 at Unknown time  . pantoprazole (PROTONIX) 40 MG tablet TK 1 T PO QD  0 02/21/2018 at Unknown time  . pregabalin (LYRICA) 75 MG capsule Take 75 mg by mouth 2 (two) times daily.   02/20/2018  . ranitidine (ZANTAC) 300 MG tablet    02/20/2018  . rOPINIRole (REQUIP) 2 MG tablet   1 02/20/2018  . Vitamin D, Ergocalciferol, (DRISDOL) 50000 units CAPS capsule Take 50,000 Units by mouth once a week.   02/18/2018  . albuterol (PROVENTIL HFA;VENTOLIN HFA) 108 (90 Base) MCG/ACT inhaler Inhale 1-2 puffs into the lungs every 6 (six) hours as needed for wheezing or shortness of breath. (Patient not taking: Reported on 02/22/2018) 1 Inhaler 1 Not Taking at Unknown time  . budesonide (PULMICORT) 0.25 MG/2ML nebulizer solution Take 2 mLs (0.25 mg total) by nebulization 2 (two)  times daily. (Patient not taking: Reported on 02/22/2018) 180 mL 3 Not Taking at Unknown time  . HYDROcodone-acetaminophen (NORCO) 5-325 MG tablet Take 1 tablet by mouth every 6 (six) hours as needed. (Patient not taking: Reported on 02/22/2018) 20 tablet 0 Not Taking at Unknown time    Assessment: 3 YOF here with SOB found to have bilateral PE with RHS. Hg trend down to 11.6, Plt wnl. She is not on any anticoagulation prior to admission. 2nd heparin level = 1.26 after a 1 hour hold and decrease in heparin rate to 1000 units/hr.  RN reports heparin infusing at correct rate and no issues with IV.  No bleeding reported.  RN confirmed heparin level was drawn correctly from arm opposite of heparin infusion arm.   Goal of Therapy:  Heparin level 0.3-0.7 units/ml Monitor platelets by anticoagulation protocol: Yes   Plan:  -Hold heparin x 1 hour then resume heparin at lower rate of 750 units/hr at - communicated plan with RN  -6h heparin level from resumption -Monitor daily heparin level, CBC, and s/sx of bleeding  -F/u plans for long-term anticoagulation   Nicole Cella, RPh Clinical Pharmacist Pager: [phone] Clinical phone for 02/22/2018 until 3:30pm: [phone] If after 3:30pm, please call main pharmacy at: [phone] 02/22/2018 10:37 PM

## 2018-02-22 NOTE — Progress Notes (Addendum)
CRITICAL VALUE ALERT  Critical Value:  Troponin= 2.06  Date & Time Notied:  02/22/2018 at 1340  Provider Notified: Dr Evangeline Gula (via Shea Evans)  Orders Received/Actions taken: awaiting call back per request       Update:  Per Dr Evangeline Gula awaiting pulmonary consult

## 2018-02-22 NOTE — Care Management Note (Signed)
Case Management Note  Patient Details  Name: [name] MRN: [id number] Date of Birth: June 26, 1946  Subjective/Objective:   Pt presented for SOB -found to have Bilateral PE with heart Strain. Benefits Check completed for Xarelto and Eliquis. Unsure of anticoagulation at this time. CM will continue to monitor for additional needs.                 Action/Plan: S/W [facility] @ [employer] CHOICE RX # [phone] OPT- E    1. ELIQUIS 2.5 MF BID  COVER- YES  CO-PAY- $ 45.00  TIER- 3 DRUG  PRIOR APPROVAL- NO    2. ELIQUIS  5 MG BID  COVER- YES  CO-PAY- $ 45.00  TIER- 3 DRUG  PRIOR APPROVAL- NO    3. XARELTO 20 MG DAILY  COVER- YES  CO-PAY- $ 45.00  TIER- 3 DRUG  PRIOR APPROVAL- NO   PREFERRED PHARMACY : [facility], [facility], AND [facility]  Expected Discharge Date:                  Expected Discharge Plan:  Home/Self Care  In-House Referral:  NA  Discharge planning Services  CM Consult, Medication Assistance  Post Acute Care Choice:    Choice offered to:     DME Arranged:    DME Agency:     HH Arranged:    HH Agency:     Status of Service:  In process, will continue to follow  If discussed at Long Length of Stay Meetings, dates discussed:    Additional Comments:  Bethena Roys, RN 02/22/2018, 1:06 PM

## 2018-02-22 NOTE — Progress Notes (Signed)
[place] for heparin Indication: pulmonary embolus  Allergies  Allergen Reactions  . Naproxen     Patient Measurements: Ideal body weight: 50.1 kg Actual body weight: 81.5 kg  Heparin Dosing Weight: 68 kg   Vital Signs: BP: 134/77 (03/07 0808) Pulse Rate: 96 (03/07 0808)  Labs: Recent Labs    02/21/18 2020 02/22/18 0953  HGB 12.8 11.6*  HCT 39.0 35.1*  PLT 254 237  HEPARINUNFRC  --  1.58*  CREATININE 1.26*  --   TROPONINI 1.58*  --     CrCl cannot be calculated (Unknown ideal weight.).   Medical History: Past Medical History:  Diagnosis Date  . Allergic rhinitis   . Allergic rhinitis   . Anxiety   . Arthritis   . Chronic cough   . Dupuytren contracture    RMF  . GERD (gastroesophageal reflux disease)   . Hypertension   . Hypothyroidism   . Mild persistent asthma     Medications:  Medications Prior to Admission  Medication Sig Dispense Refill Last Dose  . albuterol (PROAIR HFA) 108 (90 BASE) MCG/ACT inhaler Inhale 2 puffs into the lungs every 4 (four) hours as needed for wheezing or shortness of breath. 2 Inhaler prn 02/21/2018 at Unknown time  . albuterol (PROVENTIL) (2.5 MG/3ML) 0.083% nebulizer solution Take 3 mLs (2.5 mg total) by nebulization every 6 (six) hours as needed for wheezing or shortness of breath. 1080 mL 3 02/21/2018 at Unknown time  . ALPRAZolam (XANAX) 0.25 MG tablet Take 1 tablet by mouth at bedtime as needed for anxiety.    02/21/2018 at Unknown time  . azelastine (ASTELIN) 0.1 % nasal spray Place into both nostrils 2 (two) times daily. Use in each nostril as directed   02/21/2018 at Unknown time  . celecoxib (CELEBREX) 200 MG capsule Take 200 mg by mouth daily.     02/21/2018 at Unknown time  . desvenlafaxine (PRISTIQ) 100 MG 24 hr tablet Take 100 mg by mouth daily.   02/20/2018  . dextromethorphan-guaiFENesin (MUCINEX DM) 30-600 MG 12hr tablet Take 1 tablet by mouth 2 (two) times daily. 14 tablet 1 02/21/2018 at  Unknown time  . fluticasone (FLONASE) 50 MCG/ACT nasal spray Place 2 sprays into both nostrils daily. 16 g 3 02/21/2018 at Unknown time  . Fluticasone-Salmeterol (ADVAIR DISKUS) 250-50 MCG/DOSE AEPB 1 puff and rinse well, twice daily 60 each 3 02/21/2018 at Unknown time  . latanoprost (XALATAN) 0.005 % ophthalmic solution Place 1 drop into both eyes at bedtime.   2 02/20/2018  . levocetirizine (XYZAL) 5 MG tablet Take 5 mg by mouth every evening.   02/20/2018  . levothyroxine (SYNTHROID, LEVOTHROID) 50 MCG tablet Take 1 tablet by mouth daily.   02/21/2018 at Unknown time  . losartan-hydrochlorothiazide (HYZAAR) 100-12.5 MG per tablet Take 1 tablet by mouth daily.     02/21/2018 at Unknown time  . Multiple Vitamin (MULTIVITAMIN) tablet Take 1 tablet by mouth daily.   02/21/2018 at Unknown time  . pantoprazole (PROTONIX) 40 MG tablet TK 1 T PO QD  0 02/21/2018 at Unknown time  . pregabalin (LYRICA) 75 MG capsule Take 75 mg by mouth 2 (two) times daily.   02/20/2018  . ranitidine (ZANTAC) 300 MG tablet    02/20/2018  . rOPINIRole (REQUIP) 2 MG tablet   1 02/20/2018  . Vitamin D, Ergocalciferol, (DRISDOL) 50000 units CAPS capsule Take 50,000 Units by mouth once a week.   02/18/2018  . albuterol (PROVENTIL HFA;VENTOLIN HFA) 108 (  90 Base) MCG/ACT inhaler Inhale 1-2 puffs into the lungs every 6 (six) hours as needed for wheezing or shortness of breath. (Patient not taking: Reported on 02/22/2018) 1 Inhaler 1 Not Taking at Unknown time  . budesonide (PULMICORT) 0.25 MG/2ML nebulizer solution Take 2 mLs (0.25 mg total) by nebulization 2 (two) times daily. (Patient not taking: Reported on 02/22/2018) 180 mL 3 Not Taking at Unknown time  . HYDROcodone-acetaminophen (NORCO) 5-325 MG tablet Take 1 tablet by mouth every 6 (six) hours as needed. (Patient not taking: Reported on 02/22/2018) 20 tablet 0 Not Taking at Unknown time    Assessment: 65 YOF here with SOB found to have bilateral PE with RHS. Hg trend down to 11.6, Plt wnl. She is not  on any anticoagulation prior to admission. Initial heparin level high at 1.58. Per discussion with RN, level drawn appropriately from opposite arm. Will hold heparin x 1 hour and resume at reduced rate per protocol. No bleeding or issues with the drip per RN.  Goal of Therapy:  Heparin level 0.3-0.7 units/ml Monitor platelets by anticoagulation protocol: Yes   Plan:  -Hold heparin x 1 hour and resume at 1000 units/hr at 1430 - communicated plan with RN  -6h heparin level from resumption -Monitor daily heparin level, CBC, and s/sx of bleeding  -F/u plans for long-term anticoagulation  Elicia Lamp, PharmD, BCPS Clinical Pharmacist Clinical phone for 02/22/2018 until 3:30pm: [phone] If after 3:30pm, please call main pharmacy at: [phone] 02/22/2018 1:24 PM

## 2018-02-22 NOTE — Consult Note (Signed)
Name: [name] MRN: [id number] DOB: 1946-11-28    ADMISSION DATE:  02/21/2018 CONSULTATION DATE:  3/7  REFERRING MD :  Evangeline Gula [facility])   CHIEF COMPLAINT:  Submassive PE   BRIEF PATIENT DESCRIPTION: 72 year old female with history of hypertension, asthma, hypothyroidism, GERD admitted 3/6 with submassive PE.   SIGNIFICANT EVENTS    STUDIES:  CTA chest 3/7>>> 1. Findings consistent with acute bilateral pulmonary emboli involving the distal right and left main pulmonary arteries with extension of thrombus into the inter lobar arteries and segmental and subsegmental branches of all lobes of the lung. Positive for acute PE with CT evidence of right heart strain (RV/LV Ratio = 1.8) consistent with at least submassive (intermediate risk) PE. The presence of right heart strain has been associated with an increased risk of morbidity and mortality. Please activate Code PE by paging [phone]. 2. Clear lung fields   HISTORY OF PRESENT ILLNESS: 72yo female with history of hypertension, asthma, hypothyroidism, GERD who presented to ER 3/6 with shortness of breath and chest pain.  She reportedly has had approximately 2-month history of upper respiratory symptoms including sinus symptoms, shortness of breath, cough, chest pain treated as an outpatient for sinusitis and bronchitis with antibiotics and steroids.  She initially felt a bit better but returned 3/6 with worsening of her shortness of breath and chest pain.  CTA chest revealed submassive PE, she was started on heparin drip and P CCM consulted for further assistance.  Of note she did have outpatient surgery approximately 1 month ago for Dupuytren contracture release and has been fairly sedentary since that.  No known history of PE or DVT.  No recent travel.  No known history of cancer.  No known family hx of coagulation disorders.    PAST MEDICAL HISTORY :   has a past medical history of Allergic rhinitis, Allergic rhinitis,  Anxiety, Arthritis, Chronic cough, Dupuytren contracture, GERD (gastroesophageal reflux disease), Hypertension, Hypothyroidism, and Mild persistent asthma.  has a past surgical history that includes Bunionectomy; Total abdominal hysterectomy; Dilation and curettage of uterus; BLADDER TACK; Knee arthroscopy; and Fasciectomy (Right, 01/23/2018). Prior to Admission medications   Medication Sig Start Date End Date Taking? Authorizing Provider  albuterol (PROAIR HFA) 108 (90 BASE) MCG/ACT inhaler Inhale 2 puffs into the lungs every 4 (four) hours as needed for wheezing or shortness of breath. 11/20/14  Yes Young, Tarri Fuller D, MD  albuterol (PROVENTIL) (2.5 MG/3ML) 0.083% nebulizer solution Take 3 mLs (2.5 mg total) by nebulization every 6 (six) hours as needed for wheezing or shortness of breath. 11/26/14  Yes Young, Tarri Fuller D, MD  ALPRAZolam Duanne Moron) 0.25 MG tablet Take 1 tablet by mouth at bedtime as needed for anxiety.  09/29/11  Yes [provider]  azelastine (ASTELIN) 0.1 % nasal spray Place into both nostrils 2 (two) times daily. Use in each nostril as directed   Yes [provider]  celecoxib (CELEBREX) 200 MG capsule Take 200 mg by mouth daily.     Yes [provider]  desvenlafaxine (PRISTIQ) 100 MG 24 hr tablet Take 100 mg by mouth daily. 02/15/18  Yes [provider]  dextromethorphan-guaiFENesin (MUCINEX DM) 30-600 MG 12hr tablet Take 1 tablet by mouth 2 (two) times daily. 02/13/18  Yes Fredia Sorrow, MD  fluticasone (FLONASE) 50 MCG/ACT nasal spray Place 2 sprays into both nostrils daily. 03/12/15  Yes Baird Lyons D, MD  Fluticasone-Salmeterol (ADVAIR DISKUS) 250-50 MCG/DOSE AEPB 1 puff and rinse well, twice daily 03/12/15  Yes Young, [place]  D, MD  latanoprost (XALATAN) 0.005 % ophthalmic solution Place 1 drop into both eyes at bedtime.  08/18/15  Yes [provider]  levocetirizine (XYZAL) 5 MG tablet Take 5 mg by mouth every evening.   Yes [provider]  levothyroxine (SYNTHROID, LEVOTHROID) 50 MCG tablet Take 1 tablet by mouth daily. 09/02/13  Yes [provider]  losartan-hydrochlorothiazide (HYZAAR) 100-12.5 MG per tablet Take 1 tablet by mouth daily.     Yes [provider]  Multiple Vitamin (MULTIVITAMIN) tablet Take 1 tablet by mouth daily.   Yes [provider]  pantoprazole (PROTONIX) 40 MG tablet TK 1 T PO QD 08/29/15  Yes [provider]  pregabalin (LYRICA) 75 MG capsule Take 75 mg by mouth 2 (two) times daily.   Yes [provider]  ranitidine (ZANTAC) 300 MG tablet  11/03/14  Yes [provider]  rOPINIRole (REQUIP) 2 MG tablet  11/10/14  Yes [provider]  Vitamin D, Ergocalciferol, (DRISDOL) 50000 units CAPS capsule Take 50,000 Units by mouth once a week. 12/05/16  Yes [provider]  albuterol (PROVENTIL HFA;VENTOLIN HFA) 108 (90 Base) MCG/ACT inhaler Inhale 1-2 puffs into the lungs every 6 (six) hours as needed for wheezing or shortness of breath. Patient not taking: Reported on 02/22/2018 02/13/18   Fredia Sorrow, MD  budesonide (PULMICORT) 0.25 MG/2ML nebulizer solution Take 2 mLs (0.25 mg total) by nebulization 2 (two) times daily. Patient not taking: Reported on 02/22/2018 11/26/14   Deneise Lever, MD  HYDROcodone-acetaminophen (NORCO) 5-325 MG tablet Take 1 tablet by mouth every 6 (six) hours as needed. Patient not taking: Reported on 02/22/2018 01/23/18   Daryll Brod, MD   Allergies  Allergen Reactions  . Naproxen     FAMILY HISTORY:  family history includes Arthritis in her mother; Pneumonia in her father. SOCIAL HISTORY:  reports that  has never smoked. she has never used smokeless tobacco. She reports that she drinks alcohol. She reports that she does not use drugs.  REVIEW OF SYSTEMS:   As per HPI - All other systems reviewed and were neg.    SUBJECTIVE:   VITAL SIGNS: Temp:  [99.2 F (37.3 C)] 99.2 F (37.3 C) (03/06  2008) Pulse Rate:  [95-123] 96 (03/07 0808) Resp:  [14-31] 14 (03/07 0808) BP: (105-162)/(58-114) 134/77 (03/07 0808) SpO2:  [91 %-97 %] 97 % (03/07 0808)  PHYSICAL EXAMINATION: General:  Pleasant female, NAD  Neuro:  Awake, alert, appropriate, MAE  HEENT:  Mm moist, no JVD  Cardiovascular:  s1s2 rrr Lungs:  resps even non labored on 2L [place], clear  Abdomen:  Round, soft, +bs  Musculoskeletal:  Warm and dry, no edema   Recent Labs  Lab 02/21/18 2020  NA 136  K 4.4  CL 104  CO2 18*  BUN 23*  CREATININE 1.26*  GLUCOSE 313*   Recent Labs  Lab 02/21/18 2020 02/22/18 0953  HGB 12.8 11.6*  HCT 39.0 35.1*  WBC 17.9* 13.7*  PLT 254 237   Dg Chest 2 View  Result Date: 02/21/2018 CLINICAL DATA:  Chest pain and shortness of breath.  Cough. EXAM: CHEST - 2 VIEW COMPARISON:  02/13/2018 and 11/20/2014 FINDINGS: The heart size and mediastinal contours are within normal limits. Both lungs are clear. Arthritic changes at the left shoulder. IMPRESSION: No active cardiopulmonary disease. Electronically Signed   By: Lorriane Shire M.D.   On: 02/21/2018 20:44   Ct Angio Chest Pe W And/or Wo Contrast  Result Date:  02/21/2018 CLINICAL DATA:  Increased short of breath and chest pain EXAM: CT ANGIOGRAPHY CHEST WITH CONTRAST TECHNIQUE: Multidetector CT imaging of the chest was performed using the standard protocol during bolus administration of intravenous contrast. Multiplanar CT image reconstructions and MIPs were obtained to evaluate the vascular anatomy. CONTRAST:  79 mL ISOVUE-370 IOPAMIDOL (ISOVUE-370) INJECTION 76% COMPARISON:  Chest x-ray 02/21/2018 FINDINGS: Cardiovascular: Satisfactory opacification of the pulmonary arteries to the segmental level. Thrombus present within the distal right and left pulmonary arteries with thrombus extending into the inter lobar arteries an segmental and subsegmental arteries of the upper, lower, and right middle lobes. RV LV ratio is positive at 1.8. Reflux of  contrast into the hepatic veins also consistent with right heart strain. Heart size within normal limits. No pericardial effusion. Mild aortic atherosclerosis. Coronary artery calcification. Mediastinum/Nodes: No enlarged mediastinal, hilar, or axillary lymph nodes. Thyroid gland, trachea, and esophagus demonstrate no significant findings. Lungs/Pleura: Lungs are clear. No pleural effusion or pneumothorax. Upper Abdomen: Cyst in the left hepatic lobe. No acute abnormality in the upper abdomen Musculoskeletal: No chest wall abnormality. No acute or significant osseous findings. Review of the MIP images confirms the above findings. IMPRESSION: 1. Findings consistent with acute bilateral pulmonary emboli involving the distal right and left main pulmonary arteries with extension of thrombus into the inter lobar arteries and segmental and subsegmental branches of all lobes of the lung. Positive for acute PE with CT evidence of right heart strain (RV/LV Ratio = 1.8) consistent with at least submassive (intermediate risk) PE. The presence of right heart strain has been associated with an increased risk of morbidity and mortality. Please activate Code PE by paging [phone]. 2. Clear lung fields Critical Value/emergent results were called by telephone at the time of interpretation on 02/21/2018 at 9:52 pm to Dr. Gareth Morgan , who verbally acknowledged these results. Aortic Atherosclerosis (ICD10-I70.0). Electronically Signed   By: Donavan Foil M.D.   On: 02/21/2018 21:53    ASSESSMENT / PLAN:  Submassive PE --hemodynamically stable.  On 2L.  She did have initial troponin elevated at 1.58.   Had negative mammogram 06/2017 Gets yearly colonoscopy - her mother had colon cancer  Plan- Continue heparin drip for now No indication for thrombolytics at this time Echo pending Trend troponin Monitor hemodynamics Supple mental O2 as needed to keep sats greater than 92% BLE venous Dopplers pending Suspect PE is  related to recent surgery and sedentary lifestyle - consider 6 months anticoagulation   Nickolas Madrid, NP 02/22/2018  1:31 PM Pager: [phone] or [phone]

## 2018-02-22 NOTE — Plan of Care (Addendum)
72 yo F with submassive PE, Trop 1.5, HR 110s-120s.  EDP spoke with PCCM, obviously doesn't need TPA right now given that BPs are looking good right now.  He recommended 2d echo in AM.  Will put in for SDU, but no SDUs available so may be a while.  On heparin gtt for now.  Please page flow manager on patient arrival to [facility] unit.

## 2018-02-22 NOTE — H&P (Signed)
History and Physical    [name] ASN:[id number] DOB: [birth year] DOA: 02/21/2018  PCP: Briscoe Deutscher, MD  Patient coming from:   I have personally briefly reviewed patient's old medical records in [place]  Chief Complaint: Short of breath intermittently times weeks with associated chest pain  HPI: [name] is a 72 y.o. female with medical history significant of hypothyroidism, mild intermittent asthma, associated allergic rhinitis, essential hypertension, gastroesophageal reflux disease, and a recent approximate 8-month history of upper respiratory and respiratory symptoms including sinusitis, bronchitis, and chest pains who presents to the emergency department acutely short of breath anxious and tearful at [facility].  She was evaluated by Dr. Billy Fischer due to shortness of breath at rest on presentation.  She reports that she has had weeks of shortness of breath with exertion and she has seen her doctor several times.  She was diagnosed with sinusitis infections, cold, and bronchitis.  Recently she was on steroids which were discontinued on Saturday after 5 days.  Improvement for a few days but today his breath worsened and she felt very very weak.  He is short of breath at rest she has burning pain in the center of her chest with pressure up into her throat.  She feels like she is having trouble swallowing.  The burning pain is also going into her back.  She does not have any associated fevers chills nausea or vomiting.  She has had coughing on and off but no increase in that today.  He denies abdominal pain, dizziness, lightheadedness, headache blurry vision or double vision.  She reports no leg swelling and no hemoptysis.  She does have dyspnea when lying flat.  Is recently been using a few pillows to sleep on just to breathe better.  And this has been going on for several months.  Has been treated with a course of antibiotics and steroids this morning at urgent  care but she did not get any relief from that.  When her symptoms worsen she went to the emergency department.  She has an appointment with her pulmonologist Dr. Baird Lyons today however she was admitted overnight through the [facility] emergency department.  Of note patient had outpatient surgery 1 month ago for Dupuytren contracture release, she has had no hospitalization.  She does report that she has been fairly sedentary over the past month.  She also had a breast reduction done in the fall.  She has no prior history of deep venous thrombosis no long car trips and no known history of cancer.  CT angiogram of the chest was obtained in the emergency department and revealed a right and left main pulmonary artery thromboses which appeared to be submassive.  She is being admitted for further evaluation and management of pulmonary embolism.  Review of Systems: As per HPI otherwise all other systems reviewed and  negative.    Past Medical History:  Diagnosis Date  . Allergic rhinitis   . Allergic rhinitis   . Anxiety   . Arthritis   . Chronic cough   . Dupuytren contracture    RMF  . GERD (gastroesophageal reflux disease)   . Hypertension   . Hypothyroidism   . Mild persistent asthma     Past Surgical History:  Procedure Laterality Date  . BLADDER TACK     X 2  . BUNIONECTOMY     RIGHT FOOT ARCH REPAIRED AS WELL  . DILATION AND CURETTAGE OF UTERUS  X 3  . FASCIECTOMY Right 01/23/2018   Procedure: SEGMENTAL FASCIECTOMY RIGHT MIDDLE FINGER;  Surgeon: Daryll Brod, MD;  Location: [place];  Service: Orthopedics;  Laterality: Right;  AXILLARY BLOCK  . KNEE ARTHROSCOPY     RIGHT  . TOTAL ABDOMINAL HYSTERECTOMY       reports that  has never smoked. she has never used smokeless tobacco. She reports that she drinks alcohol. She reports that she does not use drugs.  Allergies  Allergen Reactions  . Naproxen     Family History  Problem Relation Age of  Onset  . Pneumonia Father   . Arthritis Mother      Prior to Admission medications   Medication Sig Start Date End Date Taking? Authorizing Provider  albuterol (PROAIR HFA) 108 (90 BASE) MCG/ACT inhaler Inhale 2 puffs into the lungs every 4 (four) hours as needed for wheezing or shortness of breath. 11/20/14  Yes Young, Tarri Fuller D, MD  albuterol (PROVENTIL) (2.5 MG/3ML) 0.083% nebulizer solution Take 3 mLs (2.5 mg total) by nebulization every 6 (six) hours as needed for wheezing or shortness of breath. 11/26/14  Yes Young, Tarri Fuller D, MD  ALPRAZolam Duanne Moron) 0.25 MG tablet Take 1 tablet by mouth at bedtime as needed for anxiety.  09/29/11  Yes [provider]  azelastine (ASTELIN) 0.1 % nasal spray Place into both nostrils 2 (two) times daily. Use in each nostril as directed   Yes [provider]  celecoxib (CELEBREX) 200 MG capsule Take 200 mg by mouth daily.     Yes [provider]  desvenlafaxine (PRISTIQ) 100 MG 24 hr tablet Take 100 mg by mouth daily. 02/15/18  Yes [provider]  dextromethorphan-guaiFENesin (MUCINEX DM) 30-600 MG 12hr tablet Take 1 tablet by mouth 2 (two) times daily. 02/13/18  Yes Fredia Sorrow, MD  fluticasone (FLONASE) 50 MCG/ACT nasal spray Place 2 sprays into both nostrils daily. 03/12/15  Yes Baird Lyons D, MD  Fluticasone-Salmeterol (ADVAIR DISKUS) 250-50 MCG/DOSE AEPB 1 puff and rinse well, twice daily 03/12/15  Yes Young, Clinton D, MD  latanoprost (XALATAN) 0.005 % ophthalmic solution Place 1 drop into both eyes at bedtime.  08/18/15  Yes [provider]  levocetirizine (XYZAL) 5 MG tablet Take 5 mg by mouth every evening.   Yes [provider]  levothyroxine (SYNTHROID, LEVOTHROID) 50 MCG tablet Take 1 tablet by mouth daily. 09/02/13  Yes [provider]  losartan-hydrochlorothiazide (HYZAAR) 100-12.5 MG per tablet Take 1 tablet by mouth daily.     Yes [provider]  Multiple Vitamin  (MULTIVITAMIN) tablet Take 1 tablet by mouth daily.   Yes [provider]  pantoprazole (PROTONIX) 40 MG tablet TK 1 T PO QD 08/29/15  Yes [provider]  pregabalin (LYRICA) 75 MG capsule Take 75 mg by mouth 2 (two) times daily.   Yes [provider]  ranitidine (ZANTAC) 300 MG tablet  11/03/14  Yes [provider]  rOPINIRole (REQUIP) 2 MG tablet  11/10/14  Yes [provider]  Vitamin D, Ergocalciferol, (DRISDOL) 50000 units CAPS capsule Take 50,000 Units by mouth once a week. 12/05/16  Yes [provider]  albuterol (PROVENTIL HFA;VENTOLIN HFA) 108 (90 Base) MCG/ACT inhaler Inhale 1-2 puffs into the lungs every 6 (six) hours as needed for wheezing or shortness of breath. Patient not taking: Reported on 02/22/2018 02/13/18   Fredia Sorrow, MD  budesonide (PULMICORT) 0.25 MG/2ML nebulizer solution Take 2 mLs (0.25 mg total) by nebulization 2 (two) times  daily. Patient not taking: Reported on 02/22/2018 11/26/14   Deneise Lever, MD  HYDROcodone-acetaminophen (NORCO) 5-325 MG tablet Take 1 tablet by mouth every 6 (six) hours as needed. Patient not taking: Reported on 02/22/2018 01/23/18   Daryll Brod, MD    Physical Exam: Vitals:   02/22/18 0500 02/22/18 0600 02/22/18 0731 02/22/18 0808  BP: (!) 149/87 (!) 156/93 (!) 162/74 134/77  Pulse: 95 96 98 96  Resp: 17 20 19 14   Temp:      TempSrc:      SpO2: 93% 95% 96% 97%   .TCS Constitutional: NAD, calm, comfortable Vitals:   02/22/18 0500 02/22/18 0600 02/22/18 0731 02/22/18 0808  BP: (!) 149/87 (!) 156/93 (!) 162/74 134/77  Pulse: 95 96 98 96  Resp: 17 20 19 14   Temp:      TempSrc:      SpO2: 93% 95% 96% 97%   Eyes: PERRL, lids and conjunctivae normal ENMT: Mucous membranes are moist. Posterior pharynx clear of any exudate or lesions.Normal dentition.  Neck: normal, supple, no masses, no thyromegaly Respiratory: clear to auscultation bilaterally, no wheezing, no crackles. Normal  respiratory effort. No accessory muscle use.  Cardiovascular: Regular rate and rhythm, no murmurs / rubs / gallops. No extremity edema. 2+ pedal pulses. No carotid bruits.  Abdomen: no tenderness, no masses palpated. No hepatosplenomegaly. Bowel sounds positive.  Musculoskeletal: no clubbing / cyanosis. No joint deformity upper and lower extremities. Good ROM, no contractures. Normal muscle tone.  Skin: no rashes, lesions, ulcers. No induration Neurologic: CN 2-12 grossly intact. Sensation intact, DTR normal. Strength 5/5 in all 4.  Psychiatric: Normal judgment and insight. Alert and oriented x 3.  Very anxious appears to be on the borderline of tearfulness.  Clearly distraught over her diagnosis   Labs on Admission: I have personally reviewed following labs and imaging studies  CBC: Recent Labs  Lab 02/21/18 2020 02/22/18 0953  WBC 17.9* 13.7*  NEUTROABS 10.6  --   HGB 12.8 11.6*  HCT 39.0 35.1*  MCV 86.5 86.5  PLT 254 854   Basic Metabolic Panel: Recent Labs  Lab 02/21/18 2020  NA 136  K 4.4  CL 104  CO2 18*  GLUCOSE 313*  BUN 23*  CREATININE 1.26*  CALCIUM 9.3   GFR: CrCl cannot be calculated (Unknown ideal weight.). Liver Function Tests: Recent Labs  Lab 02/21/18 2020  AST 28  ALT 23  ALKPHOS 94  BILITOT 0.5  PROT 6.8  ALBUMIN 3.8   No results for input(s): LIPASE, AMYLASE in the last 168 hours. No results for input(s): AMMONIA in the last 168 hours. Coagulation Profile: No results for input(s): INR, PROTIME in the last 168 hours. Cardiac Enzymes: Recent Labs  Lab 02/21/18 2020  TROPONINI 1.58*   BNP (last 3 results) No results for input(s): PROBNP in the last 8760 hours. HbA1C: No results for input(s): HGBA1C in the last 72 hours. CBG: No results for input(s): GLUCAP in the last 168 hours. Lipid Profile: No results for input(s): CHOL, HDL, LDLCALC, TRIG, CHOLHDL, LDLDIRECT in the last 72 hours. Thyroid Function Tests: Recent Labs     02/21/18 2020  TSH 1.162  FREET4 1.41*   Anemia Panel: No results for input(s): VITAMINB12, FOLATE, FERRITIN, TIBC, IRON, RETICCTPCT in the last 72 hours. Urine analysis:    Component Value Date/Time   COLORURINE AMBER (A) 02/13/2018 1739   APPEARANCEUR HAZY (A) 02/13/2018 1739   LABSPEC 1.032 (H) 02/13/2018 1739   PHURINE 5.0 02/13/2018 1739  GLUCOSEU NEGATIVE 02/13/2018 1739   HGBUR NEGATIVE 02/13/2018 1739   BILIRUBINUR NEGATIVE 02/13/2018 1739   KETONESUR 5 (A) 02/13/2018 1739   PROTEINUR NEGATIVE 02/13/2018 1739   NITRITE NEGATIVE 02/13/2018 1739   LEUKOCYTESUR MODERATE (A) 02/13/2018 1739    Radiological Exams on Admission: Dg Chest 2 View  Result Date: 02/21/2018 CLINICAL DATA:  Chest pain and shortness of breath.  Cough. EXAM: CHEST - 2 VIEW COMPARISON:  02/13/2018 and 11/20/2014 FINDINGS: The heart size and mediastinal contours are within normal limits. Both lungs are clear. Arthritic changes at the left shoulder. IMPRESSION: No active cardiopulmonary disease. Electronically Signed   By: Lorriane Shire M.D.   On: 02/21/2018 20:44   Ct Angio Chest Pe W And/or Wo Contrast  Result Date: 02/21/2018 CLINICAL DATA:  Increased short of breath and chest pain EXAM: CT ANGIOGRAPHY CHEST WITH CONTRAST TECHNIQUE: Multidetector CT imaging of the chest was performed using the standard protocol during bolus administration of intravenous contrast. Multiplanar CT image reconstructions and MIPs were obtained to evaluate the vascular anatomy. CONTRAST:  79 mL ISOVUE-370 IOPAMIDOL (ISOVUE-370) INJECTION 76% COMPARISON:  Chest x-ray 02/21/2018 FINDINGS: Cardiovascular: Satisfactory opacification of the pulmonary arteries to the segmental level. Thrombus present within the distal right and left pulmonary arteries with thrombus extending into the inter lobar arteries an segmental and subsegmental arteries of the upper, lower, and right middle lobes. RV LV ratio is positive at 1.8. Reflux of contrast  into the hepatic veins also consistent with right heart strain. Heart size within normal limits. No pericardial effusion. Mild aortic atherosclerosis. Coronary artery calcification. Mediastinum/Nodes: No enlarged mediastinal, hilar, or axillary lymph nodes. Thyroid gland, trachea, and esophagus demonstrate no significant findings. Lungs/Pleura: Lungs are clear. No pleural effusion or pneumothorax. Upper Abdomen: Cyst in the left hepatic lobe. No acute abnormality in the upper abdomen Musculoskeletal: No chest wall abnormality. No acute or significant osseous findings. Review of the MIP images confirms the above findings. IMPRESSION: 1. Findings consistent with acute bilateral pulmonary emboli involving the distal right and left main pulmonary arteries with extension of thrombus into the inter lobar arteries and segmental and subsegmental branches of all lobes of the lung. Positive for acute PE with CT evidence of right heart strain (RV/LV Ratio = 1.8) consistent with at least submassive (intermediate risk) PE. The presence of right heart strain has been associated with an increased risk of morbidity and mortality. Please activate Code PE by paging [phone]. 2. Clear lung fields Critical Value/emergent results were called by telephone at the time of interpretation on 02/21/2018 at 9:52 pm to Dr. Gareth Morgan , who verbally acknowledged these results. Aortic Atherosclerosis (ICD10-I70.0). Electronically Signed   By: Donavan Foil M.D.   On: 02/21/2018 21:53    EKG: Independently reviewed.  Personally viewed by me shows a sinus tachycardia but no evidence of S1, Q 3, T3.  Assessment/Plan Principal Problem:   Acute pulmonary embolism (HCC) Active Problems:   Type 2 diabetes mellitus with stage 3 chronic kidney disease (HCC)   Hypothyroidism   Asthma, mild intermittent   Elevated troponin   Leukocytosis   Seasonal and perennial allergic rhinitis   Esophageal reflux   Essential hypertension   1.   Acute submassive pulmonary embolism: Patient will undergo a stat echocardiogram.  I have consulted Dr. Pati Gallo from pulmonary medicine who will be evaluating the patient.  She is to continue on the heparin drip which was begun at [facility] emergency department.  She would likely need to  transition to a novel oral anticoagulant when evaluation has been completed.  Currently she appears to be more stable than on presentation originally in the emergency department.  He was initially tachypneic tachycardic and short of breath.  She states that she continues to have chest pain and will treat that with anticoagulation.  Some as needed moderate dose pain medication has been ordered please see orders.  Further management pending results of echocardiogram.  2.  Type 2 diabetes mellitus with stage III chronic kidney disease: This is a new diagnosis that is probable, I will be doing further evaluation including hemoglobin A1c, fingerstick blood glucoses and treating renal insufficiency with hydration and rechecking in a.m. all of the patient's blood glucoses since January have been elevated today she presented with a glucose of 300.  She did apparently receive a course of steroids in the emergency department which may complicate a diagnosis.  She is now off steroids will monitor her glucoses carefully.  We will treat her with sliding scale insulin coverage.  Her creatinine has been steadily going up since January.  Hydrate the patient and reassess tomorrow.  Hemoglobin A1c is pending  3.  Hypothyroidism: TSH recently checked on 6 March reveals 1.162 is within the reference range her T4 was slightly elevated.  We will continue to monitor.  4.  Mild intermittent asthma: We will continue her home steroid inhalers as well as albuterol nebulizers.  5.  Elevated troponin: Likely this is related to pulmonary embolism and not actually cardiac strain.  We will check an echocardiogram and serial troponins as well.   EKG is pending.  6.  Leukocytosis: Likely this is reactive due to large pulmonary embolism will recheck in a.m.  7.  Seasonal and perennial allergic rhinitis: Patient will be given guaifenesin we will continue her nasal steroid inhaler.  8.  Esophageal reflux disease: The patient is on both H2 blocker and a proton pump inhibitor.  This suggest that she has very severe disease.  We will continue these medications.  9.  Essential hypertension continue home medicines which include losartan hydrochlorothiazide.  10.  Depression with anxiety continue Xanax and Pristiq.  We will substitute venlafaxine while in the hospital.  11.  Vessels leg syndrome: Continue Lyrica.  Monitor for interactions with other medications.  DVT prophylaxis: Heparin drip Code Status: Full code Family Communication: Spoke with patient's brother and her daughter who are present in the room. Disposition Plan: Likely home in 3-4 days Consults called: Dr. Pati Gallo from pulmonary Admission status: Inpatient   Lady Deutscher MD [place] [facility] Pager [phone]  If 7PM-7AM, please contact night-coverage [email or web address] Password [facility]  02/22/2018, 12:31 PM

## 2018-02-23 ENCOUNTER — Inpatient Hospital Stay (HOSPITAL_COMMUNITY): Payer: Medicare Other

## 2018-02-23 DIAGNOSIS — R748 Abnormal levels of other serum enzymes: Secondary | ICD-10-CM

## 2018-02-23 DIAGNOSIS — N183 Chronic kidney disease, stage 3 (moderate): Secondary | ICD-10-CM

## 2018-02-23 DIAGNOSIS — R0902 Hypoxemia: Secondary | ICD-10-CM

## 2018-02-23 DIAGNOSIS — E119 Type 2 diabetes mellitus without complications: Secondary | ICD-10-CM

## 2018-02-23 DIAGNOSIS — I10 Essential (primary) hypertension: Secondary | ICD-10-CM

## 2018-02-23 DIAGNOSIS — I2699 Other pulmonary embolism without acute cor pulmonale: Secondary | ICD-10-CM

## 2018-02-23 DIAGNOSIS — J302 Other seasonal allergic rhinitis: Secondary | ICD-10-CM

## 2018-02-23 DIAGNOSIS — E039 Hypothyroidism, unspecified: Secondary | ICD-10-CM

## 2018-02-23 DIAGNOSIS — Z5181 Encounter for therapeutic drug level monitoring: Secondary | ICD-10-CM

## 2018-02-23 DIAGNOSIS — J4520 Mild intermittent asthma, uncomplicated: Secondary | ICD-10-CM

## 2018-02-23 DIAGNOSIS — J3089 Other allergic rhinitis: Secondary | ICD-10-CM

## 2018-02-23 DIAGNOSIS — Z7901 Long term (current) use of anticoagulants: Secondary | ICD-10-CM

## 2018-02-23 LAB — HEPARIN LEVEL (UNFRACTIONATED)
HEPARIN UNFRACTIONATED: 0.65 [IU]/mL (ref 0.30–0.70)
Heparin Unfractionated: 0.39 IU/mL (ref 0.30–0.70)

## 2018-02-23 LAB — PROTIME-INR
INR: 1.14
Prothrombin Time: 14.5 seconds (ref 11.4–15.2)

## 2018-02-23 LAB — CBC
HEMATOCRIT: 33.4 % — AB (ref 36.0–46.0)
Hemoglobin: 10.8 g/dL — ABNORMAL LOW (ref 12.0–15.0)
MCH: 28.7 pg (ref 26.0–34.0)
MCHC: 32.3 g/dL (ref 30.0–36.0)
MCV: 88.8 fL (ref 78.0–100.0)
PLATELETS: 213 10*3/uL (ref 150–400)
RBC: 3.76 MIL/uL — AB (ref 3.87–5.11)
RDW: 17 % — ABNORMAL HIGH (ref 11.5–15.5)
WBC: 14.5 10*3/uL — AB (ref 4.0–10.5)

## 2018-02-23 LAB — GLUCOSE, CAPILLARY
GLUCOSE-CAPILLARY: 162 mg/dL — AB (ref 65–99)
GLUCOSE-CAPILLARY: 59 mg/dL — AB (ref 65–99)
Glucose-Capillary: 97 mg/dL (ref 65–99)
Glucose-Capillary: 120 mg/dL — ABNORMAL HIGH (ref 65–99)

## 2018-02-23 LAB — TROPONIN I: Troponin I: 1.8 ng/mL (ref <0.03)

## 2018-02-23 MED ORDER — LIVING WELL WITH DIABETES BOOK
Freq: Once | Status: AC
  Administered 2018-02-23: 20:00:00
  Filled 2018-02-23: qty 1

## 2018-02-23 NOTE — Plan of Care (Signed)
Nutrition Education Note   RD consulted for nutrition education regarding diabetes.   Intern provided "Nutrition and Type II Diabetes" handout from the Academy of [employer] and Dietetics. Discussed sources of carbohydrates and provided temporary goals of 45-60g carbohydrates at meals 3x/day. Discussed the importance of consistent carbohydrate intake throughout the day.   Provided list of carbohydrates and recommended serving sizes of common foods. Discussed the importance of reading a nutrition food label to identify grams of carbohydrates and serving sizes. Provided examples of ways to balance meals/snacks and encouraged intake of high-fiber, whole grain complex carbohydrates.   Teach back method used.  Expect good compliance.  Per Diabetes Coordinator note, pt needs referral to [facility]; RD entered referral.    Body mass index is 32.65 kg/m. Pt meets criteria for obesity based on current BMI.  Current diet order is HH/Cho Mod patient is consuming approximately 100% of meals at this time.   Labs and medications reviewed.   Lab Results  Component Value Date   HGBA1C 6.9 (H) 02/22/2018   No further nutrition interventions warranted at this time.  Demetrious Rainford, MS, Dietetic Intern Pager # [phone]

## 2018-02-23 NOTE — Progress Notes (Signed)
Inpatient Diabetes Program Recommendations  AACE/ADA: New Consensus Statement on Inpatient Glycemic Control (2015)  Target Ranges:  Prepandial:   less than 140 mg/dL      Peak postprandial:   less than 180 mg/dL (1-2 hours)      Critically ill patients:  140 - 180 mg/dL   Lab Results  Component Value Date   GLUCAP 120 (H) 02/23/2018   HGBA1C 6.9 (H) 02/22/2018    Review of Glycemic Control  Diabetes history: No prior hx Inpatient Diabetes Program Recommendations:   Noted patient new onset DM 2. Ordered Living Well With Diabetes, dietician consult, and patient education videos. Will plan to meet with patient @ bedside.  Thank you, Nani Gasser. Wilmer Berryhill, RN, MSN, CDE  Diabetes Coordinator Inpatient Glycemic Control Team Team Pager [phone] (8am-5pm) 02/23/2018 1:44 PM

## 2018-02-23 NOTE — Progress Notes (Addendum)
Name: [name] MRN: [id number] DOB: [birth year]    ADMISSION DATE:  02/21/2018 CONSULTATION DATE:  3/7  REFERRING MD :  [facility])   CHIEF COMPLAINT:  Submassive PE   BRIEF PATIENT DESCRIPTION: 72 year old female with history of hypertension, asthma, hypothyroidism, GERD admitted 3/6 with submassive PE.   SIGNIFICANT EVENTS  3/7 > Admit.  STUDIES:  CTA chest 3/7 > b/l PE involving distal right and left main pulmonary arteries, RV / LV 1.8, clear lung fields. Echo 3/7 >  EF 60 - 65%, G1DD, PAP 54.   HISTORY OF PRESENT ILLNESS: 72yo female with history of hypertension, asthma, hypothyroidism, GERD who presented to ER 3/6 with shortness of breath and chest pain.  She reportedly has had approximately 23-month history of upper respiratory symptoms including sinus symptoms, shortness of breath, cough, chest pain treated as an outpatient for sinusitis and bronchitis with antibiotics and steroids.  She initially felt a bit better but returned 3/6 with worsening of her shortness of breath and chest pain.  CTA chest revealed submassive PE, she was started on heparin drip and P CCM consulted for further assistance.  Of note she did have outpatient surgery approximately 1 month ago for Dupuytren contracture release and has been fairly sedentary since that.  No known history of PE or DVT.  No recent travel.  No known history of cancer.  No known family hx of coagulation disorders.     SUBJECTIVE: No acute events.  Breathing improved, on room air.  VITAL SIGNS: Temp:  [97.7 F (36.5 C)-98.2 F (36.8 C)] 97.9 F (36.6 C) (03/08 0854) Pulse Rate:  [87-112] 87 (03/08 0918) Resp:  [14-20] 18 (03/08 0918) BP: (110-141)/(66-91) 131/72 (03/08 0918) SpO2:  [96 %-98 %] 96 % (03/08 0918) Weight:  [80.7 kg (178 lb)-81 kg (178 lb 8 oz)] 81 kg (178 lb 8 oz) (03/08 0254)  PHYSICAL EXAMINATION:  General:  Pleasant female up in bed, NAD  Neuro:  Awake, alert, appropriate, MAE  HEENT:  Mm  moist, no JVD  Cardiovascular:  RRR, no M/R/G Lungs:  resps even non labored on RA, clear  Abdomen:  Round, soft, +bs  Musculoskeletal:  Warm and dry, no edema   Recent Labs  Lab 02/21/18 2020  NA 136  K 4.4  CL 104  CO2 18*  BUN 23*  CREATININE 1.26*  GLUCOSE 313*   Recent Labs  Lab 02/21/18 2020 02/22/18 0953 02/23/18 0246  HGB 12.8 11.6* 10.8*  HCT 39.0 35.1* 33.4*  WBC 17.9* 13.7* 14.5*  PLT 254 237 213   Dg Chest 2 View  Result Date: 02/21/2018 CLINICAL DATA:  Chest pain and shortness of breath.  Cough. EXAM: CHEST - 2 VIEW COMPARISON:  02/13/2018 and 11/20/2014 FINDINGS: The heart size and mediastinal contours are within normal limits. Both lungs are clear. Arthritic changes at the left shoulder. IMPRESSION: No active cardiopulmonary disease. Electronically Signed   By: Lorriane Shire M.D.   On: 02/21/2018 20:44   Ct Angio Chest Pe W And/or Wo Contrast  Result Date: 02/21/2018 CLINICAL DATA:  Increased short of breath and chest pain EXAM: CT ANGIOGRAPHY CHEST WITH CONTRAST TECHNIQUE: Multidetector CT imaging of the chest was performed using the standard protocol during bolus administration of intravenous contrast. Multiplanar CT image reconstructions and MIPs were obtained to evaluate the vascular anatomy. CONTRAST:  79 mL ISOVUE-370 IOPAMIDOL (ISOVUE-370) INJECTION 76% COMPARISON:  Chest x-ray 02/21/2018 FINDINGS: Cardiovascular: Satisfactory opacification of the pulmonary arteries to the segmental level. Thrombus present  within the distal right and left pulmonary arteries with thrombus extending into the inter lobar arteries an segmental and subsegmental arteries of the upper, lower, and right middle lobes. RV LV ratio is positive at 1.8. Reflux of contrast into the hepatic veins also consistent with right heart strain. Heart size within normal limits. No pericardial effusion. Mild aortic atherosclerosis. Coronary artery calcification. Mediastinum/Nodes: No enlarged  mediastinal, hilar, or axillary lymph nodes. Thyroid gland, trachea, and esophagus demonstrate no significant findings. Lungs/Pleura: Lungs are clear. No pleural effusion or pneumothorax. Upper Abdomen: Cyst in the left hepatic lobe. No acute abnormality in the upper abdomen Musculoskeletal: No chest wall abnormality. No acute or significant osseous findings. Review of the MIP images confirms the above findings. IMPRESSION: 1. Findings consistent with acute bilateral pulmonary emboli involving the distal right and left main pulmonary arteries with extension of thrombus into the inter lobar arteries and segmental and subsegmental branches of all lobes of the lung. Positive for acute PE with CT evidence of right heart strain (RV/LV Ratio = 1.8) consistent with at least submassive (intermediate risk) PE. The presence of right heart strain has been associated with an increased risk of morbidity and mortality. Please activate Code PE by paging [phone]. 2. Clear lung fields Critical Value/emergent results were called by telephone at the time of interpretation on 02/21/2018 at 9:52 pm to Dr. Gareth Morgan , who verbally acknowledged these results. Aortic Atherosclerosis (ICD10-I70.0). Electronically Signed   By: Donavan Foil M.D.   On: 02/21/2018 21:53    ASSESSMENT / PLAN:  Submassive PE --hemodynamically stable.  On 2L.  She did have initial troponin elevated at 1.58.   Had negative mammogram 06/2017 Gets yearly colonoscopy - her mother had colon cancer  Plan- Continue heparin drip, no indication for thrombolytics at this time Transition to warfarin or NOAC (pharmacy to discuss with pt today) Supplemental O2 as needed to keep sats greater than 92% BLE venous Dopplers pending Suspect PE is related to recent surgery and sedentary lifestyle - consider 6 months anticoagulation   Nothing further to add.  PCCM will sign off.  Please do not hesitate to call us back if we can be of any further  assistance.   Montey Hora, [facility] Pager: [phone]  or [phone] 02/23/2018, 11:42 AM  Attending Note:  72 year old female with PE that is now being anti-coagulated with heparin.  On exam, she is down to room air at this point with clear lungs.  I reviewed chest CT myself, PE noted.  Discussed with PCCM-NP.  PE:  - Heparin drip  - Will need longterm anti-coagulation  Hypoxemia:  - Ambulate prior to discharge to determine if O2 sat will drop with ambulation, if so then will need home O2.  Anti-coagulation:  - Discussed coumadin vs NOACs at length.  Family and patient has quite a few concerns and would like to speak to pharmacy prior to decision on which drug.  Will arrange for pharmacy to see patient.  PCCM will sign off, please call back if needed.  Patient seen and examined, agree with above note.  I dictated the care and orders written for this patient under my direction.  Rush Farmer, MD [phone]

## 2018-02-23 NOTE — Progress Notes (Signed)
ANTICOAGULATION CONSULT NOTE - Follow Up Consult  Pharmacy Consult for heparin Indication: pulmonary embolus  Labs: Recent Labs    02/21/18 2020 02/22/18 0953 02/22/18 1235 02/22/18 1853 02/22/18 2057 02/23/18 0246 02/23/18 0602  HGB 12.8 11.6*  --   --   --  10.8*  --   HCT 39.0 35.1*  --   --   --  33.4*  --   PLT 254 237  --   --   --  213  --   LABPROT  --   --   --   --   --  14.5  --   INR  --   --   --   --   --  1.14  --   HEPARINUNFRC  --  1.58*  --   --  1.26*  --  0.65  CREATININE 1.26*  --   --   --   --   --   --   TROPONINI 1.58*  --  2.06* 2.16*  --  1.80*  --     Assessment/Plan:  72yo female therapeutic on heparin after rate changes. Will continue gtt at current rate and confirm stable with additional level.   Wynona Neat, PharmD, BCPS  02/23/2018,7:34 AM

## 2018-02-23 NOTE — Progress Notes (Signed)
PROGRESS NOTE    [name]   KDX:[id number]  DOB: [birth year]  DOA: 02/21/2018 PCP: Blair Heys, PA-C   Brief Narrative:  [name]  72 y.o. female with medical history significant of hypothyroidism, mild intermittent asthma, HTN, GERD who presents for dyspnea at rest. Dyspnea has been present for weeks and was seen at urgent care on the morning of admission and treated with antibiotics and steroids. She subsequently went to the ER as her symptoms did not improve. CTA of the chest in the ER revealed a right and left main pulmonary artery  PEs.    Subjective: She feels her dyspnea has improved is significantly improved. No chest pain. ROS: no complaints of nausea, vomiting, constipation diarrhea, cough, or dysuria. No other complaints.   Assessment & Plan:   Principal Problem:   Acute pulmonary embolism - she admits to being quite sedentary in the past 1-2 couple of month due to sinus/respiratory illnesses and a recent release of Dupuytren's contracture  - PCP can consider an outpt hematology eval for further work up - placed on Heparin infusion- pulmonary consulted to give opinion on TPA and did not feel it was needed - 2 D ECHO: mod pulm HTN (54), mod dilated RV, severely reduced RV  Function, mod TR, normal EF and grade 1 dCHF - pulse ox on room air 93%- will need to ambulate to assess for home O2 needs - will start oral anticoagulation later today or early tomorrow AM- the patient would like to talk to pharmacy first  Active Problems:  Elevated Troponin - likely due to cardiac strain from above - Troponin trend:  1.58      2.06   2.16   1.80     DM 2- new diagnosis - CBG 200-300s on admission- she did receive steroids at urgent care - HbA1c found to be 6.9 - have discussed with patient that she has mild diabetes- - as an initial treatment plan, we have decided on diet control with an attempt to lose weight- recommend checking another A1c in about 3-4 months-  will ask for a dietician consult  Stage 3 CKD - follow  Hypothyroidism - cont Synthroid    Asthma, mild intermittent/ postnasal drip - cont Astelin, Mucinex DM, Albuterol, Claritin, Dulera    Esophageal reflux - takes H2 blocker and PPI    Essential hypertension  - holding Hyzaar for today to prevent drop in BP in setting of acute submassive PE with RV dysfunction- follow BP    DVT prophylaxis: Heparin infusion Code Status: Full code Family Communication:  Disposition Plan: home tomorrow Consultants:   PCCM Procedures:   2 D ECHO Study Conclusions  - Left ventricle: The cavity size was normal. There was mild focal   basal hypertrophy of the septum. Systolic function was normal.   The estimated ejection fraction was in the range of 60% to 65%.   Wall motion was normal; there were no regional wall motion   abnormalities. Doppler parameters are consistent with abnormal   left ventricular relaxation (grade 1 diastolic dysfunction). - Aortic valve: There was trivial regurgitation. - Right ventricle: The cavity size was moderately dilated. Systolic   function was severely reduced. - Tricuspid valve: There was moderate regurgitation. - Pulmonary arteries: Systolic pressure was moderately increased.   PA peak pressure: 54 mm Hg (S).  Impressions:  - Normal LV systolic function; mild diastolic dysfunction; trace   AI; moderate RVE with severe RV dysfunction; McConnell&'s sign   noted  suggestive of pulmonary embolus; moderate TR with moderate   pulmonary hypertension. Antimicrobials:  Anti-infectives (From admission, onward)   None       Objective: Vitals:   02/22/18 2134 02/23/18 0023 02/23/18 0254 02/23/18 0429  BP:  130/75  110/66  Pulse: (!) 103 95  99  Resp: 20 16  17   Temp:  97.7 F (36.5 C)  98.1 F (36.7 C)  TempSrc:  Oral  Oral  SpO2: 97% 97%  98%  Weight:   81 kg (178 lb 8 oz)   Height:        Intake/Output Summary (Last 24 hours) at 02/23/2018  0826 Last data filed at 02/23/2018 0600 Gross per 24 hour  Intake 2745.09 ml  Output 1150 ml  Net 1595.09 ml   Filed Weights   02/22/18 2000 02/23/18 0254  Weight: 80.7 kg (178 lb) 81 kg (178 lb 8 oz)    Examination: General exam: Appears comfortable  HEENT: PERRLA, oral mucosa moist, no sclera icterus or thrush Respiratory system: Clear to auscultation. Respiratory effort normal. Cardiovascular system: S1 & S2 heard, RRR.  No murmurs  Gastrointestinal system: Abdomen soft, non-tender, nondistended. Normal bowel sound. No organomegaly Central nervous system: Alert and oriented. No focal neurological deficits. Extremities: No cyanosis, clubbing or edema Skin: No rashes or ulcers Psychiatry:  Mood & affect appropriate.     Data Reviewed: I have personally reviewed following labs and imaging studies  CBC: Recent Labs  Lab 02/21/18 2020 02/22/18 0953 02/23/18 0246  WBC 17.9* 13.7* 14.5*  NEUTROABS 10.6  --   --   HGB 12.8 11.6* 10.8*  HCT 39.0 35.1* 33.4*  MCV 86.5 86.5 88.8  PLT 254 237 967   Basic Metabolic Panel: Recent Labs  Lab 02/21/18 2020  NA 136  K 4.4  CL 104  CO2 18*  GLUCOSE 313*  BUN 23*  CREATININE 1.26*  CALCIUM 9.3   GFR: Estimated Creatinine Clearance: 41 mL/min (A) (by C-G formula based on SCr of 1.26 mg/dL (H)). Liver Function Tests: Recent Labs  Lab 02/21/18 2020  AST 28  ALT 23  ALKPHOS 94  BILITOT 0.5  PROT 6.8  ALBUMIN 3.8   No results for input(s): LIPASE, AMYLASE in the last 168 hours. No results for input(s): AMMONIA in the last 168 hours. Coagulation Profile: Recent Labs  Lab 02/23/18 0246  INR 1.14   Cardiac Enzymes: Recent Labs  Lab 02/21/18 2020 02/22/18 1235 02/22/18 1853 02/23/18 0246  TROPONINI 1.58* 2.06* 2.16* 1.80*   BNP (last 3 results) No results for input(s): PROBNP in the last 8760 hours. HbA1C: Recent Labs    02/22/18 1235  HGBA1C 6.9*   CBG: Recent Labs  Lab 02/22/18 1635 02/22/18 2156  02/23/18 0746  GLUCAP 156* 138* 97   Lipid Profile: No results for input(s): CHOL, HDL, LDLCALC, TRIG, CHOLHDL, LDLDIRECT in the last 72 hours. Thyroid Function Tests: Recent Labs    02/21/18 2020  TSH 1.162  FREET4 1.41*   Anemia Panel: No results for input(s): VITAMINB12, FOLATE, FERRITIN, TIBC, IRON, RETICCTPCT in the last 72 hours. Urine analysis:    Component Value Date/Time   COLORURINE AMBER (A) 02/13/2018 1739   APPEARANCEUR HAZY (A) 02/13/2018 1739   LABSPEC 1.032 (H) 02/13/2018 1739   PHURINE 5.0 02/13/2018 1739   GLUCOSEU NEGATIVE 02/13/2018 1739   HGBUR NEGATIVE 02/13/2018 1739   BILIRUBINUR NEGATIVE 02/13/2018 1739   KETONESUR 5 (A) 02/13/2018 1739   PROTEINUR NEGATIVE 02/13/2018 1739   NITRITE NEGATIVE 02/13/2018  [place] (A) 02/13/2018 1739   Sepsis Labs: @LABRCNTIP (procalcitonin:4,lacticidven:4) ) Recent Results (from the past 240 hour(s))  Urine Culture     Status: None   Collection Time: 02/13/18  5:39 PM  Result Value Ref Range Status   Specimen Description URINE, RANDOM  Final   Special Requests NONE  Final   Culture   Final    NO GROWTH Performed at [facility], [place] [address], [place], [place] [address]    Report Status 02/15/2018 FINAL  Final  MRSA PCR Screening     Status: None   Collection Time: 02/22/18 10:00 AM  Result Value Ref Range Status   MRSA by PCR NEGATIVE NEGATIVE Final    Comment:        The GeneXpert MRSA Assay (FDA approved for NASAL specimens only), is one component of a comprehensive MRSA colonization surveillance program. It is not intended to diagnose MRSA infection nor to guide or monitor treatment for MRSA infections. Performed at [facility], [place] [address], [place], [place] [address]          Radiology Studies: Dg Chest 2 View  Result Date: 02/21/2018 CLINICAL DATA:  Chest pain and shortness of breath.  Cough. EXAM: CHEST - 2 VIEW COMPARISON:  02/13/2018 and 11/20/2014  FINDINGS: The heart size and mediastinal contours are within normal limits. Both lungs are clear. Arthritic changes at the left shoulder. IMPRESSION: No active cardiopulmonary disease. Electronically Signed   By: Lorriane Shire M.D.   On: 02/21/2018 20:44   Ct Angio Chest Pe W And/or Wo Contrast  Result Date: 02/21/2018 CLINICAL DATA:  Increased short of breath and chest pain EXAM: CT ANGIOGRAPHY CHEST WITH CONTRAST TECHNIQUE: Multidetector CT imaging of the chest was performed using the standard protocol during bolus administration of intravenous contrast. Multiplanar CT image reconstructions and MIPs were obtained to evaluate the vascular anatomy. CONTRAST:  79 mL ISOVUE-370 IOPAMIDOL (ISOVUE-370) INJECTION 76% COMPARISON:  Chest x-ray 02/21/2018 FINDINGS: Cardiovascular: Satisfactory opacification of the pulmonary arteries to the segmental level. Thrombus present within the distal right and left pulmonary arteries with thrombus extending into the inter lobar arteries an segmental and subsegmental arteries of the upper, lower, and right middle lobes. RV LV ratio is positive at 1.8. Reflux of contrast into the hepatic veins also consistent with right heart strain. Heart size within normal limits. No pericardial effusion. Mild aortic atherosclerosis. Coronary artery calcification. Mediastinum/Nodes: No enlarged mediastinal, hilar, or axillary lymph nodes. Thyroid gland, trachea, and esophagus demonstrate no significant findings. Lungs/Pleura: Lungs are clear. No pleural effusion or pneumothorax. Upper Abdomen: Cyst in the left hepatic lobe. No acute abnormality in the upper abdomen Musculoskeletal: No chest wall abnormality. No acute or significant osseous findings. Review of the MIP images confirms the above findings. IMPRESSION: 1. Findings consistent with acute bilateral pulmonary emboli involving the distal right and left main pulmonary arteries with extension of thrombus into the inter lobar arteries and  segmental and subsegmental branches of all lobes of the lung. Positive for acute PE with CT evidence of right heart strain (RV/LV Ratio = 1.8) consistent with at least submassive (intermediate risk) PE. The presence of right heart strain has been associated with an increased risk of morbidity and mortality. Please activate Code PE by paging [phone]. 2. Clear lung fields Critical Value/emergent results were called by telephone at the time of interpretation on 02/21/2018 at 9:52 pm to Dr. Gareth Morgan , who verbally acknowledged these results. Aortic Atherosclerosis (ICD10-I70.0).  Electronically Signed   By: Donavan Foil M.D.   On: 02/21/2018 21:53      Scheduled Meds: . azelastine  2 spray Each Nare BID  . dextromethorphan-guaiFENesin  1 tablet Oral BID  . famotidine  20 mg Oral Daily  . fluticasone  2 spray Each Nare Daily  . insulin aspart  0-15 Units Subcutaneous TID WC  . insulin aspart  0-5 Units Subcutaneous QHS  . latanoprost  1 drop Both Eyes QHS  . levothyroxine  50 mcg Oral QAC breakfast  . loratadine  10 mg Oral q1800  . mometasone-formoterol  2 puff Inhalation BID  . multivitamin with minerals  1 tablet Oral Daily  . pantoprazole  40 mg Oral Daily  . pregabalin  75 mg Oral BID  . rOPINIRole  2 mg Oral BID  . sodium chloride flush  3 mL Intravenous Q12H  . venlafaxine XR  150 mg Oral Q breakfast   Continuous Infusions: . 0.9 % NaCl with KCl 20 mEq / L 100 mL/hr at 02/22/18 1323  . heparin 750 Units/hr (02/23/18 0050)     LOS: 1 day    Time spent in minutes: 35    Debbe Odea, MD [facility] Pager: [email or web address] Password [facility] 02/23/2018, 8:26 AM

## 2018-02-23 NOTE — Progress Notes (Signed)
Preliminary notes by tech--Bilateral lower extremities venous exam completed. Right gastrocnemius vein dilated, non-compressible, which is evidence of acute vein thrombosis. Result notified RN Bryson Ha by phone.   Other findings:  Left popliteal fossa, there is a complex fluid collection noted, measuring 2.7x0.9x1.8cm.   Hongying Oceania Noori (RDMS, RVT)

## 2018-02-23 NOTE — Progress Notes (Signed)
[facility] for heparin Indication: pulmonary embolus  Allergies  Allergen Reactions  . Naproxen     Patient Measurements: Ideal body weight: 50.1 kg Actual body weight: 81.5 kg  Heparin Dosing Weight: 68 kg   Vital Signs: Temp: 97.5 F (36.4 C) (03/08 1227) Temp Source: Axillary (03/08 1227) BP: 148/80 (03/08 1227) Pulse Rate: 87 (03/08 0918)  Labs: Recent Labs    02/21/18 2020  02/22/18 0953 02/22/18 1235 02/22/18 1853 02/22/18 2057 02/23/18 0246 02/23/18 0602 02/23/18 1329  HGB 12.8  --  11.6*  --   --   --  10.8*  --   --   HCT 39.0  --  35.1*  --   --   --  33.4*  --   --   PLT 254  --  237  --   --   --  213  --   --   LABPROT  --   --   --   --   --   --  14.5  --   --   INR  --   --   --   --   --   --  1.14  --   --   HEPARINUNFRC  --    < > 1.58*  --   --  1.26*  --  0.65 0.39  CREATININE 1.26*  --   --   --   --   --   --   --   --   TROPONINI 1.58*  --   --  2.06* 2.16*  --  1.80*  --   --    < > = values in this interval not displayed.    Estimated Creatinine Clearance: 41 mL/min (A) (by C-G formula based on SCr of 1.26 mg/dL (H)).   Medical History: Past Medical History:  Diagnosis Date  . Allergic rhinitis   . Allergic rhinitis   . Anxiety   . Arthritis    "hands, feet, shoulders, arms" (02/22/2018)  . Bilateral pulmonary embolism ([place]) 02/21/2018  . Chronic cough   . Depression   . Dupuytren contracture    "both feet, both hands" (02/22/2018)  . GERD (gastroesophageal reflux disease)   . Hypertension   . Hypothyroidism   . Mild persistent asthma     Medications:  Medications Prior to Admission  Medication Sig Dispense Refill Last Dose  . albuterol (PROAIR HFA) 108 (90 BASE) MCG/ACT inhaler Inhale 2 puffs into the lungs every 4 (four) hours as needed for wheezing or shortness of breath. 2 Inhaler prn 02/21/2018 at Unknown time  . albuterol (PROVENTIL) (2.5 MG/3ML) 0.083% nebulizer solution Take 3 mLs  (2.5 mg total) by nebulization every 6 (six) hours as needed for wheezing or shortness of breath. 1080 mL 3 02/21/2018 at Unknown time  . ALPRAZolam (XANAX) 0.25 MG tablet Take 1 tablet by mouth at bedtime as needed for anxiety.    02/21/2018 at Unknown time  . azelastine (ASTELIN) 0.1 % nasal spray Place into both nostrils 2 (two) times daily. Use in each nostril as directed   02/21/2018 at Unknown time  . celecoxib (CELEBREX) 200 MG capsule Take 200 mg by mouth daily.     02/21/2018 at Unknown time  . desvenlafaxine (PRISTIQ) 100 MG 24 hr tablet Take 100 mg by mouth daily.   02/20/2018  . dextromethorphan-guaiFENesin (MUCINEX DM) 30-600 MG 12hr tablet Take 1 tablet by mouth 2 (two) times daily. 14 tablet 1 02/21/2018 at  Unknown time  . fluticasone (FLONASE) 50 MCG/ACT nasal spray Place 2 sprays into both nostrils daily. 16 g 3 02/21/2018 at Unknown time  . Fluticasone-Salmeterol (ADVAIR DISKUS) 250-50 MCG/DOSE AEPB 1 puff and rinse well, twice daily 60 each 3 02/21/2018 at Unknown time  . latanoprost (XALATAN) 0.005 % ophthalmic solution Place 1 drop into both eyes at bedtime.   2 02/20/2018  . levocetirizine (XYZAL) 5 MG tablet Take 5 mg by mouth every evening.   02/20/2018  . levothyroxine (SYNTHROID, LEVOTHROID) 50 MCG tablet Take 1 tablet by mouth daily.   02/21/2018 at Unknown time  . losartan-hydrochlorothiazide (HYZAAR) 100-12.5 MG per tablet Take 1 tablet by mouth daily.     02/21/2018 at Unknown time  . Multiple Vitamin (MULTIVITAMIN) tablet Take 1 tablet by mouth daily.   02/21/2018 at Unknown time  . pantoprazole (PROTONIX) 40 MG tablet TK 1 T PO QD  0 02/21/2018 at Unknown time  . pregabalin (LYRICA) 75 MG capsule Take 75 mg by mouth 2 (two) times daily.   02/20/2018  . ranitidine (ZANTAC) 300 MG tablet    02/20/2018  . rOPINIRole (REQUIP) 2 MG tablet   1 02/20/2018  . Vitamin D, Ergocalciferol, (DRISDOL) 50000 units CAPS capsule Take 50,000 Units by mouth once a week.   02/18/2018  . albuterol (PROVENTIL HFA;VENTOLIN  HFA) 108 (90 Base) MCG/ACT inhaler Inhale 1-2 puffs into the lungs every 6 (six) hours as needed for wheezing or shortness of breath. (Patient not taking: Reported on 02/22/2018) 1 Inhaler 1 Not Taking at Unknown time  . budesonide (PULMICORT) 0.25 MG/2ML nebulizer solution Take 2 mLs (0.25 mg total) by nebulization 2 (two) times daily. (Patient not taking: Reported on 02/22/2018) 180 mL 3 Not Taking at Unknown time  . HYDROcodone-acetaminophen (NORCO) 5-325 MG tablet Take 1 tablet by mouth every 6 (six) hours as needed. (Patient not taking: Reported on 02/22/2018) 20 tablet 0 Not Taking at Unknown time    Assessment: 54 YOF here with SOB found to have bilateral PE with RHS. Hg trend down to 10.8, Plt wnl. She is not on any anticoagulation prior to admission. Heparin level remains therapeutic. No bleed documented. Discussed both Xarelto and warfarin with patient and family per request.  Goal of Therapy:  Heparin level 0.3-0.7 units/ml Monitor platelets by anticoagulation protocol: Yes   Plan:  -Continue heparin at 750 units/hr -Monitor daily heparin level, CBC, and s/sx of bleeding  -F/u plans for long-term anticoagulation - patient and family educated on options  Elicia Lamp, PharmD, BCPS Clinical Pharmacist Clinical phone for 02/23/2018 until 3:30pm: [phone] If after 3:30pm, please call main pharmacy at: [phone] 02/23/2018 3:06 PM

## 2018-02-23 NOTE — Progress Notes (Signed)
Lower extremity dopplers completed. Results paged to Dr. Wynelle Cleveland. Will cont to monitor pt.

## 2018-02-23 NOTE — Progress Notes (Signed)
Spoke with patient and daughter in the room about her new diagnosis of diabetes. Explained the meaning of the 6.9% HgbA1C and the diagnosis of diabetes at 6.5% A1C. Patient states that her brother, who lives with her, has diabetes and is very good at checking his blood sugars and following a healthy diet. States that she is overwhelmed with all the new diagnoses that she has gotten and is very willing to work on getting better.   Living Well with Diabetes booklet has been ordered, dietician consult ordered, will recommend [facility] for classes and individual consults that would need to be ordered by a physician, and will need prescription for home blood glucose meter, strips, and lancets at discharge.  Will continue to monitor blood sugars while in the hospital.  Harvel Ricks RN BSN CDE Diabetes Coordinator Pager: [phone]  8am-5pm

## 2018-02-24 DIAGNOSIS — E1122 Type 2 diabetes mellitus with diabetic chronic kidney disease: Secondary | ICD-10-CM

## 2018-02-24 DIAGNOSIS — N183 Chronic kidney disease, stage 3 unspecified: Secondary | ICD-10-CM

## 2018-02-24 LAB — CBC
HCT: 34.8 % — ABNORMAL LOW (ref 36.0–46.0)
HEMOGLOBIN: 11.3 g/dL — AB (ref 12.0–15.0)
MCH: 28.7 pg (ref 26.0–34.0)
MCHC: 32.5 g/dL (ref 30.0–36.0)
MCV: 88.3 fL (ref 78.0–100.0)
PLATELETS: 215 10*3/uL (ref 150–400)
RBC: 3.94 MIL/uL (ref 3.87–5.11)
RDW: 16.5 % — ABNORMAL HIGH (ref 11.5–15.5)
WBC: 12.4 10*3/uL — ABNORMAL HIGH (ref 4.0–10.5)

## 2018-02-24 LAB — PROTIME-INR
INR: 0.99
Prothrombin Time: 13 seconds (ref 11.4–15.2)

## 2018-02-24 LAB — HEPARIN LEVEL (UNFRACTIONATED): HEPARIN UNFRACTIONATED: 0.54 [IU]/mL (ref 0.30–0.70)

## 2018-02-24 LAB — GLUCOSE, CAPILLARY: Glucose-Capillary: 96 mg/dL (ref 65–99)

## 2018-02-24 MED ORDER — BENZONATATE 100 MG PO CAPS: 100.0000 mg | ORAL_CAPSULE | Freq: Three times a day (TID) | ORAL | 0 refills | Status: DC

## 2018-02-24 MED ORDER — RIVAROXABAN 15 MG PO TABS
15.0000 mg | ORAL_TABLET | Freq: Two times a day (BID) | ORAL | Status: DC
  Administered 2018-02-24 (×2): 15 mg via ORAL
  Filled 2018-02-24 (×2): qty 1

## 2018-02-24 MED ORDER — RIVAROXABAN (XARELTO) VTE STARTER PACK (15 & 20 MG): ORAL_TABLET | ORAL | 0 refills | Status: DC

## 2018-02-24 MED ORDER — FREESTYLE SYSTEM KIT: 1.0000 | PACK | 1 refills | Status: DC | PRN

## 2018-02-24 MED ORDER — BENZONATATE 100 MG PO CAPS: 100.0000 mg | ORAL_CAPSULE | Freq: Three times a day (TID) | ORAL | 0 refills | Status: DC | PRN

## 2018-02-24 MED ORDER — BENZONATATE 100 MG PO CAPS
100.0000 mg | ORAL_CAPSULE | Freq: Three times a day (TID) | ORAL | Status: DC
  Administered 2018-02-24 (×2): 100 mg via ORAL
  Filled 2018-02-24 (×2): qty 1

## 2018-02-24 NOTE — Progress Notes (Signed)
Discharge instructions reviewed with pt. Pt has no questions at this time. Printed prescriptions given to pt. IV d/c. Pt states she is going to call her PCP to schedule an appointment next week. Daughter here to pick up pt. Pt denies any pain and states she is ready for d/c.

## 2018-02-24 NOTE — Progress Notes (Signed)
Xarelto coupon card given to patient with explanation of usage; Aneta Mins [phone]

## 2018-02-24 NOTE — Progress Notes (Signed)
Pt's blood sugar was 59. Pt asymptomatic and given orange juice. Rechecked and blood sugar was 99.

## 2018-02-24 NOTE — Progress Notes (Addendum)
[facility] for heparin Indication: pulmonary embolus  Allergies  Allergen Reactions  . Naproxen     Patient Measurements: Ideal body weight: 50.1 kg Actual body weight: 81.5 kg  Heparin Dosing Weight: 68 kg   Vital Signs: Temp: 98.3 F (36.8 C) (03/09 0520) Temp Source: Oral (03/09 0520) BP: 163/98 (03/09 0520) Pulse Rate: 85 (03/09 0520)  Labs: Recent Labs    02/21/18 2020 02/22/18 0953 02/22/18 1235 02/22/18 1853  02/23/18 0246 02/23/18 0602 02/23/18 1329 02/24/18 0422  HGB 12.8 11.6*  --   --   --  10.8*  --   --  11.3*  HCT 39.0 35.1*  --   --   --  33.4*  --   --  34.8*  PLT 254 237  --   --   --  213  --   --  215  LABPROT  --   --   --   --   --  14.5  --   --  13.0  INR  --   --   --   --   --  1.14  --   --  0.99  HEPARINUNFRC  --  1.58*  --   --    < >  --  0.65 0.39 0.54  CREATININE 1.26*  --   --   --   --   --   --   --   --   TROPONINI 1.58*  --  2.06* 2.16*  --  1.80*  --   --   --    < > = values in this interval not displayed.    Estimated Creatinine Clearance: 41 mL/min (A) (by C-G formula based on SCr of 1.26 mg/dL (H)).   Medical History: Past Medical History:  Diagnosis Date  . Allergic rhinitis   . Allergic rhinitis   . Anxiety   . Arthritis    "hands, feet, shoulders, arms" (02/22/2018)  . Bilateral pulmonary embolism ([place]) 02/21/2018  . Chronic cough   . Depression   . Dupuytren contracture    "both feet, both hands" (02/22/2018)  . GERD (gastroesophageal reflux disease)   . Hypertension   . Hypothyroidism   . Mild persistent asthma     Medications:  Medications Prior to Admission  Medication Sig Dispense Refill Last Dose  . albuterol (PROAIR HFA) 108 (90 BASE) MCG/ACT inhaler Inhale 2 puffs into the lungs every 4 (four) hours as needed for wheezing or shortness of breath. 2 Inhaler prn 02/21/2018 at Unknown time  . albuterol (PROVENTIL) (2.5 MG/3ML) 0.083% nebulizer solution Take 3 mLs (2.5 mg  total) by nebulization every 6 (six) hours as needed for wheezing or shortness of breath. 1080 mL 3 02/21/2018 at Unknown time  . ALPRAZolam (XANAX) 0.25 MG tablet Take 1 tablet by mouth at bedtime as needed for anxiety.    02/21/2018 at Unknown time  . azelastine (ASTELIN) 0.1 % nasal spray Place into both nostrils 2 (two) times daily. Use in each nostril as directed   02/21/2018 at Unknown time  . celecoxib (CELEBREX) 200 MG capsule Take 200 mg by mouth daily.     02/21/2018 at Unknown time  . desvenlafaxine (PRISTIQ) 100 MG 24 hr tablet Take 100 mg by mouth daily.   02/20/2018  . dextromethorphan-guaiFENesin (MUCINEX DM) 30-600 MG 12hr tablet Take 1 tablet by mouth 2 (two) times daily. 14 tablet 1 02/21/2018 at Unknown time  . fluticasone (FLONASE) 50 MCG/ACT nasal spray  Place 2 sprays into both nostrils daily. 16 g 3 02/21/2018 at Unknown time  . Fluticasone-Salmeterol (ADVAIR DISKUS) 250-50 MCG/DOSE AEPB 1 puff and rinse well, twice daily 60 each 3 02/21/2018 at Unknown time  . latanoprost (XALATAN) 0.005 % ophthalmic solution Place 1 drop into both eyes at bedtime.   2 02/20/2018  . levocetirizine (XYZAL) 5 MG tablet Take 5 mg by mouth every evening.   02/20/2018  . levothyroxine (SYNTHROID, LEVOTHROID) 50 MCG tablet Take 1 tablet by mouth daily.   02/21/2018 at Unknown time  . losartan-hydrochlorothiazide (HYZAAR) 100-12.5 MG per tablet Take 1 tablet by mouth daily.     02/21/2018 at Unknown time  . Multiple Vitamin (MULTIVITAMIN) tablet Take 1 tablet by mouth daily.   02/21/2018 at Unknown time  . pantoprazole (PROTONIX) 40 MG tablet TK 1 T PO QD  0 02/21/2018 at Unknown time  . pregabalin (LYRICA) 75 MG capsule Take 75 mg by mouth 2 (two) times daily.   02/20/2018  . ranitidine (ZANTAC) 300 MG tablet    02/20/2018  . rOPINIRole (REQUIP) 2 MG tablet   1 02/20/2018  . Vitamin D, Ergocalciferol, (DRISDOL) 50000 units CAPS capsule Take 50,000 Units by mouth once a week.   02/18/2018  . albuterol (PROVENTIL HFA;VENTOLIN HFA) 108  (90 Base) MCG/ACT inhaler Inhale 1-2 puffs into the lungs every 6 (six) hours as needed for wheezing or shortness of breath. (Patient not taking: Reported on 02/22/2018) 1 Inhaler 1 Not Taking at Unknown time  . budesonide (PULMICORT) 0.25 MG/2ML nebulizer solution Take 2 mLs (0.25 mg total) by nebulization 2 (two) times daily. (Patient not taking: Reported on 02/22/2018) 180 mL 3 Not Taking at Unknown time  . HYDROcodone-acetaminophen (NORCO) 5-325 MG tablet Take 1 tablet by mouth every 6 (six) hours as needed. (Patient not taking: Reported on 02/22/2018) 20 tablet 0 Not Taking at Unknown time    Assessment: 3 YOF here with SOB found to have bilateral PE with RHS. Hg trend down to 10.8, Plt wnl. She is not on any anticoagulation prior to admission. Heparin level remains therapeutic. No bleed documented. Discussed both Xarelto and warfarin with patient and family per request.  Goal of Therapy:  Heparin level 0.3-0.7 units/ml Monitor platelets by anticoagulation protocol: Yes   Plan:  -Continue heparin at 750 units/hr -Monitor daily heparin level, CBC, and s/sx of bleeding  -F/u plans for long-term anticoagulation - patient and family educated on options  Addendum: - Xarelto ordered and administered to patient. Stop heparin gtt.   Leroy Libman, PharmD Pharmacy Resident Pager: [phone]

## 2018-02-24 NOTE — Progress Notes (Signed)
Living well with diabetes book given to pt.

## 2018-02-24 NOTE — Progress Notes (Signed)
Pt ambulated in the hall way 02 sat stayed above 96% on room air. Pt denied any SOB or pain during ambulation. Will cont to monitor pt.

## 2018-02-24 NOTE — Discharge Summary (Addendum)
Physician Discharge Summary  [name] CXK:[id number] DOB: 05-06-1946 DOA: 02/21/2018  PCP: Blair Heys, PA-C  Admit date: 02/21/2018 Discharge date: 02/24/2018  Admitted From: home Disposition:  home   Recommendations for Outpatient Follow-up:  1. F/u on new diagnosis of DM2 2. outpt hematology referral at discretion of PCP Discharge Condition:   stable CODE STATUS:  Full code   Consultations:  PCCM    Discharge Diagnoses:  Principal Problem:   Acute pulmonary embolism   Active Problems:   Type 2 diabetes mellitus with stage 3 chronic kidney disease    Elevated troponin   CKD (chronic kidney disease) stage 3, GFR 30-59 ml/min     Hypothyroidism   Seasonal and perennial allergic rhinitis   Asthma, mild intermittent   Esophageal reflux   Essential hypertension   Brief Summary: [name] 72 y.o.femalewith medical history significant ofhypothyroidism, mild intermittent asthma, HTN, GERD who presents for dyspnea at rest. Dyspnea has been present for weeks and was seen at urgent care on the morning of admission and treated with antibiotics and steroids. She subsequently went to the ER as her symptoms did not improve. CTA of the chest in the ER revealed a right and left main pulmonary artery  PEs.  Hospital Course:  Principal Problem:   Acute pulmonary embolism - she admits to being quite sedentary in the past 1-2 couple of month due to sinus/respiratory illnesses and a recent release of Dupuytren's contracture  - PCP can consider an outpt hematology eval for further work up - placed on Heparin infusion- pulmonary consulted to give opinion on TPA and did not feel it was needed - 2 D ECHO: mod pulm HTN (54), mod dilated RV, severely reduced RV  Function, mod TR, normal EF and grade 1 dCHF - Venous duplex LE: acute DVT in R gastrocnemius vien - pulse ox on room air 100% at rest and 96% on exertion - patient has elected to take a NOAC over Warfarin after  discussion with pharmacy and myslef-  Have started Xarelto for her today- discussed the plan with her daughter as well  Active Problems:  Elevated Troponin - likely due to cardiac strain from above - Troponin trend:  1.58  2.06  2.16  1.80     DM 2- new diagnosis - CBG 200-300s on admission- she did receive steroids at urgent care- sugars are now in the 100-200 range - HbA1c found to be 6.9 - have discussed with patient that she has mild diabetes- - as an initial treatment plan, we have decided on diet control with an attempt to lose weight- recommend checking another A1c in about 3-4 months- have consulted dietician and diabetes coordinator who have given her further education- outpt referral also made for continued education- glucometer prescribed  Stage 3 CKD - follow as outpt  Hypothyroidism - cont Synthroid    Asthma, mild intermittent/ postnasal drip - cont Astelin, Mucinex DM, Albuterol, Claritin, Dulera    Esophageal reflux - takes H2 blocker and PPI    Essential hypertension  - cont Hyzaar     DVT prophylaxis: Heparin infusion Code Status: Full code Family Communication:  Disposition Plan: home tomorrow Consultants:   PCCM Procedures:   2 D ECHO Study Conclusions  - Left ventricle: The cavity size was normal. There was mild focal basal hypertrophy of the septum. Systolic function was normal. The estimated ejection fraction was in the range of 60% to 65%. Wall motion was normal; there were no regional wall motion  abnormalities. Doppler parameters are consistent with abnormal left ventricular relaxation (grade 1 diastolic dysfunction). - Aortic valve: There was trivial regurgitation. - Right ventricle: The cavity size was moderately dilated. Systolic function was severely reduced. - Tricuspid valve: There was moderate regurgitation. - Pulmonary arteries: Systolic pressure was moderately increased. PA peak pressure: 54 mm Hg  (S).  Impressions:  - Normal LV systolic function; mild diastolic dysfunction; trace AI; moderate RVE with severe RV dysfunction; McConnell&'s sign noted suggestive of pulmonary embolus; moderate TR with moderate pulmonary hypertension.   LE venous duplex:  Preliminary notes by tech--Bilateral lower extremities venous exam completed. Right gastrocnemius vein dilated, non-compressible, which is evidence of acute vein thrombosis. Result notified RN Bryson Ha by phone.   Other findings:  Left popliteal fossa, there is a complex fluid collection noted, measuring 2.7x0.9x1.8cm.      Discharge Exam: Vitals:   02/24/18 0852 02/24/18 0933  BP:  (!) 147/74  Pulse:    Resp:    Temp:    SpO2: 99%    Vitals:   02/24/18 0520 02/24/18 0842 02/24/18 0852 02/24/18 0933  BP: (!) 163/98 (!) 147/74  (!) 147/74  Pulse: 85     Resp: 15     Temp: 98.3 F (36.8 C) 98.4 F (36.9 C)    TempSrc: Oral Oral    SpO2: 96% 100% 99%   Weight: 81.1 kg (178 lb 12.8 oz)     Height:        General: Pt is alert, awake, not in acute distress Cardiovascular: RRR, S1/S2 +, no rubs, no gallops Respiratory: CTA bilaterally, no wheezing, no rhonchi Abdominal: Soft, NT, ND, bowel sounds + Extremities: no edema, no cyanosis   Discharge Instructions  Discharge Instructions    Amb Referral to Nutrition and Diabetic E   Complete by:  As directed      Allergies as of 02/24/2018      Reactions   Naproxen       Medication List    STOP taking these medications   celecoxib 200 MG capsule Commonly known as:  CELEBREX     TAKE these medications   albuterol 108 (90 Base) MCG/ACT inhaler Commonly known as:  PROAIR HFA Inhale 2 puffs into the lungs every 4 (four) hours as needed for wheezing or shortness of breath. What changed:  Another medication with the same name was removed. Continue taking this medication, and follow the directions you see here.   albuterol (2.5 MG/3ML) 0.083% nebulizer  solution Commonly known as:  PROVENTIL Take 3 mLs (2.5 mg total) by nebulization every 6 (six) hours as needed for wheezing or shortness of breath. What changed:  Another medication with the same name was removed. Continue taking this medication, and follow the directions you see here.   ALPRAZolam 0.25 MG tablet Commonly known as:  XANAX Take 1 tablet by mouth at bedtime as needed for anxiety.   azelastine 0.1 % nasal spray Commonly known as:  ASTELIN Place into both nostrils 2 (two) times daily. Use in each nostril as directed   benzonatate 100 MG capsule Commonly known as:  TESSALON Take 1 capsule (100 mg total) by mouth 3 (three) times daily.   budesonide 0.25 MG/2ML nebulizer solution Commonly known as:  PULMICORT Take 2 mLs (0.25 mg total) by nebulization 2 (two) times daily.   desvenlafaxine 100 MG 24 hr tablet Commonly known as:  PRISTIQ Take 100 mg by mouth daily.   dextromethorphan-guaiFENesin 30-600 MG 12hr tablet Commonly known as:  MUCINEX  DM Take 1 tablet by mouth 2 (two) times daily.   fluticasone 50 MCG/ACT nasal spray Commonly known as:  FLONASE Place 2 sprays into both nostrils daily.   Fluticasone-Salmeterol 250-50 MCG/DOSE Aepb Commonly known as:  ADVAIR DISKUS 1 puff and rinse well, twice daily   glucose monitoring kit monitoring kit 1 each by Does not apply route as needed for other. Dispense any model that is covered- dispense testing supplies for Q AC/ HS accuchecks- 1 month supply with one refil.   latanoprost 0.005 % ophthalmic solution Commonly known as:  XALATAN Place 1 drop into both eyes at bedtime.   levothyroxine 50 MCG tablet Commonly known as:  SYNTHROID, LEVOTHROID Take 1 tablet by mouth daily.   losartan-hydrochlorothiazide 100-12.5 MG tablet Commonly known as:  HYZAAR Take 1 tablet by mouth daily.   multivitamin tablet Take 1 tablet by mouth daily.   pantoprazole 40 MG tablet Commonly known as:  PROTONIX TK 1 T PO QD    pregabalin 75 MG capsule Commonly known as:  LYRICA Take 75 mg by mouth 2 (two) times daily.   ranitidine 300 MG tablet Commonly known as:  ZANTAC   Rivaroxaban 15 & 20 MG Tbpk Take as directed on package: Start with one 3m tablet by mouth twice a day with food. On Day 22, switch to one 296mtablet once a day with food.   rOPINIRole 2 MG tablet Commonly known as:  REQUIP   Vitamin D (Ergocalciferol) 50000 units Caps capsule Commonly known as:  DRISDOL Take 50,000 Units by mouth once a week.   XYZAL 5 MG tablet Generic drug:  levocetirizine Take 5 mg by mouth every evening.       Allergies  Allergen Reactions  . Naproxen      Procedures/Studies: LE venous duplex:  Final Interpretation: Right: There is evidence of acute DVT in the Gastrocnemius vein. Left: There is no evidence of deep vein thrombosis in the lower extremity.There is no evidence of superficial venous thrombosis.  Dg Chest 2 View  Result Date: 02/21/2018 CLINICAL DATA:  Chest pain and shortness of breath.  Cough. EXAM: CHEST - 2 VIEW COMPARISON:  02/13/2018 and 11/20/2014 FINDINGS: The heart size and mediastinal contours are within normal limits. Both lungs are clear. Arthritic changes at the left shoulder. IMPRESSION: No active cardiopulmonary disease. Electronically Signed   By: JaLorriane Shire.D.   On: 02/21/2018 20:44   Dg Chest 2 View  Result Date: 02/13/2018 CLINICAL DATA:  Chest pain and cough. EXAM: CHEST  2 VIEW COMPARISON:  Chest x-ray dated November 20, 2014. FINDINGS: The heart size and mediastinal contours are within normal limits. Both lungs are clear. The visualized skeletal structures are unremarkable. IMPRESSION: No active cardiopulmonary disease. Electronically Signed   By: WiTitus Dubin.D.   On: 02/13/2018 16:00   Ct Angio Chest Pe W And/or Wo Contrast  Result Date: 02/21/2018 CLINICAL DATA:  Increased short of breath and chest pain EXAM: CT ANGIOGRAPHY CHEST WITH CONTRAST TECHNIQUE:  Multidetector CT imaging of the chest was performed using the standard protocol during bolus administration of intravenous contrast. Multiplanar CT image reconstructions and MIPs were obtained to evaluate the vascular anatomy. CONTRAST:  79 mL ISOVUE-370 IOPAMIDOL (ISOVUE-370) INJECTION 76% COMPARISON:  Chest x-ray 02/21/2018 FINDINGS: Cardiovascular: Satisfactory opacification of the pulmonary arteries to the segmental level. Thrombus present within the distal right and left pulmonary arteries with thrombus extending into the inter lobar arteries an segmental and subsegmental arteries of the upper, lower, and  right middle lobes. RV LV ratio is positive at 1.8. Reflux of contrast into the hepatic veins also consistent with right heart strain. Heart size within normal limits. No pericardial effusion. Mild aortic atherosclerosis. Coronary artery calcification. Mediastinum/Nodes: No enlarged mediastinal, hilar, or axillary lymph nodes. Thyroid gland, trachea, and esophagus demonstrate no significant findings. Lungs/Pleura: Lungs are clear. No pleural effusion or pneumothorax. Upper Abdomen: Cyst in the left hepatic lobe. No acute abnormality in the upper abdomen Musculoskeletal: No chest wall abnormality. No acute or significant osseous findings. Review of the MIP images confirms the above findings. IMPRESSION: 1. Findings consistent with acute bilateral pulmonary emboli involving the distal right and left main pulmonary arteries with extension of thrombus into the inter lobar arteries and segmental and subsegmental branches of all lobes of the lung. Positive for acute PE with CT evidence of right heart strain (RV/LV Ratio = 1.8) consistent with at least submassive (intermediate risk) PE. The presence of right heart strain has been associated with an increased risk of morbidity and mortality. Please activate Code PE by paging [phone]. 2. Clear lung fields Critical Value/emergent results were called by telephone at  the time of interpretation on 02/21/2018 at 9:52 pm to Dr. Gareth Morgan , who verbally acknowledged these results. Aortic Atherosclerosis (ICD10-I70.0). Electronically Signed   By: Donavan Foil M.D.   On: 02/21/2018 21:53     The results of significant diagnostics from this hospitalization (including imaging, microbiology, ancillary and laboratory) are listed below for reference.     Microbiology: Recent Results (from the past 240 hour(s))  MRSA PCR Screening     Status: None   Collection Time: 02/22/18 10:00 AM  Result Value Ref Range Status   MRSA by PCR NEGATIVE NEGATIVE Final    Comment:        The GeneXpert MRSA Assay (FDA approved for NASAL specimens only), is one component of a comprehensive MRSA colonization surveillance program. It is not intended to diagnose MRSA infection nor to guide or monitor treatment for MRSA infections. Performed at [facility], [place] [address], [place], [place] [address]      Labs: BNP (last 3 results) Recent Labs    02/21/18 2020  BNP 24.2   Basic Metabolic Panel: Recent Labs  Lab 02/21/18 2020  NA 136  K 4.4  CL 104  CO2 18*  GLUCOSE 313*  BUN 23*  CREATININE 1.26*  CALCIUM 9.3   Liver Function Tests: Recent Labs  Lab 02/21/18 2020  AST 28  ALT 23  ALKPHOS 94  BILITOT 0.5  PROT 6.8  ALBUMIN 3.8   No results for input(s): LIPASE, AMYLASE in the last 168 hours. No results for input(s): AMMONIA in the last 168 hours. CBC: Recent Labs  Lab 02/21/18 2020 02/22/18 0953 02/23/18 0246 02/24/18 0422  WBC 17.9* 13.7* 14.5* 12.4*  NEUTROABS 10.6  --   --   --   HGB 12.8 11.6* 10.8* 11.3*  HCT 39.0 35.1* 33.4* 34.8*  MCV 86.5 86.5 88.8 88.3  PLT 254 237 213 215   Cardiac Enzymes: Recent Labs  Lab 02/21/18 2020 02/22/18 1235 02/22/18 1853 02/23/18 0246  TROPONINI 1.58* 2.06* 2.16* 1.80*   BNP: Invalid input(s): POCBNP CBG: Recent Labs  Lab 02/23/18 0746 02/23/18 1213 02/23/18 1629 02/23/18 2009  02/24/18 0738  GLUCAP 97 120* 162* 59* 96   D-Dimer No results for input(s): DDIMER in the last 72 hours. Hgb A1c Recent Labs    02/22/18 1235  HGBA1C 6.9*  Lipid Profile No results for input(s): CHOL, HDL, LDLCALC, TRIG, CHOLHDL, LDLDIRECT in the last 72 hours. Thyroid function studies Recent Labs    02/21/18 2020  TSH 1.162   Anemia work up No results for input(s): VITAMINB12, FOLATE, FERRITIN, TIBC, IRON, RETICCTPCT in the last 72 hours. Urinalysis    Component Value Date/Time   COLORURINE AMBER (A) 02/13/2018 1739   APPEARANCEUR HAZY (A) 02/13/2018 1739   LABSPEC 1.032 (H) 02/13/2018 1739   PHURINE 5.0 02/13/2018 1739   GLUCOSEU NEGATIVE 02/13/2018 1739   HGBUR NEGATIVE 02/13/2018 1739   BILIRUBINUR NEGATIVE 02/13/2018 1739   KETONESUR 5 (A) 02/13/2018 1739   PROTEINUR NEGATIVE 02/13/2018 1739   NITRITE NEGATIVE 02/13/2018 1739   LEUKOCYTESUR MODERATE (A) 02/13/2018 1739   Sepsis Labs Invalid input(s): PROCALCITONIN,  WBC,  LACTICIDVEN Microbiology Recent Results (from the past 240 hour(s))  MRSA PCR Screening     Status: None   Collection Time: 02/22/18 10:00 AM  Result Value Ref Range Status   MRSA by PCR NEGATIVE NEGATIVE Final    Comment:        The GeneXpert MRSA Assay (FDA approved for NASAL specimens only), is one component of a comprehensive MRSA colonization surveillance program. It is not intended to diagnose MRSA infection nor to guide or monitor treatment for MRSA infections. Performed at [facility], [place] [address], [place], [place] [address]      Time coordinating discharge: Over 30 minutes  SIGNED:   Debbe Odea, MD  [facility] 02/24/2018, 12:51 PM Pager   If 7PM-7AM, please contact night-coverage [email or web address] Password [email or web address]

## 2018-02-26 LAB — GLUCOSE, CAPILLARY
GLUCOSE-CAPILLARY: 108 mg/dL — AB (ref 65–99)
Glucose-Capillary: 99 mg/dL (ref 65–99)
Glucose-Capillary: 135 mg/dL — ABNORMAL HIGH (ref 65–99)

## 2018-03-01 ENCOUNTER — Ambulatory Visit (INDEPENDENT_AMBULATORY_CARE_PROVIDER_SITE_OTHER): Payer: Medicare Other | Admitting: Adult Health

## 2018-03-01 ENCOUNTER — Encounter: Payer: Self-pay | Admitting: Adult Health

## 2018-03-01 ENCOUNTER — Inpatient Hospital Stay: Payer: Self-pay | Admitting: Internal Medicine

## 2018-03-01 DIAGNOSIS — J4520 Mild intermittent asthma, uncomplicated: Secondary | ICD-10-CM | POA: Diagnosis not present

## 2018-03-01 DIAGNOSIS — I2609 Other pulmonary embolism with acute cor pulmonale: Secondary | ICD-10-CM

## 2018-03-01 MED ORDER — BUDESONIDE 0.25 MG/2ML IN SUSP: 0.2500 mg | Freq: Two times a day (BID) | RESPIRATORY_TRACT | 5 refills | Status: DC

## 2018-03-01 NOTE — Assessment & Plan Note (Signed)
Recently diagnosed bilateral PE with right heart strain and DVT on the right leg Improving on Xarelto Patient is continue on Xarelto starter pack and transition on day 21 - Xarelto 20 mg daily as directed.  Prescriptions have been sent by her primary care physician per patient Report  any signs of bleeding Follow-up with hematology and cardiology as planned  Plan  Patient Instructions  Continue on Xarelto, transition as directed Report any signs of bleeding Follow-up with hematology and cardiology as planned follow up Dr. Annamaria Boots  In 3 months and As needed

## 2018-03-01 NOTE — Addendum Note (Signed)
Addended by: Parke Poisson E on: 03/01/2018 05:26 PM   Modules accepted: Orders

## 2018-03-01 NOTE — Assessment & Plan Note (Signed)
Continue on current regimen .   

## 2018-03-01 NOTE — Patient Instructions (Signed)
Continue on Xarelto, transition as directed Report any signs of bleeding Follow-up with hematology and cardiology as planned follow up Dr. Annamaria Boots  In 3 months and As needed

## 2018-03-01 NOTE — Progress Notes (Signed)
'@Patient'  ID: [name], female    DOB: 06/26/1946, 72 y.o.   MRN: [id number]  Chief Complaint  Patient presents with  . Acute Visit    PE     Referring provider: Elayne Guerin  HPI: 72 year old female never smoker followed for asthma, allergic rhinitis  TEST  CTA chest 3/7 > b/l PE involving distal right and left main pulmonary arteries, RV / LV 1.8, clear lung fields. Echo 3/7 >  EF 60 - 65%, G1DD, PAP 54.    03/01/2018 Post Hospital follow up : PE /DVT  Patient returns for a post hospital follow-up.  Patient was admitted last week for an acute PE and right DVT.  She was treated with heparin and discharged on Xarelto. Echo showed  right heart strain.  2D echo showed moderate pulmonary hypertension with PA pressures at 54 mmHg.,  Moderate dilated RV, severely reduced RV function and grade 1 diastolic dysfunction patient is seen her primary care physician yesterday.  Been referred to cardiology for evaluation.  She is also been referred to hematology for evaluation. Prior to admission patient had had outpatient surgery 1 month prior for Dupuytren contracture release and has been fairly sedentary since that. Patient says since discharge she is starting to feel better.  She does get winded with activities.  Her activity level is starting to improve.  She denies any hemoptysis chest pain orthopnea or edema.  Allergies  Allergen Reactions  . Naproxen     Immunization History  Administered Date(s) Administered  . Influenza Split 10/06/2011, 10/30/2012, 09/18/2013, 09/18/2014, 09/16/2015, 10/19/2017  . Influenza Whole 11/18/2010  . Influenza, High Dose Seasonal PF 09/21/2016  . Pneumococcal Conjugate-13 11/20/2013  . Pneumococcal Polysaccharide-23 12/20/2007    Past Medical History:  Diagnosis Date  . Allergic rhinitis   . Allergic rhinitis   . Anxiety   . Arthritis    "hands, feet, shoulders, arms" (02/22/2018)  . Bilateral pulmonary embolism ([place]) 02/21/2018  .  Chronic cough   . Depression   . Dupuytren contracture    "both feet, both hands" (02/22/2018)  . GERD (gastroesophageal reflux disease)   . Hypertension   . Hypothyroidism   . Mild persistent asthma     Tobacco History: Social History   Tobacco Use  Smoking Status Never Smoker  Smokeless Tobacco Never Used   Counseling given: Not Answered   Outpatient Encounter Medications as of 03/01/2018  Medication Sig  . albuterol (PROAIR HFA) 108 (90 BASE) MCG/ACT inhaler Inhale 2 puffs into the lungs every 4 (four) hours as needed for wheezing or shortness of breath.  Marland Kitchen albuterol (PROVENTIL) (2.5 MG/3ML) 0.083% nebulizer solution Take 3 mLs (2.5 mg total) by nebulization every 6 (six) hours as needed for wheezing or shortness of breath.  . ALPRAZolam (XANAX) 0.25 MG tablet Take 1 tablet by mouth at bedtime as needed for anxiety.   Marland Kitchen azelastine (ASTELIN) 0.1 % nasal spray Place into both nostrils 2 (two) times daily. Use in each nostril as directed  . budesonide (PULMICORT) 0.25 MG/2ML nebulizer solution Take 2 mLs (0.25 mg total) by nebulization 2 (two) times daily.  . celecoxib (CELEBREX) 200 MG capsule Take 200 mg by mouth daily.  Marland Kitchen desvenlafaxine (PRISTIQ) 100 MG 24 hr tablet Take 100 mg by mouth daily.  Marland Kitchen dextromethorphan-guaiFENesin (MUCINEX DM) 30-600 MG 12hr tablet Take 1 tablet by mouth 2 (two) times daily.  . fluticasone (FLONASE) 50 MCG/ACT nasal spray Place 2 sprays into both nostrils daily.  . Fluticasone-Salmeterol (ADVAIR  DISKUS) 250-50 MCG/DOSE AEPB 1 puff and rinse well, twice daily (Patient taking differently: 2 puffs. 1 puff and rinse well, twice daily)  . glucose monitoring kit (FREESTYLE) monitoring kit 1 each by Does not apply route as needed for other. Dispense any model that is covered- dispense testing supplies for Q AC/ HS accuchecks- 1 month supply with one refil.  Marland Kitchen latanoprost (XALATAN) 0.005 % ophthalmic solution Place 1 drop into both eyes at bedtime.   Marland Kitchen  levocetirizine (XYZAL) 5 MG tablet Take 5 mg by mouth every evening.  Marland Kitchen levothyroxine (SYNTHROID, LEVOTHROID) 50 MCG tablet Take 1 tablet by mouth daily.  Marland Kitchen losartan-hydrochlorothiazide (HYZAAR) 100-12.5 MG per tablet Take 1 tablet by mouth daily.    . Multiple Vitamin (MULTIVITAMIN) tablet Take 1 tablet by mouth daily.  . pantoprazole (PROTONIX) 40 MG tablet TK 1 T PO QD  . pregabalin (LYRICA) 75 MG capsule Take 75 mg by mouth 2 (two) times daily.  . ranitidine (ZANTAC) 300 MG tablet   . Rivaroxaban 15 & 20 MG TBPK Take as directed on package: Start with one 71m tablet by mouth twice a day with food. On Day 22, switch to one 242mtablet once a day with food.  . Marland KitchenOPINIRole (REQUIP) 2 MG tablet   . venlafaxine (EFFEXOR) 75 MG tablet Take 75 mg by mouth daily.  . Vitamin D, Ergocalciferol, (DRISDOL) 50000 units CAPS capsule Take 50,000 Units by mouth once a week.  . benzonatate (TESSALON) 100 MG capsule Take 1 capsule (100 mg total) by mouth 3 (three) times daily as needed for cough. (Patient not taking: Reported on 03/01/2018)   No facility-administered encounter medications on file as of 03/01/2018.      Review of Systems  Constitutional:   No  weight loss, night sweats,  Fevers, chills, fatigue, or  lassitude.  HEENT:   No headaches,  Difficulty swallowing,  Tooth/dental problems, or  Sore throat,                No sneezing, itching, ear ache, nasal congestion, post nasal drip,   CV:  No chest pain,  Orthopnea, PND, swelling in lower extremities, anasarca, dizziness, palpitations, syncope.   GI  No heartburn, indigestion, abdominal pain, nausea, vomiting, diarrhea, change in bowel habits, loss of appetite, bloody stools.   Resp: No shortness of breath with exertion or at rest.  No excess mucus, no productive cough,  No non-productive cough,  No coughing up of blood.  No change in color of mucus.  No wheezing.  No chest wall deformity  Skin: no rash or lesions.  GU: no dysuria, change  in color of urine, no urgency or frequency.  No flank pain, no hematuria   MS:  No joint pain or swelling.  No decreased range of motion.  No back pain.    Physical Exam  BP 120/82 (BP Location: Left Arm, Cuff Size: Normal)   Pulse 79   Temp 97.7 F (36.5 C) (Oral)   Ht '5\' 1"'  (1.549 m)   Wt 176 lb 12.8 oz (80.2 kg)   SpO2 97%   BMI 33.41 kg/m   GEN: A/Ox3; pleasant , NAD, well nourished    HEENT:  [place]/AT,  EACs-clear, TMs-wnl, NOSE-clear, THROAT-clear, no lesions, no postnasal drip or exudate noted.   NECK:  Supple w/ fair ROM; no JVD; normal carotid impulses w/o bruits; no thyromegaly or nodules palpated; no lymphadenopathy.    RESP  Clear  P & A; w/o, wheezes/ rales/ or rhonchi.  no accessory muscle use, no dullness to percussion  CARD:  RRR, no m/r/g, no peripheral edema, pulses intact, no cyanosis or clubbing.  GI:   Soft & nt; nml bowel sounds; no organomegaly or masses detected.   Musco: Warm bil, no deformities or joint swelling noted.  Arthritic changes in the hands  Neuro: alert, no focal deficits noted.    Skin: Warm, no lesions or rashes    Lab Results:  CBC    Component Value Date/Time   WBC 12.4 (H) 02/24/2018 0422   RBC 3.94 02/24/2018 0422   HGB 11.3 (L) 02/24/2018 0422   HCT 34.8 (L) 02/24/2018 0422   PLT 215 02/24/2018 0422   MCV 88.3 02/24/2018 0422   MCH 28.7 02/24/2018 0422   MCHC 32.5 02/24/2018 0422   RDW 16.5 (H) 02/24/2018 0422   LYMPHSABS 6.7 02/21/2018 2020   MONOABS 0.6 02/21/2018 2020   EOSABS 0.1 02/21/2018 2020   BASOSABS 0.0 02/21/2018 2020    BMET    Component Value Date/Time   NA 136 02/21/2018 2020   NA 141 11/03/2015 1117   K 4.4 02/21/2018 2020   CL 104 02/21/2018 2020   CO2 18 (L) 02/21/2018 2020   GLUCOSE 313 (H) 02/21/2018 2020   BUN 23 (H) 02/21/2018 2020   BUN 22 11/03/2015 1117   CREATININE 1.26 (H) 02/21/2018 2020   CALCIUM 9.3 02/21/2018 2020   GFRNONAA 42 (L) 02/21/2018 2020   GFRAA 49 (L) 02/21/2018  2020    BNP    Component Value Date/Time   BNP 78.4 02/21/2018 2020    ProBNP No results found for: PROBNP  Imaging: Dg Chest 2 View  Result Date: 02/21/2018 CLINICAL DATA:  Chest pain and shortness of breath.  Cough. EXAM: CHEST - 2 VIEW COMPARISON:  02/13/2018 and 11/20/2014 FINDINGS: The heart size and mediastinal contours are within normal limits. Both lungs are clear. Arthritic changes at the left shoulder. IMPRESSION: No active cardiopulmonary disease. Electronically Signed   By: Lorriane Shire M.D.   On: 02/21/2018 20:44   Dg Chest 2 View  Result Date: 02/13/2018 CLINICAL DATA:  Chest pain and cough. EXAM: CHEST  2 VIEW COMPARISON:  Chest x-ray dated November 20, 2014. FINDINGS: The heart size and mediastinal contours are within normal limits. Both lungs are clear. The visualized skeletal structures are unremarkable. IMPRESSION: No active cardiopulmonary disease. Electronically Signed   By: Titus Dubin M.D.   On: 02/13/2018 16:00   Ct Angio Chest Pe W And/or Wo Contrast  Result Date: 02/21/2018 CLINICAL DATA:  Increased short of breath and chest pain EXAM: CT ANGIOGRAPHY CHEST WITH CONTRAST TECHNIQUE: Multidetector CT imaging of the chest was performed using the standard protocol during bolus administration of intravenous contrast. Multiplanar CT image reconstructions and MIPs were obtained to evaluate the vascular anatomy. CONTRAST:  79 mL ISOVUE-370 IOPAMIDOL (ISOVUE-370) INJECTION 76% COMPARISON:  Chest x-ray 02/21/2018 FINDINGS: Cardiovascular: Satisfactory opacification of the pulmonary arteries to the segmental level. Thrombus present within the distal right and left pulmonary arteries with thrombus extending into the inter lobar arteries an segmental and subsegmental arteries of the upper, lower, and right middle lobes. RV LV ratio is positive at 1.8. Reflux of contrast into the hepatic veins also consistent with right heart strain. Heart size within normal limits. No  pericardial effusion. Mild aortic atherosclerosis. Coronary artery calcification. Mediastinum/Nodes: No enlarged mediastinal, hilar, or axillary lymph nodes. Thyroid gland, trachea, and esophagus demonstrate no significant findings. Lungs/Pleura: Lungs are clear. No pleural effusion  or pneumothorax. Upper Abdomen: Cyst in the left hepatic lobe. No acute abnormality in the upper abdomen Musculoskeletal: No chest wall abnormality. No acute or significant osseous findings. Review of the MIP images confirms the above findings. IMPRESSION: 1. Findings consistent with acute bilateral pulmonary emboli involving the distal right and left main pulmonary arteries with extension of thrombus into the inter lobar arteries and segmental and subsegmental branches of all lobes of the lung. Positive for acute PE with CT evidence of right heart strain (RV/LV Ratio = 1.8) consistent with at least submassive (intermediate risk) PE. The presence of right heart strain has been associated with an increased risk of morbidity and mortality. Please activate Code PE by paging [phone]. 2. Clear lung fields Critical Value/emergent results were called by telephone at the time of interpretation on 02/21/2018 at 9:52 pm to Dr. Gareth Morgan , who verbally acknowledged these results. Aortic Atherosclerosis (ICD10-I70.0). Electronically Signed   By: Donavan Foil M.D.   On: 02/21/2018 21:53     Assessment & Plan:   Asthma, mild intermittent Continue on current regimen  Acute pulmonary embolism (HCC) Recently diagnosed bilateral PE with right heart strain and DVT on the right leg Improving on Xarelto Patient is continue on Xarelto starter pack and transition on day 21 - Xarelto 20 mg daily as directed.  Prescriptions have been sent by her primary care physician per patient Report  any signs of bleeding Follow-up with hematology and cardiology as planned  Plan  Patient Instructions  Continue on Xarelto, transition as  directed Report any signs of bleeding Follow-up with hematology and cardiology as planned follow up Dr. Annamaria Boots  In 3 months and As needed            Rexene Edison, NP 03/01/2018

## 2018-03-02 ENCOUNTER — Telehealth: Payer: Self-pay | Admitting: Adult Health

## 2018-03-02 ENCOUNTER — Encounter: Payer: Self-pay | Admitting: Hematology & Oncology

## 2018-03-02 NOTE — Telephone Encounter (Signed)
Received a PA request for patient's budesonide 0.25mg . PA started via [email or web address]. Key is [id number]. Determination may take up to 3 business days.   Will route to [facility] for follow up.

## 2018-03-05 NOTE — Telephone Encounter (Signed)
Received a message from [email or web address] stating that the medication needs to be filled under Part B and can not be charged under Part D.   Spoke with Manuela Schwartz at [facility]. She stated that they do not file under Part B, RX will need to be sent to another pharmacy. Manuela Schwartz stated that she would contact the patient to let her know.

## 2018-03-23 ENCOUNTER — Other Ambulatory Visit: Payer: Self-pay

## 2018-03-23 ENCOUNTER — Inpatient Hospital Stay: Payer: Medicare Other | Attending: Hematology & Oncology | Admitting: Hematology & Oncology

## 2018-03-23 ENCOUNTER — Encounter: Payer: Self-pay | Admitting: Hematology & Oncology

## 2018-03-23 ENCOUNTER — Inpatient Hospital Stay: Payer: Medicare Other

## 2018-03-23 VITALS — BP 135/55 | HR 75 | Temp 97.6°F | Resp 18 | Wt 175.0 lb

## 2018-03-23 DIAGNOSIS — I2609 Other pulmonary embolism with acute cor pulmonale: Secondary | ICD-10-CM

## 2018-03-23 DIAGNOSIS — Z7901 Long term (current) use of anticoagulants: Secondary | ICD-10-CM | POA: Diagnosis not present

## 2018-03-23 DIAGNOSIS — Z800 Family history of malignant neoplasm of digestive organs: Secondary | ICD-10-CM | POA: Diagnosis not present

## 2018-03-23 DIAGNOSIS — M1990 Unspecified osteoarthritis, unspecified site: Secondary | ICD-10-CM | POA: Insufficient documentation

## 2018-03-23 DIAGNOSIS — Z86711 Personal history of pulmonary embolism: Secondary | ICD-10-CM | POA: Diagnosis present

## 2018-03-23 DIAGNOSIS — F419 Anxiety disorder, unspecified: Secondary | ICD-10-CM | POA: Diagnosis not present

## 2018-03-23 DIAGNOSIS — Z79899 Other long term (current) drug therapy: Secondary | ICD-10-CM | POA: Diagnosis not present

## 2018-03-23 DIAGNOSIS — J4530 Mild persistent asthma, uncomplicated: Secondary | ICD-10-CM | POA: Diagnosis not present

## 2018-03-23 DIAGNOSIS — R0789 Other chest pain: Secondary | ICD-10-CM | POA: Diagnosis not present

## 2018-03-23 LAB — CBC WITH DIFFERENTIAL (CANCER CENTER ONLY)
Basophils Absolute: 0.1 10*3/uL (ref 0.0–0.1)
Basophils Relative: 1 %
Eosinophils Absolute: 0.2 10*3/uL (ref 0.0–0.5)
Eosinophils Relative: 2 %
HEMATOCRIT: 36.7 % (ref 34.8–46.6)
HEMOGLOBIN: 12 g/dL (ref 11.6–15.9)
LYMPHS ABS: 4.5 10*3/uL — AB (ref 0.9–3.3)
LYMPHS PCT: 47 %
MCH: 29.3 pg (ref 26.0–34.0)
MCHC: 32.7 g/dL (ref 32.0–36.0)
MCV: 89.7 fL (ref 81.0–101.0)
MONOS PCT: 8 %
Monocytes Absolute: 0.7 10*3/uL (ref 0.1–0.9)
NEUTROS PCT: 42 %
Neutro Abs: 4 10*3/uL (ref 1.5–6.5)
Platelet Count: 363 10*3/uL (ref 145–400)
RBC: 4.09 MIL/uL (ref 3.70–5.32)
RDW: 15.6 % (ref 11.1–15.7)
WBC Count: 9.4 10*3/uL (ref 3.9–10.0)

## 2018-03-23 LAB — CMP (CANCER CENTER ONLY)
ALBUMIN: 3.9 g/dL (ref 3.5–5.0)
ALT: 22 U/L (ref 0–55)
ANION GAP: 11 (ref 3–11)
AST: 23 U/L (ref 5–34)
Alkaline Phosphatase: 87 U/L (ref 40–150)
BILIRUBIN TOTAL: 0.3 mg/dL (ref 0.2–1.2)
BUN: 23 mg/dL (ref 7–26)
CO2: 24 mmol/L (ref 22–29)
Calcium: 9.8 mg/dL (ref 8.4–10.4)
Chloride: 105 mmol/L (ref 98–109)
Creatinine: 1.02 mg/dL (ref 0.60–1.10)
GFR, Estimated: 54 mL/min — ABNORMAL LOW (ref >60)
GLUCOSE: 104 mg/dL (ref 70–140)
Potassium: 3.8 mmol/L (ref 3.5–5.1)
Sodium: 140 mmol/L (ref 136–145)
Total Protein: 6.9 g/dL (ref 6.4–8.3)

## 2018-03-23 LAB — D-DIMER, QUANTITATIVE: D-Dimer, Quant: 0.95 ug/mL-FEU — ABNORMAL HIGH (ref 0.00–0.50)

## 2018-03-23 MED ORDER — RIVAROXABAN 20 MG PO TABS: 20.0000 mg | ORAL_TABLET | Freq: Every day | ORAL | 12 refills | Status: DC

## 2018-03-23 NOTE — Progress Notes (Signed)
Referral MD  Reason for Referral: Severe pulmonary embolism with right heart strain and right lower leg thrombus  No chief complaint on file. : I had a blood clot my lung and leg.  HPI: [name] is a very nice 72 year old white female.  She actually is the mother-in-law of 1 of my patients.  She has horrible osteoarthritis.  She has had surgery for this.  Of note, last year, she had bilateral breast reduction.  Ever since she had that surgery, she really was not that active.  She had some viral syndrome around Christmas.  This further decrease her level of activity.  Starting in February, she began to have some episodes of shortness of breath.  She has some chest wall pain.  She had seen urgent care centers.  She has a history of asthma.  She says that she coughs.  She has had no hemoptysis.  Finally, got to the point where she was having a hard time breathing.  Thankfully, her daughter was home to see her.  They immediately took her to the emergency room at [facility].  She had a CT angiogram.  This was done on February 21, 2018.  This showed acute bilateral pulmonary emboli involving the distal right and left main pulmonary arteries.  She had extensive involvement of her lobar and segmental arteries.  She had right heart strain.  She was admitted to the hospital.  She was started on heparin.  She then had a Doppler of her legs.  She never had leg swelling.  The Doppler showed a thrombus in the gastrocnemius vein.  She was transitioned over to Xarelto.  She is feeling a little bit better.  She is never smoked.  She has had some secondhand smoke exposure.  She does not take postmenopausal estrogens.  There is been no recent travel prior to her having these thromboembolic episodes.  She was kindly referred to the [facility] center for an evaluation.  She has had no rashes.  She has had no bleeding.  She is diligent with her mammograms.  She says she gets a  colonoscopy every year.  She says her mother had colon cancer.  She also said that that her mother had blood clots and was on Coumadin.  Currently, her performance status is ECOG 1.   Past Medical History:  Diagnosis Date  . Allergic rhinitis   . Allergic rhinitis   . Anxiety   . Arthritis    "hands, feet, shoulders, arms" (02/22/2018)  . Bilateral pulmonary embolism ([place]) 02/21/2018  . Chronic cough   . Depression   . Dupuytren contracture    "both feet, both hands" (02/22/2018)  . GERD (gastroesophageal reflux disease)   . Hypertension   . Hypothyroidism   . Mild persistent asthma   :  Past Surgical History:  Procedure Laterality Date  . BLADDER SUSPENSION  X 2  . BUNIONECTOMY     RIGHT FOOT ARCH REPAIRED AS WELL  . CATARACT EXTRACTION W/ INTRAOCULAR LENS  IMPLANT, BILATERAL Bilateral   . DILATION AND CURETTAGE OF UTERUS  X 3  . DUPUYTREN CONTRACTURE RELEASE Bilateral    "feet"  . FASCIECTOMY Right 01/23/2018   Procedure: SEGMENTAL FASCIECTOMY RIGHT MIDDLE FINGER;  Surgeon: Daryll Brod, MD;  Location: [facility];  Service: Orthopedics;  Laterality: Right;  AXILLARY BLOCK  . KNEE ARTHROSCOPY Right   . REDUCTION MAMMAPLASTY Bilateral   . TOTAL ABDOMINAL HYSTERECTOMY  1975   "got pregnant w/IUD  in then lost baby"  :   Current Outpatient Medications:  .  albuterol (PROAIR HFA) 108 (90 BASE) MCG/ACT inhaler, Inhale 2 puffs into the lungs every 4 (four) hours as needed for wheezing or shortness of breath., Disp: 2 Inhaler, Rfl: prn .  albuterol (PROVENTIL) (2.5 MG/3ML) 0.083% nebulizer solution, Take 3 mLs (2.5 mg total) by nebulization every 6 (six) hours as needed for wheezing or shortness of breath., Disp: 1080 mL, Rfl: 3 .  ALPRAZolam (XANAX) 0.25 MG tablet, Take 1 tablet by mouth at bedtime as needed for anxiety. , Disp: , Rfl:  .  azelastine (ASTELIN) 0.1 % nasal spray, Place into both nostrils 2 (two) times daily. Use in each nostril as directed, Disp: , Rfl:   .  benzonatate (TESSALON) 100 MG capsule, Take 1 capsule (100 mg total) by mouth 3 (three) times daily as needed for cough. (Patient not taking: Reported on 03/01/2018), Disp: 30 capsule, Rfl: 0 .  budesonide (PULMICORT) 0.25 MG/2ML nebulizer solution, Take 2 mLs (0.25 mg total) by nebulization 2 (two) times daily. Dx: J45.20, Disp: 120 mL, Rfl: 5 .  celecoxib (CELEBREX) 200 MG capsule, Take 200 mg by mouth daily., Disp: , Rfl:  .  desvenlafaxine (PRISTIQ) 100 MG 24 hr tablet, Take 100 mg by mouth daily., Disp: , Rfl:  .  dextromethorphan-guaiFENesin (MUCINEX DM) 30-600 MG 12hr tablet, Take 1 tablet by mouth 2 (two) times daily., Disp: 14 tablet, Rfl: 1 .  fluticasone (FLONASE) 50 MCG/ACT nasal spray, Place 2 sprays into both nostrils daily., Disp: 16 g, Rfl: 3 .  Fluticasone-Salmeterol (ADVAIR DISKUS) 250-50 MCG/DOSE AEPB, 1 puff and rinse well, twice daily (Patient taking differently: 2 puffs. 1 puff and rinse well, twice daily), Disp: 60 each, Rfl: 3 .  glucose monitoring kit (FREESTYLE) monitoring kit, 1 each by Does not apply route as needed for other. Dispense any model that is covered- dispense testing supplies for Q AC/ HS accuchecks- 1 month supply with one refil., Disp: 1 each, Rfl: 1 .  latanoprost (XALATAN) 0.005 % ophthalmic solution, Place 1 drop into both eyes at bedtime. , Disp: , Rfl: 2 .  levocetirizine (XYZAL) 5 MG tablet, Take 5 mg by mouth every evening., Disp: , Rfl:  .  levothyroxine (SYNTHROID, LEVOTHROID) 50 MCG tablet, Take 1 tablet by mouth daily., Disp: , Rfl:  .  losartan-hydrochlorothiazide (HYZAAR) 100-12.5 MG per tablet, Take 1 tablet by mouth daily.  , Disp: , Rfl:  .  Multiple Vitamin (MULTIVITAMIN) tablet, Take 1 tablet by mouth daily., Disp: , Rfl:  .  pantoprazole (PROTONIX) 40 MG tablet, TK 1 T PO QD, Disp: , Rfl: 0 .  pregabalin (LYRICA) 75 MG capsule, Take 75 mg by mouth 2 (two) times daily., Disp: , Rfl:  .  ranitidine (ZANTAC) 300 MG tablet, , Disp: , Rfl:   .  rivaroxaban (XARELTO) 20 MG TABS tablet, Take 1 tablet (20 mg total) by mouth daily with supper., Disp: 30 tablet, Rfl: 12 .  Rivaroxaban 15 & 20 MG TBPK, Take as directed on package: Start with one 38m tablet by mouth twice a day with food. On Day 22, switch to one 233mtablet once a day with food., Disp: 51 each, Rfl: 0 .  rOPINIRole (REQUIP) 2 MG tablet, , Disp: , Rfl: 1 .  venlafaxine (EFFEXOR) 75 MG tablet, Take 75 mg by mouth daily., Disp: , Rfl:  .  Vitamin D, Ergocalciferol, (DRISDOL) 50000 units CAPS capsule, Take 50,000 Units by mouth once a  week., Disp: , Rfl: :  :  Allergies  Allergen Reactions  . Naproxen   :  Family History  Problem Relation Age of Onset  . Pneumonia Father   . Arthritis Mother   :  Social History   Socioeconomic History  . Marital status: Widowed    Spouse name: Not on file  . Number of children: 2  . Years of education: 41  . Highest education level: Not on file  Occupational History  . Occupation: Retired  Scientific laboratory technician  . Financial resource strain: Not on file  . Food insecurity:    Worry: Not on file    Inability: Not on file  . Transportation needs:    Medical: Not on file    Non-medical: Not on file  Tobacco Use  . Smoking status: Never Smoker  . Smokeless tobacco: Never Used  Substance and Sexual Activity  . Alcohol use: Yes    Comment: 02/22/2018 "might have a drink q couple years"  . Drug use: No  . Sexual activity: Never  Lifestyle  . Physical activity:    Days per week: Not on file    Minutes per session: Not on file  . Stress: Not on file  Relationships  . Social connections:    Talks on phone: Not on file    Gets together: Not on file    Attends religious service: Not on file    Active member of club or organization: Not on file    Attends meetings of clubs or organizations: Not on file    Relationship status: Not on file  . Intimate partner violence:    Fear of current or ex partner: Not on file     Emotionally abused: Not on file    Physically abused: Not on file    Forced sexual activity: Not on file  Other Topics Concern  . Not on file  Social History Narrative   Lives at home with her brother.   Right-handed.   3-4 cups coffee per day.  :  Review of Systems  Constitutional: Negative.   HENT: Negative.   Eyes: Negative.   Respiratory: Positive for cough.   Cardiovascular: Positive for chest pain.  Gastrointestinal: Negative.   Genitourinary: Negative.   Musculoskeletal: Positive for joint pain and myalgias.  Skin: Negative.   Neurological: Negative.   Endo/Heme/Allergies: Negative.   Psychiatric/Behavioral: Negative.      Exam: Is this fairly well-developed and well-nourished white female in no obvious distress.  Vital signs show a temperature of 97.6.  Pulse 75.  Blood pressure 135/55.  Weight is 175 pounds.  Head neck exam shows no ocular or oral lesions.  She has no palpable cervical or supra clavicular lymph nodes.  Lungs are clear bilaterally.  No rubs are noted.  Cardiac exam regular rate and rhythm with no murmurs, rubs or bruits.  Abdomen is soft.  She has good bowel sounds.  There is no fluid wave.  There is no palpable liver or spleen tip.  Back exam shows no tenderness over the spine, ribs or hips.  Extremities shows no clubbing, cyanosis or edema.  She has no venous cord in the legs.  She has negative Homans sign in her legs.  Skin exam shows no rashes, ecchymoses or petechia.  Neurological exam shows no focal neurological deficits. _0 @   Recent Labs    03/23/18 1353  WBC 9.4  HCT 36.7  PLT 363   No results for input(s): NA, K, CL, CO2,  GLUCOSE, BUN, CREATININE, CALCIUM in the last 72 hours.  Blood smear review: None  Pathology: None    Assessment and Plan: [name] is a very charming 72 year old white female.  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  There is the family history with her  mom.  I would think that the possible risk factor would be the breast reduction surgery and the fact that she was not as active afterwards.  She does have terrible arthritis so she really is unable to do a lot of activities anyway.  She is on Xarelto.  Given the extent of her pulmonary emboli, I would keep her on Xarelto for 2 years.  I think this would be reasonable given the severity of her disease.  She had right heart strain.  I will follow-up with a CT angiogram and Doppler of her leg when we see her back in about 6 weeks.  I would like to think that the blood clots in the lung have resolved.  She may have some residual damage.  This might explain the chest wall pain that she has.  I told her that Tylenol arthritis would be reasonable to take.  I do not see that we have to do a hypercoagulable workup on her.  I think that would be a very low yield.  I spent about 45 minutes with she and her brother.  Over half the time was spent face-to-face with them.

## 2018-03-26 LAB — DRVVT CONFIRM: DRVVT CONFIRM: 1.4 ratio — AB (ref 0.8–1.2)

## 2018-03-26 LAB — DRVVT MIX: DRVVT MIX: 81.6 s — AB (ref 0.0–47.0)

## 2018-03-26 LAB — LUPUS ANTICOAGULANT PANEL
DRVVT: 104.3 s — ABNORMAL HIGH (ref 0.0–47.0)
PTT Lupus Anticoagulant: 38.5 s (ref 0.0–51.9)

## 2018-04-25 ENCOUNTER — Other Ambulatory Visit: Payer: Self-pay | Admitting: Family

## 2018-04-25 ENCOUNTER — Telehealth: Payer: Self-pay | Admitting: *Deleted

## 2018-04-25 ENCOUNTER — Ambulatory Visit (HOSPITAL_BASED_OUTPATIENT_CLINIC_OR_DEPARTMENT_OTHER)
Admission: RE | Admit: 2018-04-25 | Discharge: 2018-04-25 | Disposition: A | Discharge status: Home / Self Care | Payer: Medicare Other | Source: Ambulatory Visit | Attending: Family | Admitting: Family

## 2018-04-25 DIAGNOSIS — M7122 Synovial cyst of popliteal space [Baker], left knee: Secondary | ICD-10-CM | POA: Insufficient documentation

## 2018-04-25 DIAGNOSIS — I2609 Other pulmonary embolism with acute cor pulmonale: Secondary | ICD-10-CM | POA: Diagnosis present

## 2018-04-25 DIAGNOSIS — M79605 Pain in left leg: Secondary | ICD-10-CM | POA: Diagnosis present

## 2018-04-25 NOTE — Telephone Encounter (Signed)
Received call from patient stating she is having pain behind her knee and is worried about a possible blood clot.  Returned patients call but got answering machine.  LM to call us back for more information

## 2018-04-25 NOTE — Telephone Encounter (Signed)
Patient with history of PE calling the office with pain behind her knee in her left leg for several weeks. No swelling or redness.   Spoke with Dr Marin Olp and he would like to have a doppler completed on this leg.  Spoke with patient. She is aware of order and will call to schedule her appointment.

## 2018-04-26 ENCOUNTER — Telehealth: Payer: Self-pay | Admitting: *Deleted

## 2018-04-26 NOTE — Telephone Encounter (Addendum)
Patient is aware of results  ----- Message from Eliezer Bottom, NP sent at 04/26/2018  1:32 PM EDT ----- No DVT :)    ----- Message ----- From: Interface, Rad Results In Sent: 04/25/2018   6:02 PM To: Eliezer Bottom, NP

## 2018-05-10 ENCOUNTER — Encounter (HOSPITAL_BASED_OUTPATIENT_CLINIC_OR_DEPARTMENT_OTHER): Payer: Self-pay

## 2018-05-10 ENCOUNTER — Ambulatory Visit (HOSPITAL_BASED_OUTPATIENT_CLINIC_OR_DEPARTMENT_OTHER)
Admission: RE | Admit: 2018-05-10 | Discharge: 2018-05-10 | Disposition: A | Discharge status: Home / Self Care | Payer: Medicare Other | Source: Ambulatory Visit | Attending: Hematology & Oncology | Admitting: Hematology & Oncology

## 2018-05-10 ENCOUNTER — Inpatient Hospital Stay: Payer: Medicare Other | Attending: Hematology & Oncology | Admitting: Hematology & Oncology

## 2018-05-10 ENCOUNTER — Encounter: Payer: Self-pay | Admitting: Hematology & Oncology

## 2018-05-10 ENCOUNTER — Other Ambulatory Visit: Payer: Self-pay

## 2018-05-10 ENCOUNTER — Inpatient Hospital Stay: Payer: Medicare Other

## 2018-05-10 VITALS — BP 160/68 | HR 73 | Temp 97.7°F | Resp 16 | Wt 173.0 lb

## 2018-05-10 DIAGNOSIS — Z7901 Long term (current) use of anticoagulants: Secondary | ICD-10-CM | POA: Insufficient documentation

## 2018-05-10 DIAGNOSIS — I2609 Other pulmonary embolism with acute cor pulmonale: Secondary | ICD-10-CM

## 2018-05-10 DIAGNOSIS — Z79899 Other long term (current) drug therapy: Secondary | ICD-10-CM | POA: Diagnosis not present

## 2018-05-10 DIAGNOSIS — I2690 Septic pulmonary embolism without acute cor pulmonale: Secondary | ICD-10-CM

## 2018-05-10 DIAGNOSIS — I2601 Septic pulmonary embolism with acute cor pulmonale: Secondary | ICD-10-CM

## 2018-05-10 DIAGNOSIS — I82441 Acute embolism and thrombosis of right tibial vein: Secondary | ICD-10-CM | POA: Insufficient documentation

## 2018-05-10 DIAGNOSIS — Z86711 Personal history of pulmonary embolism: Secondary | ICD-10-CM | POA: Insufficient documentation

## 2018-05-10 DIAGNOSIS — I2510 Atherosclerotic heart disease of native coronary artery without angina pectoris: Secondary | ICD-10-CM | POA: Insufficient documentation

## 2018-05-10 DIAGNOSIS — I700 Atherosclerosis of aorta: Secondary | ICD-10-CM | POA: Insufficient documentation

## 2018-05-10 LAB — CBC WITH DIFFERENTIAL (CANCER CENTER ONLY)
BASOS PCT: 0 %
Basophils Absolute: 0 10*3/uL (ref 0.0–0.1)
EOS ABS: 0.1 10*3/uL (ref 0.0–0.5)
EOS PCT: 2 %
HCT: 37.5 % (ref 34.8–46.6)
Hemoglobin: 12.3 g/dL (ref 11.6–15.9)
Lymphocytes Relative: 49 %
Lymphs Abs: 3.4 10*3/uL — ABNORMAL HIGH (ref 0.9–3.3)
MCH: 29.2 pg (ref 26.0–34.0)
MCHC: 32.8 g/dL (ref 32.0–36.0)
MCV: 89.1 fL (ref 81.0–101.0)
MONO ABS: 0.5 10*3/uL (ref 0.1–0.9)
MONOS PCT: 7 %
Neutro Abs: 2.8 10*3/uL (ref 1.5–6.5)
Neutrophils Relative %: 42 %
Platelet Count: 302 10*3/uL (ref 145–400)
RBC: 4.21 MIL/uL (ref 3.70–5.32)
RDW: 13.8 % (ref 11.1–15.7)
WBC Count: 6.8 10*3/uL (ref 3.9–10.0)

## 2018-05-10 LAB — CMP (CANCER CENTER ONLY)
ALBUMIN: 3.7 g/dL (ref 3.5–5.0)
ALK PHOS: 91 U/L — AB (ref 26–84)
ALT: 25 U/L (ref 10–47)
AST: 21 U/L (ref 11–38)
Anion gap: 6 (ref 5–15)
BUN: 18 mg/dL (ref 7–22)
CHLORIDE: 106 mmol/L (ref 98–108)
CO2: 31 mmol/L (ref 18–33)
Calcium: 9.9 mg/dL (ref 8.0–10.3)
Creatinine: 1.1 mg/dL (ref 0.60–1.20)
Glucose, Bld: 102 mg/dL (ref 73–118)
Potassium: 4.3 mmol/L (ref 3.3–4.7)
SODIUM: 143 mmol/L (ref 128–145)
Total Bilirubin: 0.7 mg/dL (ref 0.2–1.6)
Total Protein: 6.9 g/dL (ref 6.4–8.1)

## 2018-05-10 MED ORDER — IOPAMIDOL (ISOVUE-370) INJECTION 76%
100.0000 mL | Freq: Once | INTRAVENOUS | Status: AC | PRN
  Administered 2018-05-10: 100 mL via INTRAVENOUS

## 2018-05-10 NOTE — Progress Notes (Signed)
Hematology and Oncology Follow Up Visit  [name] [id number] [birth year] 72 y.o. 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 q day     Interim History:  Ms. [name] is back for second office visit.  We saw her back in early April.  At that time, she had bilateral pulmonary emboli and a thrombus in the right lower leg.  We put her on Xarelto.  I do not feel that a hypercoagulable work-up would be necessary or helpful.  We went ahead and repeated her CT angiogram today.  This did not show any residual emboli in her lungs.  A Doppler of her right leg showed a short segment thrombus at the mid calf and a right posterior tibial vein.  Of course, her hypercoagulable studies showed a lupus anticoagulant.  This really is no surprise since this lab that we use easily has over a 50% positive rate of lupus anticoagulant.  She feels okay.  She got a new pup for Mother's Day.    She comes in with her brother.  He is quite worried about her.  He make sure that she takes her medications.  She has had no bleeding.  There is been no bruising.  She has had no leg swelling.  She is not wearing a compression stockings.  She is had no chest wall pain.  She has had no fatigue.  She has tried exercise a little bit more and lose weight.  She is eating well.  She has had no nausea or vomiting.  Overall, her performance status is ECOG 1.  Medications:  Current Outpatient Medications:  .  albuterol (PROAIR HFA) 108 (90 BASE) MCG/ACT inhaler, Inhale 2 puffs into the lungs every 4 (four) hours as needed for wheezing or shortness of breath., Disp: 2 Inhaler, Rfl: prn .  albuterol (PROVENTIL) (2.5 MG/3ML) 0.083% nebulizer solution, Take 3 mLs (2.5 mg total) by nebulization every 6 (six) hours as needed for wheezing or shortness of breath., Disp: 1080 mL, Rfl: 3 .  ALPRAZolam  (XANAX) 0.25 MG tablet, Take 1 tablet by mouth at bedtime as needed for anxiety. , Disp: , Rfl:  .  azelastine (ASTELIN) 0.1 % nasal spray, Place into both nostrils 2 (two) times daily. Use in each nostril as directed, Disp: , Rfl:  .  benzonatate (TESSALON) 100 MG capsule, Take 1 capsule (100 mg total) by mouth 3 (three) times daily as needed for cough. (Patient not taking: Reported on 03/01/2018), Disp: 30 capsule, Rfl: 0 .  budesonide (PULMICORT) 0.25 MG/2ML nebulizer solution, Take 2 mLs (0.25 mg total) by nebulization 2 (two) times daily. Dx: J45.20, Disp: 120 mL, Rfl: 5 .  celecoxib (CELEBREX) 200 MG capsule, Take 200 mg by mouth daily., Disp: , Rfl:  .  desvenlafaxine (PRISTIQ) 100 MG 24 hr tablet, Take 100 mg by mouth daily., Disp: , Rfl:  .  dextromethorphan-guaiFENesin (MUCINEX DM) 30-600 MG 12hr tablet, Take 1 tablet by mouth 2 (two) times daily., Disp: 14 tablet, Rfl: 1 .  fluticasone (FLONASE) 50 MCG/ACT nasal spray, Place 2 sprays into both nostrils daily., Disp: 16 g, Rfl: 3 .  Fluticasone-Salmeterol (ADVAIR DISKUS) 250-50 MCG/DOSE AEPB, 1 puff and rinse well, twice daily (Patient taking differently: 2 puffs. 1 puff and rinse well, twice daily), Disp: 60 each, Rfl: 3 .  glucose monitoring kit (FREESTYLE) monitoring kit, 1 each by Does not apply  route as needed for other. Dispense any model that is covered- dispense testing supplies for Q AC/ HS accuchecks- 1 month supply with one refil., Disp: 1 each, Rfl: 1 .  latanoprost (XALATAN) 0.005 % ophthalmic solution, Place 1 drop into both eyes at bedtime. , Disp: , Rfl: 2 .  levocetirizine (XYZAL) 5 MG tablet, Take 5 mg by mouth every evening., Disp: , Rfl:  .  levothyroxine (SYNTHROID, LEVOTHROID) 50 MCG tablet, Take 1 tablet by mouth daily., Disp: , Rfl:  .  losartan-hydrochlorothiazide (HYZAAR) 100-12.5 MG per tablet, Take 1 tablet by mouth daily.  , Disp: , Rfl:  .  Multiple Vitamin (MULTIVITAMIN) tablet, Take 1 tablet by mouth daily.,  Disp: , Rfl:  .  pantoprazole (PROTONIX) 40 MG tablet, TK 1 T PO QD, Disp: , Rfl: 0 .  pregabalin (LYRICA) 75 MG capsule, Take 75 mg by mouth 2 (two) times daily., Disp: , Rfl:  .  ranitidine (ZANTAC) 300 MG tablet, , Disp: , Rfl:  .  rivaroxaban (XARELTO) 20 MG TABS tablet, Take 1 tablet (20 mg total) by mouth daily with supper., Disp: 30 tablet, Rfl: 12 .  Rivaroxaban 15 & 20 MG TBPK, Take as directed on package: Start with one 69m tablet by mouth twice a day with food. On Day 22, switch to one 266mtablet once a day with food., Disp: 51 each, Rfl: 0 .  rOPINIRole (REQUIP) 2 MG tablet, , Disp: , Rfl: 1 .  venlafaxine (EFFEXOR) 75 MG tablet, Take 75 mg by mouth daily., Disp: , Rfl:  .  Vitamin D, Ergocalciferol, (DRISDOL) 50000 units CAPS capsule, Take 50,000 Units by mouth once a week., Disp: , Rfl:   Allergies:  Allergies  Allergen Reactions  . Naproxen Itching and Swelling    Past Medical History, Surgical history, Social history, and Family History were reviewed and updated.  Review of Systems: Review of Systems  Constitutional: Negative.   HENT:  Negative.   Eyes: Negative.   Respiratory: Negative.   Cardiovascular: Negative.   Gastrointestinal: Negative.   Endocrine: Negative.   Genitourinary: Negative.    Musculoskeletal: Negative.   Skin: Negative.   Neurological: Negative.   Hematological: Negative.   Psychiatric/Behavioral: Negative.     Physical Exam:  weight is 173 lb (78.5 kg). Her oral temperature is 97.7 F (36.5 C). Her blood pressure is 160/68 (abnormal) and her pulse is 73. Her respiration is 16 and oxygen saturation is 98%.   Wt Readings from Last 3 Encounters:  05/10/18 173 lb (78.5 kg)  03/23/18 175 lb (79.4 kg)  03/01/18 176 lb 12.8 oz (80.2 kg)    Physical Exam  Constitutional: She is oriented to person, place, and time.  HENT:  Head: Normocephalic and atraumatic.  Mouth/Throat: Oropharynx is clear and moist.  Eyes: Pupils are equal, round,  and reactive to light. EOM are normal.  Neck: Normal range of motion.  Cardiovascular: Normal rate, regular rhythm and normal heart sounds.  Pulmonary/Chest: Effort normal and breath sounds normal.  Abdominal: Soft. Bowel sounds are normal.  Musculoskeletal: Normal range of motion. She exhibits no edema, tenderness or deformity.  Lymphadenopathy:    She has no cervical adenopathy.  Neurological: She is alert and oriented to person, place, and time.  Skin: Skin is warm and dry. No rash noted. No erythema.  Psychiatric: She has a normal mood and affect. Her behavior is normal. Judgment and thought content normal.  Vitals reviewed.    Lab Results  Component Value Date  WBC 6.8 05/10/2018   HGB 12.3 05/10/2018   HCT 37.5 05/10/2018   MCV 89.1 05/10/2018   PLT 302 05/10/2018     Chemistry      Component Value Date/Time   NA 143 05/10/2018 0811   NA 141 11/03/2015 1117   K 4.3 05/10/2018 0811   CL 106 05/10/2018 0811   CO2 31 05/10/2018 0811   BUN 18 05/10/2018 0811   BUN 22 11/03/2015 1117   CREATININE 1.10 05/10/2018 0811      Component Value Date/Time   CALCIUM 9.9 05/10/2018 0811   ALKPHOS 91 (H) 05/10/2018 0811   AST 21 05/10/2018 0811   ALT 25 05/10/2018 0811   BILITOT 0.7 05/10/2018 0811       Impression and Plan: Ms. 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  However, given that she had a positive lupus anticoagulant, we will have to recheck this when we see her back.  I would like to see her back in about 3 or 4 months.  I want to repeat the Doppler of her right leg to see how the thrombus looks.  It does look like it is improving which is nice to see.  I will keep her on full dose Xarelto for right now.  We will see her back, if everything looks good, then I might consider her for maintenance dose.  As always, it is a lot of fun to talk with her.    Volanda Napoleon,  MD 5/23/20195:44 PM

## 2018-05-11 ENCOUNTER — Telehealth: Payer: Self-pay | Admitting: *Deleted

## 2018-05-11 NOTE — Telephone Encounter (Signed)
Patient would like to confirm with Dr Marin Olp that it's okay to take celebrex. Also wants to know if she should continue wearing her compression hose.  Reviewed with Dr Marin Olp. Celebrex is OKAY to take. Dr Marin Olp would like patient to CONTINUE wearing her compression hose.   Patient aware of both recommendations via detailed message left of personal voice mail.

## 2018-05-31 ENCOUNTER — Encounter: Payer: Self-pay | Admitting: Internal Medicine

## 2018-05-31 ENCOUNTER — Ambulatory Visit (INDEPENDENT_AMBULATORY_CARE_PROVIDER_SITE_OTHER): Payer: Medicare Other | Admitting: Internal Medicine

## 2018-05-31 DIAGNOSIS — I2699 Other pulmonary embolism without acute cor pulmonale: Secondary | ICD-10-CM

## 2018-05-31 DIAGNOSIS — J302 Other seasonal allergic rhinitis: Secondary | ICD-10-CM

## 2018-05-31 DIAGNOSIS — J4520 Mild intermittent asthma, uncomplicated: Secondary | ICD-10-CM

## 2018-05-31 DIAGNOSIS — J3089 Other allergic rhinitis: Secondary | ICD-10-CM | POA: Diagnosis not present

## 2018-05-31 NOTE — Progress Notes (Signed)
HPI F never smoker followed for allergic rhinitis, asthma, complicated by GERD, HBP, glaucoma  -------------------------------------------------------------------------------------- 01/26/2017-72 year old female never smoker followed for allergic rhinitis, asthma, complicated by GERD, HBP, glaucoma FOLLOWS FOR:  Pt denies any recent flares. Pt states that normally she has issues with weather change. Episode of bronchitis before Christmas. No wheezes only if has a significant viral illness. Uses her nebulizer machine about twice a week. Currently on antibiotic for sinusitis. Because of GERD she sleeps propped up and continues proton pump inhibitor   05/31/2018- 72 year old female never smoker followed for allergic rhinitis, asthma, complicated by GERD, HBP, glaucoma, PE/ R DVT 02/22/18/ Xarelto ----3 month follow up for asthma. States her SOB has increased within the last week. Has a dry cough.  Diagnosed with PE in March and started on Xarelto.  Shortness of breath has improved with no new events.  Recently has noted some dry cough triggered by mowing the lawn.  Postnasal drip treated with as a lasting.  ROS- see HPI + = positive Constitutional:   No-   weight loss, night sweats, fevers, chills, fatigue, lassitude. HEENT:   No-  headaches, difficulty swallowing, tooth/dental problems, sore throat,       No-  sneezing, itching, ear ache,    +nasal congestion, no-post nasal drip,  CV:  No-   chest pain, orthopnea, PND, swelling in lower extremities, anasarca, dizziness, palpitations Resp: No-   shortness of breath with exertion or at rest.              No-  productive cough,  + non-productive cough,  No- coughing up of blood.              No-   change in color of mucus.  + wheezing.   Skin: No-   rash or lesions. GI:  No-   +heartburn, indigestion, no-abdominal pain, nausea, vomiting, GU: MS: +  joint pain or swelling.   Neuro-     nothing unusual Psych:  No- change in mood or affect. No-  depression or anxiety.  No memory loss.  OBJ General- Alert, Oriented, Affect-appropriate, Distress- none acute, + overweight Skin- rash-none, lesions- none, excoriation- none Lymphadenopathy- none Head- atraumatic            Eyes- Gross vision intact, PERRLA, conjunctivae clear secretions            Ears- Hearing, canals-normal- minor wax.            Nose- +turbinate edema, no-Septal dev, mucus, polyps, erosion, perforation             Throat- Mallampati II , mucosa clear , drainage- none seen, tonsils- atrophic ,   Neck- flexible , trachea midline, no stridor , thyroid nl, carotid no bruit Chest - symmetrical excursion , unlabored           Heart/CV- RRR , no murmur , no gallop  , no rub, nl s1 s2                           - JVD- none , edema- none, stasis changes- none, varices- none           Lung- clear to P&A,  Cough-none, dullness-none, rub- none, wheeze-none           Chest wall-  Abd-  Br/ Gen/ Rectal- Not done, not indicated Extrem- cyanosis- none, clubbing, none, atrophy- none, strength- nl. + Osteoarthritic deformity of fingers.  Neuro- grossly intact to observation

## 2018-05-31 NOTE — Patient Instructions (Signed)
We suggested claritin and maybe Flonase if you need help with allergies.  Please call if we can help

## 2018-06-01 ENCOUNTER — Ambulatory Visit: Payer: Self-pay | Admitting: Internal Medicine

## 2018-06-06 NOTE — Assessment & Plan Note (Signed)
No new chest pain.  Dyspnea is improving.  Tolerating Xarelto with no bleeding.  Managed elsewhere.

## 2018-06-06 NOTE — Assessment & Plan Note (Signed)
Adequate control with current meds.  Minor aggravation by dust exposure from mowing should be controlled by present meds.

## 2018-06-06 NOTE — Assessment & Plan Note (Signed)
Okay to use combination of Flonase and as a lasting, or OTC antihistamine as discussed.

## 2018-06-14 ENCOUNTER — Other Ambulatory Visit: Payer: Self-pay | Admitting: Physician Assistant

## 2018-06-14 DIAGNOSIS — Z1231 Encounter for screening mammogram for malignant neoplasm of breast: Secondary | ICD-10-CM

## 2018-07-16 ENCOUNTER — Ambulatory Visit: Payer: Self-pay

## 2018-07-16 ENCOUNTER — Ambulatory Visit
Admission: RE | Admit: 2018-07-16 | Discharge: 2018-07-16 | Disposition: A | Discharge status: Home / Self Care | Payer: Medicare Other | Source: Ambulatory Visit | Attending: Physician Assistant | Admitting: Physician Assistant

## 2018-07-16 DIAGNOSIS — Z1231 Encounter for screening mammogram for malignant neoplasm of breast: Secondary | ICD-10-CM

## 2018-09-06 ENCOUNTER — Encounter: Payer: Self-pay | Admitting: Hematology & Oncology

## 2018-09-06 ENCOUNTER — Inpatient Hospital Stay: Payer: Medicare Other

## 2018-09-06 ENCOUNTER — Inpatient Hospital Stay: Payer: Medicare Other | Attending: Hematology & Oncology | Admitting: Hematology & Oncology

## 2018-09-06 ENCOUNTER — Ambulatory Visit (HOSPITAL_BASED_OUTPATIENT_CLINIC_OR_DEPARTMENT_OTHER)
Admission: RE | Admit: 2018-09-06 | Discharge: 2018-09-06 | Disposition: A | Discharge status: Home / Self Care | Payer: Medicare Other | Source: Ambulatory Visit | Attending: Hematology & Oncology | Admitting: Hematology & Oncology

## 2018-09-06 ENCOUNTER — Other Ambulatory Visit: Payer: Self-pay

## 2018-09-06 VITALS — BP 162/79 | HR 79 | Temp 97.5°F | Resp 18 | Wt 182.0 lb

## 2018-09-06 DIAGNOSIS — Z79899 Other long term (current) drug therapy: Secondary | ICD-10-CM

## 2018-09-06 DIAGNOSIS — I2601 Septic pulmonary embolism with acute cor pulmonale: Secondary | ICD-10-CM

## 2018-09-06 DIAGNOSIS — I82591 Chronic embolism and thrombosis of other specified deep vein of right lower extremity: Secondary | ICD-10-CM | POA: Diagnosis not present

## 2018-09-06 DIAGNOSIS — I82441 Acute embolism and thrombosis of right tibial vein: Secondary | ICD-10-CM | POA: Diagnosis not present

## 2018-09-06 DIAGNOSIS — I2699 Other pulmonary embolism without acute cor pulmonale: Secondary | ICD-10-CM

## 2018-09-06 DIAGNOSIS — Z7901 Long term (current) use of anticoagulants: Secondary | ICD-10-CM | POA: Diagnosis not present

## 2018-09-06 DIAGNOSIS — M7121 Synovial cyst of popliteal space [Baker], right knee: Secondary | ICD-10-CM | POA: Diagnosis not present

## 2018-09-06 DIAGNOSIS — D6862 Lupus anticoagulant syndrome: Secondary | ICD-10-CM

## 2018-09-06 DIAGNOSIS — I2690 Septic pulmonary embolism without acute cor pulmonale: Secondary | ICD-10-CM | POA: Insufficient documentation

## 2018-09-06 DIAGNOSIS — Z86711 Personal history of pulmonary embolism: Secondary | ICD-10-CM

## 2018-09-06 LAB — CBC WITH DIFFERENTIAL (CANCER CENTER ONLY)
BASOS ABS: 0 10*3/uL (ref 0.0–0.1)
Basophils Relative: 1 %
EOS ABS: 0.2 10*3/uL (ref 0.0–0.5)
EOS PCT: 3 %
HCT: 36.7 % (ref 34.8–46.6)
HEMOGLOBIN: 11.6 g/dL (ref 11.6–15.9)
Lymphocytes Relative: 46 %
Lymphs Abs: 3.1 10*3/uL (ref 0.9–3.3)
MCH: 28 pg (ref 26.0–34.0)
MCHC: 31.6 g/dL — AB (ref 32.0–36.0)
MCV: 88.4 fL (ref 81.0–101.0)
Monocytes Absolute: 0.5 10*3/uL (ref 0.1–0.9)
Monocytes Relative: 8 %
NEUTROS PCT: 42 %
Neutro Abs: 2.8 10*3/uL (ref 1.5–6.5)
Platelet Count: 307 10*3/uL (ref 145–400)
RBC: 4.15 MIL/uL (ref 3.70–5.32)
RDW: 14.1 % (ref 11.1–15.7)
WBC: 6.7 10*3/uL (ref 3.9–10.0)

## 2018-09-06 LAB — CMP (CANCER CENTER ONLY)
ALT: 15 U/L (ref 0–44)
ANION GAP: 10 (ref 5–15)
AST: 18 U/L (ref 15–41)
Albumin: 3.8 g/dL (ref 3.5–5.0)
Alkaline Phosphatase: 104 U/L (ref 38–126)
BILIRUBIN TOTAL: 0.5 mg/dL (ref 0.3–1.2)
BUN: 16 mg/dL (ref 8–23)
CALCIUM: 10.1 mg/dL (ref 8.9–10.3)
CO2: 26 mmol/L (ref 22–32)
CREATININE: 1.15 mg/dL — AB (ref 0.44–1.00)
Chloride: 108 mmol/L (ref 98–111)
GFR, EST AFRICAN AMERICAN: 55 mL/min — AB (ref >60)
GFR, EST NON AFRICAN AMERICAN: 47 mL/min — AB (ref >60)
GLUCOSE: 102 mg/dL — AB (ref 70–99)
POTASSIUM: 5 mmol/L (ref 3.5–5.1)
Sodium: 144 mmol/L (ref 135–145)
Total Protein: 7 g/dL (ref 6.5–8.1)

## 2018-09-06 MED ORDER — RIVAROXABAN 10 MG PO TABS: 10.0000 mg | ORAL_TABLET | Freq: Every day | ORAL | 12 refills | Status: DC

## 2018-09-06 NOTE — Progress Notes (Signed)
Hematology and Oncology Follow Up Visit  [name] [id number] [birth year] 72 y.o. 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 q day -- maintanence on 09/06/2018  EC ASA 81 mg po q day     Interim History:  Ms. Rotundo is back for follow-up.  She is doing okay.  She had a nice summer.  She unfortunately, has gained weight.  She has bad arthritis.  She is doing not able to exercise all that much.  Of note, she has a persistently positive lupus anticoagulant.  As such, I think this is a real finding that we have to address.  I will go ahead and switch her Xarelto down to 10 mg a day as a maintenance now.  She will also start aspirin at 81 mg p.o. daily.  We did do a Doppler of her right leg.  She has a stable nonocclusive thrombus in the right gastrocnemius vein.  I suspect she will always have this.  She is enjoying her new puppy.  It is a [place] terrier.  She has had no fever.  She has had no rashes.  Is been no change in bowel or bladder habits.  Her performance status is ECOG 1.   Medications:  Current Outpatient Medications:  .  albuterol (PROAIR HFA) 108 (90 BASE) MCG/ACT inhaler, Inhale 2 puffs into the lungs every 4 (four) hours as needed for wheezing or shortness of breath., Disp: 2 Inhaler, Rfl: prn .  albuterol (PROVENTIL) (2.5 MG/3ML) 0.083% nebulizer solution, Take 3 mLs (2.5 mg total) by nebulization every 6 (six) hours as needed for wheezing or shortness of breath., Disp: 1080 mL, Rfl: 3 .  ALPRAZolam (XANAX) 0.25 MG tablet, Take 1 tablet by mouth at bedtime as needed for anxiety. , Disp: , Rfl:  .  azelastine (ASTELIN) 0.1 % nasal spray, Place into both nostrils 2 (two) times daily. Use in each nostril as directed, Disp: , Rfl:  .  budesonide (PULMICORT) 0.25 MG/2ML nebulizer solution, Take 2 mLs (0.25 mg  total) by nebulization 2 (two) times daily. Dx: J45.20, Disp: 120 mL, Rfl: 5 .  celecoxib (CELEBREX) 200 MG capsule, Take 200 mg by mouth daily., Disp: , Rfl:  .  desvenlafaxine (PRISTIQ) 100 MG 24 hr tablet, Take 100 mg by mouth daily., Disp: , Rfl:  .  dextromethorphan-guaiFENesin (MUCINEX DM) 30-600 MG 12hr tablet, Take 1 tablet by mouth 2 (two) times daily., Disp: 14 tablet, Rfl: 1 .  fluticasone (FLONASE) 50 MCG/ACT nasal spray, Place 2 sprays into both nostrils daily., Disp: 16 g, Rfl: 3 .  glucose monitoring kit (FREESTYLE) monitoring kit, 1 each by Does not apply route as needed for other. Dispense any model that is covered- dispense testing supplies for Q AC/ HS accuchecks- 1 month supply with one refil., Disp: 1 each, Rfl: 1 .  latanoprost (XALATAN) 0.005 % ophthalmic solution, Place 1 drop into both eyes at bedtime. , Disp: , Rfl: 2 .  levocetirizine (XYZAL) 5 MG tablet, Take 5 mg by mouth every evening., Disp: , Rfl:  .  levothyroxine (SYNTHROID, LEVOTHROID) 50 MCG tablet, Take 1 tablet by mouth daily., Disp: , Rfl:  .  losartan-hydrochlorothiazide (HYZAAR) 100-12.5 MG per tablet, Take 1 tablet by mouth daily.  , Disp: , Rfl:  .  Multiple Vitamin (MULTIVITAMIN) tablet, Take 1 tablet by mouth daily., Disp: , Rfl:  .  pantoprazole (PROTONIX) 40 MG tablet, TK 1 T PO QD, Disp: , Rfl: 0 .  pregabalin (LYRICA) 75 MG capsule, Take 75 mg by mouth 2 (two) times daily., Disp: , Rfl:  .  ranitidine (ZANTAC) 300 MG tablet, , Disp: , Rfl:  .  rivaroxaban (XARELTO) 20 MG TABS tablet, Take 1 tablet (20 mg total) by mouth daily with supper., Disp: 30 tablet, Rfl: 12 .  Rivaroxaban 15 & 20 MG TBPK, Take as directed on package: Start with one 29m tablet by mouth twice a day with food. On Day 22, switch to one 290mtablet once a day with food., Disp: 51 each, Rfl: 0 .  rOPINIRole (REQUIP) 2 MG tablet, , Disp: , Rfl: 1 .  venlafaxine (EFFEXOR) 75 MG tablet, Take 75 mg by mouth daily., Disp: , Rfl:  .   Vitamin D, Ergocalciferol, (DRISDOL) 50000 units CAPS capsule, Take 50,000 Units by mouth once a week., Disp: , Rfl:   Allergies:  Allergies  Allergen Reactions  . Naproxen Itching and Swelling    Past Medical History, Surgical history, Social history, and Family History were reviewed and updated.  Review of Systems: Review of Systems  Constitutional: Negative.   HENT:  Negative.   Eyes: Negative.   Respiratory: Negative.   Cardiovascular: Negative.   Gastrointestinal: Negative.   Endocrine: Negative.   Genitourinary: Negative.    Musculoskeletal: Negative.   Skin: Negative.   Neurological: Negative.   Hematological: Negative.   Psychiatric/Behavioral: Negative.     Physical Exam:  weight is 182 lb (82.6 kg). Her oral temperature is 97.5 F (36.4 C) (abnormal). Her blood pressure is 162/79 (abnormal) and her pulse is 79. Her respiration is 18 and oxygen saturation is 100%.   Wt Readings from Last 3 Encounters:  09/06/18 182 lb (82.6 kg)  05/31/18 177 lb (80.3 kg)  05/10/18 173 lb (78.5 kg)    Physical Exam  Constitutional: She is oriented to person, place, and time.  HENT:  Head: Normocephalic and atraumatic.  Mouth/Throat: Oropharynx is clear and moist.  Eyes: Pupils are equal, round, and reactive to light. EOM are normal.  Neck: Normal range of motion.  Cardiovascular: Normal rate, regular rhythm and normal heart sounds.  Pulmonary/Chest: Effort normal and breath sounds normal.  Abdominal: Soft. Bowel sounds are normal.  Musculoskeletal: Normal range of motion. She exhibits no edema, tenderness or deformity.  Lymphadenopathy:    She has no cervical adenopathy.  Neurological: She is alert and oriented to person, place, and time.  Skin: Skin is warm and dry. No rash noted. No erythema.  Psychiatric: She has a normal mood and affect. Her behavior is normal. Judgment and thought content normal.  Vitals reviewed.    Lab Results  Component Value Date   WBC 6.7  09/06/2018   HGB 11.6 09/06/2018   HCT 36.7 09/06/2018   MCV 88.4 09/06/2018   PLT 307 09/06/2018     Chemistry      Component Value Date/Time   NA 143 05/10/2018 0811   NA 141 11/03/2015 1117   K 4.3 05/10/2018 0811   CL 106 05/10/2018 0811   CO2 31 05/10/2018 0811   BUN 18 05/10/2018 0811   BUN 22 11/03/2015 1117   CREATININE 1.10 05/10/2018 0811      Component Value Date/Time   CALCIUM 9.9 05/10/2018 0811   ALKPHOS 91 (H) 05/10/2018 0811   AST 21 05/10/2018 0811   ALT 25 05/10/2018 0811   BILITOT 0.7 05/10/2018 [id number]  Impression and Plan: Ms. Millspaugh is a 72 year old white female with a pulmonary emboli and lower extremity thromboembolic disease.  She has a persistently positive lupus anticoagulant.  As such, I think we have to address this with baby aspirin while she is on Xarelto.  We will change her therapeutic Xarelto to maintenance Xarelto 10 mg a day.  I will keep her on this for 1 year.  I will then think about getting her on to full dose aspirin because of the lupus anticoagulant.  She will always have this thrombus in the right leg.  I will get her back in 2 months.  I want to see her back around the holidays so that everything is optimal for the holiday season.    Volanda Napoleon, MD 9/19/201910:23 AM

## 2018-09-08 LAB — LUPUS ANTICOAGULANT PANEL
DRVVT: 63.7 s — ABNORMAL HIGH (ref 0.0–47.0)
PTT Lupus Anticoagulant: 30.4 s (ref 0.0–51.9)

## 2018-09-08 LAB — DRVVT CONFIRM: dRVVT Confirm: 1.3 ratio — ABNORMAL HIGH (ref 0.8–1.2)

## 2018-09-08 LAB — DRVVT MIX: DRVVT MIX: 55.5 s — AB (ref 0.0–47.0)

## 2018-11-01 ENCOUNTER — Encounter: Payer: Self-pay | Admitting: Hematology & Oncology

## 2018-11-01 ENCOUNTER — Inpatient Hospital Stay: Payer: Medicare Other | Attending: Hematology & Oncology | Admitting: Hematology & Oncology

## 2018-11-01 ENCOUNTER — Inpatient Hospital Stay: Payer: Medicare Other

## 2018-11-01 ENCOUNTER — Other Ambulatory Visit: Payer: Self-pay

## 2018-11-01 VITALS — BP 149/76 | HR 78 | Temp 97.9°F | Resp 18 | Wt 178.0 lb

## 2018-11-01 DIAGNOSIS — Z79899 Other long term (current) drug therapy: Secondary | ICD-10-CM

## 2018-11-01 DIAGNOSIS — Z791 Long term (current) use of non-steroidal anti-inflammatories (NSAID): Secondary | ICD-10-CM | POA: Diagnosis not present

## 2018-11-01 DIAGNOSIS — I2699 Other pulmonary embolism without acute cor pulmonale: Secondary | ICD-10-CM

## 2018-11-01 DIAGNOSIS — D6862 Lupus anticoagulant syndrome: Secondary | ICD-10-CM | POA: Diagnosis present

## 2018-11-01 DIAGNOSIS — Z7901 Long term (current) use of anticoagulants: Secondary | ICD-10-CM | POA: Insufficient documentation

## 2018-11-01 DIAGNOSIS — K5900 Constipation, unspecified: Secondary | ICD-10-CM | POA: Diagnosis not present

## 2018-11-01 DIAGNOSIS — Z86711 Personal history of pulmonary embolism: Secondary | ICD-10-CM

## 2018-11-01 DIAGNOSIS — R760 Raised antibody titer: Secondary | ICD-10-CM

## 2018-11-01 LAB — CMP (CANCER CENTER ONLY)
ALBUMIN: 3.7 g/dL (ref 3.5–5.0)
ALK PHOS: 96 U/L (ref 38–126)
ALT: 14 U/L (ref 0–44)
AST: 15 U/L (ref 15–41)
Anion gap: 9 (ref 5–15)
BUN: 18 mg/dL (ref 8–23)
CALCIUM: 9.9 mg/dL (ref 8.9–10.3)
CO2: 27 mmol/L (ref 22–32)
CREATININE: 1.21 mg/dL — AB (ref 0.44–1.00)
Chloride: 108 mmol/L (ref 98–111)
GFR, Est AFR Am: 51 mL/min — ABNORMAL LOW (ref >60)
GFR, Estimated: 44 mL/min — ABNORMAL LOW (ref >60)
GLUCOSE: 112 mg/dL — AB (ref 70–99)
Potassium: 4.7 mmol/L (ref 3.5–5.1)
SODIUM: 144 mmol/L (ref 135–145)
Total Bilirubin: 0.6 mg/dL (ref 0.3–1.2)
Total Protein: 6.9 g/dL (ref 6.5–8.1)

## 2018-11-01 LAB — CBC WITH DIFFERENTIAL (CANCER CENTER ONLY)
Abs Immature Granulocytes: 0.01 10*3/uL (ref 0.00–0.07)
BASOS PCT: 1 %
Basophils Absolute: 0 10*3/uL (ref 0.0–0.1)
EOS PCT: 1 %
Eosinophils Absolute: 0.1 10*3/uL (ref 0.0–0.5)
HCT: 38 % (ref 36.0–46.0)
Hemoglobin: 11.8 g/dL — ABNORMAL LOW (ref 12.0–15.0)
Immature Granulocytes: 0 %
Lymphocytes Relative: 46 %
Lymphs Abs: 3.4 10*3/uL (ref 0.7–4.0)
MCH: 27.4 pg (ref 26.0–34.0)
MCHC: 31.1 g/dL (ref 30.0–36.0)
MCV: 88.4 fL (ref 80.0–100.0)
MONO ABS: 0.6 10*3/uL (ref 0.1–1.0)
MONOS PCT: 8 %
NEUTROS PCT: 44 %
Neutro Abs: 3.2 10*3/uL (ref 1.7–7.7)
PLATELETS: 312 10*3/uL (ref 150–400)
RBC: 4.3 MIL/uL (ref 3.87–5.11)
RDW: 14.2 % (ref 11.5–15.5)
WBC Count: 7.3 10*3/uL (ref 4.0–10.5)
nRBC: 0 % (ref 0.0–0.2)

## 2018-11-01 LAB — LACTATE DEHYDROGENASE: LDH: 201 U/L — ABNORMAL HIGH (ref 98–192)

## 2018-11-01 NOTE — Progress Notes (Signed)
Hematology and Oncology Follow Up Visit  [name] [id number] 1946-05-07 72 y.o. 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 q day -- maintanence on 09/06/2018  EC ASA 81 mg po q day     Interim History:  [name] is back for follow-up.  She is doing okay.  She is little bit sad because she had to give back a little pop that she had gotten.  The pump was just tiny and was getting under the feet of she and her husband.  They had some falls.  As such, she did not want to risk the possibility of having a fall and then breaking a hip.  She is doing well on the low-dose Xarelto.  We are rechecking her lupus anticoagulant.  I will also have to check a anticardiolipin antibody and a beta-2 glycoprotein.  If she is positive for all 3, then we will probably get her off the Xarelto and onto full dose aspirin.  She is trying to lose weight.  She is had no problems with nausea or vomiting.  Apparently, her daughter fell and fractured her tibia.  This was around the knee.  It sounds like this might be a real problem for her.  She has had no cough.  Does have a little bit of asthma.  She is had no fever.  There is been no bleeding.  She is a little constipated.  She takes a stool softener.    Her performance status is ECOG 1.   Medications:  Current Outpatient Medications:  .  senna (SENOKOT) 8.6 MG TABS tablet, Take 8.6 mg by mouth daily., Disp: , Rfl:  .  ADVAIR DISKUS 250-50 MCG/DOSE AEPB, Inhale one puff into the lungs 2 (two) times daily., Disp: , Rfl: 2 .  albuterol (PROAIR HFA) 108 (90 BASE) MCG/ACT inhaler, Inhale 2 puffs into the lungs every 4 (four) hours as needed for wheezing or shortness of breath., Disp: 2 Inhaler, Rfl: prn .  albuterol (PROVENTIL) (2.5 MG/3ML) 0.083% nebulizer solution, Take 3 mLs (2.5 mg total)  by nebulization every 6 (six) hours as needed for wheezing or shortness of breath., Disp: 1080 mL, Rfl: 3 .  ALPRAZolam (XANAX) 0.25 MG tablet, Take 1 tablet by mouth at bedtime as needed for anxiety. , Disp: , Rfl:  .  azelastine (ASTELIN) 0.1 % nasal spray, Place into both nostrils 2 (two) times daily. Use in each nostril as directed, Disp: , Rfl:  .  budesonide (PULMICORT) 0.25 MG/2ML nebulizer solution, Take 2 mLs (0.25 mg total) by nebulization 2 (two) times daily. Dx: J45.20, Disp: 120 mL, Rfl: 5 .  celecoxib (CELEBREX) 100 MG capsule, Take 100 mg by mouth daily., Disp: , Rfl: 5 .  celecoxib (CELEBREX) 200 MG capsule, Take 200 mg by mouth daily., Disp: , Rfl:  .  desvenlafaxine (PRISTIQ) 100 MG 24 hr tablet, Take 100 mg by mouth daily., Disp: , Rfl:  .  dextromethorphan-guaiFENesin (MUCINEX DM) 30-600 MG 12hr tablet, Take 1 tablet by mouth 2 (two) times daily., Disp: 14 tablet, Rfl: 1 .  fluticasone (FLONASE) 50 MCG/ACT nasal spray, Place 2 sprays into both nostrils daily., Disp: 16 g, Rfl: 3 .  glucose monitoring kit (FREESTYLE) monitoring kit, 1 each by Does not apply route as needed for other. Dispense any model that is covered- dispense testing supplies for Q AC/ HS  accuchecks- 1 month supply with one refil., Disp: 1 each, Rfl: 1 .  latanoprost (XALATAN) 0.005 % ophthalmic solution, Place 1 drop into both eyes at bedtime. , Disp: , Rfl: 2 .  levocetirizine (XYZAL) 5 MG tablet, Take 5 mg by mouth every evening., Disp: , Rfl:  .  levothyroxine (SYNTHROID, LEVOTHROID) 50 MCG tablet, Take 1 tablet by mouth daily., Disp: , Rfl:  .  losartan-hydrochlorothiazide (HYZAAR) 100-12.5 MG per tablet, Take 1 tablet by mouth daily.  , Disp: , Rfl:  .  Multiple Vitamin (MULTIVITAMIN) tablet, Take 1 tablet by mouth daily., Disp: , Rfl:  .  pantoprazole (PROTONIX) 40 MG tablet, TK 1 T PO QD, Disp: , Rfl: 0 .  pregabalin (LYRICA) 75 MG capsule, Take 75 mg by mouth 2 (two) times daily., Disp: , Rfl:  .   ranitidine (ZANTAC) 300 MG tablet, , Disp: , Rfl:  .  rivaroxaban (XARELTO) 10 MG TABS tablet, Take 1 tablet (10 mg total) by mouth daily with supper., Disp: 30 tablet, Rfl: 12 .  rOPINIRole (REQUIP) 2 MG tablet, , Disp: , Rfl: 1 .  venlafaxine (EFFEXOR) 75 MG tablet, Take 75 mg by mouth daily., Disp: , Rfl:  .  Vitamin D, Ergocalciferol, (DRISDOL) 50000 units CAPS capsule, Take 50,000 Units by mouth once a week., Disp: , Rfl:   Allergies:  Allergies  Allergen Reactions  . Naproxen Itching and Swelling    Past Medical History, Surgical history, Social history, and Family History were reviewed and updated.  Review of Systems: Review of Systems  Constitutional: Negative.   HENT:  Negative.   Eyes: Negative.   Respiratory: Negative.   Cardiovascular: Negative.   Gastrointestinal: Negative.   Endocrine: Negative.   Genitourinary: Negative.    Musculoskeletal: Negative.   Skin: Negative.   Neurological: Negative.   Hematological: Negative.   Psychiatric/Behavioral: Negative.     Physical Exam:  weight is 178 lb (80.7 kg). Her oral temperature is 97.9 F (36.6 C). Her blood pressure is 149/76 (abnormal) and her pulse is 78. Her respiration is 18 and oxygen saturation is 98%.   Wt Readings from Last 3 Encounters:  11/01/18 178 lb (80.7 kg)  09/06/18 182 lb (82.6 kg)  05/31/18 177 lb (80.3 kg)    Physical Exam  Constitutional: She is oriented to person, place, and time.  HENT:  Head: Normocephalic and atraumatic.  Mouth/Throat: Oropharynx is clear and moist.  Eyes: Pupils are equal, round, and reactive to light. EOM are normal.  Neck: Normal range of motion.  Cardiovascular: Normal rate, regular rhythm and normal heart sounds.  Pulmonary/Chest: Effort normal and breath sounds normal.  Abdominal: Soft. Bowel sounds are normal.  Musculoskeletal: Normal range of motion. She exhibits no edema, tenderness or deformity.  Lymphadenopathy:    She has no cervical adenopathy.    Neurological: She is alert and oriented to person, place, and time.  Skin: Skin is warm and dry. No rash noted. No erythema.  Psychiatric: She has a normal mood and affect. Her behavior is normal. Judgment and thought content normal.  Vitals reviewed.    Lab Results  Component Value Date   WBC 7.3 11/01/2018   HGB 11.8 (L) 11/01/2018   HCT 38.0 11/01/2018   MCV 88.4 11/01/2018   PLT 312 11/01/2018     Chemistry      Component Value Date/Time   NA 144 09/06/2018 0909   NA 141 11/03/2015 1117   K 5.0 09/06/2018 0909   CL 108 09/06/2018  0909   CO2 26 09/06/2018 0909   BUN 16 09/06/2018 0909   BUN 22 11/03/2015 1117   CREATININE 1.15 (H) 09/06/2018 0909      Component Value Date/Time   CALCIUM 10.1 09/06/2018 0909   ALKPHOS 104 09/06/2018 0909   AST 18 09/06/2018 0909   ALT 15 09/06/2018 0909   BILITOT 0.5 09/06/2018 0909       Impression and Plan: Ms. Seltzer is a 72 year old white female with a pulmonary emboli and lower extremity thromboembolic disease.  She has a persistently positive lupus anticoagulant.  As such, I think we have to address this with baby aspirin while she is on Xarelto.  We will change her therapeutic Xarelto to maintenance Xarelto 10 mg a day.  I will keep her on this for 1 year.  I will then think about getting her on to full dose aspirin because of the lupus anticoagulant.  We will not get her back in 4 months.    Volanda Napoleon, MD 11/14/20199:00 AM

## 2018-11-21 ENCOUNTER — Other Ambulatory Visit: Payer: Self-pay | Admitting: Orthopedic Surgery

## 2018-12-04 ENCOUNTER — Ambulatory Visit (INDEPENDENT_AMBULATORY_CARE_PROVIDER_SITE_OTHER): Payer: Medicare Other | Admitting: Internal Medicine

## 2018-12-04 ENCOUNTER — Encounter: Payer: Self-pay | Admitting: Internal Medicine

## 2018-12-04 VITALS — BP 132/74 | HR 88 | Ht 61.0 in | Wt 181.2 lb

## 2018-12-04 DIAGNOSIS — J4520 Mild intermittent asthma, uncomplicated: Secondary | ICD-10-CM | POA: Diagnosis not present

## 2018-12-04 DIAGNOSIS — K219 Gastro-esophageal reflux disease without esophagitis: Secondary | ICD-10-CM | POA: Diagnosis not present

## 2018-12-04 NOTE — Progress Notes (Signed)
HPI F never smoker followed for allergic rhinitis, asthma, complicated by GERD, HBP, glaucoma  --------------------------------------------------------------------------------------  05/31/2018- 72 year old female never smoker followed for allergic rhinitis, asthma, complicated by GERD, HBP, glaucoma, PE/ R DVT 02/22/18/ Xarelto ----3 month follow up for asthma. States her SOB has increased within the last week. Has a dry cough.  Diagnosed with PE in March and started on Xarelto.  Shortness of breath has improved with no new events.  Recently has noted some dry cough triggered by mowing the lawn.  Postnasal drip treated with as a lasting.  12/04/2018- 72 year old female never smoker followed for allergic rhinitis, asthma, complicated by GERD, HBP, glaucoma, PE/ R DVT 02/22/18/ Xarelto/+ lupus anticoagulant Dr Marin Olp plans Xarelto for 1 year, then possibly aspirin. Advair 250, pro-air HFA, neb albuterol/ pulmicort, Mucinex DM,  Pulmonary embolism in March now being managed by hematology on Xarelto. Persistent dry cough.  Blames history of sinus infections but says now doing very well. Still occasional heartburn but much better than years ago-diet controlled.  Sleeps with head up on pillows. CTa chest 05/10/2018- IMPRESSION: 1. Interval resolution of acute pulmonary emboli. No evidence of interval embolus nor stigmata of chronic PE. 2. Atherosclerosis, including Aortic Atherosclerosis (ICD10-170.0) and coronary artery disease. Please note that although the presence of coronary artery calcium documents the presence of coronary artery disease, the severity of this disease and any potential stenosis cannot be assessed on this non-gated CT examination. Assessment for potential risk factor modification, dietary therapy or pharmacologic therapy may be warranted, if clinically indicated.  ROS- see HPI + = positive Constitutional:   No-   weight loss, night sweats, fevers, chills, fatigue,  lassitude. HEENT:   No-  headaches, difficulty swallowing, tooth/dental problems, sore throat,       No-  sneezing, itching, ear ache,    +nasal congestion, no-post nasal drip,  CV:  No-   chest pain, orthopnea, PND, swelling in lower extremities, anasarca, dizziness, palpitations Resp: No-   shortness of breath with exertion or at rest.              No-  productive cough,  + non-productive cough,  No- coughing up of blood.              No-   change in color of mucus.  + wheezing.   Skin: No-   rash or lesions. GI:  No-   +heartburn, indigestion, no-abdominal pain, nausea, vomiting, GU: MS: +  joint pain or swelling.   Neuro-     nothing unusual Psych:  No- change in mood or affect. No- depression or anxiety.  No memory loss.  OBJ General- Alert, Oriented, Affect-appropriate, Distress- none acute, + overweight Skin- rash-none, lesions- none, excoriation- none Lymphadenopathy- none Head- atraumatic            Eyes- Gross vision intact, PERRLA, conjunctivae clear secretions            Ears- Hearing, canals-normal- minor wax.            Nose- turbinate edema, no-Septal dev, mucus, polyps, erosion, perforation             Throat- Mallampati II , mucosa clear , drainage- none seen, tonsils- atrophic ,   Neck- flexible , trachea midline, no stridor , thyroid nl, carotid no bruit Chest - symmetrical excursion , unlabored           Heart/CV- RRR , no murmur , no gallop  , no rub, nl s1 s2                           -  JVD- none , edema- none, stasis changes- none, varices- none           Lung- clear to P&A,  Cough-none, dullness-none, rub- none, wheeze-none           Chest wall-  Abd-  Br/ Gen/ Rectal- Not done, not indicated Extrem- cyanosis- none, clubbing, none, atrophy- none, strength- nl. + Osteoarthritic deformity of fingers.              Neuro- grossly intact to observation

## 2018-12-04 NOTE — Patient Instructions (Signed)
Keeping head of bed comfortably elevated may help the cough.  If cough gets worse, please let me know.  Please call if I can help

## 2019-01-01 ENCOUNTER — Ambulatory Visit (HOSPITAL_BASED_OUTPATIENT_CLINIC_OR_DEPARTMENT_OTHER): Admit: 2019-01-01 | Payer: Medicare Other | Admitting: Orthopedic Surgery

## 2019-01-01 ENCOUNTER — Encounter (HOSPITAL_BASED_OUTPATIENT_CLINIC_OR_DEPARTMENT_OTHER): Payer: Self-pay

## 2019-01-01 SURGERY — FASCIECTOMY, PALM
Anesthesia: Regional | Laterality: Left

## 2019-02-08 NOTE — Assessment & Plan Note (Signed)
Cough has been doing much better and likely reflects combination of mild asthma, postnasal drip and reflux, all of which are under better control.  Okay to refill cough syrup very occasional use as discussed.

## 2019-02-08 NOTE — Assessment & Plan Note (Signed)
Emphasis on maintaining reflux precautions including sleep with head of bed elevated.

## 2019-02-27 ENCOUNTER — Other Ambulatory Visit: Payer: Self-pay

## 2019-02-27 ENCOUNTER — Ambulatory Visit (INDEPENDENT_AMBULATORY_CARE_PROVIDER_SITE_OTHER): Payer: Medicare Other | Admitting: Internal Medicine

## 2019-02-27 ENCOUNTER — Encounter: Payer: Self-pay | Admitting: Internal Medicine

## 2019-02-27 DIAGNOSIS — J4520 Mild intermittent asthma, uncomplicated: Secondary | ICD-10-CM | POA: Diagnosis not present

## 2019-02-27 DIAGNOSIS — K219 Gastro-esophageal reflux disease without esophagitis: Secondary | ICD-10-CM

## 2019-02-27 MED ORDER — BENZONATATE 200 MG PO CAPS: 200.0000 mg | ORAL_CAPSULE | Freq: Three times a day (TID) | ORAL | 1 refills | Status: DC | PRN

## 2019-02-27 NOTE — Progress Notes (Signed)
HPI F never smoker followed for allergic rhinitis, asthma, complicated by GERD, HBP, glaucoma  -------------------------------------------------------------------------------------- 12/04/2018- 73 year old female never smoker followed for allergic rhinitis, asthma, complicated by GERD, HBP, glaucoma, PE/ R DVT 02/22/18/ Xarelto/+ lupus anticoagulant Dr Marin Olp plans Xarelto for 1 year, then possibly aspirin. Advair 250, pro-air HFA, neb albuterol/ pulmicort, Mucinex DM,  Pulmonary embolism in March now being managed by hematology on Xarelto. Persistent dry cough.  Blames history of sinus infections but says now doing very well. Still occasional heartburn but much better than years ago-diet controlled.  Sleeps with head up on pillows. CTa chest 05/10/2018- IMPRESSION: 1. Interval resolution of acute pulmonary emboli. No evidence of interval embolus nor stigmata of chronic PE. 2. Atherosclerosis, including Aortic Atherosclerosis (ICD10-170.0) and coronary artery disease. Please note that although the presence of coronary artery calcium documents the presence of coronary artery disease, the severity of this disease and any potential stenosis cannot be assessed on this non-gated CT examination. Assessment for potential risk factor modification, dietary therapy or pharmacologic therapy may be warranted, if clinically indicated.  02/27/2019- 73 year old female never smoker followed for allergic rhinitis, asthma, complicated by GERD, HBP, glaucoma, PE/ R DVT 02/22/18/ Xarelto/+ lupus anticoagulant Dr Marin Olp plans Xarelto for 1 year, then possibly aspirin. Chronic cough attributed to mild asthma, postnasal drip, and reflux. Cough flared again in January after a series of URIs. Cough worst at night- can't lie flat. Little wheeze. Notices she chokes easily while eating and having some mid-esophageal food hang-up. Chewing and swallowing more carefully.   ROS- see HPI + = positive Constitutional:   No-    weight loss, night sweats, fevers, chills, fatigue, lassitude. HEENT:   No-  headaches, difficulty swallowing, tooth/dental problems, sore throat,       No-  sneezing, itching, ear ache,    +nasal congestion, no-post nasal drip,  CV:  No-   chest pain, orthopnea, PND, swelling in lower extremities, anasarca, dizziness, palpitations Resp: No-   shortness of breath with exertion or at rest.              No-  productive cough,  + non-productive cough,  No- coughing up of blood.              No-   change in color of mucus.  + wheezing.   Skin: No-   rash or lesions. GI:  No-   +heartburn, indigestion, no-abdominal pain, nausea, vomiting, GU: MS: +  joint pain or swelling.   Neuro-     nothing unusual Psych:  No- change in mood or affect. No- depression or anxiety.  No memory loss.  OBJ General- Alert, Oriented, Affect-appropriate, Distress- none acute, + overweight Skin- rash-none, lesions- none, excoriation- none Lymphadenopathy- none Head- atraumatic            Eyes- Gross vision intact, PERRLA, conjunctivae clear secretions            Ears- Hearing, canals-normal- minor wax.            Nose- turbinate edema, no-Septal dev, mucus, polyps, erosion, perforation             Throat- Mallampati II , mucosa clear , drainage- none seen, tonsils- atrophic ,   Neck- flexible , trachea midline, no stridor , thyroid nl, carotid no bruit Chest - symmetrical excursion , unlabored           Heart/CV- RRR , no murmur , no gallop  , no rub, nl s1 s2                           -  JVD- none , edema- none, stasis changes- none, varices- none           Lung- clear to P&A,  Cough+dry, dullness-none, rub- none, wheeze-none           Chest wall-  Abd-  Br/ Gen/ Rectal- Not done, not indicated Extrem- cyanosis- none, clubbing, none, atrophy- none, strength- nl. + Osteoarthritic deformity of fingers.              Neuro- grossly intact to observation

## 2019-02-27 NOTE — Patient Instructions (Signed)
Script sent for benzonatate perles (nonsedating) and you can also use Mucinex-DM for cough in the daytime.  Ok at night to use the narcotic cough syrup  Please ask your primary doctor to refer you to Gastroenterology for help with the trouble swallowing and the reflux.  Please call if we can help

## 2019-02-28 ENCOUNTER — Other Ambulatory Visit: Payer: Self-pay

## 2019-02-28 ENCOUNTER — Encounter: Payer: Self-pay | Admitting: Hematology & Oncology

## 2019-02-28 ENCOUNTER — Inpatient Hospital Stay: Payer: Medicare Other

## 2019-02-28 ENCOUNTER — Inpatient Hospital Stay: Payer: Medicare Other | Attending: Hematology & Oncology | Admitting: Hematology & Oncology

## 2019-02-28 ENCOUNTER — Telehealth: Payer: Self-pay | Admitting: Hematology & Oncology

## 2019-02-28 VITALS — BP 128/66 | HR 74 | Temp 97.7°F | Resp 18 | Ht 61.0 in | Wt 182.1 lb

## 2019-02-28 DIAGNOSIS — Z79811 Long term (current) use of aromatase inhibitors: Secondary | ICD-10-CM | POA: Diagnosis not present

## 2019-02-28 DIAGNOSIS — Z79899 Other long term (current) drug therapy: Secondary | ICD-10-CM | POA: Diagnosis not present

## 2019-02-28 DIAGNOSIS — D6862 Lupus anticoagulant syndrome: Secondary | ICD-10-CM | POA: Diagnosis present

## 2019-02-28 DIAGNOSIS — R760 Raised antibody titer: Secondary | ICD-10-CM

## 2019-02-28 DIAGNOSIS — Z7901 Long term (current) use of anticoagulants: Secondary | ICD-10-CM | POA: Insufficient documentation

## 2019-02-28 DIAGNOSIS — Z7951 Long term (current) use of inhaled steroids: Secondary | ICD-10-CM

## 2019-02-28 DIAGNOSIS — Z791 Long term (current) use of non-steroidal anti-inflammatories (NSAID): Secondary | ICD-10-CM

## 2019-02-28 DIAGNOSIS — I2699 Other pulmonary embolism without acute cor pulmonale: Secondary | ICD-10-CM

## 2019-02-28 DIAGNOSIS — I2609 Other pulmonary embolism with acute cor pulmonale: Secondary | ICD-10-CM

## 2019-02-28 LAB — CBC WITH DIFFERENTIAL (CANCER CENTER ONLY)
Abs Immature Granulocytes: 0.03 10*3/uL (ref 0.00–0.07)
Basophils Absolute: 0.1 10*3/uL (ref 0.0–0.1)
Basophils Relative: 1 %
Eosinophils Absolute: 0.2 10*3/uL (ref 0.0–0.5)
Eosinophils Relative: 2 %
HCT: 37.9 % (ref 36.0–46.0)
Hemoglobin: 12 g/dL (ref 12.0–15.0)
Immature Granulocytes: 0 %
Lymphocytes Relative: 51 %
Lymphs Abs: 4.9 10*3/uL — ABNORMAL HIGH (ref 0.7–4.0)
MCH: 28.8 pg (ref 26.0–34.0)
MCHC: 31.7 g/dL (ref 30.0–36.0)
MCV: 90.9 fL (ref 80.0–100.0)
Monocytes Absolute: 0.5 10*3/uL (ref 0.1–1.0)
Monocytes Relative: 5 %
NEUTROS PCT: 41 %
Neutro Abs: 3.9 10*3/uL (ref 1.7–7.7)
Platelet Count: 347 10*3/uL (ref 150–400)
RBC: 4.17 MIL/uL (ref 3.87–5.11)
RDW: 14.6 % (ref 11.5–15.5)
WBC Count: 9.6 10*3/uL (ref 4.0–10.5)
nRBC: 0 % (ref 0.0–0.2)

## 2019-02-28 LAB — CMP (CANCER CENTER ONLY)
ALK PHOS: 107 U/L (ref 38–126)
ALT: 16 U/L (ref 0–44)
AST: 14 U/L — ABNORMAL LOW (ref 15–41)
Albumin: 4.2 g/dL (ref 3.5–5.0)
Anion gap: 11 (ref 5–15)
BUN: 17 mg/dL (ref 8–23)
CALCIUM: 10 mg/dL (ref 8.9–10.3)
CO2: 26 mmol/L (ref 22–32)
Chloride: 105 mmol/L (ref 98–111)
Creatinine: 1.1 mg/dL — ABNORMAL HIGH (ref 0.44–1.00)
GFR, Est AFR Am: 58 mL/min — ABNORMAL LOW (ref >60)
GFR, Estimated: 50 mL/min — ABNORMAL LOW (ref >60)
Glucose, Bld: 114 mg/dL — ABNORMAL HIGH (ref 70–99)
Potassium: 4.3 mmol/L (ref 3.5–5.1)
Sodium: 142 mmol/L (ref 135–145)
Total Bilirubin: 0.7 mg/dL (ref 0.3–1.2)
Total Protein: 6.8 g/dL (ref 6.5–8.1)

## 2019-02-28 NOTE — Telephone Encounter (Signed)
sched 6 month follow up appt per MD 3/12 chart note. Mailed appt letter to patient 3/12

## 2019-02-28 NOTE — Assessment & Plan Note (Signed)
She continues her inhaled meds. She has no wheeze today.  I think her cough is mostly related now to GERD. Plan- cough syrup with cautions for alertness. Control measures for cough. Call for refills of bronchodilators when needed.

## 2019-02-28 NOTE — Progress Notes (Signed)
Hematology and Oncology Follow Up Visit  [name] [id number] May 30, 1946 73 y.o. 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 q day -- maintanence on 09/06/2018  EC ASA 81 mg po q day     Interim History:  [name] is back for follow-up.  She is doing okay.  She is on the maintenance Xarelto now.  She is having no problems with bleeding.  She still has a little bit of swelling in the right leg.  A Doppler that was done back in September 2019 still showed a small short segment thrombus in the right posterior tibial vein.  She has had no problems with cough.  No chest wall pain.  She has had no fever.  There has been no problems with her bowels or bladder.  She has had no headache.  Overall, her performance status is ECOG 1.   Medications:  Current Outpatient Medications:  .  ADVAIR DISKUS 250-50 MCG/DOSE AEPB, Inhale one puff into the lungs 2 (two) times daily., Disp: , Rfl: 2 .  albuterol (PROAIR HFA) 108 (90 BASE) MCG/ACT inhaler, Inhale 2 puffs into the lungs every 4 (four) hours as needed for wheezing or shortness of breath., Disp: 2 Inhaler, Rfl: prn .  albuterol (PROVENTIL) (2.5 MG/3ML) 0.083% nebulizer solution, Take 3 mLs (2.5 mg total) by nebulization every 6 (six) hours as needed for wheezing or shortness of breath., Disp: 1080 mL, Rfl: 3 .  ALPRAZolam (XANAX) 0.25 MG tablet, Take 1 tablet by mouth at bedtime as needed for anxiety. , Disp: , Rfl:  .  azelastine (ASTELIN) 0.1 % nasal spray, Place into both nostrils 2 (two) times daily. Use in each nostril as directed, Disp: , Rfl:  .  benzonatate (TESSALON) 200 MG capsule, Take 1 capsule (200 mg total) by mouth 3 (three) times daily as needed for cough., Disp: 30 capsule, Rfl: 1 .  budesonide (PULMICORT) 0.25 MG/2ML nebulizer solution, Take 2 mLs (0.25 mg total)  by nebulization 2 (two) times daily. Dx: J45.20, Disp: 120 mL, Rfl: 5 .  celecoxib (CELEBREX) 200 MG capsule, Take 200 mg by mouth daily., Disp: , Rfl:  .  desvenlafaxine (PRISTIQ) 100 MG 24 hr tablet, Take 100 mg by mouth daily., Disp: , Rfl:  .  dextromethorphan-guaiFENesin (MUCINEX DM) 30-600 MG 12hr tablet, Take 1 tablet by mouth 2 (two) times daily., Disp: 14 tablet, Rfl: 1 .  fluticasone (FLONASE) 50 MCG/ACT nasal spray, Place 2 sprays into both nostrils daily., Disp: 16 g, Rfl: 3 .  glucose monitoring kit (FREESTYLE) monitoring kit, 1 each by Does not apply route as needed for other. Dispense any model that is covered- dispense testing supplies for Q AC/ HS accuchecks- 1 month supply with one refil., Disp: 1 each, Rfl: 1 .  latanoprost (XALATAN) 0.005 % ophthalmic solution, Place 1 drop into both eyes at bedtime. , Disp: , Rfl: 2 .  levocetirizine (XYZAL) 5 MG tablet, Take 5 mg by mouth every evening., Disp: , Rfl:  .  levothyroxine (SYNTHROID, LEVOTHROID) 50 MCG tablet, Take 1 tablet by mouth daily., Disp: , Rfl:  .  losartan-hydrochlorothiazide (HYZAAR) 100-12.5 MG per tablet, Take 1 tablet by mouth daily.  , Disp: , Rfl:  .  Multiple Vitamin (MULTIVITAMIN) tablet, Take 1 tablet by mouth daily., Disp: , Rfl:  .  pantoprazole (PROTONIX) 40 MG tablet, TK 1 T  PO QD, Disp: , Rfl: 0 .  pregabalin (LYRICA) 75 MG capsule, Take 75 mg by mouth 2 (two) times daily., Disp: , Rfl:  .  ranitidine (ZANTAC) 300 MG tablet, , Disp: , Rfl:  .  rivaroxaban (XARELTO) 10 MG TABS tablet, Take 1 tablet (10 mg total) by mouth daily with supper., Disp: 30 tablet, Rfl: 12 .  rOPINIRole (REQUIP) 2 MG tablet, , Disp: , Rfl: 1 .  senna (SENOKOT) 8.6 MG TABS tablet, Take 8.6 mg by mouth daily., Disp: , Rfl:  .  venlafaxine (EFFEXOR) 75 MG tablet, Take 75 mg by mouth daily., Disp: , Rfl:  .  Vitamin D, Ergocalciferol, (DRISDOL) 50000 units CAPS capsule, Take 50,000 Units by mouth once a week., Disp: , Rfl:    Allergies:  Allergies  Allergen Reactions  . Naproxen Itching and Swelling    Past Medical History, Surgical history, Social history, and Family History were reviewed and updated.  Review of Systems: Review of Systems  Constitutional: Negative.   HENT:  Negative.   Eyes: Negative.   Respiratory: Negative.   Cardiovascular: Negative.   Gastrointestinal: Negative.   Endocrine: Negative.   Genitourinary: Negative.    Musculoskeletal: Negative.   Skin: Negative.   Neurological: Negative.   Hematological: Negative.   Psychiatric/Behavioral: Negative.     Physical Exam:  height is '5\' 1"'  (1.549 m) and weight is 182 lb 1.9 oz (82.6 kg). Her oral temperature is 97.7 F (36.5 C). Her blood pressure is 128/66 and her pulse is 74. Her respiration is 18 and oxygen saturation is 97%.   Wt Readings from Last 3 Encounters:  02/28/19 182 lb 1.9 oz (82.6 kg)  02/27/19 185 lb (83.9 kg)  12/04/18 181 lb 3.2 oz (82.2 kg)    Physical Exam Vitals signs reviewed.  HENT:     Head: Normocephalic and atraumatic.  Eyes:     Pupils: Pupils are equal, round, and reactive to light.  Neck:     Musculoskeletal: Normal range of motion.  Cardiovascular:     Rate and Rhythm: Normal rate and regular rhythm.     Heart sounds: Normal heart sounds.  Pulmonary:     Effort: Pulmonary effort is normal.     Breath sounds: Normal breath sounds.  Abdominal:     General: Bowel sounds are normal.     Palpations: Abdomen is soft.  Musculoskeletal: Normal range of motion.        General: No tenderness or deformity.  Lymphadenopathy:     Cervical: No cervical adenopathy.  Skin:    General: Skin is warm and dry.     Findings: No erythema or rash.  Neurological:     Mental Status: She is alert and oriented to person, place, and time.  Psychiatric:        Behavior: Behavior normal.        Thought Content: Thought content normal.        Judgment: Judgment normal.      Lab Results  Component Value  Date   WBC 9.6 02/28/2019   HGB 12.0 02/28/2019   HCT 37.9 02/28/2019   MCV 90.9 02/28/2019   PLT 347 02/28/2019     Chemistry      Component Value Date/Time   NA 142 02/28/2019 1008   NA 141 11/03/2015 1117   K 4.3 02/28/2019 1008   CL 105 02/28/2019 1008   CO2 26 02/28/2019 1008   BUN 17 02/28/2019 1008   BUN 22 11/03/2015 1117  CREATININE 1.10 (H) 02/28/2019 1008      Component Value Date/Time   CALCIUM 10.0 02/28/2019 1008   ALKPHOS 107 02/28/2019 1008   AST 14 (L) 02/28/2019 1008   ALT 16 02/28/2019 1008   BILITOT 0.7 02/28/2019 1008       Impression and Plan: [name] is a 73 year old white female with a pulmonary emboli and lower extremity thromboembolic disease.  She has a persistently positive lupus anticoagulant.  As such, I think we have to address this with baby aspirin while she is on Xarelto.  We will continue her Xarelto as a maintenance at a dose of 10 mg a day.  I will keep her on this for 1 year.  I will then think about getting her on to full dose aspirin because of the lupus anticoagulant.  I will plan to have her come back to see [place] probably in 6 months.  By then, she will be done with the Xarelto.Marland Kitchen    Volanda Napoleon, MD 3/12/20205:13 PM

## 2019-02-28 NOTE — Assessment & Plan Note (Signed)
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  Plan- She is going to contact her PCP for referral back to her GI conultant whose name she can't remember. Reflux precautions emphasized.

## 2019-03-01 LAB — BETA-2-GLYCOPROTEIN I ABS, IGG/M/A
Beta-2 Glyco I IgG: <9 GPI IgG units (ref 0–20)
Beta-2-Glycoprotein I IgA: <9 GPI IgA units (ref 0–25)
Beta-2-Glycoprotein I IgM: <9 GPI IgM units (ref 0–32)

## 2019-03-01 LAB — DRVVT MIX: dRVVT Mix: 57.4 s — ABNORMAL HIGH (ref 0.0–47.0)

## 2019-03-01 LAB — DRVVT CONFIRM: dRVVT Confirm: 1.2 ratio (ref 0.8–1.2)

## 2019-03-01 LAB — LUPUS ANTICOAGULANT PANEL
DRVVT: 59.1 s — ABNORMAL HIGH (ref 0.0–47.0)
PTT Lupus Anticoagulant: 27.8 s (ref 0.0–51.9)

## 2019-03-02 LAB — CARDIOLIPIN ANTIBODIES, IGG, IGM, IGA
Anticardiolipin IgA: <9 APL U/mL (ref 0–11)
Anticardiolipin IgG: <9 GPL U/mL (ref 0–14)
Anticardiolipin IgM: <9 MPL U/mL (ref 0–12)

## 2019-03-15 IMAGING — CT CT ANGIO CHEST
2 of 8 series · 18 of 36 positions shown · IV contrast (iopamidol)
Comparison: Chest x-ray 02/21/2018

CLINICAL DATA: Increased short of breath and chest pain

EXAM:
CT ANGIOGRAPHY CHEST WITH CONTRAST
TECHNIQUE: Multidetector CT imaging of the chest was performed using the
standard protocol during bolus administration of intravenous
contrast. Multiplanar CT image reconstructions and MIPs were
obtained to evaluate the vascular anatomy.
CONTRAST:  79 mL [id number] IOPAMIDOL ([id number]) INJECTION 76%

[Series 6: pe thins · axial · 0.76mm/px · z∈[-243,+15]mm · 17 of 290 slices shown]
[im 16/290  lung]
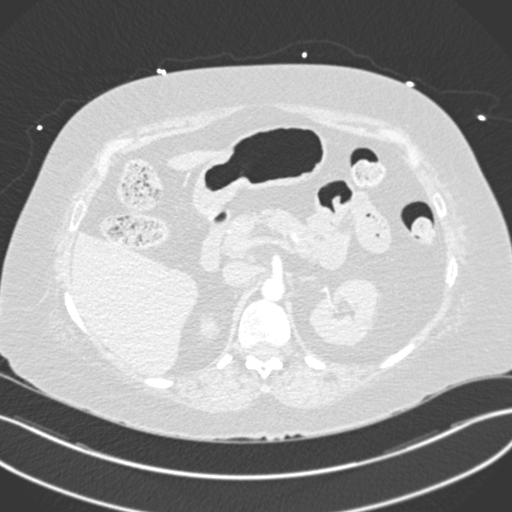
[im 31/290  mediastinal]
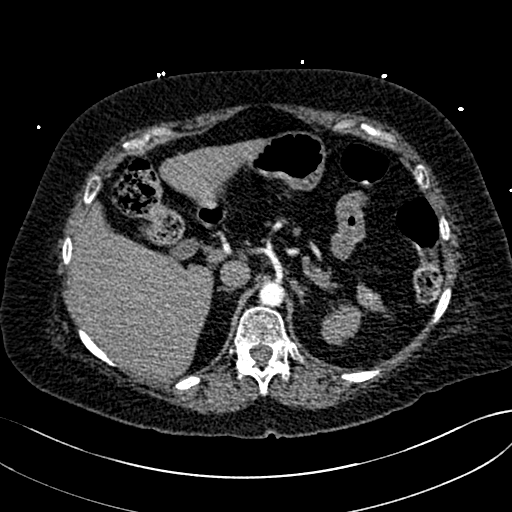
[im 46/290  lung]
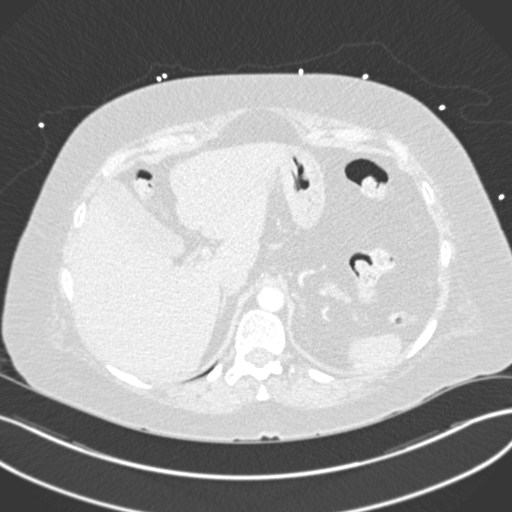
[im 61/290  mediastinal]
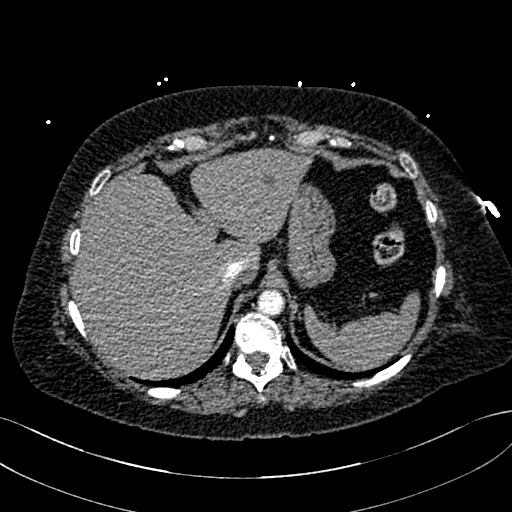
[im 77/290  lung]
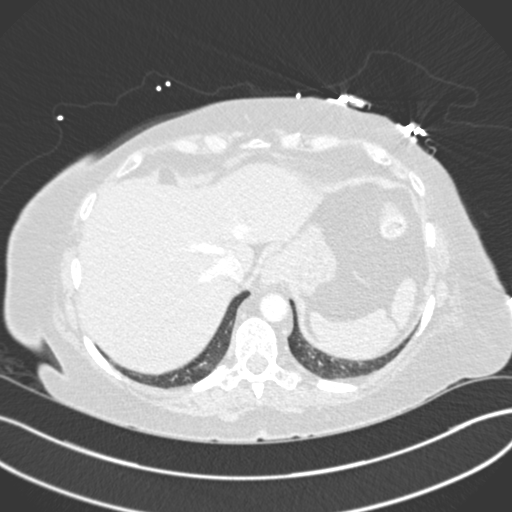
[im 92/290  mediastinal]
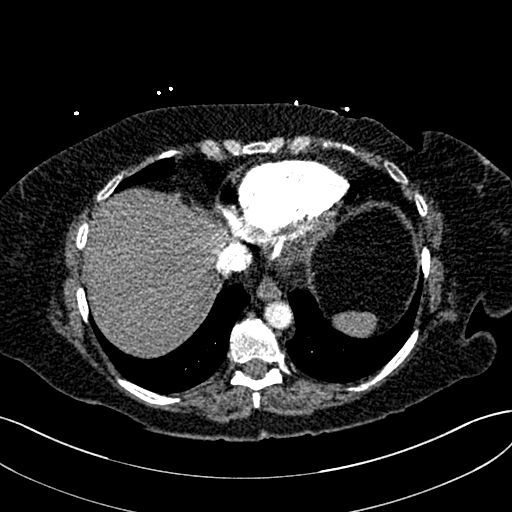
[im 107/290  lung]
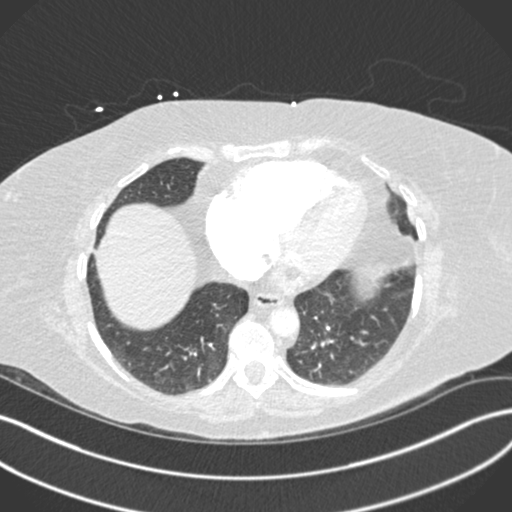
[im 122/290  mediastinal]
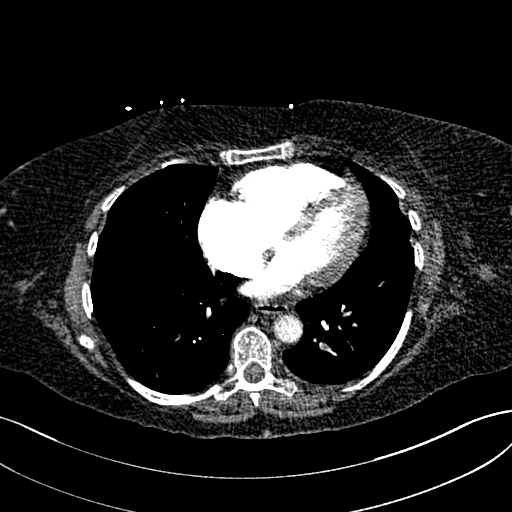
[im 153/290  lung]
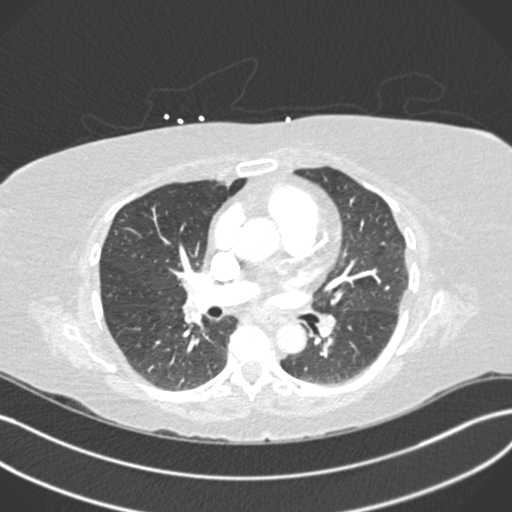
[im 168/290  mediastinal]
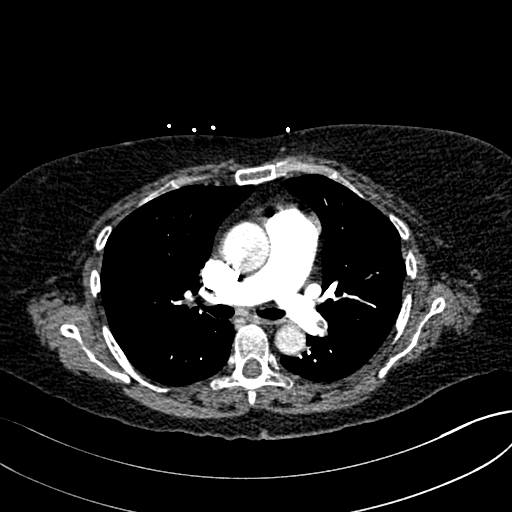
[im 183/290  lung]
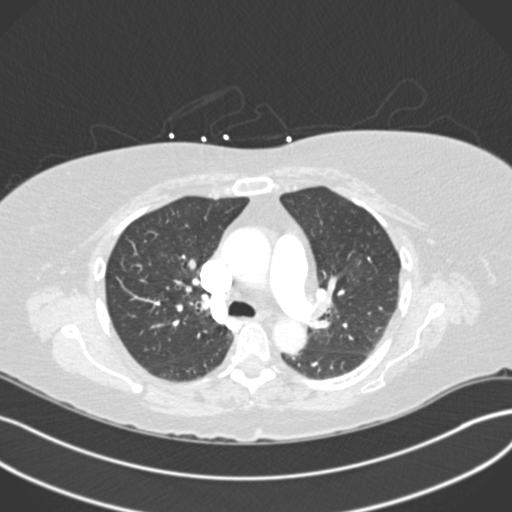
[im 198/290  mediastinal]
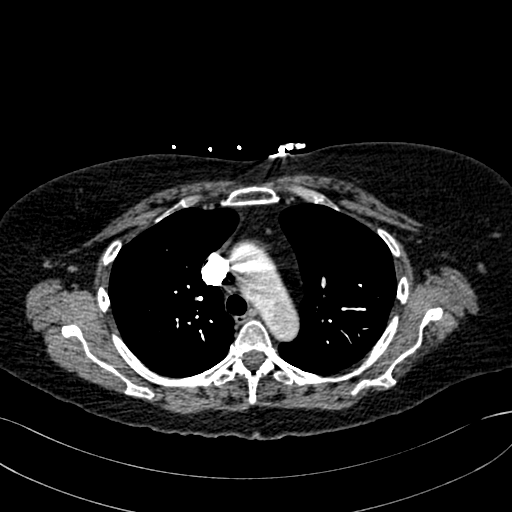
[im 213/290  lung]
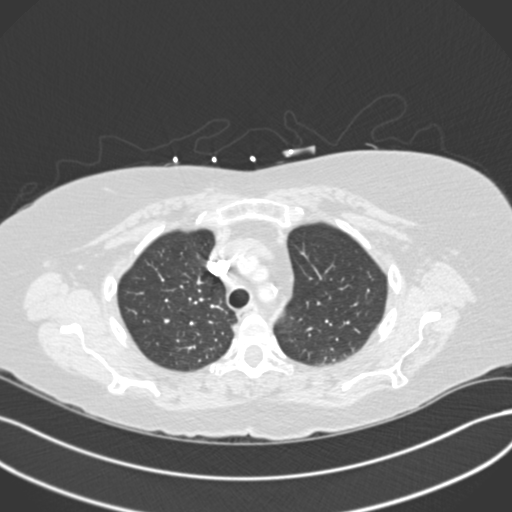
[im 229/290  mediastinal]
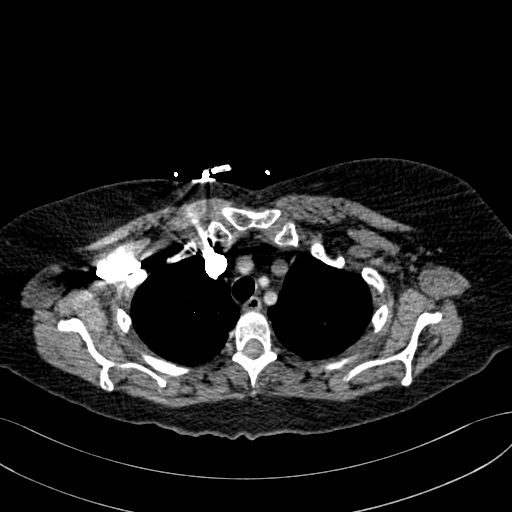
[im 244/290  lung]
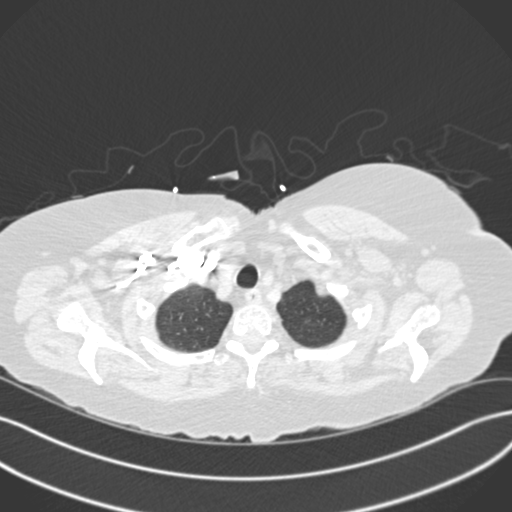
[im 259/290  mediastinal]
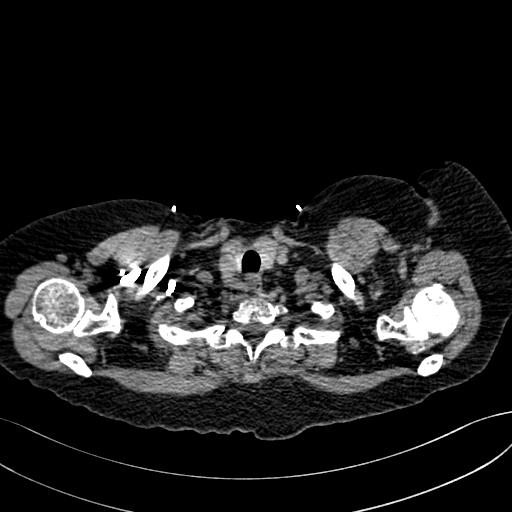
[im 274/290  lung]
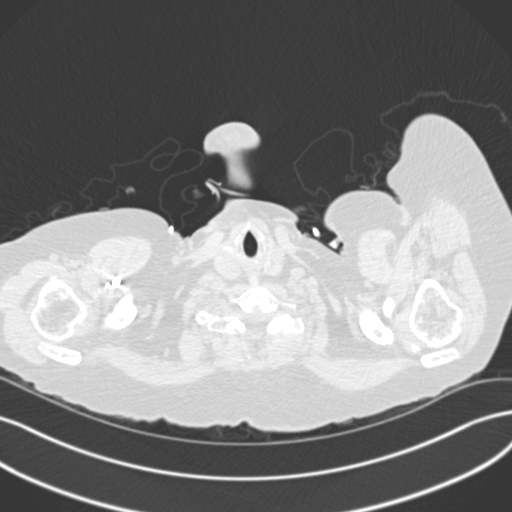

[Series 7: pe coronal mpr · coronal · 0.61mm/px · 1 of 151 slices shown]
[im 76/151  mediastinal]
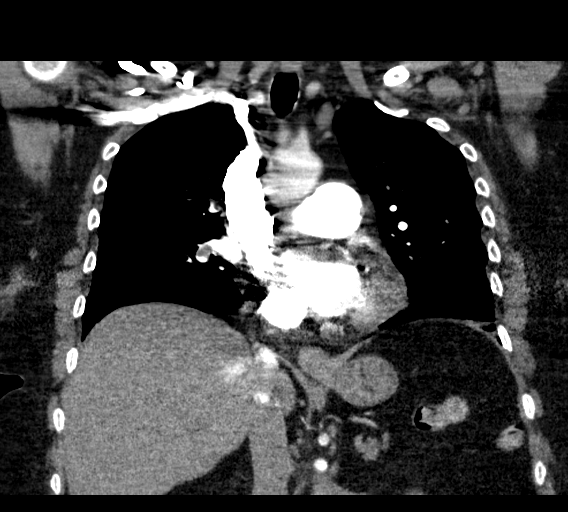

[18 of 36 positions shown; findings below may reference images not displayed]

FINDINGS: Cardiovascular: Satisfactory opacification of the pulmonary arteries
to the segmental level. Thrombus present within the distal right and
left pulmonary arteries with thrombus extending into the inter lobar
arteries an segmental and subsegmental arteries of the upper, lower,
and right middle lobes. RV LV ratio is positive at 1.8. Reflux of
contrast into the hepatic veins also consistent with right heart
strain. Heart size within normal limits. No pericardial effusion.
Mild aortic atherosclerosis. Coronary artery calcification.

Mediastinum/Nodes: No enlarged mediastinal, hilar, or axillary lymph
nodes. Thyroid gland, trachea, and esophagus demonstrate no
significant findings.

Lungs/Pleura: Lungs are clear. No pleural effusion or pneumothorax.

Upper Abdomen: Cyst in the left hepatic lobe. No acute abnormality
in the upper abdomen

Musculoskeletal: No chest wall abnormality. No acute or significant
osseous findings.

Review of the MIP images confirms the above findings.
IMPRESSION: 1. Findings consistent with acute bilateral pulmonary emboli
involving the distal right and left main pulmonary arteries with
extension of thrombus into the inter lobar arteries and segmental
and subsegmental branches of all lobes of the lung. Positive for
acute PE with CT evidence of right heart strain (RV/LV Ratio = 1.8)
consistent with at least submassive (intermediate risk) PE. The
presence of right heart strain has been associated with an increased
risk of morbidity and mortality. Please activate Code PE by paging
[phone].
2. Clear lung fields

Critical Value/emergent results were called by telephone at the time
of interpretation on 02/21/2018 at [DATE] to Dr. VISAGEN LOKHEERAM ,
who verbally acknowledged these results.

Aortic Atherosclerosis ([id number]-K9X.X).

## 2019-04-01 ENCOUNTER — Other Ambulatory Visit: Payer: Self-pay | Admitting: Internal Medicine

## 2019-05-06 ENCOUNTER — Encounter: Payer: Self-pay | Admitting: Internal Medicine

## 2019-05-06 ENCOUNTER — Other Ambulatory Visit: Payer: Self-pay

## 2019-05-06 ENCOUNTER — Ambulatory Visit (INDEPENDENT_AMBULATORY_CARE_PROVIDER_SITE_OTHER): Payer: Medicare Other

## 2019-05-06 ENCOUNTER — Ambulatory Visit (INDEPENDENT_AMBULATORY_CARE_PROVIDER_SITE_OTHER): Payer: Medicare Other | Admitting: Internal Medicine

## 2019-05-06 VITALS — BP 112/60 | HR 94 | Ht 61.75 in | Wt 192.6 lb

## 2019-05-06 DIAGNOSIS — I2699 Other pulmonary embolism without acute cor pulmonale: Secondary | ICD-10-CM | POA: Diagnosis not present

## 2019-05-06 DIAGNOSIS — J4540 Moderate persistent asthma, uncomplicated: Secondary | ICD-10-CM

## 2019-05-06 DIAGNOSIS — J4521 Mild intermittent asthma with (acute) exacerbation: Secondary | ICD-10-CM | POA: Diagnosis not present

## 2019-05-06 DIAGNOSIS — K219 Gastro-esophageal reflux disease without esophagitis: Secondary | ICD-10-CM | POA: Diagnosis not present

## 2019-05-06 MED ORDER — FLUTICASONE-UMECLIDIN-VILANT 100-62.5-25 MCG/INH IN AEPB: 1.0000 | INHALATION_SPRAY | Freq: Every day | RESPIRATORY_TRACT | 0 refills | Status: DC

## 2019-05-06 NOTE — Progress Notes (Signed)
HPI F never smoker followed for allergic rhinitis, asthma, complicated by GERD, HBP, glaucoma  --------------------------------------------------------------------------------------   02/27/2019- 73 year old female never smoker followed for allergic rhinitis, asthma, complicated by GERD, HBP, glaucoma, PE/ R DVT 02/22/18/ Xarelto/+ lupus anticoagulant Dr Marin Olp plans Xarelto for 1 year, then possibly aspirin. Chronic cough attributed to mild asthma, postnasal drip, and reflux. Cough flared again in January after a series of URIs. Cough worst at night- can't lie flat. Little wheeze. Notices she chokes easily while eating and having some mid-esophageal food hang-up. Chewing and swallowing more carefully.   518/2020-  73 year old female never smoker followed for allergic rhinitis, asthma, complicated by GERD, HBP, Glaucoma, PE/ R DVT 02/22/18/ Xarelto/+ lupus anticoagulant, CKD3 Dr Marin Olp plans Xarelto for 1 year, then possibly aspirin. Has seen [facility] for dysphagia. Normal office spirometry [place] 02/18/2019 FEV1 1.89/ 96%, FVC 2.21/ 85%, FEV1/FVC 0.85/ 113% ------pt states she's had increased SOB w/ exertion since beginning of springtime w/ cough and occasional thin-clear mucus Neb albuterol/ pulmicort, Advair 250, albuterol hfa Easy DOE w weight gain- little exercise between Covid restriction and her knee braces. Pending TKR. Rare wheeze. Uses neb 1-2x/ week, rescue 1-2x/ week. 2-3 pillows. Cough as lies down. Eos WNL. Chokes easily on pills, w/o hang-up.   ROS- see HPI + = positive Constitutional:   No-   weight loss, night sweats, fevers, chills, fatigue, lassitude. HEENT:   No-  headaches, difficulty swallowing, tooth/dental problems, sore throat,       No-  sneezing, itching, ear ache,    +nasal congestion, no-post nasal drip,  CV:  No-   chest pain, orthopnea, PND, swelling in lower extremities, anasarca, dizziness, palpitations Resp: No-   shortness of breath with exertion or at  rest.              No-  productive cough,  + non-productive cough,  No- coughing up of blood.              No-   change in color of mucus.  + wheezing.   Skin: No-   rash or lesions. GI:  No-   +heartburn, indigestion, no-abdominal pain, nausea, vomiting, GU: MS: +  joint pain or swelling.   Neuro-     nothing unusual Psych:  No- change in mood or affect. No- depression or anxiety.  No memory loss.  OBJ General- Alert, Oriented, Affect-appropriate, Distress- none acute, + [place] Skin- rash-none, lesions- none, excoriation- none Lymphadenopathy- none Head- atraumatic            Eyes- Gross vision intact, PERRLA, conjunctivae clear secretions            Ears- Hearing, canals-normal- minor wax.            Nose- turbinate edema, no-Septal dev, mucus, polyps, erosion, perforation             Throat- Mallampati II , mucosa clear , drainage- none seen, tonsils- atrophic ,   Neck- flexible , trachea midline, no stridor , thyroid nl, carotid no bruit Chest - symmetrical excursion , unlabored           Heart/CV- RRR , no murmur , no gallop  , no rub, nl s1 s2                           - JVD- none , edema- none, stasis changes- none, varices- none           Lung-  clear to P&A,  Cough-none, dullness-none, rub- none, wheeze-none           Chest wall-  Abd-  Br/ Gen/ Rectal- Not done, not indicated Extrem- cyanosis- none, clubbing, none, atrophy- none, strength- nl., Knee braces+Osteoarthritic deformity of fingers.              Neuro- grossly intact to observation

## 2019-05-06 NOTE — Patient Instructions (Signed)
Order- CXR  Dx asthma moderate persistent, cough, hx PE  Sample Trelegy inhaler     Inhale 1 puff, once daily   Try this instead of Advair for 1 week. When the sample is used up, go back to Advair  Weight loss will help your breathing, as you are able.   Please call as needed

## 2019-05-15 ENCOUNTER — Telehealth: Payer: Self-pay | Admitting: Internal Medicine

## 2019-05-15 NOTE — Telephone Encounter (Signed)
Called to triage. Pt saw CY 2 weeks ago. She was started on Trelegy in which she did not stay on. She had a colonoscopy on yesterday and had to d/c xarelto. She started back today on xarelto. She says she just get so winded with activity. She will re-try Trelegy encouraged 1 puff daily until finished.  Reminded to rinse mouth out after each use in which she had not been doing. She will call back next week if not feeling improved to move appt up to see np. Nothing further needed.

## 2019-05-21 ENCOUNTER — Encounter: Payer: Self-pay | Admitting: Hematology & Oncology

## 2019-05-21 ENCOUNTER — Encounter (HOSPITAL_BASED_OUTPATIENT_CLINIC_OR_DEPARTMENT_OTHER): Payer: Self-pay

## 2019-05-21 ENCOUNTER — Other Ambulatory Visit: Payer: Self-pay

## 2019-05-21 ENCOUNTER — Inpatient Hospital Stay: Payer: Medicare Other | Attending: Hematology & Oncology

## 2019-05-21 ENCOUNTER — Telehealth: Payer: Self-pay | Admitting: Hematology & Oncology

## 2019-05-21 ENCOUNTER — Inpatient Hospital Stay (HOSPITAL_BASED_OUTPATIENT_CLINIC_OR_DEPARTMENT_OTHER): Payer: Medicare Other | Admitting: Hematology & Oncology

## 2019-05-21 ENCOUNTER — Telehealth: Payer: Self-pay | Admitting: *Deleted

## 2019-05-21 ENCOUNTER — Ambulatory Visit (HOSPITAL_BASED_OUTPATIENT_CLINIC_OR_DEPARTMENT_OTHER)
Admission: RE | Admit: 2019-05-21 | Discharge: 2019-05-21 | Disposition: A | Discharge status: Home / Self Care | Payer: Medicare Other | Source: Ambulatory Visit | Attending: Hematology & Oncology | Admitting: Hematology & Oncology

## 2019-05-21 VITALS — BP 145/62 | HR 78 | Temp 97.7°F | Ht 61.75 in | Wt 187.4 lb

## 2019-05-21 DIAGNOSIS — Z791 Long term (current) use of non-steroidal anti-inflammatories (NSAID): Secondary | ICD-10-CM

## 2019-05-21 DIAGNOSIS — D6862 Lupus anticoagulant syndrome: Secondary | ICD-10-CM

## 2019-05-21 DIAGNOSIS — J449 Chronic obstructive pulmonary disease, unspecified: Secondary | ICD-10-CM | POA: Insufficient documentation

## 2019-05-21 DIAGNOSIS — M25562 Pain in left knee: Secondary | ICD-10-CM | POA: Diagnosis not present

## 2019-05-21 DIAGNOSIS — Z7901 Long term (current) use of anticoagulants: Secondary | ICD-10-CM

## 2019-05-21 DIAGNOSIS — M329 Systemic lupus erythematosus, unspecified: Secondary | ICD-10-CM | POA: Diagnosis not present

## 2019-05-21 DIAGNOSIS — I2609 Other pulmonary embolism with acute cor pulmonale: Secondary | ICD-10-CM

## 2019-05-21 DIAGNOSIS — Z7951 Long term (current) use of inhaled steroids: Secondary | ICD-10-CM

## 2019-05-21 DIAGNOSIS — Z79899 Other long term (current) drug therapy: Secondary | ICD-10-CM | POA: Diagnosis not present

## 2019-05-21 DIAGNOSIS — Z86711 Personal history of pulmonary embolism: Secondary | ICD-10-CM | POA: Diagnosis not present

## 2019-05-21 DIAGNOSIS — J4520 Mild intermittent asthma, uncomplicated: Secondary | ICD-10-CM | POA: Diagnosis not present

## 2019-05-21 DIAGNOSIS — I2699 Other pulmonary embolism without acute cor pulmonale: Secondary | ICD-10-CM

## 2019-05-21 LAB — CMP (CANCER CENTER ONLY)
ALT: 15 U/L (ref 0–44)
AST: 14 U/L — ABNORMAL LOW (ref 15–41)
Albumin: 4.2 g/dL (ref 3.5–5.0)
Alkaline Phosphatase: 94 U/L (ref 38–126)
Anion gap: 9 (ref 5–15)
BUN: 26 mg/dL — ABNORMAL HIGH (ref 8–23)
CO2: 27 mmol/L (ref 22–32)
Calcium: 9.2 mg/dL (ref 8.9–10.3)
Chloride: 104 mmol/L (ref 98–111)
Creatinine: 1.44 mg/dL — ABNORMAL HIGH (ref 0.44–1.00)
GFR, Est AFR Am: 42 mL/min — ABNORMAL LOW (ref >60)
GFR, Estimated: 36 mL/min — ABNORMAL LOW (ref >60)
Glucose, Bld: 118 mg/dL — ABNORMAL HIGH (ref 70–99)
Potassium: 5 mmol/L (ref 3.5–5.1)
Sodium: 140 mmol/L (ref 135–145)
Total Bilirubin: 0.4 mg/dL (ref 0.3–1.2)
Total Protein: 6.9 g/dL (ref 6.5–8.1)

## 2019-05-21 LAB — CBC WITH DIFFERENTIAL (CANCER CENTER ONLY)
Abs Immature Granulocytes: 0.02 10*3/uL (ref 0.00–0.07)
Basophils Absolute: 0.1 10*3/uL (ref 0.0–0.1)
Basophils Relative: 1 %
Eosinophils Absolute: 0.2 10*3/uL (ref 0.0–0.5)
Eosinophils Relative: 3 %
HCT: 36.7 % (ref 36.0–46.0)
Hemoglobin: 11.7 g/dL — ABNORMAL LOW (ref 12.0–15.0)
Immature Granulocytes: 0 %
Lymphocytes Relative: 42 %
Lymphs Abs: 3.4 10*3/uL (ref 0.7–4.0)
MCH: 27.9 pg (ref 26.0–34.0)
MCHC: 31.9 g/dL (ref 30.0–36.0)
MCV: 87.4 fL (ref 80.0–100.0)
Monocytes Absolute: 0.6 10*3/uL (ref 0.1–1.0)
Monocytes Relative: 8 %
Neutro Abs: 3.7 10*3/uL (ref 1.7–7.7)
Neutrophils Relative %: 46 %
Platelet Count: 338 10*3/uL (ref 150–400)
RBC: 4.2 MIL/uL (ref 3.87–5.11)
RDW: 14 % (ref 11.5–15.5)
WBC Count: 8 10*3/uL (ref 4.0–10.5)
nRBC: 0 % (ref 0.0–0.2)

## 2019-05-21 LAB — D-DIMER, QUANTITATIVE: D-Dimer, Quant: 1.24 ug/mL-FEU — ABNORMAL HIGH (ref 0.00–0.50)

## 2019-05-21 MED ORDER — IOHEXOL 350 MG/ML SOLN
100.0000 mL | Freq: Once | INTRAVENOUS | Status: AC | PRN
  Administered 2019-05-21: 80 mL via INTRAVENOUS

## 2019-05-21 NOTE — Progress Notes (Signed)
Hematology and Oncology Follow Up Visit  [name] [id number] 07-23-1946 73 y.o. 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 q day -- maintanence on 09/06/2018  EC ASA 81 mg po q day     Interim History:  [name] is back for follow-up.  She is having some complaints of worsening shortness of breath.  I know that she has some underlying COPD.  She is on inhalers.  Given the fact that she has had a pulmonary embolism in the past, I think it would be worthwhile checking her again.  Her last CT angiogram was a year ago in May.  Everything looked fine with resolution of the pulmonary emboli that she had.  I know she does have atherosclerotic disease.  If her CT angiogram is normal, she may need to see her cardiologist.  She is still on baby aspirin.  She also on low-dose Xarelto.  She has had no productive cough.  There is been no fever.  She has had no leg pain or swelling.  She has horrible knees and has pain in the left knee.  She is going to need surgery for this.  Overall, her performance status is ECOG 1.   Medications:  Current Outpatient Medications:  .  ADVAIR DISKUS 250-50 MCG/DOSE AEPB, Inhale one puff into the lungs 2 (two) times daily., Disp: , Rfl: 2 .  albuterol (PROAIR HFA) 108 (90 BASE) MCG/ACT inhaler, Inhale 2 puffs into the lungs every 4 (four) hours as needed for wheezing or shortness of breath., Disp: 2 Inhaler, Rfl: prn .  albuterol (PROVENTIL) (2.5 MG/3ML) 0.083% nebulizer solution, Take 3 mLs (2.5 mg total) by nebulization every 6 (six) hours as needed for wheezing or shortness of breath., Disp: 1080 mL, Rfl: 3 .  ALPRAZolam (XANAX) 0.25 MG tablet, Take 1 tablet by mouth at bedtime as needed for anxiety. , Disp: , Rfl:  .  azelastine (ASTELIN) 0.1 % nasal spray, Place into both nostrils 2 (two) times  daily. Use in each nostril as directed, Disp: , Rfl:  .  benzonatate (TESSALON) 200 MG capsule, Take 1 capsule (200 mg total) by mouth 3 (three) times daily as needed for cough., Disp: 30 capsule, Rfl: 1 .  budesonide (PULMICORT) 0.25 MG/2ML nebulizer solution, Take 2 mLs (0.25 mg total) by nebulization 2 (two) times daily. Dx: J45.20, Disp: 120 mL, Rfl: 5 .  celecoxib (CELEBREX) 200 MG capsule, Take 200 mg by mouth daily., Disp: , Rfl:  .  desvenlafaxine (PRISTIQ) 100 MG 24 hr tablet, Take 100 mg by mouth daily., Disp: , Rfl:  .  dextromethorphan-guaiFENesin (MUCINEX DM) 30-600 MG 12hr tablet, Take 1 tablet by mouth 2 (two) times daily., Disp: 14 tablet, Rfl: 1 .  fluticasone (FLONASE) 50 MCG/ACT nasal spray, Place 2 sprays into both nostrils daily., Disp: 16 g, Rfl: 3 .  Fluticasone-Umeclidin-Vilant (TRELEGY ELLIPTA) 100-62.5-25 MCG/INH AEPB, Inhale 1 puff into the lungs daily., Disp: 1 each, Rfl: 0 .  glucose monitoring kit (FREESTYLE) monitoring kit, 1 each by Does not apply route as needed for other. Dispense any model that is covered- dispense testing supplies for Q AC/ HS accuchecks- 1 month supply with one refil., Disp: 1 each, Rfl: 1 .  latanoprost (XALATAN) 0.005 % ophthalmic solution, Place 1 drop into both eyes at bedtime. , Disp: , Rfl: 2 .  levocetirizine (XYZAL) 5  MG tablet, Take 5 mg by mouth every evening., Disp: , Rfl:  .  levothyroxine (SYNTHROID, LEVOTHROID) 50 MCG tablet, Take 1 tablet by mouth daily., Disp: , Rfl:  .  losartan-hydrochlorothiazide (HYZAAR) 100-12.5 MG per tablet, Take 1 tablet by mouth daily.  , Disp: , Rfl:  .  Multiple Vitamin (MULTIVITAMIN) tablet, Take 1 tablet by mouth daily., Disp: , Rfl:  .  pantoprazole (PROTONIX) 40 MG tablet, TK 1 T PO QD, Disp: , Rfl: 0 .  pregabalin (LYRICA) 75 MG capsule, Take 75 mg by mouth 2 (two) times daily., Disp: , Rfl:  .  ranitidine (ZANTAC) 300 MG tablet, , Disp: , Rfl:  .  rivaroxaban (XARELTO) 10 MG TABS tablet, Take 1  tablet (10 mg total) by mouth daily with supper., Disp: 30 tablet, Rfl: 12 .  rOPINIRole (REQUIP) 2 MG tablet, , Disp: , Rfl: 1 .  senna (SENOKOT) 8.6 MG TABS tablet, Take 8.6 mg by mouth daily., Disp: , Rfl:  .  venlafaxine (EFFEXOR) 75 MG tablet, Take 75 mg by mouth daily., Disp: , Rfl:  .  Vitamin D, Ergocalciferol, (DRISDOL) 50000 units CAPS capsule, Take 50,000 Units by mouth once a week., Disp: , Rfl:   Allergies:  Allergies  Allergen Reactions  . Naproxen Itching and Swelling    Past Medical History, Surgical history, Social history, and Family History were reviewed and updated.  Review of Systems: Review of Systems  Constitutional: Negative.   HENT:  Negative.   Eyes: Negative.   Respiratory: Negative.   Cardiovascular: Negative.   Gastrointestinal: Negative.   Endocrine: Negative.   Genitourinary: Negative.    Musculoskeletal: Negative.   Skin: Negative.   Neurological: Negative.   Hematological: Negative.   Psychiatric/Behavioral: Negative.     Physical Exam:  height is 5' 1.75" (1.568 m) and weight is 187 lb 6.4 oz (85 kg). Her oral temperature is 97.7 F (36.5 C). Her blood pressure is 145/62 (abnormal) and her pulse is 78. Her oxygen saturation is 99%.   Wt Readings from Last 3 Encounters:  05/21/19 187 lb 6.4 oz (85 kg)  05/06/19 192 lb 9.6 oz (87.4 kg)  02/28/19 182 lb 1.9 oz (82.6 kg)    Physical Exam Vitals signs reviewed.  HENT:     Head: Normocephalic and atraumatic.  Eyes:     Pupils: Pupils are equal, round, and reactive to light.  Neck:     Musculoskeletal: Normal range of motion.  Cardiovascular:     Rate and Rhythm: Normal rate and regular rhythm.     Heart sounds: Normal heart sounds.  Pulmonary:     Effort: Pulmonary effort is normal.     Breath sounds: Normal breath sounds.  Abdominal:     General: Bowel sounds are normal.     Palpations: Abdomen is soft.  Musculoskeletal: Normal range of motion.        General: No tenderness or  deformity.  Lymphadenopathy:     Cervical: No cervical adenopathy.  Skin:    General: Skin is warm and dry.     Findings: No erythema or rash.  Neurological:     Mental Status: She is alert and oriented to person, place, and time.  Psychiatric:        Behavior: Behavior normal.        Thought Content: Thought content normal.        Judgment: Judgment normal.      Lab Results  Component Value Date   WBC 8.0 05/21/2019  HGB 11.7 (L) 05/21/2019   HCT 36.7 05/21/2019   MCV 87.4 05/21/2019   PLT 338 05/21/2019     Chemistry      Component Value Date/Time   NA 140 05/21/2019 0750   NA 141 11/03/2015 1117   K 5.0 05/21/2019 0750   CL 104 05/21/2019 0750   CO2 27 05/21/2019 0750   BUN 26 (H) 05/21/2019 0750   BUN 22 11/03/2015 1117   CREATININE 1.44 (H) 05/21/2019 0750      Component Value Date/Time   CALCIUM 9.2 05/21/2019 0750   ALKPHOS 94 05/21/2019 0750   AST 14 (L) 05/21/2019 0750   ALT 15 05/21/2019 0750   BILITOT 0.4 05/21/2019 0750       Impression and Plan: [name] is a 73 year old white female with a pulmonary emboli and lower extremity thromboembolic disease.  She has a persistently positive lupus anticoagulant.   We will see what the CT angiogram shows.  Again, if this is negative, she may need to see her cardiologist.  We will continue her on the Xarelto and the aspirin.  I will plan to see her back hopefully in about 4 weeks just for close follow-up.   Volanda Napoleon, MD 6/2/20209:32 AM

## 2019-05-21 NOTE — Telephone Encounter (Signed)
lmom to inform pt of 6/30 appt at [facility] per 6/2 los

## 2019-05-21 NOTE — Telephone Encounter (Addendum)
-----   Message from Anita Napoleon, MD sent at 05/21/2019 11:10 AM EDT ----- Called patient.  NO blood clot in your lungs per Dr. Marin Olp !!  If the SOB continues, you need to see your lung MD.  Hanley Seamen message to patient

## 2019-05-30 ENCOUNTER — Encounter: Payer: Self-pay | Admitting: Internal Medicine

## 2019-05-30 ENCOUNTER — Ambulatory Visit (INDEPENDENT_AMBULATORY_CARE_PROVIDER_SITE_OTHER): Payer: Medicare Other | Admitting: Internal Medicine

## 2019-05-30 ENCOUNTER — Other Ambulatory Visit: Payer: Self-pay

## 2019-05-30 VITALS — BP 110/58 | HR 86 | Temp 97.7°F | Ht 62.0 in | Wt 188.2 lb

## 2019-05-30 DIAGNOSIS — K219 Gastro-esophageal reflux disease without esophagitis: Secondary | ICD-10-CM

## 2019-05-30 DIAGNOSIS — J4521 Mild intermittent asthma with (acute) exacerbation: Secondary | ICD-10-CM

## 2019-05-30 DIAGNOSIS — R0609 Other forms of dyspnea: Secondary | ICD-10-CM | POA: Diagnosis not present

## 2019-05-30 MED ORDER — TRELEGY ELLIPTA 100-62.5-25 MCG/INH IN AEPB: 1.0000 | INHALATION_SPRAY | Freq: Every day | RESPIRATORY_TRACT | 0 refills | Status: DC

## 2019-05-30 MED ORDER — TRELEGY ELLIPTA 100-62.5-25 MCG/INH IN AEPB: 1.0000 | INHALATION_SPRAY | Freq: Every day | RESPIRATORY_TRACT | 12 refills | Status: DC

## 2019-05-30 NOTE — Progress Notes (Signed)
HPI F never smoker followed for allergic rhinitis, asthma, complicated by GERD, HBP, glaucoma  --------------------------------------------------------------------------------------  518/2020-  73 year old female never smoker followed for allergic rhinitis, asthma, complicated by GERD, HBP, glaucoma, PE/ R DVT 02/22/18/ Xarelto/+ lupus anticoagulant, CKD3 Dr Marin Olp plans Xarelto for 1 year, then possibly aspirin. Has seen [facility] for dysphagia. Normal office spirometry [place] 02/18/2019 FEV1 1.89/ 96%, FVC 2.21/ 85%, FEV1/FVC 0.85/ 113% ------pt states she's had increased SOB w/ exertion since beginning of springtime w/ cough and occasional thin-clear mucus Neb albuterol/ pulmicort, Advair 250, albuterol hfa  05/30/2019- 73 year old female never smoker followed for allergic rhinitis, asthma, complicated by GERD, HBP, glaucoma, PE/ R DVT 02/22/18/ Xarelto/+ lupus anticoagulant, CKD3 -----Feels like has increased SOB with exertion.States she is realizing she is holding breath alot. Cough has improved. Has some labored breathing during recent endoscopy.. Only needing rescue inhaler 1-2 times a week. Agrees to PFT  ROS- see HPI + = positive Constitutional:   No-   weight loss, night sweats, fevers, chills, fatigue, lassitude. HEENT:   No-  headaches, difficulty swallowing, tooth/dental problems, sore throat,       No-  sneezing, itching, ear ache,    +nasal congestion, no-post nasal drip,  CV:  No-   chest pain, orthopnea, PND, swelling in lower extremities, anasarca, dizziness, palpitations Resp: +  shortness of breath with exertion or at rest.              No-  productive cough,  + non-productive cough,  No- coughing up of blood.              No-   change in color of mucus.  + wheezing.   Skin: No-   rash or lesions. GI:  No-   +heartburn, indigestion, no-abdominal pain, nausea, vomiting, GU: MS: +  joint pain or swelling.   Neuro-     nothing unusual Psych:  No- change in mood or  affect. No- depression or anxiety.  No memory loss.  OBJ General- Alert, Oriented, Affect-appropriate, Distress- none acute, + overweight Skin- rash-none, lesions- none, excoriation- none Lymphadenopathy- none Head- atraumatic            Eyes- Gross vision intact, PERRLA, conjunctivae clear secretions            Ears- Hearing, canals-normal- minor wax.            Nose- turbinate edema, no-Septal dev, mucus, polyps, erosion, perforation             Throat- Mallampati II , mucosa clear , drainage- none seen, tonsils- atrophic ,   Neck- flexible , trachea midline, no stridor , thyroid nl, carotid no bruit Chest - symmetrical excursion , unlabored           Heart/CV- RRR , no murmur , no gallop  , no rub, nl s1 s2                           - JVD- none , edema- none, stasis changes- none, varices- none           Lung- clear to P&A,  Cough+dry, dullness-none, rub- none, wheeze-none           Chest wall-  Abd-  Br/ Gen/ Rectal- Not done, not indicated Extrem- cyanosis- none, clubbing, none, atrophy- none, strength- nl. + Osteoarthritic deformity of fingers.              Neuro- grossly intact to  observation

## 2019-05-30 NOTE — Patient Instructions (Signed)
Sample Trelegy inhaler   inhale 1 puff, then inse mouth, once daily Use this instead of Advair Script sent to drug store  Order- schedule PFT  Dx dyspnea on exertion  Please call as needed

## 2019-06-05 ENCOUNTER — Ambulatory Visit: Payer: Self-pay | Admitting: Internal Medicine

## 2019-06-17 ENCOUNTER — Other Ambulatory Visit: Payer: Self-pay | Admitting: Physician Assistant

## 2019-06-17 DIAGNOSIS — Z1231 Encounter for screening mammogram for malignant neoplasm of breast: Secondary | ICD-10-CM

## 2019-06-18 ENCOUNTER — Inpatient Hospital Stay (HOSPITAL_BASED_OUTPATIENT_CLINIC_OR_DEPARTMENT_OTHER): Payer: Medicare Other | Admitting: Hematology & Oncology

## 2019-06-18 ENCOUNTER — Inpatient Hospital Stay: Payer: Medicare Other

## 2019-06-18 ENCOUNTER — Other Ambulatory Visit: Payer: Self-pay

## 2019-06-18 VITALS — BP 137/79 | HR 82 | Temp 97.7°F | Resp 18

## 2019-06-18 DIAGNOSIS — Z79899 Other long term (current) drug therapy: Secondary | ICD-10-CM

## 2019-06-18 DIAGNOSIS — M329 Systemic lupus erythematosus, unspecified: Secondary | ICD-10-CM | POA: Diagnosis not present

## 2019-06-18 DIAGNOSIS — Z791 Long term (current) use of non-steroidal anti-inflammatories (NSAID): Secondary | ICD-10-CM

## 2019-06-18 DIAGNOSIS — J4520 Mild intermittent asthma, uncomplicated: Secondary | ICD-10-CM | POA: Diagnosis not present

## 2019-06-18 DIAGNOSIS — I2699 Other pulmonary embolism without acute cor pulmonale: Secondary | ICD-10-CM

## 2019-06-18 DIAGNOSIS — J449 Chronic obstructive pulmonary disease, unspecified: Secondary | ICD-10-CM | POA: Diagnosis not present

## 2019-06-18 DIAGNOSIS — M25562 Pain in left knee: Secondary | ICD-10-CM

## 2019-06-18 DIAGNOSIS — D6862 Lupus anticoagulant syndrome: Secondary | ICD-10-CM | POA: Diagnosis not present

## 2019-06-18 DIAGNOSIS — Z7901 Long term (current) use of anticoagulants: Secondary | ICD-10-CM

## 2019-06-18 DIAGNOSIS — Z7951 Long term (current) use of inhaled steroids: Secondary | ICD-10-CM

## 2019-06-18 DIAGNOSIS — Z86711 Personal history of pulmonary embolism: Secondary | ICD-10-CM

## 2019-06-18 LAB — CMP (CANCER CENTER ONLY)
ALT: 13 U/L (ref 0–44)
AST: 13 U/L — ABNORMAL LOW (ref 15–41)
Albumin: 4 g/dL (ref 3.5–5.0)
Alkaline Phosphatase: 90 U/L (ref 38–126)
Anion gap: 8 (ref 5–15)
BUN: 15 mg/dL (ref 8–23)
CO2: 28 mmol/L (ref 22–32)
Calcium: 9.6 mg/dL (ref 8.9–10.3)
Chloride: 105 mmol/L (ref 98–111)
Creatinine: 1.09 mg/dL — ABNORMAL HIGH (ref 0.44–1.00)
GFR, Est AFR Am: 59 mL/min — ABNORMAL LOW (ref >60)
GFR, Estimated: 51 mL/min — ABNORMAL LOW (ref >60)
Glucose, Bld: 122 mg/dL — ABNORMAL HIGH (ref 70–99)
Potassium: 5.2 mmol/L — ABNORMAL HIGH (ref 3.5–5.1)
Sodium: 141 mmol/L (ref 135–145)
Total Bilirubin: 0.4 mg/dL (ref 0.3–1.2)
Total Protein: 6.6 g/dL (ref 6.5–8.1)

## 2019-06-18 LAB — CBC WITH DIFFERENTIAL (CANCER CENTER ONLY)
Abs Immature Granulocytes: 0.02 10*3/uL (ref 0.00–0.07)
Basophils Absolute: 0.1 10*3/uL (ref 0.0–0.1)
Basophils Relative: 1 %
Eosinophils Absolute: 0.2 10*3/uL (ref 0.0–0.5)
Eosinophils Relative: 3 %
HCT: 36.1 % (ref 36.0–46.0)
Hemoglobin: 11.3 g/dL — ABNORMAL LOW (ref 12.0–15.0)
Immature Granulocytes: 0 %
Lymphocytes Relative: 47 %
Lymphs Abs: 3.6 10*3/uL (ref 0.7–4.0)
MCH: 27.3 pg (ref 26.0–34.0)
MCHC: 31.3 g/dL (ref 30.0–36.0)
MCV: 87.2 fL (ref 80.0–100.0)
Monocytes Absolute: 0.7 10*3/uL (ref 0.1–1.0)
Monocytes Relative: 9 %
Neutro Abs: 3 10*3/uL (ref 1.7–7.7)
Neutrophils Relative %: 40 %
Platelet Count: 328 10*3/uL (ref 150–400)
RBC: 4.14 MIL/uL (ref 3.87–5.11)
RDW: 14.1 % (ref 11.5–15.5)
WBC Count: 7.5 10*3/uL (ref 4.0–10.5)
nRBC: 0 % (ref 0.0–0.2)

## 2019-06-18 NOTE — Progress Notes (Signed)
Hematology and Oncology Follow Up Visit  [name] [id number] Oct 13, 1946 73 y.o. 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 q day -- maintanence on 09/06/2018  EC ASA 81 mg po q day     Interim History:  Ms. Brumbaugh is back for follow-up.  She actually is feeling much better.  We did do a CT angiogram of her chest on 05/21/2019.  There is no evidence of thromboembolic disease in her lungs.  She does have some asthma.  The hot, humid air has bothered her but she is trying to accommodate this by not being during the heat of the day.  She is mowing her yard.  She also mows her neighbors yard as he is out of town quite a bit.  There is been no problems with nausea or vomiting.  She has had no bleeding.  She has had no dyspepsia.  There is no leg swelling..  She is having a lot of problems with her right knee.  She supposed have surgery for it back in the springtime because of the coronavirus, this is been put off.  It sounds like it would not be done until the fall.  Overall, her performance status is ECOG 1.   Medications:  Current Outpatient Medications:  .  albuterol (PROAIR HFA) 108 (90 BASE) MCG/ACT inhaler, Inhale 2 puffs into the lungs every 4 (four) hours as needed for wheezing or shortness of breath., Disp: 2 Inhaler, Rfl: prn .  albuterol (PROVENTIL) (2.5 MG/3ML) 0.083% nebulizer solution, Take 3 mLs (2.5 mg total) by nebulization every 6 (six) hours as needed for wheezing or shortness of breath., Disp: 1080 mL, Rfl: 3 .  ALPRAZolam (XANAX) 0.25 MG tablet, Take 1 tablet by mouth at bedtime as needed for anxiety. , Disp: , Rfl:  .  benzonatate (TESSALON) 200 MG capsule, Take 1 capsule (200 mg total) by mouth 3 (three) times daily as needed for cough., Disp: 30 capsule, Rfl: 1 .  budesonide (PULMICORT) 0.25 MG/2ML  nebulizer solution, Take 2 mLs (0.25 mg total) by nebulization 2 (two) times daily. Dx: J45.20, Disp: 120 mL, Rfl: 5 .  desvenlafaxine (PRISTIQ) 100 MG 24 hr tablet, Take 100 mg by mouth daily., Disp: , Rfl:  .  dextromethorphan-guaiFENesin (MUCINEX DM) 30-600 MG 12hr tablet, Take 1 tablet by mouth 2 (two) times daily., Disp: 14 tablet, Rfl: 1 .  fluticasone (FLONASE) 50 MCG/ACT nasal spray, Place 2 sprays into both nostrils daily., Disp: 16 g, Rfl: 3 .  Fluticasone-Umeclidin-Vilant (TRELEGY ELLIPTA) 100-62.5-25 MCG/INH AEPB, Inhale 1 puff into the lungs daily., Disp: 60 each, Rfl: 12 .  Fluticasone-Umeclidin-Vilant (TRELEGY ELLIPTA) 100-62.5-25 MCG/INH AEPB, Inhale 1 puff into the lungs daily., Disp: 1 each, Rfl: 0 .  latanoprost (XALATAN) 0.005 % ophthalmic solution, Place 1 drop into both eyes at bedtime. , Disp: , Rfl: 2 .  levocetirizine (XYZAL) 5 MG tablet, Take 5 mg by mouth every evening., Disp: , Rfl:  .  levothyroxine (SYNTHROID, LEVOTHROID) 50 MCG tablet, Take 1 tablet by mouth daily., Disp: , Rfl:  .  losartan-hydrochlorothiazide (HYZAAR) 100-12.5 MG per tablet, Take 1 tablet by mouth daily.  , Disp: , Rfl:  .  Multiple Vitamin (MULTIVITAMIN) tablet, Take 1 tablet by mouth daily., Disp: , Rfl:  .  pantoprazole (PROTONIX) 40 MG tablet, TK 1 T PO QD, Disp: , Rfl: 0 .  pregabalin (LYRICA) 75 MG capsule, Take 75 mg by mouth 2 (two) times daily., Disp: , Rfl:  .  ranitidine (ZANTAC) 300 MG tablet, , Disp: , Rfl:  .  rivaroxaban (XARELTO) 10 MG TABS tablet, Take 1 tablet (10 mg total) by mouth daily with supper., Disp: 30 tablet, Rfl: 12 .  rOPINIRole (REQUIP) 2 MG tablet, , Disp: , Rfl: 1 .  senna (SENOKOT) 8.6 MG TABS tablet, Take 8.6 mg by mouth daily., Disp: , Rfl:  .  venlafaxine (EFFEXOR) 75 MG tablet, Take 75 mg by mouth daily., Disp: , Rfl:  .  Vitamin D, Ergocalciferol, (DRISDOL) 50000 units CAPS capsule, Take 50,000 Units by mouth once a week., Disp: , Rfl:   Allergies:   Allergies  Allergen Reactions  . Naproxen Itching and Swelling    Past Medical History, Surgical history, Social history, and Family History were reviewed and updated.  Review of Systems: Review of Systems  Constitutional: Negative.   HENT:  Negative.   Eyes: Negative.   Respiratory: Negative.   Cardiovascular: Negative.   Gastrointestinal: Negative.   Endocrine: Negative.   Genitourinary: Negative.    Musculoskeletal: Negative.   Skin: Negative.   Neurological: Negative.   Hematological: Negative.   Psychiatric/Behavioral: Negative.     Physical Exam:  oral temperature is 97.7 F (36.5 C). Her respiration is 18.   Wt Readings from Last 3 Encounters:  05/30/19 188 lb 3.2 oz (85.4 kg)  05/21/19 187 lb 6.4 oz (85 kg)  05/06/19 192 lb 9.6 oz (87.4 kg)    Physical Exam Vitals signs reviewed.  HENT:     Head: Normocephalic and atraumatic.  Eyes:     Pupils: Pupils are equal, round, and reactive to light.  Neck:     Musculoskeletal: Normal range of motion.  Cardiovascular:     Rate and Rhythm: Normal rate and regular rhythm.     Heart sounds: Normal heart sounds.  Pulmonary:     Effort: Pulmonary effort is normal.     Breath sounds: Normal breath sounds.  Abdominal:     General: Bowel sounds are normal.     Palpations: Abdomen is soft.  Musculoskeletal: Normal range of motion.        General: No tenderness or deformity.  Lymphadenopathy:     Cervical: No cervical adenopathy.  Skin:    General: Skin is warm and dry.     Findings: No erythema or rash.  Neurological:     Mental Status: She is alert and oriented to person, place, and time.  Psychiatric:        Behavior: Behavior normal.        Thought Content: Thought content normal.        Judgment: Judgment normal.      Lab Results  Component Value Date   WBC 8.0 05/21/2019   HGB 11.7 (L) 05/21/2019   HCT 36.7 05/21/2019   MCV 87.4 05/21/2019   PLT 338 05/21/2019     Chemistry      Component  Value Date/Time   NA 140 05/21/2019 0750   NA 141 11/03/2015 1117   K 5.0 05/21/2019 0750   CL 104 05/21/2019 0750   CO2 27 05/21/2019 0750   BUN 26 (H) 05/21/2019 0750   BUN 22 11/03/2015 1117   CREATININE 1.44 (H) 05/21/2019 0750      Component Value Date/Time   CALCIUM 9.2 05/21/2019 0750   ALKPHOS 94 05/21/2019 0750   AST 14 (L) 05/21/2019 0750  ALT 15 05/21/2019 0750   BILITOT 0.4 05/21/2019 0750       Impression and Plan: Ms. Purdon is a 73 year old white female with a pulmonary emboli and lower extremity thromboembolic disease.  She has a persistently positive lupus anticoagulant.   I think we can now get her back in 3 months.  We can get her back after Labor Day.  If she has any problems between now and then, we will be more than happy to see her back.  Volanda Napoleon, MD 6/30/20208:06 AM

## 2019-06-21 NOTE — Assessment & Plan Note (Signed)
Emphasized careful attention to reflux precautions and f/u w PCP.

## 2019-06-21 NOTE — Assessment & Plan Note (Signed)
Discussed glaucoma. Will try LAMA for tolerance with eye doc f/u, giving sample Trelegy. After sample go back to Advair for comparison. CXR

## 2019-06-21 NOTE — Assessment & Plan Note (Signed)
Managed by Hematology Xarelto>> BASA

## 2019-07-01 ENCOUNTER — Ambulatory Visit (HOSPITAL_BASED_OUTPATIENT_CLINIC_OR_DEPARTMENT_OTHER)
Admission: RE | Admit: 2019-07-01 | Discharge: 2019-07-01 | Disposition: A | Discharge status: Home / Self Care | Payer: Medicare Other | Source: Ambulatory Visit | Attending: Family | Admitting: Family

## 2019-07-01 ENCOUNTER — Telehealth: Payer: Self-pay | Admitting: *Deleted

## 2019-07-01 ENCOUNTER — Other Ambulatory Visit: Payer: Self-pay

## 2019-07-01 ENCOUNTER — Other Ambulatory Visit: Payer: Self-pay | Admitting: Family

## 2019-07-01 DIAGNOSIS — M79605 Pain in left leg: Secondary | ICD-10-CM

## 2019-07-01 DIAGNOSIS — M7989 Other specified soft tissue disorders: Secondary | ICD-10-CM | POA: Diagnosis present

## 2019-07-01 NOTE — Telephone Encounter (Signed)
Call received from patient stating that she is on Xarelto and has a "new bruise to her left foot along with a new knot on front lower left leg which is tender to the touch."  Informed pt that I would speak with Judson Roch and call her back with orders.  Call placed back to patient and patient notified per S. [place] NP that [place] will be ordered for her left lower leg to be done at [place] per pt.'s request.  Pt appreciative of call back.  Message sent to scheduling.

## 2019-07-02 ENCOUNTER — Telehealth: Payer: Self-pay | Admitting: *Deleted

## 2019-07-02 NOTE — Telephone Encounter (Addendum)
Patient aware of results  ----- Message from Eliezer Bottom, NP sent at 07/02/2019  8:07 AM EDT ----- No DVT!!! Margit Banda!!!   ----- Message ----- From: Interface, Rad Results In Sent: 07/01/2019   6:22 PM EDT To: Eliezer Bottom, NP

## 2019-07-05 ENCOUNTER — Ambulatory Visit: Payer: Self-pay | Admitting: Internal Medicine

## 2019-07-09 ENCOUNTER — Other Ambulatory Visit: Payer: Self-pay | Admitting: Internal Medicine

## 2019-07-14 NOTE — Assessment & Plan Note (Signed)
Recent Endoscopy Plan- f/u w GI, continue reflux precautions

## 2019-07-14 NOTE — Assessment & Plan Note (Signed)
She is noting some exertional dyspnea w/o CP Plan- Sample Trelegy for trial, schedule PFT

## 2019-07-15 ENCOUNTER — Other Ambulatory Visit (HOSPITAL_COMMUNITY)
Admission: RE | Admit: 2019-07-15 | Discharge: 2019-07-15 | Disposition: A | Discharge status: Home / Self Care | Payer: Medicare Other | Source: Ambulatory Visit | Attending: Internal Medicine | Admitting: Internal Medicine

## 2019-07-15 DIAGNOSIS — Z20828 Contact with and (suspected) exposure to other viral communicable diseases: Secondary | ICD-10-CM | POA: Diagnosis present

## 2019-07-15 LAB — SARS CORONAVIRUS 2 (TAT 6-24 HRS): SARS Coronavirus 2: NEGATIVE

## 2019-07-18 ENCOUNTER — Ambulatory Visit (INDEPENDENT_AMBULATORY_CARE_PROVIDER_SITE_OTHER): Payer: Medicare Other | Admitting: Internal Medicine

## 2019-07-18 ENCOUNTER — Other Ambulatory Visit: Payer: Self-pay

## 2019-07-18 DIAGNOSIS — R0609 Other forms of dyspnea: Secondary | ICD-10-CM | POA: Diagnosis not present

## 2019-07-18 LAB — PULMONARY FUNCTION TEST
DL/VA % pred: 80 %
DL/VA: 3.36 ml/min/mmHg/L
DLCO cor % pred: 86 %
DLCO cor: 15.65 ml/min/mmHg
DLCO unc % pred: 80 %
DLCO unc: 14.53 ml/min/mmHg
FEF 25-75 Post: 2.77 L/sec
FEF 25-75 Pre: 2.15 L/sec
FEF2575-%Change-Post: 28 %
FEF2575-%Pred-Post: 159 %
FEF2575-%Pred-Pre: 123 %
FEV1-%Change-Post: 6 %
FEV1-%Pred-Post: 109 %
FEV1-%Pred-Pre: 102 %
FEV1-Post: 2.24 L
FEV1-Pre: 2.1 L
FEV1FVC-%Change-Post: 0 %
FEV1FVC-%Pred-Pre: 108 %
FEV6-%Change-Post: 7 %
FEV6-%Pred-Post: 105 %
FEV6-%Pred-Pre: 98 %
FEV6-Post: 2.74 L
FEV6-Pre: 2.55 L
FEV6FVC-%Change-Post: 0 %
FEV6FVC-%Pred-Post: 104 %
FEV6FVC-%Pred-Pre: 104 %
FVC-%Change-Post: 7 %
FVC-%Pred-Post: 101 %
FVC-%Pred-Pre: 93 %
FVC-Post: 2.75 L
FVC-Pre: 2.55 L
Post FEV1/FVC ratio: 82 %
Post FEV6/FVC ratio: 100 %
Pre FEV1/FVC ratio: 82 %
Pre FEV6/FVC Ratio: 100 %
RV % pred: 58 %
RV: 1.24 L
TLC % pred: 87 %
TLC: 4.15 L

## 2019-07-18 NOTE — Progress Notes (Signed)
PFT completed today.  

## 2019-07-24 ENCOUNTER — Telehealth: Payer: Self-pay | Admitting: Internal Medicine

## 2019-07-24 NOTE — Telephone Encounter (Signed)
Pt is requesting PFT results from last week.  CY - please advise. Thanks.

## 2019-07-24 NOTE — Telephone Encounter (Signed)
Spoke with pt. She is aware of results. Nothing further was needed.  

## 2019-07-24 NOTE — Telephone Encounter (Signed)
Pulmonary Function Test results were within normal limits. This included airflow, lung volumes and ability to move oxygen and carbon dioxide normally between blood vessels and lungs. Good report.

## 2019-07-31 ENCOUNTER — Telehealth: Payer: Self-pay | Admitting: Internal Medicine

## 2019-07-31 NOTE — Telephone Encounter (Signed)
Both and pt's daughter, Venida Jarvis [facility]) are aware of results.  Pt's daughter is questioning how this test was normal, considering pt was dx with double PNA two days later at [facility].  Per sherri, pt has had same sx for months that she had during the time of PFT.  Sherri also requested pt's records, I have provided her with medical records contact number.   CY please advise regarding PNA? Thanks

## 2019-07-31 NOTE — Telephone Encounter (Signed)
Spoke with Sherri and she would like to know the results of the pulmonary function test. Please advise

## 2019-07-31 NOTE — Telephone Encounter (Signed)
The pulmonary function tests were normal, including airflow, lung volume and gas exchange. This doesn't rule out asthma, which can come and go, but it is a very good result.

## 2019-08-02 ENCOUNTER — Other Ambulatory Visit: Payer: Self-pay

## 2019-08-02 ENCOUNTER — Ambulatory Visit
Admission: RE | Admit: 2019-08-02 | Discharge: 2019-08-02 | Disposition: A | Discharge status: Home / Self Care | Payer: Medicare Other | Source: Ambulatory Visit | Attending: Physician Assistant | Admitting: Physician Assistant

## 2019-08-02 DIAGNOSIS — Z1231 Encounter for screening mammogram for malignant neoplasm of breast: Secondary | ICD-10-CM

## 2019-08-05 NOTE — Telephone Encounter (Signed)
Dr. Annamaria Boots, please advise on info stated by [facility] in regards to pt. Thanks!

## 2019-08-06 ENCOUNTER — Telehealth: Payer: Self-pay | Admitting: Internal Medicine

## 2019-08-06 NOTE — Telephone Encounter (Signed)
Pt is returning CY's phone call from 07/31/2019 to go over her results (see phone note from 07/31/2019).   CY, please advise. Thank you.

## 2019-08-06 NOTE — Telephone Encounter (Signed)
I attempted to call patient re her questions. Left message for call-back to our office.

## 2019-08-07 ENCOUNTER — Telehealth: Payer: Self-pay | Admitting: *Deleted

## 2019-08-07 NOTE — Telephone Encounter (Signed)
Her PFT and CT chest were good, without obvious pulmonary problem. She developed pneumonia LLL with no prodrome, 3 days after PFT. Has ongoing "bad" GERD symptoms. Has to prop up on 3 pillow in bed or she coughs. GI Dr Erlene Quan was going to do upper endoscopy but told her he couldn't because she "was already bleeding in there". We discussed atherosclerosis seen on CT as another possible reason for DOE to be evaluated by her PCP, but I told her most obvious problem is GERD, which may welll be the cause of her cough and the cause of an (aspiration) pneumonia. She needs to discuss these issues for management through her PCP. She says insurance wasn't covering her GI procedures.

## 2019-08-07 NOTE — Telephone Encounter (Signed)
Message received from patient requesting the name of a Pulmonary MD recommended from Dr. Marin Olp.  Call placed back to patient and message left on pt.'s private cell phone to notify pt per order of Dr. Marin Olp to call [facility] at [phone].  Instructed pt to call office back with any questions or concerns.

## 2019-08-19 ENCOUNTER — Other Ambulatory Visit: Payer: Self-pay | Admitting: Internal Medicine

## 2019-08-21 NOTE — Telephone Encounter (Signed)
Appropriate for refill

## 2019-08-21 NOTE — Telephone Encounter (Signed)
Tessalon refill e-sent

## 2019-08-29 ENCOUNTER — Inpatient Hospital Stay: Payer: Medicare Other

## 2019-08-29 ENCOUNTER — Ambulatory Visit: Payer: Self-pay | Admitting: Hematology & Oncology

## 2019-09-10 ENCOUNTER — Other Ambulatory Visit: Payer: Self-pay

## 2019-09-10 ENCOUNTER — Inpatient Hospital Stay: Payer: Medicare Other

## 2019-09-10 ENCOUNTER — Encounter: Payer: Self-pay | Admitting: Hematology & Oncology

## 2019-09-10 ENCOUNTER — Inpatient Hospital Stay: Payer: Medicare Other | Attending: Hematology & Oncology | Admitting: Hematology & Oncology

## 2019-09-10 VITALS — BP 120/66 | HR 83 | Temp 97.8°F | Wt 186.0 lb

## 2019-09-10 DIAGNOSIS — J45909 Unspecified asthma, uncomplicated: Secondary | ICD-10-CM | POA: Diagnosis not present

## 2019-09-10 DIAGNOSIS — I2699 Other pulmonary embolism without acute cor pulmonale: Secondary | ICD-10-CM | POA: Diagnosis not present

## 2019-09-10 DIAGNOSIS — Z886 Allergy status to analgesic agent status: Secondary | ICD-10-CM | POA: Diagnosis not present

## 2019-09-10 DIAGNOSIS — M329 Systemic lupus erythematosus, unspecified: Secondary | ICD-10-CM | POA: Diagnosis not present

## 2019-09-10 DIAGNOSIS — I2609 Other pulmonary embolism with acute cor pulmonale: Secondary | ICD-10-CM

## 2019-09-10 DIAGNOSIS — Z7901 Long term (current) use of anticoagulants: Secondary | ICD-10-CM | POA: Diagnosis not present

## 2019-09-10 DIAGNOSIS — Z79899 Other long term (current) drug therapy: Secondary | ICD-10-CM | POA: Diagnosis not present

## 2019-09-10 DIAGNOSIS — M1990 Unspecified osteoarthritis, unspecified site: Secondary | ICD-10-CM | POA: Diagnosis not present

## 2019-09-10 DIAGNOSIS — D6862 Lupus anticoagulant syndrome: Secondary | ICD-10-CM | POA: Insufficient documentation

## 2019-09-10 DIAGNOSIS — J4520 Mild intermittent asthma, uncomplicated: Secondary | ICD-10-CM

## 2019-09-10 LAB — CBC WITH DIFFERENTIAL (CANCER CENTER ONLY)
Abs Immature Granulocytes: 0.03 10*3/uL (ref 0.00–0.07)
Basophils Absolute: 0.1 10*3/uL (ref 0.0–0.1)
Basophils Relative: 1 %
Eosinophils Absolute: 0.1 10*3/uL (ref 0.0–0.5)
Eosinophils Relative: 2 %
HCT: 35.5 % — ABNORMAL LOW (ref 36.0–46.0)
Hemoglobin: 11.4 g/dL — ABNORMAL LOW (ref 12.0–15.0)
Immature Granulocytes: 0 %
Lymphocytes Relative: 49 %
Lymphs Abs: 4.1 10*3/uL — ABNORMAL HIGH (ref 0.7–4.0)
MCH: 27.1 pg (ref 26.0–34.0)
MCHC: 32.1 g/dL (ref 30.0–36.0)
MCV: 84.3 fL (ref 80.0–100.0)
Monocytes Absolute: 1 10*3/uL (ref 0.1–1.0)
Monocytes Relative: 12 %
Neutro Abs: 3 10*3/uL (ref 1.7–7.7)
Neutrophils Relative %: 36 %
Platelet Count: 329 10*3/uL (ref 150–400)
RBC: 4.21 MIL/uL (ref 3.87–5.11)
RDW: 14.9 % (ref 11.5–15.5)
WBC Count: 8.3 10*3/uL (ref 4.0–10.5)
nRBC: 0 % (ref 0.0–0.2)

## 2019-09-10 LAB — CMP (CANCER CENTER ONLY)
ALT: 18 U/L (ref 0–44)
AST: 17 U/L (ref 15–41)
Albumin: 4 g/dL (ref 3.5–5.0)
Alkaline Phosphatase: 86 U/L (ref 38–126)
Anion gap: 8 (ref 5–15)
BUN: 16 mg/dL (ref 8–23)
CO2: 26 mmol/L (ref 22–32)
Calcium: 9.6 mg/dL (ref 8.9–10.3)
Chloride: 104 mmol/L (ref 98–111)
Creatinine: 1.29 mg/dL — ABNORMAL HIGH (ref 0.44–1.00)
GFR, Est AFR Am: 48 mL/min — ABNORMAL LOW (ref >60)
GFR, Estimated: 42 mL/min — ABNORMAL LOW (ref >60)
Glucose, Bld: 131 mg/dL — ABNORMAL HIGH (ref 70–99)
Potassium: 4.6 mmol/L (ref 3.5–5.1)
Sodium: 138 mmol/L (ref 135–145)
Total Bilirubin: 0.7 mg/dL (ref 0.3–1.2)
Total Protein: 7 g/dL (ref 6.5–8.1)

## 2019-09-10 NOTE — Progress Notes (Signed)
Hematology and Oncology Follow Up Visit  [name] [id number] 10-Jun-1946 73 y.o. 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 q day -- maintanence on 09/06/2018  EC ASA 81 mg po q day     Interim History:  [name] is back for follow-up.  Her main problem seems to be her breathing.  She is having some asthma issues.  I know that she sees a pulmonologist.  We probably need to get her to see her pulmonologist again.  She is doing okay with the Xarelto.  She is having no problems with bleeding.  She does have problems with arthritis.  She has knee issues.  She probably needs to have knee surgery.  She is not sure when she is going to have this done.  There is been no issue with fever.  She has had no change in bowel or bladder habits.  She has had no nausea or vomiting.  Overall, her performance status is ECOG 1.   Medications:  Current Outpatient Medications:  .  albuterol (PROAIR HFA) 108 (90 BASE) MCG/ACT inhaler, Inhale 2 puffs into the lungs every 4 (four) hours as needed for wheezing or shortness of breath., Disp: 2 Inhaler, Rfl: prn .  albuterol (PROVENTIL) (2.5 MG/3ML) 0.083% nebulizer solution, Take 3 mLs (2.5 mg total) by nebulization every 6 (six) hours as needed for wheezing or shortness of breath., Disp: 1080 mL, Rfl: 3 .  ALPRAZolam (XANAX) 0.25 MG tablet, Take 1 tablet by mouth at bedtime as needed for anxiety. , Disp: , Rfl:  .  benzonatate (TESSALON) 200 MG capsule, Take 1 capsule (200 mg total) by mouth 3 (three) times daily as needed for cough., Disp: 30 capsule, Rfl: 5 .  budesonide (PULMICORT) 0.25 MG/2ML nebulizer solution, Take 2 mLs (0.25 mg total) by nebulization 2 (two) times daily. Dx: J45.20, Disp: 120 mL, Rfl: 5 .  desvenlafaxine (PRISTIQ) 100 MG 24 hr tablet, Take 100 mg by mouth daily., Disp: ,  Rfl:  .  dextromethorphan-guaiFENesin (MUCINEX DM) 30-600 MG 12hr tablet, Take 1 tablet by mouth 2 (two) times daily., Disp: 14 tablet, Rfl: 1 .  latanoprost (XALATAN) 0.005 % ophthalmic solution, Place 1 drop into both eyes at bedtime. , Disp: , Rfl: 2 .  levocetirizine (XYZAL) 5 MG tablet, Take 5 mg by mouth every evening., Disp: , Rfl:  .  levothyroxine (SYNTHROID, LEVOTHROID) 50 MCG tablet, Take 1 tablet by mouth daily., Disp: , Rfl:  .  losartan-hydrochlorothiazide (HYZAAR) 100-12.5 MG per tablet, Take 1 tablet by mouth daily.  , Disp: , Rfl:  .  Multiple Vitamin (MULTIVITAMIN) tablet, Take 1 tablet by mouth daily., Disp: , Rfl:  .  pantoprazole (PROTONIX) 40 MG tablet, TK 1 T PO QD, Disp: , Rfl: 0 .  pregabalin (LYRICA) 75 MG capsule, Take 75 mg by mouth 2 (two) times daily., Disp: , Rfl:  .  ranitidine (ZANTAC) 300 MG tablet, , Disp: , Rfl:  .  rivaroxaban (XARELTO) 10 MG TABS tablet, Take 1 tablet (10 mg total) by mouth daily with supper., Disp: 30 tablet, Rfl: 12 .  rOPINIRole (REQUIP) 2 MG tablet, , Disp: , Rfl: 1 .  senna (SENOKOT) 8.6 MG TABS tablet, Take 8.6 mg by mouth daily., Disp: , Rfl:  .  venlafaxine (EFFEXOR) 75 MG tablet, Take 75 mg by mouth daily., Disp: , Rfl:  .  Vitamin D, Ergocalciferol, (DRISDOL) 50000 units CAPS capsule, Take 50,000 Units by mouth once a week., Disp: , Rfl:   Allergies:  Allergies  Allergen Reactions  . Naproxen Itching and Swelling    Past Medical History, Surgical history, Social history, and Family History were reviewed and updated.  Review of Systems: Review of Systems  Constitutional: Negative.   HENT:  Negative.   Eyes: Negative.   Respiratory: Negative.   Cardiovascular: Negative.   Gastrointestinal: Negative.   Endocrine: Negative.   Genitourinary: Negative.    Musculoskeletal: Negative.   Skin: Negative.   Neurological: Negative.   Hematological: Negative.   Psychiatric/Behavioral: Negative.     Physical Exam:  weight is  186 lb (84.4 kg). Her oral temperature is 97.8 F (36.6 C). Her blood pressure is 120/66 and her pulse is 83. Her oxygen saturation is 100%.   Wt Readings from Last 3 Encounters:  09/10/19 186 lb (84.4 kg)  05/30/19 188 lb 3.2 oz (85.4 kg)  05/21/19 187 lb 6.4 oz (85 kg)    Physical Exam Vitals signs reviewed.  HENT:     Head: Normocephalic and atraumatic.  Eyes:     Pupils: Pupils are equal, round, and reactive to light.  Neck:     Musculoskeletal: Normal range of motion.  Cardiovascular:     Rate and Rhythm: Normal rate and regular rhythm.     Heart sounds: Normal heart sounds.  Pulmonary:     Effort: Pulmonary effort is normal.     Breath sounds: Normal breath sounds.  Abdominal:     General: Bowel sounds are normal.     Palpations: Abdomen is soft.  Musculoskeletal: Normal range of motion.        General: No tenderness or deformity.  Lymphadenopathy:     Cervical: No cervical adenopathy.  Skin:    General: Skin is warm and dry.     Findings: No erythema or rash.  Neurological:     Mental Status: She is alert and oriented to person, place, and time.  Psychiatric:        Behavior: Behavior normal.        Thought Content: Thought content normal.        Judgment: Judgment normal.      Lab Results  Component Value Date   WBC 8.3 09/10/2019   HGB 11.4 (L) 09/10/2019   HCT 35.5 (L) 09/10/2019   MCV 84.3 09/10/2019   PLT 329 09/10/2019     Chemistry      Component Value Date/Time   NA 138 09/10/2019 0738   NA 141 11/03/2015 1117   K 4.6 09/10/2019 0738   CL 104 09/10/2019 0738   CO2 26 09/10/2019 0738   BUN 16 09/10/2019 0738   BUN 22 11/03/2015 1117   CREATININE 1.29 (H) 09/10/2019 0738      Component Value Date/Time   CALCIUM 9.6 09/10/2019 0738   ALKPHOS 86 09/10/2019 0738   AST 17 09/10/2019 0738   ALT 18 09/10/2019 0738   BILITOT 0.7 09/10/2019 0738       Impression and Plan: [name] is a 73 year old white female with a pulmonary emboli  and lower extremity thromboembolic disease.  She has a transiently  positive lupus anticoagulant.  Last time we checked her lupus anticoagulant back in March, it was negative.  Again, she is mostly worried about her breathing.  We will have to see about making referral to pulmonology.  If she does need to have knee surgery, I  do not see a problem with her getting that from my point of view.  We will plan to get her back to see [place] in another couple months.  I want to try to get her back before the holidays in November so we can see how things are going.  Volanda Napoleon, MD 9/22/20201:40 PM

## 2019-09-16 ENCOUNTER — Ambulatory Visit: Payer: Medicare Other | Admitting: Internal Medicine

## 2019-09-25 ENCOUNTER — Other Ambulatory Visit: Payer: Self-pay | Admitting: Hematology & Oncology

## 2019-11-26 ENCOUNTER — Other Ambulatory Visit: Payer: Self-pay

## 2019-11-26 ENCOUNTER — Telehealth: Payer: Self-pay | Admitting: Hematology & Oncology

## 2019-11-26 ENCOUNTER — Inpatient Hospital Stay: Payer: Medicare Other | Attending: Hematology & Oncology | Admitting: Hematology & Oncology

## 2019-11-26 ENCOUNTER — Inpatient Hospital Stay: Payer: Medicare Other

## 2019-11-26 ENCOUNTER — Encounter: Payer: Self-pay | Admitting: Hematology & Oncology

## 2019-11-26 VITALS — BP 152/66 | HR 79 | Temp 98.0°F | Resp 18 | Wt 179.0 lb

## 2019-11-26 DIAGNOSIS — Z86711 Personal history of pulmonary embolism: Secondary | ICD-10-CM | POA: Insufficient documentation

## 2019-11-26 DIAGNOSIS — I2690 Septic pulmonary embolism without acute cor pulmonale: Secondary | ICD-10-CM

## 2019-11-26 DIAGNOSIS — M329 Systemic lupus erythematosus, unspecified: Secondary | ICD-10-CM | POA: Diagnosis not present

## 2019-11-26 DIAGNOSIS — Z7901 Long term (current) use of anticoagulants: Secondary | ICD-10-CM | POA: Insufficient documentation

## 2019-11-26 DIAGNOSIS — R05 Cough: Secondary | ICD-10-CM | POA: Diagnosis not present

## 2019-11-26 DIAGNOSIS — I2609 Other pulmonary embolism with acute cor pulmonale: Secondary | ICD-10-CM

## 2019-11-26 DIAGNOSIS — Z79899 Other long term (current) drug therapy: Secondary | ICD-10-CM | POA: Diagnosis not present

## 2019-11-26 DIAGNOSIS — D6862 Lupus anticoagulant syndrome: Secondary | ICD-10-CM | POA: Insufficient documentation

## 2019-11-26 DIAGNOSIS — Z886 Allergy status to analgesic agent status: Secondary | ICD-10-CM | POA: Insufficient documentation

## 2019-11-26 LAB — CBC WITH DIFFERENTIAL (CANCER CENTER ONLY)
Abs Immature Granulocytes: 0.03 10*3/uL (ref 0.00–0.07)
Basophils Absolute: 0.1 10*3/uL (ref 0.0–0.1)
Basophils Relative: 1 %
Eosinophils Absolute: 0.1 10*3/uL (ref 0.0–0.5)
Eosinophils Relative: 2 %
HCT: 35.9 % — ABNORMAL LOW (ref 36.0–46.0)
Hemoglobin: 11.5 g/dL — ABNORMAL LOW (ref 12.0–15.0)
Immature Granulocytes: 0 %
Lymphocytes Relative: 50 %
Lymphs Abs: 3.6 10*3/uL (ref 0.7–4.0)
MCH: 26.6 pg (ref 26.0–34.0)
MCHC: 32 g/dL (ref 30.0–36.0)
MCV: 83.1 fL (ref 80.0–100.0)
Monocytes Absolute: 0.5 10*3/uL (ref 0.1–1.0)
Monocytes Relative: 6 %
Neutro Abs: 3 10*3/uL (ref 1.7–7.7)
Neutrophils Relative %: 41 %
Platelet Count: 379 10*3/uL (ref 150–400)
RBC: 4.32 MIL/uL (ref 3.87–5.11)
RDW: 14.1 % (ref 11.5–15.5)
WBC Count: 7.4 10*3/uL (ref 4.0–10.5)
nRBC: 0 % (ref 0.0–0.2)

## 2019-11-26 LAB — CMP (CANCER CENTER ONLY)
ALT: 14 U/L (ref 0–44)
AST: 19 U/L (ref 15–41)
Albumin: 4.2 g/dL (ref 3.5–5.0)
Alkaline Phosphatase: 87 U/L (ref 38–126)
Anion gap: 8 (ref 5–15)
BUN: 12 mg/dL (ref 8–23)
CO2: 26 mmol/L (ref 22–32)
Calcium: 9.5 mg/dL (ref 8.9–10.3)
Chloride: 107 mmol/L (ref 98–111)
Creatinine: 1.02 mg/dL — ABNORMAL HIGH (ref 0.44–1.00)
GFR, Est AFR Am: >60 mL/min (ref >60)
GFR, Estimated: 55 mL/min — ABNORMAL LOW (ref >60)
Glucose, Bld: 113 mg/dL — ABNORMAL HIGH (ref 70–99)
Potassium: 4.2 mmol/L (ref 3.5–5.1)
Sodium: 141 mmol/L (ref 135–145)
Total Bilirubin: 0.5 mg/dL (ref 0.3–1.2)
Total Protein: 7 g/dL (ref 6.5–8.1)

## 2019-11-26 LAB — LACTATE DEHYDROGENASE: LDH: 215 U/L — ABNORMAL HIGH (ref 98–192)

## 2019-11-26 NOTE — Telephone Encounter (Signed)
Appointments scheduled calendar printed per 1/28 los 

## 2019-11-26 NOTE — Progress Notes (Signed)
Hematology and Oncology Follow Up Visit  [name] [id number] 1946-08-16 73 y.o. 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 q day -- maintanence on 09/06/2018  EC ASA 81 mg po q day     Interim History:  [name] is back for follow-up.  She is doing pretty well.  Her breathing seems to be doing little bit better.  She is on some inhalers.  Her knees are still giving her trouble.  She sees orthopedist next week.  She did have a nice Thanksgiving.  She is with her daughter and grandsons.  She has had a little bit of a cough.  Is a dry cough.  She has had no bleeding.  She is on Xarelto and aspirin.  She has had no change in bowel or bladder habits.  There has been no issues with leg swelling.  There is no leg pain.  She has had no chest wall pain.  Overall, her performance status is ECOG 1.   Medications:  Current Outpatient Medications:  .  albuterol (PROAIR HFA) 108 (90 BASE) MCG/ACT inhaler, Inhale 2 puffs into the lungs every 4 (four) hours as needed for wheezing or shortness of breath., Disp: 2 Inhaler, Rfl: prn .  albuterol (PROVENTIL) (2.5 MG/3ML) 0.083% nebulizer solution, Take 3 mLs (2.5 mg total) by nebulization every 6 (six) hours as needed for wheezing or shortness of breath., Disp: 1080 mL, Rfl: 3 .  ALPRAZolam (XANAX) 0.25 MG tablet, Take 1 tablet by mouth at bedtime as needed for anxiety. , Disp: , Rfl:  .  benzonatate (TESSALON) 200 MG capsule, Take 1 capsule (200 mg total) by mouth 3 (three) times daily as needed for cough., Disp: 30 capsule, Rfl: 5 .  budesonide (PULMICORT) 0.25 MG/2ML nebulizer solution, Take 2 mLs (0.25 mg total) by nebulization 2 (two) times daily. Dx: J45.20, Disp: 120 mL, Rfl: 5 .  desvenlafaxine (PRISTIQ) 100 MG 24 hr tablet, Take 100 mg by mouth daily., Disp: , Rfl:  .   dextromethorphan-guaiFENesin (MUCINEX DM) 30-600 MG 12hr tablet, Take 1 tablet by mouth 2 (two) times daily., Disp: 14 tablet, Rfl: 1 .  latanoprost (XALATAN) 0.005 % ophthalmic solution, Place 1 drop into both eyes at bedtime. , Disp: , Rfl: 2 .  levocetirizine (XYZAL) 5 MG tablet, Take 5 mg by mouth every evening., Disp: , Rfl:  .  levothyroxine (SYNTHROID, LEVOTHROID) 50 MCG tablet, Take 1 tablet by mouth daily., Disp: , Rfl:  .  losartan-hydrochlorothiazide (HYZAAR) 100-12.5 MG per tablet, Take 1 tablet by mouth daily.  , Disp: , Rfl:  .  Multiple Vitamin (MULTIVITAMIN) tablet, Take 1 tablet by mouth daily., Disp: , Rfl:  .  pantoprazole (PROTONIX) 40 MG tablet, TK 1 T PO QD, Disp: , Rfl: 0 .  pregabalin (LYRICA) 75 MG capsule, Take 75 mg by mouth 2 (two) times daily., Disp: , Rfl:  .  ranitidine (ZANTAC) 300 MG tablet, , Disp: , Rfl:  .  rOPINIRole (REQUIP) 2 MG tablet, , Disp: , Rfl: 1 .  senna (SENOKOT) 8.6 MG TABS tablet, Take 8.6 mg by mouth daily., Disp: , Rfl:  .  venlafaxine (EFFEXOR) 75 MG tablet, Take 75 mg by mouth daily., Disp: , Rfl:  .  Vitamin D, Ergocalciferol, (DRISDOL) 50000 units CAPS capsule, Take 50,000 Units by mouth once a week., Disp: , Rfl:  .  XARELTO 10 MG TABS tablet, Take 1 tablet (10 mg total) by mouth daily with supper., Disp: 30 tablet, Rfl: 12  Allergies:  Allergies  Allergen Reactions  . Naproxen Itching and Swelling    Past Medical History, Surgical history, Social history, and Family History were reviewed and updated.  Review of Systems: Review of Systems  Constitutional: Negative.   HENT:  Negative.   Eyes: Negative.   Respiratory: Negative.   Cardiovascular: Negative.   Gastrointestinal: Negative.   Endocrine: Negative.   Genitourinary: Negative.    Musculoskeletal: Negative.   Skin: Negative.   Neurological: Negative.   Hematological: Negative.   Psychiatric/Behavioral: Negative.     Physical Exam:  weight is 179 lb (81.2 kg). Her  temporal temperature is 98 F (36.7 C). Her blood pressure is 152/66 (abnormal) and her pulse is 79. Her respiration is 18 and oxygen saturation is 99%.   Wt Readings from Last 3 Encounters:  11/26/19 179 lb (81.2 kg)  09/10/19 186 lb (84.4 kg)  05/30/19 188 lb 3.2 oz (85.4 kg)    Physical Exam Vitals signs reviewed.  HENT:     Head: Normocephalic and atraumatic.  Eyes:     Pupils: Pupils are equal, round, and reactive to light.  Neck:     Musculoskeletal: Normal range of motion.  Cardiovascular:     Rate and Rhythm: Normal rate and regular rhythm.     Heart sounds: Normal heart sounds.  Pulmonary:     Effort: Pulmonary effort is normal.     Breath sounds: Normal breath sounds.  Abdominal:     General: Bowel sounds are normal.     Palpations: Abdomen is soft.  Musculoskeletal: Normal range of motion.        General: No tenderness or deformity.  Lymphadenopathy:     Cervical: No cervical adenopathy.  Skin:    General: Skin is warm and dry.     Findings: No erythema or rash.  Neurological:     Mental Status: She is alert and oriented to person, place, and time.  Psychiatric:        Behavior: Behavior normal.        Thought Content: Thought content normal.        Judgment: Judgment normal.      Lab Results  Component Value Date   WBC 7.4 11/26/2019   HGB 11.5 (L) 11/26/2019   HCT 35.9 (L) 11/26/2019   MCV 83.1 11/26/2019   PLT 379 11/26/2019     Chemistry      Component Value Date/Time   NA 138 09/10/2019 0738   NA 141 11/03/2015 1117   K 4.6 09/10/2019 0738   CL 104 09/10/2019 0738   CO2 26 09/10/2019 0738   BUN 16 09/10/2019 0738   BUN 22 11/03/2015 1117   CREATININE 1.29 (H) 09/10/2019 0738      Component Value Date/Time   CALCIUM 9.6 09/10/2019 0738   ALKPHOS 86 09/10/2019 0738   AST 17 09/10/2019 0738   ALT 18 09/10/2019 0738   BILITOT 0.7 09/10/2019 0738       Impression and Plan: [name] is a 73 year old white female with a pulmonary  emboli and lower extremity thromboembolic disease.  She has a transiently  positive lupus anticoagulant.  Last time we checked her lupus anticoagulant back in March, it was negative.  I am happy that she is doing quite well.  Hopefully, her breathing will continue to improve.  Her knees are a problem.  She  sees the orthopedist next week.  I think with everything being stable we can now get her back in 4 months.  I think this would be reasonable.    Volanda Napoleon, MD 12/8/20208:09 AM

## 2019-12-03 ENCOUNTER — Ambulatory Visit (INDEPENDENT_AMBULATORY_CARE_PROVIDER_SITE_OTHER): Payer: Medicare Other | Admitting: Internal Medicine

## 2019-12-03 ENCOUNTER — Other Ambulatory Visit: Payer: Self-pay

## 2019-12-03 ENCOUNTER — Encounter: Payer: Self-pay | Admitting: Internal Medicine

## 2019-12-03 VITALS — BP 122/78 | HR 78 | Temp 98.1°F | Ht 62.0 in | Wt 177.0 lb

## 2019-12-03 DIAGNOSIS — K219 Gastro-esophageal reflux disease without esophagitis: Secondary | ICD-10-CM

## 2019-12-03 DIAGNOSIS — J302 Other seasonal allergic rhinitis: Secondary | ICD-10-CM

## 2019-12-03 DIAGNOSIS — J3089 Other allergic rhinitis: Secondary | ICD-10-CM | POA: Diagnosis not present

## 2019-12-03 DIAGNOSIS — R0600 Dyspnea, unspecified: Secondary | ICD-10-CM

## 2019-12-03 DIAGNOSIS — J4521 Mild intermittent asthma with (acute) exacerbation: Secondary | ICD-10-CM | POA: Diagnosis not present

## 2019-12-03 DIAGNOSIS — R0609 Other forms of dyspnea: Secondary | ICD-10-CM

## 2019-12-03 MED ORDER — METHYLPREDNISOLONE ACETATE 80 MG/ML IJ SUSP
80.0000 mg | Freq: Once | INTRAMUSCULAR | Status: AC
  Administered 2019-12-04: 16:00:00 80 mg via INTRAMUSCULAR

## 2019-12-03 NOTE — Assessment & Plan Note (Signed)
Working with GI, medicated and sleeps w hob elevated. Still recognizes reflux/ cough when she is lying down. High probability this causes her cough, and caused her pneumonia earlier this year.

## 2019-12-03 NOTE — Patient Instructions (Signed)
Order- depo 80   Dx Rhinitis  Keep up the efforts to minimize reflux  Ok to use the albuterol inhaler or nebulizer when needed   Please call if we can help- for instance if the sinus issues don't improve.

## 2019-12-03 NOTE — Assessment & Plan Note (Signed)
Recent frontal pressure w/o pain or purulence. Some postnasal drip. Plan- Continue flonase. Try adding otc antihistamine. If not helpful consider astelin, consider trial abx.

## 2019-12-03 NOTE — Assessment & Plan Note (Signed)
Not anemic, normal PFT. Some asthma. Mostly she is obese and deconditioned. 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

## 2019-12-03 NOTE — Assessment & Plan Note (Signed)
Controlled without exacerbation

## 2019-12-03 NOTE — Progress Notes (Signed)
HPI F never smoker followed for allergic rhinitis, asthma, complicated by GERD, HBP, glaucoma PFT 07/18/2019- WNL   FEV1/FVC 108%, no resp to BD, TLC 87%, DLCO 80% --------------------------------------------------------------------------------------   05/30/2019- 73 year old female never smoker followed for allergic rhinitis, asthma, complicated by GERD, HBP, glaucoma, PE/ R DVT 02/22/18/ Xarelto/+ lupus anticoagulant, CKD3 -----Feels like has increased SOB with exertion.States she is realizing she is holding breath alot. Cough has improved. Has some labored breathing during recent endoscopy.. Only needing rescue inhaler 1-2 times a week. Agrees to PFT  12/03/2019- 73 year old female never smoker followed for allergic rhinitis, asthma, complicated by GERD, HBP, glaucoma, PE/ R DVT 02/22/18/ Xarelto/+ lupus anticoagulant, CKD3 She had pneumonia 3 days after PFT We had discussed ASCVD as possible cause of DOE Hx "bad" GERD, recognized as potential cause of cough and pneumonia.  -----6 month f/u Asthma. Patient stated SOB with walking. Albuterol hfa, Neb albut,  Working with GI and sleeps in adjustable bed, but still recognizes cough mostly lying down at night and realizes strong connection to her GERD. Still DOE walking, but getting very little exercise. Both knees very bad, wears heavy knee brace. Waiting for TKR.  PNdrip and some frontal pressure. Nonpurulent . Uses flonase daily. PFT 07/18/2019- WNL   FEV1/FVC 108%, no resp to BD, TLC 87%, DLCO 80% CTa chest PE- 05/21/2019- IMPRESSION: 1. No evidence of recurrent pulmonary thromboembolic disease or other acute chest findings. 2. Coronary and Aortic Atherosclerosis (ICD10-I70.0). 3. Minimal bibasilar atelectasis. Doppler leg veins 07/01/2019-  1. No evidence of acute or chronic deep venous thrombosis. 2. 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 3. Minimal soft tissue swelling at the site of  palpable abnormality in the anterior left lower leg, no organized collection.   ROS- see HPI + = positive Constitutional:   No-   weight loss, night sweats, fevers, chills, fatigue, lassitude. HEENT:   No-  headaches, difficulty swallowing, tooth/dental problems, sore throat,       No-  sneezing, itching, ear ache,    +nasal congestion, no-post nasal drip,  CV:  No-   chest pain, orthopnea, PND, swelling in lower extremities, anasarca, dizziness, palpitations Resp: +  shortness of breath with exertion or at rest.              No-  productive cough,  + non-productive cough,  No- coughing up of blood.              No-   change in color of mucus.  + wheezing.   Skin: No-   rash or lesions. GI:  No-   +heartburn, indigestion, no-abdominal pain, nausea, vomiting, GU: MS: +  joint pain or swelling.   Neuro-     nothing unusual Psych:  No- change in mood or affect. No- depression or anxiety.  No memory loss.  OBJ General- Alert, Oriented, Affect-appropriate, Distress- none acute, + overweight Skin- rash-none, lesions- none, excoriation- none Lymphadenopathy- none Head- atraumatic            Eyes- Gross vision intact, PERRLA, conjunctivae clear secretions            Ears- Hearing, canals-normal- minor wax.            Nose- turbinate edema, no-Septal dev, mucus, polyps, erosion, perforation             Throat- Mallampati II , mucosa clear , drainage- none seen, tonsils- atrophic ,   Neck- flexible , trachea midline, no  stridor , thyroid nl, carotid no bruit Chest - symmetrical excursion , unlabored           Heart/CV- RRR , no murmur , no gallop  , no rub, nl s1 s2                           - JVD- none , edema- none, stasis changes- none, varices- none           Lung- clear to P&A,  Cough+dry, dullness-none, rub- none, wheeze-none           Chest wall-  Abd-  Br/ Gen/ Rectal- Not done, not indicated Extrem- cyanosis- none, clubbing, none, atrophy- none, strength- nl. + Osteoarthritic  deformity of fingers.   + Brace L leg          Neuro- grossly intact to observation

## 2019-12-25 ENCOUNTER — Ambulatory Visit: Payer: Medicare Other | Attending: Internal Medicine

## 2019-12-25 DIAGNOSIS — Z20822 Contact with and (suspected) exposure to covid-19: Secondary | ICD-10-CM

## 2019-12-27 LAB — NOVEL CORONAVIRUS, NAA: SARS-CoV-2, NAA: NOT DETECTED

## 2020-03-27 ENCOUNTER — Telehealth: Payer: Self-pay | Admitting: Hematology & Oncology

## 2020-03-27 ENCOUNTER — Other Ambulatory Visit: Payer: Self-pay

## 2020-03-27 ENCOUNTER — Inpatient Hospital Stay: Payer: Medicare Other | Attending: Hematology & Oncology

## 2020-03-27 ENCOUNTER — Inpatient Hospital Stay (HOSPITAL_BASED_OUTPATIENT_CLINIC_OR_DEPARTMENT_OTHER): Payer: Medicare Other | Admitting: Hematology & Oncology

## 2020-03-27 VITALS — BP 156/80 | HR 80 | Temp 97.3°F | Resp 17 | Wt 180.2 lb

## 2020-03-27 DIAGNOSIS — F329 Major depressive disorder, single episode, unspecified: Secondary | ICD-10-CM | POA: Diagnosis not present

## 2020-03-27 DIAGNOSIS — M1990 Unspecified osteoarthritis, unspecified site: Secondary | ICD-10-CM | POA: Diagnosis not present

## 2020-03-27 DIAGNOSIS — Z79899 Other long term (current) drug therapy: Secondary | ICD-10-CM | POA: Diagnosis not present

## 2020-03-27 DIAGNOSIS — Z7901 Long term (current) use of anticoagulants: Secondary | ICD-10-CM | POA: Diagnosis not present

## 2020-03-27 DIAGNOSIS — Z86711 Personal history of pulmonary embolism: Secondary | ICD-10-CM | POA: Insufficient documentation

## 2020-03-27 DIAGNOSIS — I2690 Septic pulmonary embolism without acute cor pulmonale: Secondary | ICD-10-CM

## 2020-03-27 DIAGNOSIS — D6862 Lupus anticoagulant syndrome: Secondary | ICD-10-CM | POA: Insufficient documentation

## 2020-03-27 DIAGNOSIS — I2601 Septic pulmonary embolism with acute cor pulmonale: Secondary | ICD-10-CM

## 2020-03-27 LAB — CBC WITH DIFFERENTIAL (CANCER CENTER ONLY)
Abs Immature Granulocytes: 0.03 10*3/uL (ref 0.00–0.07)
Basophils Absolute: 0.1 10*3/uL (ref 0.0–0.1)
Basophils Relative: 1 %
Eosinophils Absolute: 0.2 10*3/uL (ref 0.0–0.5)
Eosinophils Relative: 2 %
HCT: 35.9 % — ABNORMAL LOW (ref 36.0–46.0)
Hemoglobin: 11.3 g/dL — ABNORMAL LOW (ref 12.0–15.0)
Immature Granulocytes: 0 %
Lymphocytes Relative: 55 %
Lymphs Abs: 3.8 10*3/uL (ref 0.7–4.0)
MCH: 26.7 pg (ref 26.0–34.0)
MCHC: 31.5 g/dL (ref 30.0–36.0)
MCV: 84.7 fL (ref 80.0–100.0)
Monocytes Absolute: 0.5 10*3/uL (ref 0.1–1.0)
Monocytes Relative: 7 %
Neutro Abs: 2.5 10*3/uL (ref 1.7–7.7)
Neutrophils Relative %: 35 %
Platelet Count: 331 10*3/uL (ref 150–400)
RBC: 4.24 MIL/uL (ref 3.87–5.11)
RDW: 15.5 % (ref 11.5–15.5)
WBC Count: 7.1 10*3/uL (ref 4.0–10.5)
nRBC: 0 % (ref 0.0–0.2)

## 2020-03-27 LAB — CMP (CANCER CENTER ONLY)
ALT: 12 U/L (ref 0–44)
AST: 20 U/L (ref 15–41)
Albumin: 3.8 g/dL (ref 3.5–5.0)
Alkaline Phosphatase: 95 U/L (ref 38–126)
Anion gap: 6 (ref 5–15)
BUN: 13 mg/dL (ref 8–23)
CO2: 28 mmol/L (ref 22–32)
Calcium: 9.5 mg/dL (ref 8.9–10.3)
Chloride: 107 mmol/L (ref 98–111)
Creatinine: 0.98 mg/dL (ref 0.44–1.00)
GFR, Est AFR Am: >60 mL/min (ref >60)
GFR, Estimated: 58 mL/min — ABNORMAL LOW (ref >60)
Glucose, Bld: 104 mg/dL — ABNORMAL HIGH (ref 70–99)
Potassium: 4.2 mmol/L (ref 3.5–5.1)
Sodium: 141 mmol/L (ref 135–145)
Total Bilirubin: 0.4 mg/dL (ref 0.3–1.2)
Total Protein: 6.3 g/dL — ABNORMAL LOW (ref 6.5–8.1)

## 2020-03-27 NOTE — Progress Notes (Signed)
Hematology and Oncology Follow Up Visit  [name] [id number] 02/26/1946 74 y.o. 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 q day -- maintanence on 09/06/2018  EC ASA 81 mg po q day     Interim History:  [name] is back for follow-up.  She is having a lot of problems with arthritis.  Her knees really are bothering her.  She sees the orthopedist next week.  It sounds like she is going need to have some kind of knee surgery.  She really has a hard time getting up and just walks with a shuffle.  She now is seeing a psychiatrist.  This is helped with her depression.  The coronavirus restrictions really have made a tough on her.  She has not had her coronavirus vaccine yet.  I told her that I thought it would be reasonable for her to get the vaccine.  She has had no issues with the Xarelto and aspirin.  She has had a little bit of bruising but no bleeding.  There is no issues with bowels or bladder.  She has had no obvious leg swelling.  There is no fever.  She has had no chest wall pain.  Overall, her performance status is ECOG 1.   Medications:  Current Outpatient Medications:  .  donepezil (ARICEPT) 5 MG tablet, Take 5 mg by mouth at bedtime., Disp: , Rfl:  .  albuterol (PROAIR HFA) 108 (90 BASE) MCG/ACT inhaler, Inhale 2 puffs into the lungs every 4 (four) hours as needed for wheezing or shortness of breath., Disp: 2 Inhaler, Rfl: prn .  albuterol (PROVENTIL) (2.5 MG/3ML) 0.083% nebulizer solution, Take 3 mLs (2.5 mg total) by nebulization every 6 (six) hours as needed for wheezing or shortness of breath., Disp: 1080 mL, Rfl: 3 .  ALPRAZolam (XANAX) 0.25 MG tablet, Take 1 tablet by mouth at bedtime as needed for anxiety. , Disp: , Rfl:  .  AMLODIPINE BESYLATE PO, Take by mouth., Disp: , Rfl:  .  benzonatate (TESSALON)  200 MG capsule, Take 1 capsule (200 mg total) by mouth 3 (three) times daily as needed for cough., Disp: 30 capsule, Rfl: 5 .  budesonide (PULMICORT) 0.25 MG/2ML nebulizer solution, Take 2 mLs (0.25 mg total) by nebulization 2 (two) times daily. Dx: J45.20, Disp: 120 mL, Rfl: 5 .  buPROPion (WELLBUTRIN XL) 150 MG 24 hr tablet, Take 150 mg by mouth daily., Disp: , Rfl:  .  desvenlafaxine (PRISTIQ) 100 MG 24 hr tablet, Take 100 mg by mouth daily., Disp: , Rfl:  .  dextromethorphan-guaiFENesin (MUCINEX DM) 30-600 MG 12hr tablet, Take 1 tablet by mouth 2 (two) times daily., Disp: 14 tablet, Rfl: 1 .  latanoprost (XALATAN) 0.005 % ophthalmic solution, Place 1 drop into both eyes at bedtime. , Disp: , Rfl: 2 .  levocetirizine (XYZAL) 5 MG tablet, Take 5 mg by mouth every evening., Disp: , Rfl:  .  levothyroxine (SYNTHROID, LEVOTHROID) 50 MCG tablet, Take 1 tablet by mouth daily., Disp: , Rfl:  .  losartan-hydrochlorothiazide (HYZAAR) 100-12.5 MG per tablet, Take 1 tablet by mouth daily.  , Disp: , Rfl:  .  Multiple Vitamin (MULTIVITAMIN) tablet, Take 1 tablet by mouth daily., Disp: , Rfl:  .  pantoprazole (PROTONIX) 40 MG tablet, TK 1 T PO QD, Disp: , Rfl: 0 .  pregabalin (LYRICA) 75 MG capsule,  Take 75 mg by mouth 2 (two) times daily., Disp: , Rfl:  .  ranitidine (ZANTAC) 300 MG tablet, , Disp: , Rfl:  .  rOPINIRole (REQUIP) 2 MG tablet, , Disp: , Rfl: 1 .  senna (SENOKOT) 8.6 MG TABS tablet, Take 8.6 mg by mouth daily., Disp: , Rfl:  .  venlafaxine (EFFEXOR) 75 MG tablet, Take 75 mg by mouth daily., Disp: , Rfl:  .  Vitamin D, Ergocalciferol, (DRISDOL) 50000 units CAPS capsule, Take 50,000 Units by mouth once a week., Disp: , Rfl:  .  XARELTO 10 MG TABS tablet, Take 1 tablet (10 mg total) by mouth daily with supper., Disp: 30 tablet, Rfl: 12  Allergies:  Allergies  Allergen Reactions  . Naproxen Itching and Swelling    Past Medical History, Surgical history, Social history, and Family History  were reviewed and updated.  Review of Systems: Review of Systems  Constitutional: Negative.   HENT:  Negative.   Eyes: Negative.   Respiratory: Negative.   Cardiovascular: Negative.   Gastrointestinal: Negative.   Endocrine: Negative.   Genitourinary: Negative.    Musculoskeletal: Negative.   Skin: Negative.   Neurological: Negative.   Hematological: Negative.   Psychiatric/Behavioral: Negative.     Physical Exam:  weight is 180 lb 4 oz (81.8 kg). Her temporal temperature is 97.3 F (36.3 C) (abnormal). Her blood pressure is 156/80 (abnormal) and her pulse is 80. Her respiration is 17 and oxygen saturation is 100%.   Wt Readings from Last 3 Encounters:  03/27/20 180 lb 4 oz (81.8 kg)  12/03/19 177 lb (80.3 kg)  11/26/19 179 lb (81.2 kg)    Physical Exam Vitals reviewed.  HENT:     Head: Normocephalic and atraumatic.  Eyes:     Pupils: Pupils are equal, round, and reactive to light.  Cardiovascular:     Rate and Rhythm: Normal rate and regular rhythm.     Heart sounds: Normal heart sounds.  Pulmonary:     Effort: Pulmonary effort is normal.     Breath sounds: Normal breath sounds.  Abdominal:     General: Bowel sounds are normal.     Palpations: Abdomen is soft.  Musculoskeletal:        General: No tenderness or deformity. Normal range of motion.     Cervical back: Normal range of motion.  Lymphadenopathy:     Cervical: No cervical adenopathy.  Skin:    General: Skin is warm and dry.     Findings: No erythema or rash.  Neurological:     Mental Status: She is alert and oriented to person, place, and time.  Psychiatric:        Behavior: Behavior normal.        Thought Content: Thought content normal.        Judgment: Judgment normal.      Lab Results  Component Value Date   WBC 7.1 03/27/2020   HGB 11.3 (L) 03/27/2020   HCT 35.9 (L) 03/27/2020   MCV 84.7 03/27/2020   PLT 331 03/27/2020     Chemistry      Component Value Date/Time   NA 141  11/26/2019 0740   NA 141 11/03/2015 1117   K 4.2 11/26/2019 0740   CL 107 11/26/2019 0740   CO2 26 11/26/2019 0740   BUN 12 11/26/2019 0740   BUN 22 11/03/2015 1117   CREATININE 1.02 (H) 11/26/2019 0740      Component Value Date/Time   CALCIUM 9.5 11/26/2019 0740  ALKPHOS 87 11/26/2019 0740   AST 19 11/26/2019 0740   ALT 14 11/26/2019 0740   BILITOT 0.5 11/26/2019 0740       Impression and Plan: [name] is a 74 year old white female with a pulmonary emboli and lower extremity thromboembolic disease.  She has a transiently  positive lupus anticoagulant.  Last time we checked her lupus anticoagulant back in March, it was negative.  I am a little disappointed about her arthritis and how it is affecting her quality of life.  This clearly is the big problem for her.  I told her that I really do not see any issue with her having knee surgery.  And we can work with the orthopedist regarding the anticoagulation.  We will plan to get her back in another 4 months or so.      Volanda Napoleon, MD 4/9/20218:22 AM

## 2020-03-27 NOTE — Telephone Encounter (Signed)
Appointments scheduled calendar printed per 4/9 los 

## 2020-05-26 NOTE — Progress Notes (Signed)
PCP - Blair Heys PA-C Cardiologist - no Hematologist Dr. Marin Olp clearance on chart 05-19-20  PPM/ICD -  Device Orders -  Rep Notified -   Chest x-ray -  EKG -  Stress Test -  ECHO -  Cardiac Cath -   Sleep Study -  CPAP -   Fasting Blood Sugar -  Checks Blood Sugar _____ times a day  Blood Thinner Instructions:Xarelto hold 2 days prior  Aspirin Instructions:  ERAS Protcol - PRE-SURGERY Ensure or G2-   COVID TEST-    Anesthesia review: Hx PE , asthma , HTN  Patient denies shortness of breath, fever, cough and chest pain at PAT appointment   none   All instructions explained to the patient, with a verbal understanding of the material. Patient agrees to go over the instructions while at home for a better understanding. Patient also instructed to self quarantine after being tested for COVID-19. The opportunity to ask questions was provided.

## 2020-05-26 NOTE — Patient Instructions (Addendum)
DUE TO COVID-19 ONLY ONE VISITOR IS ALLOWED TO COME WITH YOU AND STAY IN THE WAITING ROOM ONLY DURING PRE OP AND PROCEDURE DAY OF SURGERY. TWO  VISITOR MAY VISIT WITH YOU AFTER SURGERY IN YOUR PRIVATE ROOM DURING VISITING HOURS ONLY!  10a-8p  YOU NEED TO HAVE A COVID 19 TEST ON _6-17_-21_____ @__11 :30_____, THIS TEST MUST BE DONE BEFORE SURGERY, COME  [address], [place] , [address].  ([place]) ONCE YOUR COVID TEST IS COMPLETED, PLEASE BEGIN THE QUARANTINE INSTRUCTIONS AS OUTLINED IN YOUR HANDOUT.                [name]  05/26/2020   Your procedure is scheduled on: 06-08-20   Report to [facility] Main  Entrance   Report to admitting at      0655 AM     Call this number if you have problems the morning of surgery [phone]    Remember:no food or drink after midnight_____________________________________________________________________    [place], NO CHEWING GUM CANDY OR MINTS.     Take these medicines the morning of surgery with A SIP OF WATER: effexor, lyrica, protonix, flonase, inhalers and bring them with you, wellbutrin,             amlodipine                                 You may not have any metal on your body including hair pins and              piercings  Do not wear jewelry, make-up, lotions, powders or perfumes, deodorant             Do not wear nail polish on your fingernails.  Do not shave  48 hours prior to surgery.             Do not bring valuables to the hospital. [place].  Contacts, dentures or bridgework may not be worn into surgery.      Patients discharged the day of surgery will not be allowed to drive home. IF YOU ARE HAVING SURGERY AND GOING HOME THE SAME DAY, YOU MUST HAVE AN ADULT TO DRIVE YOU HOME AND BE WITH YOU FOR 24 HOURS. YOU MAY GO HOME BY TAXI OR UBER OR ORTHERWISE, BUT AN ADULT MUST ACCOMPANY  YOU HOME AND STAY WITH YOU FOR 24 HOURS.  Name and phone number of your driver:  Special Instructions: N/A              Please read over the following fact sheets you were given: _____________________________________________________________________             [facility] - Preparing for Surgery Before surgery, you can play an important role.  Because skin is not sterile, your skin needs to be as free of germs as possible.  You can reduce the number of germs on your skin by washing with CHG (chlorahexidine gluconate) soap before surgery.  CHG is an antiseptic cleaner which kills germs and bonds with the skin to continue killing germs even after washing. Please DO NOT use if you have an allergy to CHG or antibacterial soaps.  If your skin becomes reddened/irritated stop using the CHG and inform your nurse  when you arrive at Short Stay. Do not shave (including legs and underarms) for at least 48 hours prior to the first CHG shower.  You may shave your face/neck. Please follow these instructions carefully:  1.  Shower with CHG Soap the night before surgery and the  morning of Surgery.  2.  If you choose to wash your hair, wash your hair first as usual with your  normal  shampoo.  3.  After you shampoo, rinse your hair and body thoroughly to remove the  shampoo.                           4.  Use CHG as you would any other liquid soap.  You can apply chg directly  to the skin and wash                       Gently with a scrungie or clean washcloth.  5.  Apply the CHG Soap to your body ONLY FROM THE NECK DOWN.   Do not use on face/ open                           Wound or open sores. Avoid contact with eyes, ears mouth and genitals (private parts).                       Wash face,  Genitals (private parts) with your normal soap.             6.  Wash thoroughly, paying special attention to the area where your surgery  will be performed.  7.  Thoroughly rinse your body with warm water from the neck  down.  8.  DO NOT shower/wash with your normal soap after using and rinsing off  the CHG Soap.                9.  Pat yourself dry with a clean towel.            10.  Wear clean pajamas.            11.  Place clean sheets on your bed the night of your first shower and do not  sleep with pets. Day of Surgery : Do not apply any lotions/deodorants the morning of surgery.  Please wear clean clothes to the hospital/surgery center.  FAILURE TO FOLLOW THESE INSTRUCTIONS MAY RESULT IN THE CANCELLATION OF YOUR SURGERY PATIENT SIGNATURE_________________________________  NURSE SIGNATURE__________________________________  ________________________________________________________________________   [name]  An incentive spirometer is a tool that can help keep your lungs clear and active. This tool measures how well you are filling your lungs with each breath. Taking long deep breaths may help reverse or decrease the chance of developing breathing (pulmonary) problems (especially infection) following:  A long period of time when you are unable to move or be active. BEFORE THE PROCEDURE   If the spirometer includes an indicator to show your best effort, your nurse or respiratory therapist will set it to a desired goal.  If possible, sit up straight or lean slightly forward. Try not to slouch.  Hold the incentive spirometer in an upright position. INSTRUCTIONS FOR USE  1. Sit on the edge of your bed if possible, or sit up as far as you can in bed or on a chair. 2. Hold the incentive spirometer in an upright position. 3. Breathe out  normally. 4. Place the mouthpiece in your mouth and seal your lips tightly around it. 5. Breathe in slowly and as deeply as possible, raising the piston or the ball toward the top of the column. 6. Hold your breath for 3-5 seconds or for as long as possible. Allow the piston or ball to fall to the bottom of the column. 7. Remove the mouthpiece from your mouth  and breathe out normally. 8. Rest for a few seconds and repeat Steps 1 through 7 at least 10 times every 1-2 hours when you are awake. Take your time and take a few normal breaths between deep breaths. 9. The spirometer may include an indicator to show your best effort. Use the indicator as a goal to work toward during each repetition. 10. After each set of 10 deep breaths, practice coughing to be sure your lungs are clear. If you have an incision (the cut made at the time of surgery), support your incision when coughing by placing a pillow or rolled up towels firmly against it. Once you are able to get out of bed, walk around indoors and cough well. You may stop using the incentive spirometer when instructed by your caregiver.  RISKS AND COMPLICATIONS  Take your time so you do not get dizzy or light-headed.  If you are in pain, you may need to take or ask for pain medication before doing incentive spirometry. It is harder to take a deep breath if you are having pain. AFTER USE  Rest and breathe slowly and easily.  It can be helpful to keep track of a log of your progress. Your caregiver can provide you with a simple table to help with this. If you are using the spirometer at home, follow these instructions: [place] IF:   You are having difficultly using the spirometer.  You have trouble using the spirometer as often as instructed.  Your pain medication is not giving enough relief while using the spirometer.  You develop fever of 100.5 F (38.1 C) or higher. SEEK IMMEDIATE MEDICAL CARE IF:   You cough up bloody sputum that had not been present before.  You develop fever of 102 F (38.9 C) or greater.  You develop worsening pain at or near the incision site. MAKE SURE YOU:   Understand these instructions.  Will watch your condition.  Will get help right away if you are not doing well or get worse. Document Released: 04/17/2007 Document Revised: 02/27/2012 Document  Reviewed: 06/18/2007 [facility] Patient Information 2014 [place], [place].   ________________________________________________________________________

## 2020-05-27 ENCOUNTER — Encounter (HOSPITAL_COMMUNITY)
Admission: RE | Admit: 2020-05-27 | Discharge: 2020-05-27 | Disposition: A | Discharge status: Home / Self Care | Payer: Medicare Other | Source: Ambulatory Visit | Attending: Orthopedic Surgery | Admitting: Orthopedic Surgery

## 2020-05-27 ENCOUNTER — Other Ambulatory Visit: Payer: Self-pay

## 2020-05-27 ENCOUNTER — Encounter (HOSPITAL_COMMUNITY): Payer: Self-pay

## 2020-05-27 DIAGNOSIS — Z01818 Encounter for other preprocedural examination: Secondary | ICD-10-CM | POA: Insufficient documentation

## 2020-05-27 HISTORY — DX: Unspecified glaucoma: H40.9

## 2020-05-27 HISTORY — DX: Nausea with vomiting, unspecified: R11.2

## 2020-05-27 HISTORY — DX: Acute embolism and thrombosis of unspecified deep veins of unspecified lower extremity: I82.409

## 2020-05-27 HISTORY — DX: Pneumonia, unspecified organism: J18.9

## 2020-05-27 HISTORY — DX: Other specified postprocedural states: Z98.890

## 2020-05-28 ENCOUNTER — Encounter (HOSPITAL_COMMUNITY)
Admission: RE | Admit: 2020-05-28 | Discharge: 2020-05-28 | Disposition: A | Discharge status: Home / Self Care | Payer: Medicare Other | Source: Ambulatory Visit | Attending: Orthopedic Surgery | Admitting: Orthopedic Surgery

## 2020-05-28 DIAGNOSIS — Z01818 Encounter for other preprocedural examination: Secondary | ICD-10-CM | POA: Insufficient documentation

## 2020-05-28 LAB — CBC
HCT: 38.7 % (ref 36.0–46.0)
Hemoglobin: 12 g/dL (ref 12.0–15.0)
MCH: 26.7 pg (ref 26.0–34.0)
MCHC: 31 g/dL (ref 30.0–36.0)
MCV: 86 fL (ref 80.0–100.0)
Platelets: 321 10*3/uL (ref 150–400)
RBC: 4.5 MIL/uL (ref 3.87–5.11)
RDW: 14.9 % (ref 11.5–15.5)
WBC: 7 10*3/uL (ref 4.0–10.5)
nRBC: 0 % (ref 0.0–0.2)

## 2020-05-28 LAB — BASIC METABOLIC PANEL
Anion gap: 10 (ref 5–15)
BUN: 12 mg/dL (ref 8–23)
CO2: 25 mmol/L (ref 22–32)
Calcium: 9.3 mg/dL (ref 8.9–10.3)
Chloride: 105 mmol/L (ref 98–111)
Creatinine, Ser: 1.12 mg/dL — ABNORMAL HIGH (ref 0.44–1.00)
GFR calc Af Amer: 57 mL/min — ABNORMAL LOW (ref >60)
GFR calc non Af Amer: 49 mL/min — ABNORMAL LOW (ref >60)
Glucose, Bld: 98 mg/dL (ref 70–99)
Potassium: 5.2 mmol/L — ABNORMAL HIGH (ref 3.5–5.1)
Sodium: 140 mmol/L (ref 135–145)

## 2020-05-28 LAB — SURGICAL PCR SCREEN
MRSA, PCR: NEGATIVE
Staphylococcus aureus: NEGATIVE

## 2020-05-29 ENCOUNTER — Other Ambulatory Visit: Payer: Self-pay | Admitting: Orthopedic Surgery

## 2020-05-29 NOTE — Progress Notes (Signed)
Anesthesia Chart Review   Case: [id number] Date/Time: 06/08/20 0910   Procedure: TOTAL KNEE ARTHROPLASTY (Right Knee)   Anesthesia type: Spinal   Pre-op diagnosis: Osteoarthritis right knee   Location: [facility] ROOM 06 / [facility]   Surgeons: Vickey Huger, MD      DISCUSSION:74 y.o. never smoker with h/o PONV, HTN, GERD, hypothyroidism, asthma, bilateral PE 02/21/2018, DVT (on Xarelto), right knee OA scheduled for above procedure 06/08/2020 with Dr. Vickey Huger.    Pt last seen by hematologist, Dr. Marin Olp, 03/27/2020.  Per OV ntoe, "I told her that I really do not see any issue with her having knee surgery. And we can work with orthopedist regarding the anticoagulation."  Clearance received, on chart, which states pt is optimized from hematology standpoint.  Advised to hold Xarelto 2 days prior to procedure.  Anesthesia type spinal.  Surgeon made aware of hematology recommendations regarding Xarelto.    Pt last seen by pulmonologist, Dr. Annamaria Boots, 12/03/2019.  Per OV note asthma controlled without exacerbation.  She denies cough, shortness of breath at PAT visit.  VS: BP (!) 159/63   Pulse 68   Temp 36.8 C (Oral)   Resp 18   Ht 5\' 2"  (1.575 m)   Wt 81.4 kg   SpO2 99%   BMI 32.84 kg/m   PROVIDERS: Blair Heys, PA-C is PCP    LABS: Labs reviewed: Acceptable for surgery. (all labs ordered are listed, but only abnormal results are displayed)  Labs Reviewed  BASIC METABOLIC PANEL - Abnormal; Notable for the following components:      Result Value   Potassium 5.2 (*)    Creatinine, Ser 1.12 (*)    GFR calc non Af Amer 49 (*)    GFR calc Af Amer 57 (*)    All other components within normal limits  SURGICAL PCR SCREEN  CBC     IMAGES:   EKG: 05/28/2020 Rate 80 bpm Normal sinus rhythm Minimal voltage criteria for LVH, may be normal variant ( R in aVL ) Borderline ECG  CV: Echo 02/22/2018 Study Conclusions   - Left ventricle: The cavity size was normal. There was mild focal  basal  hypertrophy of the septum. Systolic function was normal.  The estimated ejection fraction was in the range of 60% to 65%.  Wall motion was normal; there were no regional wall motion  abnormalities. Doppler parameters are consistent with abnormal  left ventricular relaxation (grade 1 diastolic dysfunction).  - Aortic valve: There was trivial regurgitation.  - Right ventricle: The cavity size was moderately dilated. Systolic  function was severely reduced.  - Tricuspid valve: There was moderate regurgitation.  - Pulmonary arteries: Systolic pressure was moderately increased.  PA peak pressure: 54 mm Hg (S).  Past Medical History:  Diagnosis Date  . Allergic rhinitis   . Allergic rhinitis   . Anxiety   . Arthritis    "hands, feet, shoulders, arms" (02/22/2018)  . Bilateral pulmonary embolism ([place]) 02/21/2018  . Chronic cough   . Depression   . Dupuytren contracture    "both feet, both hands" (02/22/2018)  . DVT (deep venous thrombosis) ([facility])    bil le  . GERD (gastroesophageal reflux disease)   . Glaucoma   . Hypertension   . Hypothyroidism   . Mild persistent asthma    cougher not a wheezr gets chokes easily  . Pneumonia   . PONV (postoperative nausea and vomiting)     Past Surgical History:  Procedure Laterality Date  .  BLADDER SUSPENSION  X 2  . BUNIONECTOMY     RIGHT FOOT ARCH REPAIRED AS WELL  . CATARACT EXTRACTION W/ INTRAOCULAR LENS  IMPLANT, BILATERAL Bilateral   . DILATION AND CURETTAGE OF UTERUS  X 3  . DUPUYTREN CONTRACTURE RELEASE Bilateral    "feet"  . FASCIECTOMY Right 01/23/2018   Procedure: SEGMENTAL FASCIECTOMY RIGHT MIDDLE FINGER;  Surgeon: Daryll Brod, MD;  Location: [facility];  Service: Orthopedics;  Laterality: Right;  AXILLARY BLOCK  . KNEE ARTHROSCOPY Right   . REDUCTION MAMMAPLASTY Bilateral 2018  . TOTAL ABDOMINAL HYSTERECTOMY  1975   "got pregnant w/IUD in then lost baby"    MEDICATIONS: . albuterol (PROAIR HFA) 108 (90  BASE) MCG/ACT inhaler  . albuterol (PROVENTIL) (2.5 MG/3ML) 0.083% nebulizer solution  . ALPRAZolam (XANAX) 0.5 MG tablet  . amLODipine (NORVASC) 10 MG tablet  . Apoaequorin (PREVAGEN EXTRA STRENGTH PO)  . benzonatate (TESSALON) 200 MG capsule  . budesonide (PULMICORT) 0.25 MG/2ML nebulizer solution  . buPROPion (WELLBUTRIN XL) 150 MG 24 hr tablet  . donepezil (ARICEPT) 5 MG tablet  . fluticasone (FLONASE) 50 MCG/ACT nasal spray  . Fluticasone-Salmeterol (ADVAIR DISKUS) 250-50 MCG/DOSE AEPB  . latanoprost (XALATAN) 0.005 % ophthalmic solution  . levothyroxine (SYNTHROID, LEVOTHROID) 50 MCG tablet  . montelukast (SINGULAIR) 10 MG tablet  . pantoprazole (PROTONIX) 40 MG tablet  . pregabalin (LYRICA) 75 MG capsule  . rOPINIRole (REQUIP) 2 MG tablet  . senna (SENOKOT) 8.6 MG TABS tablet  . venlafaxine XR (EFFEXOR-XR) 75 MG 24 hr capsule  . Vitamin D, Ergocalciferol, (DRISDOL) 50000 units CAPS capsule  . XARELTO 10 MG TABS tablet   No current facility-administered medications for this encounter.     Maia Plan [facility] Pre-Surgical Testing [phone] 05/29/20  3:58 PM

## 2020-06-02 ENCOUNTER — Encounter: Payer: Self-pay | Admitting: Internal Medicine

## 2020-06-02 ENCOUNTER — Ambulatory Visit (INDEPENDENT_AMBULATORY_CARE_PROVIDER_SITE_OTHER): Payer: Medicare Other | Admitting: Internal Medicine

## 2020-06-02 ENCOUNTER — Other Ambulatory Visit: Payer: Self-pay

## 2020-06-02 ENCOUNTER — Ambulatory Visit (INDEPENDENT_AMBULATORY_CARE_PROVIDER_SITE_OTHER): Payer: Medicare Other

## 2020-06-02 VITALS — BP 130/70 | HR 73 | Temp 98.3°F | Ht 62.0 in | Wt 180.4 lb

## 2020-06-02 DIAGNOSIS — K219 Gastro-esophageal reflux disease without esophagitis: Secondary | ICD-10-CM

## 2020-06-02 DIAGNOSIS — J4520 Mild intermittent asthma, uncomplicated: Secondary | ICD-10-CM

## 2020-06-02 DIAGNOSIS — I2690 Septic pulmonary embolism without acute cor pulmonale: Secondary | ICD-10-CM

## 2020-06-02 NOTE — Assessment & Plan Note (Signed)
Emphasis on continuing reflux precautions.

## 2020-06-02 NOTE — Assessment & Plan Note (Signed)
History of PE, on Xarelto.  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

## 2020-06-02 NOTE — Patient Instructions (Signed)
Ok to continue current meds.  Ok to use a throat lozenge to help soothe throat. - some of our patients have done well with Anise Salvo diabetic candies  Get your Covid vaccine as soon as you can.  Order- CXR   Dx asthma mild intermittent  Good luck with your knee surgery.  Please call if we can help

## 2020-06-02 NOTE — Progress Notes (Signed)
HPI F never smoker followed for allergic rhinitis, asthma, complicated by GERD, HBP, glaucoma, PE/ R DVT 02/22/18/ Xarelto/+ lupus anticoagulant, CKD3 PFT 07/17/20- WNL --------------------------------------------------------------------------------------   05/30/2019- 74 year old female never smoker followed for allergic rhinitis, asthma, complicated by GERD, HBP, glaucoma, PE/ R DVT 02/22/18/ Xarelto/+ lupus anticoagulant, CKD3 -----Feels like has increased SOB with exertion.States she is realizing she is holding breath alot. Cough has improved. Has some labored breathing during recent endoscopy.. Only needing rescue inhaler 1-2 times a week. Agrees to PFT  06/02/20- 74 year old female never smoker followed for allergic rhinitis, asthma, complicated by GERD, HBP, glaucoma, PE/ R DVT 02/22/18/ Xarelto/+ lupus anticoagulant, CKD3 For TKR 06/08/20 Xarelto, Neb Pulmicort/ albuterol, Flonase, Singulair, Proair hfa, Advair 250,  Has not had Covax yet- encouraged to go ahead.  ACT score 17 Notes that she is very sensitive to odors. Pending R TKR later this month. No acute breathing issues.  Has had significant GERD work-up and knows reflux precautions.  PFT 07/17/20- WNL  ROS- see HPI + = positive Constitutional:   No-   weight loss, night sweats, fevers, chills, fatigue, lassitude. HEENT:   No-  headaches, difficulty swallowing, tooth/dental problems, sore throat,       No-  sneezing, itching, ear ache,    +nasal congestion, no-post nasal drip,  CV:  No-   chest pain, orthopnea, PND, swelling in lower extremities, anasarca, dizziness, palpitations Resp: +  shortness of breath with exertion or at rest.              No-  productive cough,  + non-productive cough,  No- coughing up of blood.              No-   change in color of mucus.  + wheezing.   Skin: No-   rash or lesions. GI:  No-   +heartburn, indigestion, no-abdominal pain, nausea, vomiting, GU: MS: +  joint pain or swelling.   Neuro-      nothing unusual Psych:  No- change in mood or affect. No- depression or anxiety.  No memory loss.  OBJ General- Alert, Oriented, Affect-appropriate, Distress- none acute, + overweight Skin- rash-none, lesions- none, excoriation- none Lymphadenopathy- none Head- atraumatic            Eyes- Gross vision intact, PERRLA, conjunctivae clear secretions            Ears- Hearing, canals-normal- minor wax.            Nose- turbinate edema, no-Septal dev, mucus, polyps, erosion, perforation             Throat- Mallampati II , mucosa clear , drainage- none seen, tonsils- atrophic ,   Neck- flexible , trachea midline, no stridor , thyroid nl, carotid no bruit Chest - symmetrical excursion , unlabored           Heart/CV- RRR , no murmur , no gallop  , no rub, nl s1 s2                           - JVD- none , edema- none, stasis changes- none, varices- none           Lung- clear to P&A,  Cough+dry with deep breath, dullness-none, rub- none, wheeze-none           Chest wall-  Abd-  Br/ Gen/ Rectal- Not done, not indicated Extrem- cyanosis- none, clubbing, none, atrophy- none, strength- nl. + Osteoarthritic deformity of fingers.  Neuro- grossly intact to observation

## 2020-06-02 NOTE — Assessment & Plan Note (Signed)
Mild intermittent, uncomplicated Plan- continue present meds

## 2020-06-04 ENCOUNTER — Other Ambulatory Visit (HOSPITAL_COMMUNITY)
Admission: RE | Admit: 2020-06-04 | Discharge: 2020-06-04 | Disposition: A | Discharge status: Home / Self Care | Payer: Medicare Other | Source: Ambulatory Visit | Attending: Orthopedic Surgery | Admitting: Orthopedic Surgery

## 2020-06-04 DIAGNOSIS — Z20822 Contact with and (suspected) exposure to covid-19: Secondary | ICD-10-CM | POA: Diagnosis not present

## 2020-06-04 DIAGNOSIS — Z01812 Encounter for preprocedural laboratory examination: Secondary | ICD-10-CM | POA: Diagnosis present

## 2020-06-04 LAB — SARS CORONAVIRUS 2 (TAT 6-24 HRS): SARS Coronavirus 2: NEGATIVE

## 2020-06-07 MED ORDER — BUPIVACAINE LIPOSOME 1.3 % IJ SUSP
20.0000 mL | INTRAMUSCULAR | Status: DC
  Filled 2020-06-07: qty 20

## 2020-06-08 ENCOUNTER — Ambulatory Visit (HOSPITAL_COMMUNITY): Payer: Medicare Other | Admitting: Physician Assistant

## 2020-06-08 ENCOUNTER — Encounter (HOSPITAL_COMMUNITY): Payer: Self-pay | Admitting: Orthopedic Surgery

## 2020-06-08 ENCOUNTER — Ambulatory Visit (HOSPITAL_COMMUNITY): Payer: Medicare Other | Admitting: Anesthesiology

## 2020-06-08 ENCOUNTER — Other Ambulatory Visit: Payer: Self-pay

## 2020-06-08 ENCOUNTER — Observation Stay (HOSPITAL_COMMUNITY)
Admission: RE | Admit: 2020-06-08 | Discharge: 2020-06-09 | Disposition: A | Discharge status: Home / Self Care | Payer: Medicare Other | Source: Ambulatory Visit | Attending: Orthopedic Surgery | Admitting: Orthopedic Surgery

## 2020-06-08 ENCOUNTER — Encounter (HOSPITAL_COMMUNITY)
Admission: RE | Disposition: A | Discharge status: Home / Self Care | Payer: Self-pay | Source: Ambulatory Visit | Attending: Orthopedic Surgery

## 2020-06-08 DIAGNOSIS — Z886 Allergy status to analgesic agent status: Secondary | ICD-10-CM | POA: Insufficient documentation

## 2020-06-08 DIAGNOSIS — Z86718 Personal history of other venous thrombosis and embolism: Secondary | ICD-10-CM | POA: Insufficient documentation

## 2020-06-08 DIAGNOSIS — J4530 Mild persistent asthma, uncomplicated: Secondary | ICD-10-CM | POA: Insufficient documentation

## 2020-06-08 DIAGNOSIS — N183 Chronic kidney disease, stage 3 unspecified: Secondary | ICD-10-CM | POA: Diagnosis not present

## 2020-06-08 DIAGNOSIS — Z96659 Presence of unspecified artificial knee joint: Secondary | ICD-10-CM

## 2020-06-08 DIAGNOSIS — M1711 Unilateral primary osteoarthritis, right knee: Secondary | ICD-10-CM | POA: Diagnosis present

## 2020-06-08 DIAGNOSIS — E1122 Type 2 diabetes mellitus with diabetic chronic kidney disease: Secondary | ICD-10-CM | POA: Insufficient documentation

## 2020-06-08 DIAGNOSIS — Z86711 Personal history of pulmonary embolism: Secondary | ICD-10-CM | POA: Diagnosis not present

## 2020-06-08 DIAGNOSIS — I130 Hypertensive heart and chronic kidney disease with heart failure and stage 1 through stage 4 chronic kidney disease, or unspecified chronic kidney disease: Secondary | ICD-10-CM | POA: Diagnosis not present

## 2020-06-08 HISTORY — PX: TOTAL KNEE ARTHROPLASTY: SHX125

## 2020-06-08 LAB — CBC WITH DIFFERENTIAL/PLATELET
Abs Immature Granulocytes: 0.03 10*3/uL (ref 0.00–0.07)
Basophils Absolute: 0.1 10*3/uL (ref 0.0–0.1)
Basophils Relative: 1 %
Eosinophils Absolute: 0.2 10*3/uL (ref 0.0–0.5)
Eosinophils Relative: 2 %
HCT: 39 % (ref 36.0–46.0)
Hemoglobin: 12.2 g/dL (ref 12.0–15.0)
Immature Granulocytes: 0 %
Lymphocytes Relative: 53 %
Lymphs Abs: 5.2 10*3/uL — ABNORMAL HIGH (ref 0.7–4.0)
MCH: 26.8 pg (ref 26.0–34.0)
MCHC: 31.3 g/dL (ref 30.0–36.0)
MCV: 85.5 fL (ref 80.0–100.0)
Monocytes Absolute: 0.7 10*3/uL (ref 0.1–1.0)
Monocytes Relative: 7 %
Neutro Abs: 3.6 10*3/uL (ref 1.7–7.7)
Neutrophils Relative %: 37 %
Platelets: 305 10*3/uL (ref 150–400)
RBC: 4.56 MIL/uL (ref 3.87–5.11)
RDW: 15 % (ref 11.5–15.5)
WBC: 9.8 10*3/uL (ref 4.0–10.5)
nRBC: 0 % (ref 0.0–0.2)

## 2020-06-08 LAB — COMPREHENSIVE METABOLIC PANEL
ALT: 15 U/L (ref 0–44)
AST: 35 U/L (ref 15–41)
Albumin: 3.9 g/dL (ref 3.5–5.0)
Alkaline Phosphatase: 98 U/L (ref 38–126)
Anion gap: 12 (ref 5–15)
BUN: 13 mg/dL (ref 8–23)
CO2: 22 mmol/L (ref 22–32)
Calcium: 9.1 mg/dL (ref 8.9–10.3)
Chloride: 106 mmol/L (ref 98–111)
Creatinine, Ser: 1.07 mg/dL — ABNORMAL HIGH (ref 0.44–1.00)
GFR calc Af Amer: >60 mL/min (ref >60)
GFR calc non Af Amer: 52 mL/min — ABNORMAL LOW (ref >60)
Glucose, Bld: 100 mg/dL — ABNORMAL HIGH (ref 70–99)
Potassium: 4.5 mmol/L (ref 3.5–5.1)
Sodium: 140 mmol/L (ref 135–145)
Total Bilirubin: 1.2 mg/dL (ref 0.3–1.2)
Total Protein: 6.9 g/dL (ref 6.5–8.1)

## 2020-06-08 SURGERY — ARTHROPLASTY, KNEE, TOTAL
Anesthesia: Spinal | Site: Knee | Laterality: Right

## 2020-06-08 MED ORDER — DIPHENHYDRAMINE HCL 12.5 MG/5ML PO ELIX: 12.5000 mg | ORAL_SOLUTION | ORAL | Status: DC | PRN

## 2020-06-08 MED ORDER — RIVAROXABAN 10 MG PO TABS
10.0000 mg | ORAL_TABLET | Freq: Every day | ORAL | Status: DC
  Administered 2020-06-09: 10 mg via ORAL
  Filled 2020-06-08: qty 1

## 2020-06-08 MED ORDER — PANTOPRAZOLE SODIUM 40 MG PO TBEC
40.0000 mg | DELAYED_RELEASE_TABLET | Freq: Every day | ORAL | Status: DC
  Administered 2020-06-09: 40 mg via ORAL
  Filled 2020-06-08: qty 1

## 2020-06-08 MED ORDER — MOMETASONE FURO-FORMOTEROL FUM 200-5 MCG/ACT IN AERO
2.0000 | INHALATION_SPRAY | Freq: Two times a day (BID) | RESPIRATORY_TRACT | Status: DC
  Administered 2020-06-08 – 2020-06-09 (×2): 2 via RESPIRATORY_TRACT
  Filled 2020-06-08: qty 8.8

## 2020-06-08 MED ORDER — BISACODYL 5 MG PO TBEC: 5.0000 mg | DELAYED_RELEASE_TABLET | Freq: Every day | ORAL | Status: DC | PRN

## 2020-06-08 MED ORDER — ORAL CARE MOUTH RINSE: 15.0000 mL | Freq: Once | OROMUCOSAL | Status: AC

## 2020-06-08 MED ORDER — FENTANYL CITRATE (PF) 100 MCG/2ML IJ SOLN
50.0000 ug | INTRAMUSCULAR | Status: DC
  Administered 2020-06-08: 50 ug via INTRAVENOUS
  Filled 2020-06-08: qty 2

## 2020-06-08 MED ORDER — ONDANSETRON HCL 4 MG/2ML IJ SOLN
INTRAMUSCULAR | Status: AC
  Filled 2020-06-08: qty 2

## 2020-06-08 MED ORDER — FLEET ENEMA 7-19 GM/118ML RE ENEM: 1.0000 | ENEMA | Freq: Once | RECTAL | Status: DC | PRN

## 2020-06-08 MED ORDER — CEFAZOLIN SODIUM-DEXTROSE 2-4 GM/100ML-% IV SOLN
2.0000 g | INTRAVENOUS | Status: AC
  Administered 2020-06-08: 2 g via INTRAVENOUS
  Filled 2020-06-08: qty 100

## 2020-06-08 MED ORDER — EPHEDRINE 5 MG/ML INJ
INTRAVENOUS | Status: AC
  Filled 2020-06-08: qty 10

## 2020-06-08 MED ORDER — BUPIVACAINE HCL 0.25 % IJ SOLN
INTRAMUSCULAR | Status: AC
  Filled 2020-06-08: qty 1

## 2020-06-08 MED ORDER — FENTANYL CITRATE (PF) 100 MCG/2ML IJ SOLN
INTRAMUSCULAR | Status: AC
  Filled 2020-06-08: qty 2

## 2020-06-08 MED ORDER — OXYCODONE HCL 5 MG PO TABS: 5.0000 mg | ORAL_TABLET | Freq: Once | ORAL | Status: DC | PRN

## 2020-06-08 MED ORDER — ONDANSETRON HCL 4 MG/2ML IJ SOLN
INTRAMUSCULAR | Status: DC | PRN
  Administered 2020-06-08: 4 mg via INTRAVENOUS

## 2020-06-08 MED ORDER — WATER FOR IRRIGATION, STERILE IR SOLN
Status: DC | PRN
  Administered 2020-06-08: 2000 mL

## 2020-06-08 MED ORDER — EPHEDRINE SULFATE-NACL 50-0.9 MG/10ML-% IV SOSY
PREFILLED_SYRINGE | INTRAVENOUS | Status: DC | PRN
  Administered 2020-06-08 (×3): 10 mg via INTRAVENOUS

## 2020-06-08 MED ORDER — SENNA 8.6 MG PO TABS
8.6000 mg | ORAL_TABLET | Freq: Every day | ORAL | Status: DC
  Filled 2020-06-08: qty 1

## 2020-06-08 MED ORDER — SODIUM CHLORIDE 0.9 % IV SOLN: INTRAVENOUS | Status: DC

## 2020-06-08 MED ORDER — SODIUM CHLORIDE 0.9 % IR SOLN
Status: DC | PRN
  Administered 2020-06-08: 1000 mL

## 2020-06-08 MED ORDER — PANTOPRAZOLE SODIUM 40 MG PO TBEC: 40.0000 mg | DELAYED_RELEASE_TABLET | Freq: Every day | ORAL | Status: DC

## 2020-06-08 MED ORDER — ALUM & MAG HYDROXIDE-SIMETH 200-200-20 MG/5ML PO SUSP: 30.0000 mL | ORAL | Status: DC | PRN

## 2020-06-08 MED ORDER — CHLORHEXIDINE GLUCONATE 0.12 % MT SOLN
15.0000 mL | Freq: Once | OROMUCOSAL | Status: AC
  Administered 2020-06-08: 15 mL via OROMUCOSAL

## 2020-06-08 MED ORDER — LEVOTHYROXINE SODIUM 50 MCG PO TABS
50.0000 ug | ORAL_TABLET | Freq: Every day | ORAL | Status: DC
  Administered 2020-06-09: 50 ug via ORAL
  Filled 2020-06-08: qty 1

## 2020-06-08 MED ORDER — KETAMINE HCL 10 MG/ML IJ SOLN
INTRAMUSCULAR | Status: AC
  Filled 2020-06-08: qty 1

## 2020-06-08 MED ORDER — DONEPEZIL HCL 10 MG PO TABS
5.0000 mg | ORAL_TABLET | Freq: Every day | ORAL | Status: DC
  Administered 2020-06-08: 5 mg via ORAL
  Filled 2020-06-08: qty 1

## 2020-06-08 MED ORDER — DEXAMETHASONE SODIUM PHOSPHATE 10 MG/ML IJ SOLN
10.0000 mg | Freq: Once | INTRAMUSCULAR | Status: AC
  Administered 2020-06-09: 10 mg via INTRAVENOUS
  Filled 2020-06-08: qty 1

## 2020-06-08 MED ORDER — BUDESONIDE 0.25 MG/2ML IN SUSP: 0.2500 mg | Freq: Two times a day (BID) | RESPIRATORY_TRACT | Status: DC

## 2020-06-08 MED ORDER — OXYCODONE HCL 5 MG PO TABS
5.0000 mg | ORAL_TABLET | ORAL | Status: DC | PRN
  Administered 2020-06-08 – 2020-06-09 (×4): 10 mg via ORAL
  Filled 2020-06-08: qty 1
  Filled 2020-06-08 (×2): qty 2
  Filled 2020-06-08: qty 1
  Filled 2020-06-08: qty 2

## 2020-06-08 MED ORDER — KETAMINE HCL 10 MG/ML IJ SOLN
INTRAMUSCULAR | Status: DC | PRN
Start: 2020-06-08 — End: 2020-06-08
  Administered 2020-06-08: 40 mg via INTRAVENOUS

## 2020-06-08 MED ORDER — ACETAMINOPHEN 500 MG PO TABS
1000.0000 mg | ORAL_TABLET | Freq: Four times a day (QID) | ORAL | Status: AC
  Administered 2020-06-08 – 2020-06-09 (×3): 1000 mg via ORAL
  Filled 2020-06-08 (×2): qty 2

## 2020-06-08 MED ORDER — LATANOPROST 0.005 % OP SOLN
1.0000 [drp] | Freq: Every day | OPHTHALMIC | Status: DC
  Administered 2020-06-08: 1 [drp] via OPHTHALMIC
  Filled 2020-06-08: qty 2.5

## 2020-06-08 MED ORDER — DEXAMETHASONE SODIUM PHOSPHATE 10 MG/ML IJ SOLN
INTRAMUSCULAR | Status: DC | PRN
  Administered 2020-06-08: 8 mg via INTRAVENOUS

## 2020-06-08 MED ORDER — DEXAMETHASONE SODIUM PHOSPHATE 10 MG/ML IJ SOLN: 8.0000 mg | Freq: Once | INTRAMUSCULAR | Status: DC

## 2020-06-08 MED ORDER — GABAPENTIN 300 MG PO CAPS
300.0000 mg | ORAL_CAPSULE | Freq: Three times a day (TID) | ORAL | Status: DC
  Administered 2020-06-08 – 2020-06-09 (×2): 300 mg via ORAL
  Filled 2020-06-08 (×2): qty 1

## 2020-06-08 MED ORDER — ONDANSETRON HCL 4 MG PO TABS: 4.0000 mg | ORAL_TABLET | Freq: Four times a day (QID) | ORAL | Status: DC | PRN

## 2020-06-08 MED ORDER — TRAMADOL HCL 50 MG PO TABS
50.0000 mg | ORAL_TABLET | Freq: Four times a day (QID) | ORAL | Status: DC
  Administered 2020-06-08 – 2020-06-09 (×2): 50 mg via ORAL
  Filled 2020-06-08 (×2): qty 1

## 2020-06-08 MED ORDER — HYDROMORPHONE HCL 1 MG/ML IJ SOLN: 0.5000 mg | INTRAMUSCULAR | Status: DC | PRN

## 2020-06-08 MED ORDER — ONDANSETRON HCL 4 MG/2ML IJ SOLN: 4.0000 mg | Freq: Four times a day (QID) | INTRAMUSCULAR | Status: DC | PRN

## 2020-06-08 MED ORDER — POVIDONE-IODINE 10 % EX SWAB
2.0000 "application " | Freq: Once | CUTANEOUS | Status: AC
  Administered 2020-06-08: 2 via TOPICAL

## 2020-06-08 MED ORDER — ZOLPIDEM TARTRATE 5 MG PO TABS: 5.0000 mg | ORAL_TABLET | Freq: Every evening | ORAL | Status: DC | PRN

## 2020-06-08 MED ORDER — LACTATED RINGERS IV SOLN: INTRAVENOUS | Status: DC

## 2020-06-08 MED ORDER — TRANEXAMIC ACID-NACL 1000-0.7 MG/100ML-% IV SOLN
1000.0000 mg | INTRAVENOUS | Status: DC
  Filled 2020-06-08: qty 100

## 2020-06-08 MED ORDER — GABAPENTIN 300 MG PO CAPS
300.0000 mg | ORAL_CAPSULE | Freq: Once | ORAL | Status: AC
  Administered 2020-06-08: 300 mg via ORAL
  Filled 2020-06-08: qty 1

## 2020-06-08 MED ORDER — DOCUSATE SODIUM 100 MG PO CAPS
100.0000 mg | ORAL_CAPSULE | Freq: Two times a day (BID) | ORAL | Status: DC
  Administered 2020-06-08 – 2020-06-09 (×2): 100 mg via ORAL
  Filled 2020-06-08 (×2): qty 1

## 2020-06-08 MED ORDER — TRANEXAMIC ACID-NACL 1000-0.7 MG/100ML-% IV SOLN
1000.0000 mg | Freq: Once | INTRAVENOUS | Status: AC
  Administered 2020-06-08: 1000 mg via INTRAVENOUS
  Filled 2020-06-08: qty 100

## 2020-06-08 MED ORDER — MENTHOL 3 MG MT LOZG: 1.0000 | LOZENGE | OROMUCOSAL | Status: DC | PRN

## 2020-06-08 MED ORDER — MONTELUKAST SODIUM 10 MG PO TABS
10.0000 mg | ORAL_TABLET | Freq: Every day | ORAL | Status: DC
  Administered 2020-06-08: 10 mg via ORAL
  Filled 2020-06-08: qty 1

## 2020-06-08 MED ORDER — DEXAMETHASONE SODIUM PHOSPHATE 10 MG/ML IJ SOLN
INTRAMUSCULAR | Status: AC
  Filled 2020-06-08: qty 1

## 2020-06-08 MED ORDER — BUPIVACAINE-EPINEPHRINE 0.25% -1:200000 IJ SOLN
INTRAMUSCULAR | Status: DC | PRN
  Administered 2020-06-08: 30 mL

## 2020-06-08 MED ORDER — LIDOCAINE 2% (20 MG/ML) 5 ML SYRINGE
INTRAMUSCULAR | Status: DC | PRN
  Administered 2020-06-08: 60 mg via INTRAVENOUS

## 2020-06-08 MED ORDER — BUPIVACAINE LIPOSOME 1.3 % IJ SUSP
INTRAMUSCULAR | Status: DC | PRN
  Administered 2020-06-08: 20 mL

## 2020-06-08 MED ORDER — ACETAMINOPHEN 500 MG PO TABS
1000.0000 mg | ORAL_TABLET | Freq: Once | ORAL | Status: AC
  Administered 2020-06-08: 1000 mg via ORAL
  Filled 2020-06-08: qty 2

## 2020-06-08 MED ORDER — LIDOCAINE 2% (20 MG/ML) 5 ML SYRINGE
INTRAMUSCULAR | Status: AC
  Filled 2020-06-08: qty 5

## 2020-06-08 MED ORDER — CEFAZOLIN SODIUM-DEXTROSE 2-4 GM/100ML-% IV SOLN
2.0000 g | Freq: Four times a day (QID) | INTRAVENOUS | Status: AC
  Administered 2020-06-08 (×2): 2 g via INTRAVENOUS
  Filled 2020-06-08 (×2): qty 100

## 2020-06-08 MED ORDER — ALBUTEROL SULFATE (2.5 MG/3ML) 0.083% IN NEBU: 3.0000 mL | INHALATION_SOLUTION | RESPIRATORY_TRACT | Status: DC | PRN

## 2020-06-08 MED ORDER — METOCLOPRAMIDE HCL 5 MG/ML IJ SOLN: 5.0000 mg | Freq: Three times a day (TID) | INTRAMUSCULAR | Status: DC | PRN

## 2020-06-08 MED ORDER — PROPOFOL 10 MG/ML IV BOLUS
INTRAVENOUS | Status: DC | PRN
  Administered 2020-06-08: 140 mg via INTRAVENOUS

## 2020-06-08 MED ORDER — SODIUM CHLORIDE (PF) 0.9 % IJ SOLN
INTRAMUSCULAR | Status: AC
  Filled 2020-06-08: qty 20

## 2020-06-08 MED ORDER — AMLODIPINE BESYLATE 10 MG PO TABS
10.0000 mg | ORAL_TABLET | Freq: Every day | ORAL | Status: DC
  Administered 2020-06-09: 10 mg via ORAL
  Filled 2020-06-08: qty 1

## 2020-06-08 MED ORDER — OXYCODONE HCL 5 MG/5ML PO SOLN: 5.0000 mg | Freq: Once | ORAL | Status: DC | PRN

## 2020-06-08 MED ORDER — ALPRAZOLAM 0.5 MG PO TABS
0.5000 mg | ORAL_TABLET | Freq: Every day | ORAL | Status: DC
  Administered 2020-06-08: 0.5 mg via ORAL
  Filled 2020-06-08: qty 1

## 2020-06-08 MED ORDER — FENTANYL CITRATE (PF) 250 MCG/5ML IJ SOLN
INTRAMUSCULAR | Status: AC
  Filled 2020-06-08: qty 5

## 2020-06-08 MED ORDER — FLUTICASONE PROPIONATE 50 MCG/ACT NA SUSP
1.0000 | Freq: Every day | NASAL | Status: DC
  Administered 2020-06-09: 1 via NASAL
  Filled 2020-06-08: qty 16

## 2020-06-08 MED ORDER — ROPINIROLE HCL 1 MG PO TABS
2.0000 mg | ORAL_TABLET | Freq: Every day | ORAL | Status: DC
  Administered 2020-06-08: 2 mg via ORAL
  Filled 2020-06-08: qty 2

## 2020-06-08 MED ORDER — METOCLOPRAMIDE HCL 5 MG PO TABS: 5.0000 mg | ORAL_TABLET | Freq: Three times a day (TID) | ORAL | Status: DC | PRN

## 2020-06-08 MED ORDER — PROPOFOL 500 MG/50ML IV EMUL
INTRAVENOUS | Status: AC
  Filled 2020-06-08: qty 50

## 2020-06-08 MED ORDER — FENTANYL CITRATE (PF) 100 MCG/2ML IJ SOLN
INTRAMUSCULAR | Status: DC | PRN
  Administered 2020-06-08 (×5): 50 ug via INTRAVENOUS

## 2020-06-08 MED ORDER — SODIUM CHLORIDE 0.9% FLUSH
INTRAVENOUS | Status: DC | PRN
  Administered 2020-06-08: 20 mL

## 2020-06-08 MED ORDER — FERROUS SULFATE 325 (65 FE) MG PO TABS
325.0000 mg | ORAL_TABLET | Freq: Three times a day (TID) | ORAL | Status: DC
  Administered 2020-06-09: 325 mg via ORAL
  Filled 2020-06-08: qty 1

## 2020-06-08 MED ORDER — PHENOL 1.4 % MT LIQD: 1.0000 | OROMUCOSAL | Status: DC | PRN

## 2020-06-08 MED ORDER — FENTANYL CITRATE (PF) 100 MCG/2ML IJ SOLN
25.0000 ug | INTRAMUSCULAR | Status: DC | PRN
  Administered 2020-06-08 (×2): 50 ug via INTRAVENOUS

## 2020-06-08 MED ORDER — CELECOXIB 200 MG PO CAPS
400.0000 mg | ORAL_CAPSULE | Freq: Once | ORAL | Status: AC
  Administered 2020-06-08: 400 mg via ORAL
  Filled 2020-06-08: qty 2

## 2020-06-08 MED ORDER — ROPIVACAINE HCL 7.5 MG/ML IJ SOLN
INTRAMUSCULAR | Status: DC | PRN
Start: 2020-06-08 — End: 2020-06-08
  Administered 2020-06-08: 20 mL via PERINEURAL

## 2020-06-08 MED ORDER — MIDAZOLAM HCL 2 MG/2ML IJ SOLN
1.0000 mg | INTRAMUSCULAR | Status: DC
  Filled 2020-06-08: qty 2

## 2020-06-08 MED ORDER — SENNOSIDES-DOCUSATE SODIUM 8.6-50 MG PO TABS: 1.0000 | ORAL_TABLET | Freq: Every evening | ORAL | Status: DC | PRN

## 2020-06-08 MED ORDER — BUPROPION HCL ER (XL) 150 MG PO TB24
150.0000 mg | ORAL_TABLET | Freq: Every day | ORAL | Status: DC
  Administered 2020-06-09: 150 mg via ORAL
  Filled 2020-06-08: qty 1

## 2020-06-08 MED ORDER — 0.9 % SODIUM CHLORIDE (POUR BTL) OPTIME
TOPICAL | Status: DC | PRN
  Administered 2020-06-08: 1000 mL

## 2020-06-08 MED ORDER — PREGABALIN 75 MG PO CAPS
75.0000 mg | ORAL_CAPSULE | Freq: Two times a day (BID) | ORAL | Status: DC
  Administered 2020-06-08 – 2020-06-09 (×2): 75 mg via ORAL
  Filled 2020-06-08 (×2): qty 1

## 2020-06-08 MED ORDER — ONDANSETRON HCL 4 MG/2ML IJ SOLN: 4.0000 mg | Freq: Once | INTRAMUSCULAR | Status: DC | PRN

## 2020-06-08 MED ORDER — VENLAFAXINE HCL ER 75 MG PO CP24
75.0000 mg | ORAL_CAPSULE | Freq: Every day | ORAL | Status: DC
  Administered 2020-06-09: 75 mg via ORAL
  Filled 2020-06-08: qty 1

## 2020-06-08 SURGICAL SUPPLY — 61 items
BAG SPEC THK2 15X12 ZIP CLS (MISCELLANEOUS) ×1
BAG ZIPLOCK 12X15 (MISCELLANEOUS) ×3 IMPLANT
BLADE SAGITTAL 13X1.27X60 (BLADE) ×2 IMPLANT
BLADE SAGITTAL 13X1.27X60MM (BLADE) ×1
BLADE SAW SGTL 83.5X18.5 (BLADE) ×3 IMPLANT
BLADE SURG 15 STRL LF DISP TIS (BLADE) ×1 IMPLANT
BLADE SURG 15 STRL SS (BLADE) ×3
BLADE SURG SZ10 CARB STEEL (BLADE) ×6 IMPLANT
BNDG ELASTIC 6X5.8 VLCR STR LF (GAUZE/BANDAGES/DRESSINGS) ×3 IMPLANT
BOWL SMART MIX CTS (DISPOSABLE) ×3 IMPLANT
BSPLAT TIB 5D C 16NT STM RT (Knees) ×1 IMPLANT
CEMENT BONE SIMPLEX SPEEDSET (Cement) ×6 IMPLANT
CLOSURE STERI-STRIP 1/2X4 (GAUZE/BANDAGES/DRESSINGS) ×2
CLOSURE WOUND 1/2 X4 (GAUZE/BANDAGES/DRESSINGS) ×1
CLSR STERI-STRIP ANTIMIC 1/2X4 (GAUZE/BANDAGES/DRESSINGS) ×2 IMPLANT
COVER SURGICAL LIGHT HANDLE (MISCELLANEOUS) ×3 IMPLANT
COVER WAND RF STERILE (DRAPES) IMPLANT
CUFF TOURN SGL QUICK 34 (TOURNIQUET CUFF) ×3
CUFF TRNQT CYL 34X4.125X (TOURNIQUET CUFF) ×1 IMPLANT
DECANTER SPIKE VIAL GLASS SM (MISCELLANEOUS) ×6 IMPLANT
DRAPE INCISE IOBAN 66X45 STRL (DRAPES) ×6 IMPLANT
DRAPE U-SHAPE 47X51 STRL (DRAPES) ×3 IMPLANT
DRSG AQUACEL AG ADV 3.5X10 (GAUZE/BANDAGES/DRESSINGS) ×3 IMPLANT
DURAPREP 26ML APPLICATOR (WOUND CARE) ×6 IMPLANT
ELECT REM PT RETURN 15FT ADLT (MISCELLANEOUS) ×3 IMPLANT
FEMORAL KNEE COMPONENT SZ6 RT (Joint) ×2 IMPLANT
GLOVE BIOGEL M STRL SZ7.5 (GLOVE) ×3 IMPLANT
GLOVE BIOGEL PI IND STRL 7.5 (GLOVE) ×1 IMPLANT
GLOVE BIOGEL PI IND STRL 8.5 (GLOVE) ×2 IMPLANT
GLOVE BIOGEL PI INDICATOR 7.5 (GLOVE) ×2
GLOVE BIOGEL PI INDICATOR 8.5 (GLOVE) ×4
GLOVE SURG ORTHO 8.0 STRL STRW (GLOVE) ×9 IMPLANT
GOWN STRL REUS W/ TWL XL LVL3 (GOWN DISPOSABLE) ×2 IMPLANT
GOWN STRL REUS W/TWL XL LVL3 (GOWN DISPOSABLE) ×6
HANDPIECE INTERPULSE COAX TIP (DISPOSABLE) ×3
HOLDER FOLEY CATH W/STRAP (MISCELLANEOUS) ×1 IMPLANT
HOOD PEEL AWAY FLYTE STAYCOOL (MISCELLANEOUS) ×9 IMPLANT
INSERT TIBIAL 13 PLY R 6-9CD (Joint) ×2 IMPLANT
KIT TURNOVER KIT A (KITS) IMPLANT
MANIFOLD NEPTUNE II (INSTRUMENTS) ×3 IMPLANT
NEEDLE HYPO 22GX1.5 SAFETY (NEEDLE) ×3 IMPLANT
NS IRRIG 1000ML POUR BTL (IV SOLUTION) ×3 IMPLANT
PACK TOTAL KNEE CUSTOM (KITS) ×3 IMPLANT
PENCIL SMOKE EVACUATOR (MISCELLANEOUS) ×3 IMPLANT
PROTECTOR NERVE ULNAR (MISCELLANEOUS) ×3 IMPLANT
SET HNDPC FAN SPRY TIP SCT (DISPOSABLE) ×1 IMPLANT
STEM POLY PAT PLY 32M KNEE (Knees) ×2 IMPLANT
STEM TIBIA 5 DEG SZ C R KNEE (Knees) IMPLANT
STRIP CLOSURE SKIN 1/2X4 (GAUZE/BANDAGES/DRESSINGS) ×2 IMPLANT
SUT BONE WAX W31G (SUTURE) ×3 IMPLANT
SUT MNCRL AB 3-0 PS2 18 (SUTURE) ×3 IMPLANT
SUT STRATAFIX 0 PDS 27 VIOLET (SUTURE) ×3
SUT STRATAFIX PDS+ 0 24IN (SUTURE) ×3 IMPLANT
SUT VIC AB 1 CT1 36 (SUTURE) ×3 IMPLANT
SUTURE STRATFX 0 PDS 27 VIOLET (SUTURE) ×1 IMPLANT
SYR CONTROL 10ML LL (SYRINGE) ×6 IMPLANT
TIBIA STEM 5 DEG SZ C R KNEE (Knees) ×3 IMPLANT
TRAY FOLEY MTR SLVR 16FR STAT (SET/KITS/TRAYS/PACK) ×3 IMPLANT
WATER STERILE IRR 1000ML POUR (IV SOLUTION) ×6 IMPLANT
WRAP KNEE MAXI GEL POST OP (GAUZE/BANDAGES/DRESSINGS) ×3 IMPLANT
YANKAUER SUCT BULB TIP 10FT TU (MISCELLANEOUS) ×3 IMPLANT

## 2020-06-08 NOTE — Transfer of Care (Signed)
Immediate Anesthesia Transfer of Care Note  Patient: [name]  Procedure(s) Performed: TOTAL KNEE ARTHROPLASTY (Right Knee)  Patient Location: PACU  Anesthesia Type:GA combined with regional for post-op pain  Level of Consciousness: awake and alert   Airway & Oxygen Therapy: Patient Spontanous Breathing and Patient connected to face mask oxygen  Post-op Assessment: Report given to RN and Post -op Vital signs reviewed and stable  Post vital signs: Reviewed and stable  Last Vitals:  Vitals Value Taken Time  BP    Temp    Pulse 99 06/08/20 1102  Resp 22 06/08/20 1102  SpO2 100 % 06/08/20 1102  Vitals shown include unvalidated device data.  Last Pain:  Vitals:   06/08/20 0918  TempSrc:   PainSc: 0-No pain      Patients Stated Pain Goal: 3 (11/46/43 1427)  Complications: No complications documented.

## 2020-06-08 NOTE — Progress Notes (Signed)
Orthopedic Tech Progress Note Patient Details:  [name] 01/12/1946 [id number]  CPM Right Knee CPM Right Knee: Off Right Knee Flexion (Degrees): 90 Right Knee Extension (Degrees): 0 Additional Comments: Trapeze bar  Post Interventions Patient Tolerated: Well Instructions Provided: Care of device  [name] 06/08/2020, 3:38 PM

## 2020-06-08 NOTE — Anesthesia Procedure Notes (Signed)
Anesthesia Regional Block: Adductor canal block   Pre-Anesthetic Checklist: ,, timeout performed, Correct Patient, Correct Site, Correct Laterality, Correct Procedure, Correct Position, site marked, Risks and benefits discussed,  Surgical consent,  Pre-op evaluation,  At surgeon's request and post-op pain management  Laterality: Right  Prep: chloraprep       Needles:  Injection technique: Single-shot  Needle Type: Echogenic Needle     Needle Length: 10cm  Needle Gauge: 21     Additional Needles:   Narrative:  Start time: 06/08/2020 9:11 AM End time: 06/08/2020 9:15 AM Injection made incrementally with aspirations every 5 mL.  Performed by: Personally  Anesthesiologist: Audry Pili, MD  Additional Notes: No pain on injection. No increased resistance to injection. Injection made in 5cc increments. Good needle visualization. Patient tolerated the procedure well.

## 2020-06-08 NOTE — Progress Notes (Signed)
Assisted Dr. Brock with right, ultrasound guided, adductor canal block. Side rails up, monitors on throughout procedure. See vital signs in flow sheet. Tolerated Procedure well.  

## 2020-06-08 NOTE — Anesthesia Preprocedure Evaluation (Addendum)
Anesthesia Evaluation  Patient identified by MRN, date of birth, ID band Patient awake    Reviewed: Allergy & Precautions, NPO status , Patient's Chart, lab work & pertinent test results  History of Anesthesia Complications (+) PONV and history of anesthetic complications  Airway Mallampati: II  TM Distance: >3 FB Neck ROM: Full    Dental  (+) Dental Advisory Given, Teeth Intact   Pulmonary asthma , PE   Pulmonary exam normal        Cardiovascular hypertension, Pt. on medications + DVT  Normal cardiovascular exam     Neuro/Psych PSYCHIATRIC DISORDERS Anxiety Depression negative neurological ROS     GI/Hepatic Neg liver ROS, GERD  Medicated and Controlled,  Endo/Other  neg diabetes (denies)Hypothyroidism  Obesity   Renal/GU CRFRenal disease     Musculoskeletal  (+) Arthritis ,   Abdominal   Peds  Hematology  Plt 321k On xarelto, last dose <72 hr ago    Anesthesia Other Findings Covid test negative   Reproductive/Obstetrics                            Anesthesia Physical Anesthesia Plan  ASA: III  Anesthesia Plan: General   Post-op Pain Management:  Regional for Post-op pain   Induction: Intravenous  PONV Risk Score and Plan: 4 or greater and Treatment may vary due to age or medical condition, Ondansetron and Dexamethasone  Airway Management Planned: LMA  Additional Equipment: None  Intra-op Plan:   Post-operative Plan: Extubation in OR  Informed Consent: I have reviewed the patients History and Physical, chart, labs and discussed the procedure including the risks, benefits and alternatives for the proposed anesthesia with the patient or authorized representative who has indicated his/her understanding and acceptance.     Dental advisory given  Plan Discussed with: CRNA and Anesthesiologist  Anesthesia Plan Comments: (Xarelto taken less than 72 hours ago, will  proceed with GA as opposed to spinal)      Anesthesia Quick Evaluation

## 2020-06-08 NOTE — Evaluation (Signed)
Physical Therapy Evaluation Patient Details Name: [name] MRN: [id number] DOB: [birth year] Today's Date: 06/08/2020   History of Present Illness  74 yo female s/p R TKA 06/08/20  Clinical Impression  On eval POD 0, pt required Min assist for mobility.She walked ~60 feet with a RW. Min-Mod pain with activity. Mild lightheadedness but she was able to participate well. Plan is for d/c home tomorrow as long as she continues to do well.     Follow Up Recommendations Follow surgeon's recommendation for DC plan and follow-up therapies    Equipment Recommendations  None recommended by PT    Recommendations for Other Services       Precautions / Restrictions Precautions Precautions: Fall Restrictions Weight Bearing Restrictions: No Other Position/Activity Restrictions: WBAT      Mobility  Bed Mobility Overal bed mobility: Needs Assistance Bed Mobility: Supine to Sit;Sit to Supine     Supine to sit: Min guard;HOB elevated Sit to supine: Min guard;HOB elevated      Transfers Overall transfer level: Needs assistance Equipment used: Rolling walker (2 wheeled) Transfers: Sit to/from Stand Sit to Stand: Min assist         General transfer comment: Assist to steady. VCs safety, technique, hand placement.  Ambulation/Gait Ambulation/Gait assistance: Min assist Gait Distance (Feet): 60 Feet Assistive device: Rolling walker (2 wheeled) Gait Pattern/deviations: Step-to pattern;Step-through pattern;Decreased stride length     General Gait Details: VCs safety, technique, sequence. Assist to stabilize throughout distance.  Stairs            Wheelchair Mobility    Modified Rankin (Stroke Patients Only)       Balance Overall balance assessment: Mild deficits observed, not formally tested                                           Pertinent Vitals/Pain Pain Assessment: 0-10 Pain Score: 4  Pain Location: R knee Pain Descriptors /  Indicators: Discomfort;Sore Pain Intervention(s): Repositioned;Monitored during session;Ice applied    Home Living Family/patient expects to be discharged to:: Private residence Living Arrangements: Other relatives Available Help at Discharge: Family Type of Home: House Home Access: Stairs to enter   Technical brewer of Steps: 1 Home Layout: One level Home Equipment: Environmental consultant - 2 wheels;Bedside commode      Prior Function Level of Independence: Independent               Hand Dominance        Extremity/Trunk Assessment   Upper Extremity Assessment Upper Extremity Assessment: Overall WFL for tasks assessed    Lower Extremity Assessment Lower Extremity Assessment: Generalized weakness    Cervical / Trunk Assessment Cervical / Trunk Assessment: Normal  Communication   Communication: No difficulties  Cognition Arousal/Alertness: Awake/alert Behavior During Therapy: WFL for tasks assessed/performed Overall Cognitive Status: Within Functional Limits for tasks assessed                                        General Comments      Exercises     Assessment/Plan    PT Assessment Patient needs continued PT services  PT Problem List Decreased strength;Decreased range of motion;Decreased mobility;Decreased activity tolerance;Decreased balance;Decreased knowledge of use of DME;Pain;Decreased knowledge of precautions       PT Treatment Interventions DME  instruction;Gait training;Therapeutic activities;Therapeutic exercise;Patient/family education;Balance training;Stair training;Functional mobility training    PT Goals (Current goals can be found in the Care Plan section)  Acute Rehab PT Goals Patient Stated Goal: regain independence. home PT Goal Formulation: With patient/family Time For Goal Achievement: 06/22/20 Potential to Achieve Goals: Good    Frequency 7X/week   Barriers to discharge        Co-evaluation                AM-PAC PT "6 Clicks" Mobility  Outcome Measure Help needed turning from your back to your side while in a flat bed without using bedrails?: A Little Help needed moving from lying on your back to sitting on the side of a flat bed without using bedrails?: A Little Help needed moving to and from a bed to a chair (including a wheelchair)?: A Little Help needed standing up from a chair using your arms (e.g., wheelchair or bedside chair)?: A Little Help needed to walk in hospital room?: A Little Help needed climbing 3-5 steps with a railing? : A Little 6 Click Score: 18    End of Session Equipment Utilized During Treatment: Gait belt Activity Tolerance: Patient tolerated treatment well Patient left: in bed;with call bell/phone within reach;with bed alarm set;with family/visitor present   PT Visit Diagnosis: Other abnormalities of gait and mobility (R26.89);Pain Pain - Right/Left: Right Pain - part of body: Knee    Time: 1520-1546 PT Time Calculation (min) (ACUTE ONLY): 26 min   Charges:   PT Evaluation $PT Eval Low Complexity: 1 Low PT Treatments $Gait Training: 8-22 mins           Doreatha Massed, PT Acute Rehabilitation  Office: [phone] Pager: [phone]

## 2020-06-08 NOTE — H&P (Signed)
[name] MRN:  [id number] DOB/SEX:  16-Dec-1946/female  CHIEF COMPLAINT:  Painful right Knee  HISTORY: Patient is a 74 y.o. female presented with a history of pain in the right knee. Onset of symptoms was gradual starting a few years ago with gradually worsening course since that time. Patient has been treated conservatively with over-the-counter NSAIDs and activity modification. Patient currently rates pain in the knee at 10 out of 10 with activity. There is pain at night.  PAST MEDICAL HISTORY: Patient Active Problem List   Diagnosis Date Noted  . DOE (dyspnea on exertion) 12/03/2019  . CKD (chronic kidney disease) stage 3, GFR 30-59 ml/min 02/24/2018  . Acute pulmonary embolism ([place]) 02/22/2018  . Type 2 diabetes mellitus with stage 3 chronic kidney disease ([place]) 02/22/2018  . Elevated troponin 02/22/2018  . Cyst of right orbit 01/01/2016  . Left facial pain 11/03/2015  . Acute maxillary sinusitis 03/08/2015  . Hypothyroidism 01/23/2008  . Seasonal and perennial allergic rhinitis 01/23/2008  . Asthma, mild intermittent 01/23/2008  . Esophageal reflux 01/23/2008  . Essential hypertension 01/23/2008   Past Medical History:  Diagnosis Date  . Allergic rhinitis   . Allergic rhinitis   . Anxiety   . Arthritis    "hands, feet, shoulders, arms" (02/22/2018)  . Bilateral pulmonary embolism ([place]) 02/21/2018  . Chronic cough   . Depression   . Dupuytren contracture    "both feet, both hands" (02/22/2018)  . DVT (deep venous thrombosis) ([facility])    bil le  . GERD (gastroesophageal reflux disease)   . Glaucoma   . Hypertension   . Hypothyroidism   . Mild persistent asthma    cougher not a wheezr gets chokes easily  . Pneumonia   . PONV (postoperative nausea and vomiting)    Past Surgical History:  Procedure Laterality Date  . BLADDER SUSPENSION  X 2  . BUNIONECTOMY     RIGHT FOOT ARCH REPAIRED AS WELL  . CATARACT EXTRACTION W/ INTRAOCULAR LENS  IMPLANT, BILATERAL Bilateral    . DILATION AND CURETTAGE OF UTERUS  X 3  . DUPUYTREN CONTRACTURE RELEASE Bilateral    "feet"  . FASCIECTOMY Right 01/23/2018   Procedure: SEGMENTAL FASCIECTOMY RIGHT MIDDLE FINGER;  Surgeon: Daryll Brod, MD;  Location: [facility];  Service: Orthopedics;  Laterality: Right;  AXILLARY BLOCK  . KNEE ARTHROSCOPY Right   . REDUCTION MAMMAPLASTY Bilateral 2018  . TOTAL ABDOMINAL HYSTERECTOMY  1975   "got pregnant w/IUD in then lost baby"     MEDICATIONS:   No medications prior to admission.    ALLERGIES:   Allergies  Allergen Reactions  . Naproxen Itching and Swelling    REVIEW OF SYSTEMS:  A comprehensive review of systems was negative except for: Musculoskeletal: positive for arthralgias and bone pain   FAMILY HISTORY:   Family History  Problem Relation Age of Onset  . Pneumonia Father   . Arthritis Mother     SOCIAL HISTORY:   Social History   Tobacco Use  . Smoking status: Never Smoker  . Smokeless tobacco: Never Used  Substance Use Topics  . Alcohol use: Not Currently    Comment: 02/22/2018 "might have a drink q couple years"     EXAMINATION:  Vital signs in last 24 hours:    There were no vitals taken for this visit.  General Appearance:    Alert, cooperative, no distress, appears stated age  Head:    Normocephalic, without obvious abnormality, atraumatic  Eyes:  PERRL, conjunctiva/corneas clear, EOM's intact, fundi    benign, both eyes  Ears:    Normal TM's and external ear canals, both ears  Nose:   Nares normal, septum midline, mucosa normal, no drainage    or sinus tenderness  Throat:   Lips, mucosa, and tongue normal; teeth and gums normal  Neck:   Supple, symmetrical, trachea midline, no adenopathy;    thyroid:  no enlargement/tenderness/nodules; no carotid   bruit or JVD  Back:     Symmetric, no curvature, ROM normal, no CVA tenderness  Lungs:     Clear to auscultation bilaterally, respirations unlabored  Chest Wall:    No  tenderness or deformity   Heart:    Regular rate and rhythm, S1 and S2 normal, no murmur, rub   or gallop  Breast Exam:    No tenderness, masses, or nipple abnormality  Abdomen:     Soft, non-tender, bowel sounds active all four quadrants,    no masses, no organomegaly  Genitalia:    Normal female without lesion, discharge or tenderness  Rectal:    Normal tone], no masses or tenderness;   guaiac negative stool  Extremities:   Extremities normal, atraumatic, no cyanosis or edema  Pulses:   2+ and symmetric all extremities  Skin:   Skin color, texture, turgor normal, no rashes or lesions  Lymph nodes:   Cervical, supraclavicular, and axillary nodes normal  Neurologic:   CNII-XII intact, normal strength, sensation and reflexes    throughout    Musculoskeletal:  ROM 0-120, Ligaments intact,  Imaging Review Plain radiographs demonstrate severe degenerative joint disease of the right knee. The overall alignment is neutral. The bone quality appears to be good for age and reported activity level.  Assessment/Plan: Primary osteoarthritis, right knee   The patient history, physical examination and imaging studies are consistent with advanced degenerative joint disease of the right knee. The patient has failed conservative treatment.  The clearance notes were reviewed.  After discussion with the patient it was felt that Total Knee Replacement was indicated. The procedure,  risks, and benefits of total knee arthroplasty were presented and reviewed. The risks including but not limited to aseptic loosening, infection, blood clots, vascular injury, stiffness, patella tracking problems complications among others were discussed. The patient acknowledged the explanation, agreed to proceed with the plan.  Preoperative templating of the joint replacement has been completed, documented, and submitted to the Operating Room personnel in order to optimize intra-operative equipment management.    Patient's  anticipated LOS is less than 2 midnights, meeting these requirements: - Lives within 1 hour of care - Has a competent adult at home to recover with post-op recover - NO history of  - Chronic pain requiring opiods  - Diabetes  - Coronary Artery Disease  - Heart failure  - Heart attack  - Stroke  - DVT/VTE  - Cardiac arrhythmia  - Respiratory Failure/COPD  - Renal failure  - Anemia  - Advanced Liver disease       Donia Ast 06/08/2020, 6:53 AM

## 2020-06-08 NOTE — Anesthesia Procedure Notes (Signed)
Procedure Name: LMA Insertion Date/Time: 06/08/2020 9:34 AM Performed by: Sharlette Dense, CRNA Patient Re-evaluated:Patient Re-evaluated prior to induction Oxygen Delivery Method: Circle system utilized Preoxygenation: Pre-oxygenation with 100% oxygen Induction Type: IV induction Ventilation: Mask ventilation without difficulty LMA: LMA inserted LMA Size: 4.0 Number of attempts: 1 Placement Confirmation: positive ETCO2 and breath sounds checked- equal and bilateral Tube secured with: Tape Dental Injury: Teeth and Oropharynx as per pre-operative assessment

## 2020-06-08 NOTE — Op Note (Signed)
TOTAL KNEE REPLACEMENT OPERATIVE NOTE:  06/08/2020  12:58 PM  PATIENT:  [name]  74 y.o. female  PRE-OPERATIVE DIAGNOSIS:  Osteoarthritis right knee  POST-OPERATIVE DIAGNOSIS:  Osteoarthritis right knee  PROCEDURE:  Procedure(s): TOTAL KNEE ARTHROPLASTY  SURGEON:  Surgeon(s): Vickey Huger, MD  PHYSICIAN ASSISTANT: Carlyon Shadow, PA-C   ANESTHESIA:   general  SPECIMEN: None  COUNTS:  Correct  TOURNIQUET:   Total Tourniquet Time Documented: Thigh (Right) - 34 minutes Total: Thigh (Right) - 34 minutes   DICTATION:  Indication for procedure:    The patient is a 74 y.o. female who has failed conservative treatment for Osteoarthritis right knee.  Informed consent was obtained prior to anesthesia. The risks versus benefits of the operation were explain and in a way the patient can, and did, understand.    Description of procedure:     The patient was taken to the operating room and placed under anesthesia.  The patient was positioned in the usual fashion taking care that all body parts were adequately padded and/or protected.  A tourniquet was applied and the leg prepped and draped in the usual sterile fashion.  The extremity was exsanguinated with the esmarch and tourniquet inflated to 350 mmHg.  Pre-operative range of motion was normal.   A midline incision approximately 6-7 inches long was made with a #10 blade.  A new blade was used to make a parapatellar arthrotomy going 2-3 cm into the quadriceps tendon, over the patella, and alongside the medial aspect of the patellar tendon.  A synovectomy was then performed with the #10 blade and forceps. I then elevated the deep MCL off the medial tibial metaphysis subperiosteally around to the semimembranosus attachment.    I everted the patella and used calipers to measure patellar thickness.  I used the reamer to ream down to appropriate thickness to recreate the native thickness.  I then removed excess bone with the  rongeur and sagittal saw.  I used the appropriately sized template and drilled the three lug holes.  I then put the trial in place and measured the thickness with the calipers to ensure recreation of the native thickness.  The trial was then removed and the patella subluxed and the knee brought into flexion.  A homan retractor was place to retract and protect the patella and lateral structures.  A Z-retractor was place medially to protect the medial structures.  The extra-medullary alignment system was used to make cut the tibial articular surface perpendicular to the anamotic axis of the tibia and in 3 degrees of posterior slope.  The cut surface and alignment jig was removed.  I then used the intramedullary alignment guide to make a  valgus cut on the distal femur.  I then marked out the epicondylar axis on the distal femur.I then used the anterior referencing sizer and measured the femur to be a size 6.  The 4-In-1 cutting block was screwed into place in external rotation matching the posterior condylar angle, making our cuts perpendicular to the epicondylar axis.  Anterior, posterior and chamfer cuts were made with the sagittal saw.  The cutting block and cut pieces were removed.  A lamina spreader was placed in 90 degrees of flexion.  The ACL, PCL, menisci, and posterior condylar osteophytes were removed.  A 13 mm spacer blocked was found to offer good flexion and extension gap balance after minimal in degree releasing.   The scoop retractor was then placed and the femoral finishing block was pinned in  place.  The small sagittal saw was used as well as the lug drill to finish the femur.  The block and cut surfaces were removed and the medullary canal hole filled with autograft bone from the cut pieces.  The tibia was delivered forward in deep flexion and external rotation.  A size C tray was selected and pinned into place centered on the medial 1/3 of the tibial tubercle.  The reamer and keel was used  to prepare the tibia through the tray.    I then trialed with the size 6 femur, size C tibia, a 13 mm insert and the 32 patella.  I had excellent flexion/extension gap balance, excellent patella tracking.  Flexion was full and beyond 120 degrees; extension was zero.  These components were chosen and the staff opened them to me on the back table while the knee was lavaged copiously and the cement mixed.  The soft tissue was infiltrated with 60cc of exparel 1.3% through a 21 gauge needle.  I cemented in the components and removed all excess cement.  The polyethylene tibial component was snapped into place and the knee placed in extension while cement was hardening.  The capsule was infilltrated with a 60cc exparel/marcaine/saline mixture.   Once the cement was hard, the tourniquet was let down.  Hemostasis was obtained.  The arthrotomy was closed using a #1 stratofix running suture.  The deep soft tissues were closed with #0 vicryls and the subcuticular layer closed with #2-0 vicryl.  The skin was reapproximated and closed with 3.0 Monocryl.  The wound was covered with steristrips, aquacel dressing, and a TED stocking.   The patient was then awakened, extubated, and taken to the recovery room in stable condition.  BLOOD LOSS:  301SW COMPLICATIONS:  None.  PLAN OF CARE: Admit for overnight observation  PATIENT DISPOSITION:  PACU - hemodynamically stable.   Please fax a copy of this op note to my office at [phone] (please only include page 1 and 2 of the Case Information op note)

## 2020-06-08 NOTE — Anesthesia Postprocedure Evaluation (Signed)
Anesthesia Post Note  Patient: [name]  Procedure(s) Performed: TOTAL KNEE ARTHROPLASTY (Right Knee)     Patient location during evaluation: PACU Anesthesia Type: General Level of consciousness: awake and alert Pain management: pain level controlled Vital Signs Assessment: post-procedure vital signs reviewed and stable Respiratory status: spontaneous breathing, nonlabored ventilation and respiratory function stable Cardiovascular status: blood pressure returned to baseline and stable Postop Assessment: no apparent nausea or vomiting Anesthetic complications: no   No complications documented.  Last Vitals:  Vitals:   06/08/20 1130 06/08/20 1145  BP: (!) 144/65 (!) 143/69  Pulse: 82 82  Resp: 11 15  Temp:    SpO2: 100% 99%    Last Pain:  Vitals:   06/08/20 1155  TempSrc:   PainSc: [place]

## 2020-06-08 NOTE — Progress Notes (Signed)
Orthopedic Tech Progress Note Patient Details:  [name] 10-Dec-1946 [id number]  CPM Right Knee CPM Right Knee: On Right Knee Flexion (Degrees): 90 Right Knee Extension (Degrees): 0 Additional Comments: Trapeze bar  Post Interventions Patient Tolerated: Well Instructions Provided: Care of device  [name] 06/08/2020, 11:24 AM

## 2020-06-09 ENCOUNTER — Encounter (HOSPITAL_COMMUNITY): Payer: Self-pay | Admitting: Orthopedic Surgery

## 2020-06-09 DIAGNOSIS — M1711 Unilateral primary osteoarthritis, right knee: Secondary | ICD-10-CM | POA: Diagnosis not present

## 2020-06-09 LAB — BASIC METABOLIC PANEL
Anion gap: 8 (ref 5–15)
BUN: 13 mg/dL (ref 8–23)
CO2: 24 mmol/L (ref 22–32)
Calcium: 8.4 mg/dL — ABNORMAL LOW (ref 8.9–10.3)
Chloride: 106 mmol/L (ref 98–111)
Creatinine, Ser: 0.96 mg/dL (ref 0.44–1.00)
GFR calc Af Amer: >60 mL/min (ref >60)
GFR calc non Af Amer: 59 mL/min — ABNORMAL LOW (ref >60)
Glucose, Bld: 149 mg/dL — ABNORMAL HIGH (ref 70–99)
Potassium: 3.9 mmol/L (ref 3.5–5.1)
Sodium: 138 mmol/L (ref 135–145)

## 2020-06-09 LAB — CBC
HCT: 27.3 % — ABNORMAL LOW (ref 36.0–46.0)
Hemoglobin: 8.6 g/dL — ABNORMAL LOW (ref 12.0–15.0)
MCH: 26.7 pg (ref 26.0–34.0)
MCHC: 31.5 g/dL (ref 30.0–36.0)
MCV: 84.8 fL (ref 80.0–100.0)
Platelets: 251 10*3/uL (ref 150–400)
RBC: 3.22 MIL/uL — ABNORMAL LOW (ref 3.87–5.11)
RDW: 14.8 % (ref 11.5–15.5)
WBC: 12.1 10*3/uL — ABNORMAL HIGH (ref 4.0–10.5)
nRBC: 0 % (ref 0.0–0.2)

## 2020-06-09 MED ORDER — TIZANIDINE HCL 4 MG PO TABS: 4.0000 mg | ORAL_TABLET | Freq: Four times a day (QID) | ORAL | 0 refills | Status: DC | PRN

## 2020-06-09 MED ORDER — OXYCODONE HCL 5 MG PO TABS: 5.0000 mg | ORAL_TABLET | Freq: Four times a day (QID) | ORAL | 0 refills | Status: DC | PRN

## 2020-06-09 NOTE — Progress Notes (Signed)
Physical Therapy Treatment Patient Details Name: [name] MRN: [id number] DOB: 1946-01-24 Today's Date: 06/09/2020    History of Present Illness 74 yo female s/p R TKA 06/08/20    PT Comments    Progressing with mobility. Will plan to have a 2nd session prior to d/c later today.    Follow Up Recommendations  Follow surgeon's recommendation for DC plan and follow-up therapies     Equipment Recommendations  None recommended by PT    Recommendations for Other Services       Precautions / Restrictions Precautions Precautions: Fall Restrictions Weight Bearing Restrictions: No Other Position/Activity Restrictions: WBAT    Mobility  Bed Mobility Overal bed mobility: Needs Assistance Bed Mobility: Supine to Sit     Supine to sit: Supervision;HOB elevated        Transfers Overall transfer level: Needs assistance Equipment used: Rolling walker (2 wheeled) Transfers: Sit to/from Stand Sit to Stand: Min guard         General transfer comment: VCs safety, technique, hand placement. Min guard for safety  Ambulation/Gait Ambulation/Gait assistance: Min guard Gait Distance (Feet): 75 Feet Assistive device: Rolling walker (2 wheeled) Gait Pattern/deviations: Step-to pattern;Step-through pattern;Decreased stride length     General Gait Details: VCs safety, technique, sequence. Min guard for Barrister's clerk    Modified Rankin (Stroke Patients Only)       Balance Overall balance assessment: Mild deficits observed, not formally tested                                          Cognition Arousal/Alertness: Awake/alert Behavior During Therapy: WFL for tasks assessed/performed Overall Cognitive Status: Within Functional Limits for tasks assessed                                        Exercises Total Joint Exercises Ankle Circles/Pumps: AROM;Both;10 reps Quad Sets: AROM;Both;10  reps Heel Slides: AAROM;Right;10 reps Hip ABduction/ADduction: AROM;Right;10 reps Straight Leg Raises: AROM;Right;10 reps Goniometric ROM: ~5-65    General Comments        Pertinent Vitals/Pain Pain Assessment: 0-10 Pain Score: 6  Pain Location: R knee Pain Descriptors / Indicators: Discomfort;Sore Pain Intervention(s): Limited activity within patient's tolerance;Monitored during session;Repositioned;Ice applied    Home Living                      Prior Function            PT Goals (current goals can now be found in the care plan section) Progress towards PT goals: Progressing toward goals    Frequency    7X/week      PT Plan Current plan remains appropriate    Co-evaluation              AM-PAC PT "6 Clicks" Mobility   Outcome Measure  Help needed turning from your back to your side while in a flat bed without using bedrails?: A Little Help needed moving from lying on your back to sitting on the side of a flat bed without using bedrails?: A Little Help needed moving to and from a bed to a chair (including a wheelchair)?: A Little Help needed standing up from a chair using  your arms (e.g., wheelchair or bedside chair)?: A Little Help needed to walk in hospital room?: A Little Help needed climbing 3-5 steps with a railing? : A Little 6 Click Score: 18    End of Session Equipment Utilized During Treatment: Gait belt Activity Tolerance: Patient tolerated treatment well Patient left: in chair;with call bell/phone within reach;with family/visitor present   PT Visit Diagnosis: Other abnormalities of gait and mobility (R26.89);Pain Pain - Right/Left: Right Pain - part of body: Knee     Time: 5726-2035 PT Time Calculation (min) (ACUTE ONLY): 24 min  Charges:  $Gait Training: 8-22 mins $Therapeutic Exercise: 8-22 mins                         Doreatha Massed, PT Acute Rehabilitation  Office: [phone] Pager: [phone]

## 2020-06-09 NOTE — Progress Notes (Signed)
Physical Therapy Treatment Patient Details Name: [name] MRN: [id number] DOB: 05-Sep-1946 Today's Date: 06/09/2020    History of Present Illness 74 yo female s/p R TKA 06/08/20    PT Comments    Pt continues to progress well. Reviewed/practiced gait and stair training. Issued HEP for pt to perform 2x/day until she begins OP or HH. All education completed. Okay to d/c from PT standpoint.    Follow Up Recommendations  Follow surgeon's recommendation for DC plan and follow-up therapies     Equipment Recommendations  None recommended by PT    Recommendations for Other Services       Precautions / Restrictions Precautions Precautions: Fall Restrictions Weight Bearing Restrictions: No Other Position/Activity Restrictions: WBAT    Mobility  Bed Mobility Overal bed mobility: Needs Assistance Bed Mobility: Supine to Sit;Sit to Supine     Supine to sit: Supervision;HOB elevated Sit to supine: Supervision;HOB elevated      Transfers Overall transfer level: Needs assistance Equipment used: Rolling walker (2 wheeled) Transfers: Sit to/from Stand Sit to Stand: Min guard         General transfer comment: VCs safety, technique, hand placement. Min guard for safety  Ambulation/Gait Ambulation/Gait assistance: Min guard Gait Distance (Feet): 60 Feet Assistive device: Rolling walker (2 wheeled) Gait Pattern/deviations: Step-to pattern;Step-through pattern;Decreased stride length     General Gait Details: VCs safety, technique, sequence. Min guard for safety   Stairs Stairs: Yes Stairs assistance: Min guard Stair Management: Step to pattern;Forwards;With walker Number of Stairs: 1 General stair comments: VCs safety, technique, sequence. Min guard assist for safety. Daughter present to observe.   Wheelchair Mobility    Modified Rankin (Stroke Patients Only)       Balance Overall balance assessment: Mild deficits observed, not formally tested                                           Cognition Arousal/Alertness: Awake/alert Behavior During Therapy: WFL for tasks assessed/performed Overall Cognitive Status: Within Functional Limits for tasks assessed                                        Exercises Total Joint Exercises Ankle Circles/Pumps: AROM;Both;10 reps Quad Sets: AROM;Both;10 reps Heel Slides: AAROM;Right;10 reps Hip ABduction/ADduction: AROM;Right;10 reps Straight Leg Raises: AROM;Right;10 reps Goniometric ROM: ~5-65    General Comments        Pertinent Vitals/Pain Pain Assessment: 0-10 Pain Score: 6  Pain Location: R knee Pain Descriptors / Indicators: Discomfort;Sore Pain Intervention(s): Monitored during session;Repositioned    Home Living                      Prior Function            PT Goals (current goals can now be found in the care plan section) Progress towards PT goals: Progressing toward goals    Frequency    7X/week      PT Plan Current plan remains appropriate    Co-evaluation              AM-PAC PT "6 Clicks" Mobility   Outcome Measure  Help needed turning from your back to your side while in a flat bed without using bedrails?: A Little Help needed moving from lying on your  back to sitting on the side of a flat bed without using bedrails?: A Little Help needed moving to and from a bed to a chair (including a wheelchair)?: A Little Help needed standing up from a chair using your arms (e.g., wheelchair or bedside chair)?: A Little Help needed to walk in hospital room?: A Little Help needed climbing 3-5 steps with a railing? : A Little 6 Click Score: 18    End of Session Equipment Utilized During Treatment: Gait belt Activity Tolerance: Patient tolerated treatment well Patient left: in bed;with call bell/phone within reach;with family/visitor present   PT Visit Diagnosis: Other abnormalities of gait and mobility (R26.89);Pain Pain -  Right/Left: Right Pain - part of body: Knee     Time: 0867-6195 PT Time Calculation (min) (ACUTE ONLY): 11 min  Charges:  $Gait Training: 8-22 mins $Therapeutic Exercise: 8-22 mins                        Doreatha Massed, PT Acute Rehabilitation  Office: [phone] Pager: [phone]

## 2020-06-09 NOTE — Discharge Summary (Signed)
SPORTS MEDICINE & JOINT REPLACEMENT   Lara Mulch, MD   Carlyon Shadow, PA-C [place], [place], [place]  [address]                             [phone]  PATIENT ID: [name]        MRN:  [id number]          DOB/AGE: [birth year] / 74 y.o.    DISCHARGE SUMMARY  ADMISSION DATE:    06/08/2020 DISCHARGE DATE:   06/09/2020   ADMISSION DIAGNOSIS: S/P total knee replacement [Z96.659]    DISCHARGE DIAGNOSIS:  Osteoarthritis right knee    ADDITIONAL DIAGNOSIS: Active Problems:   S/P total knee replacement  Past Medical History:  Diagnosis Date  . Allergic rhinitis   . Allergic rhinitis   . Anxiety   . Arthritis    "hands, feet, shoulders, arms" (02/22/2018)  . Bilateral pulmonary embolism ([facility]) 02/21/2018  . Chronic cough   . Depression   . Dupuytren contracture    "both feet, both hands" (02/22/2018)  . DVT (deep venous thrombosis) ([facility])    bil le  . GERD (gastroesophageal reflux disease)   . Glaucoma   . Hypertension   . Hypothyroidism   . Mild persistent asthma    cougher not a wheezr gets chokes easily  . Pneumonia   . PONV (postoperative nausea and vomiting)     PROCEDURE: Procedure(s): TOTAL KNEE ARTHROPLASTY on 06/08/2020  CONSULTS:    HISTORY:  See H&P in chart  HOSPITAL COURSE:  [name] is a 74 y.o. admitted on 06/08/2020 and found to have a diagnosis of Osteoarthritis right knee.  After appropriate laboratory studies were obtained  they were taken to the operating room on 06/08/2020 and underwent Procedure(s): TOTAL KNEE ARTHROPLASTY.   They were given perioperative antibiotics:  Anti-infectives (From admission, onward)   Start     Dose/Rate Route Frequency Ordered Stop   06/08/20 1600  ceFAZolin (ANCEF) IVPB 2g/100 mL premix        2 g 200 mL/hr over 30 Minutes Intravenous Every 6 hours 06/08/20 1236 06/08/20 2139   06/08/20 0730  ceFAZolin (ANCEF) IVPB 2g/100 mL premix        2 g 200 mL/hr over 30 Minutes Intravenous On call  to O.R. 06/08/20 4536 06/08/20 0947    .  Patient given tranexamic acid IV or topical and exparel intra-operatively.  Tolerated the procedure well.    POD# 1: Vital signs were stable.  Patient denied Chest pain, shortness of breath, or calf pain.  Patient was started on Aspirin twice daily at 8am.  Consults to PT, OT, and care management were made.  The patient was weight bearing as tolerated.  CPM was placed on the operative leg 0-90 degrees for 6-8 hours a day. When out of the CPM, patient was placed in the foam block to achieve full extension. Incentive spirometry was taught.  Dressing was changed.       POD #2, Continued  PT for ambulation and exercise program.  IV saline locked.  O2 discontinued.    The remainder of the hospital course was dedicated to ambulation and strengthening.   The patient was discharged on 1 Day Post-Op in  Good condition.  Blood products given:none  DIAGNOSTIC STUDIES: Recent vital signs:  Patient Vitals for the past 24 hrs:  BP Temp Temp src Pulse Resp SpO2 Height Weight  06/09/20 0514 Marland Kitchen)  162/62 98 F (36.7 C) Oral 74 18 98 % -- --  06/09/20 0139 (!) 152/66 98.3 F (36.8 C) Oral 76 18 99 % -- --  06/08/20 2133 (!) 151/65 98.5 F (36.9 C) Oral 90 18 99 % -- --  06/08/20 2021 -- -- -- -- -- 97 % -- --  06/08/20 1746 (!) 160/75 98.4 F (36.9 C) -- 83 16 97 % -- --  06/08/20 1555 136/65 -- -- 81 18 97 % -- --  06/08/20 1533 116/73 (!) 97.5 F (36.4 C) -- 85 14 94 % -- --  06/08/20 1426 (!) 132/56 -- -- 92 17 93 % -- --  06/08/20 1319 (!) 152/70 97.6 F (36.4 C) -- 84 17 99 % -- --  06/08/20 1225 140/72 97.8 F (36.6 C) Oral 80 16 100 % -- --  06/08/20 1200 -- -- -- 82 16 100 % -- --  06/08/20 1145 (!) 143/69 -- -- 82 15 99 % -- --  06/08/20 1130 (!) 144/65 -- -- 82 11 100 % -- --  06/08/20 1115 (!) 151/75 -- -- 84 13 100 % -- --  06/08/20 1103 (!) 143/75 98.3 F (36.8 C) -- 99 (!) 22 100 % -- --  06/08/20 0918 (!) 149/61 -- -- 83 12 100 % --  --  06/08/20 0913 136/62 -- -- 70 11 99 % -- --  06/08/20 0749 (!) 145/74 98.1 F (36.7 C) Oral 77 18 98 % 5\' 2"  (1.575 m) 81.8 kg       Recent laboratory studies: Recent Labs    06/08/20 0800 06/09/20 0256  WBC 9.8 12.1*  HGB 12.2 8.6*  HCT 39.0 27.3*  PLT 305 251   Recent Labs    06/08/20 0800 06/09/20 0256  NA 140 138  K 4.5 3.9  CL 106 106  CO2 22 24  BUN 13 13  CREATININE 1.07* 0.96  GLUCOSE 100* 149*  CALCIUM 9.1 8.4*   Lab Results  Component Value Date   INR 0.99 02/24/2018   INR 1.14 02/23/2018     Recent Radiographic Studies :  DG Chest 2 View  Result Date: 06/02/2020 CLINICAL DATA:  74 year old female with history of asthma. EXAM: CHEST - 2 VIEW COMPARISON:  Chest x-ray 05/06/2019. FINDINGS: Lung volumes are normal. No consolidative airspace disease. No pleural effusions. No pneumothorax. No pulmonary nodule or mass noted. Pulmonary vasculature and the cardiomediastinal silhouette are within normal limits. IMPRESSION: 1.  No radiographic evidence of acute cardiopulmonary disease. Electronically Signed   By: Vinnie Langton M.D.   On: 06/02/2020 15:30    DISCHARGE INSTRUCTIONS:   DISCHARGE MEDICATIONS:     FOLLOW UP VISIT:    DISPOSITION: HOME VS. SNF  CONDITION:  Good   Donia Ast 06/09/2020, 7:25 AM

## 2020-06-09 NOTE — Progress Notes (Signed)
SPORTS MEDICINE AND JOINT REPLACEMENT  Lara Mulch, MD    Carlyon Shadow, PA-C [place], [place], [place]  [address]                             [phone]   PROGRESS NOTE  Subjective:  negative for Chest Pain  negative for Shortness of Breath  negative for Nausea/Vomiting   negative for Calf Pain  negative for Bowel Movement   Tolerating Diet: yes         Patient reports pain as 3 on 0-10 scale.    Objective: Vital signs in last 24 hours:    Patient Vitals for the past 24 hrs:  BP Temp Temp src Pulse Resp SpO2 Height Weight  06/09/20 0514 (!) 162/62 98 F (36.7 C) Oral 74 18 98 % -- --  06/09/20 0139 (!) 152/66 98.3 F (36.8 C) Oral 76 18 99 % -- --  06/08/20 2133 (!) 151/65 98.5 F (36.9 C) Oral 90 18 99 % -- --  06/08/20 2021 -- -- -- -- -- 97 % -- --  06/08/20 1746 (!) 160/75 98.4 F (36.9 C) -- 83 16 97 % -- --  06/08/20 1555 136/65 -- -- 81 18 97 % -- --  06/08/20 1533 116/73 (!) 97.5 F (36.4 C) -- 85 14 94 % -- --  06/08/20 1426 (!) 132/56 -- -- 92 17 93 % -- --  06/08/20 1319 (!) 152/70 97.6 F (36.4 C) -- 84 17 99 % -- --  06/08/20 1225 140/72 97.8 F (36.6 C) Oral 80 16 100 % -- --  06/08/20 1200 -- -- -- 82 16 100 % -- --  06/08/20 1145 (!) 143/69 -- -- 82 15 99 % -- --  06/08/20 1130 (!) 144/65 -- -- 82 11 100 % -- --  06/08/20 1115 (!) 151/75 -- -- 84 13 100 % -- --  06/08/20 1103 (!) 143/75 98.3 F (36.8 C) -- 99 (!) 22 100 % -- --  06/08/20 0918 (!) 149/61 -- -- 83 12 100 % -- --  06/08/20 0913 136/62 -- -- 70 11 99 % -- --  06/08/20 0749 (!) 145/74 98.1 F (36.7 C) Oral 77 18 98 % 5\' 2"  (1.575 m) 81.8 kg    @flow {1959:LAST@   Intake/Output from previous day:   06/21 0701 - 06/22 0700 In: 2801.3 [P.O.:720; I.V.:1881.3] Out: 1607 [Urine:1500]   Intake/Output this shift:   No intake/output data recorded.   Intake/Output      06/21 0701 - 06/22 0700 06/22 0701 - 06/23 0700   P.O. 720    I.V. (mL/kg) 1881.3 (23)    IV  Piggyback 200    Total Intake(mL/kg) 2801.3 (34.2)    Urine (mL/kg/hr) 1500    Stool 0    Blood 75    Total Output 1575    Net +1226.3         Urine Occurrence 1 x    Stool Occurrence 0 x       LABORATORY DATA: Recent Labs    06/08/20 0800 06/09/20 0256  WBC 9.8 12.1*  HGB 12.2 8.6*  HCT 39.0 27.3*  PLT 305 251   Recent Labs    06/08/20 0800 06/09/20 0256  NA 140 138  K 4.5 3.9  CL 106 106  CO2 22 24  BUN 13 13  CREATININE 1.07* 0.96  GLUCOSE 100* 149*  CALCIUM 9.1 8.4*   Lab  Results  Component Value Date   INR 0.99 02/24/2018   INR 1.14 02/23/2018    Examination:  General appearance: alert and cooperative Extremities: extremities normal, atraumatic, no cyanosis or edema  Wound Exam: clean, dry, intact   Drainage:  None: wound tissue dry  Motor Exam: Quadriceps and Hamstrings Intact  Sensory Exam: Superficial Peroneal, Deep Peroneal and Tibial normal   Assessment:    1 Day Post-Op  Procedure(s) (LRB): TOTAL KNEE ARTHROPLASTY (Right)  ADDITIONAL DIAGNOSIS:  Active Problems:   S/P total knee replacement     Plan: Physical Therapy as ordered Weight Bearing as Tolerated (WBAT)  DVT Prophylaxis:  Xarelto  DISCHARGE PLAN: Home  Patient doing well, plan to D/C home today       Patient's anticipated LOS is less than 2 midnights, meeting these requirements: - Lives within 1 hour of care - Has a competent adult at home to recover with post-op recover - NO history of  - Chronic pain requiring opiods  - Diabetes  - Coronary Artery Disease  - Heart failure  - Heart attack  - Stroke  - DVT/VTE  - Cardiac arrhythmia  - Respiratory Failure/COPD  - Renal failure  - Anemia  - Advanced Liver disease        Donia Ast 06/09/2020, 7:24 AM

## 2020-06-09 NOTE — TOC Transition Note (Signed)
Transition of Care [facility]) - CM/SW Discharge Note   Patient Details  Name: [name] MRN: [id number] Date of Birth: 05-30-1946  [facility] of [facility]) CM/SW Contact:  Lia Hopping, [place] Phone Number: 06/09/2020, 11:09 AM   Clinical Narrative:    Therapy Plan: OPPT Has  DME   Final next level of care: OP Rehab Barriers to Discharge: No Barriers Identified   Patient Goals and CMS Choice     Choice offered to / list presented to : NA  Discharge Placement                       Discharge Plan and Services                DME Arranged: N/A DME Agency: NA       HH Arranged: NA HH Agency: NA        Social Determinants of Health (SDOH) Interventions     Readmission Risk Interventions No flowsheet data found.

## 2020-06-10 NOTE — Addendum Note (Signed)
Addendum  created 06/10/20 1335 by Sharlette Dense, CRNA   Intraprocedure Event edited

## 2020-06-12 ENCOUNTER — Emergency Department (HOSPITAL_BASED_OUTPATIENT_CLINIC_OR_DEPARTMENT_OTHER)
Admission: EM | Admit: 2020-06-12 | Discharge: 2020-06-12 | Disposition: A | Discharge status: Home / Self Care | Payer: Medicare Other | Attending: Emergency Medicine | Admitting: Emergency Medicine

## 2020-06-12 ENCOUNTER — Emergency Department (HOSPITAL_BASED_OUTPATIENT_CLINIC_OR_DEPARTMENT_OTHER): Payer: Medicare Other

## 2020-06-12 ENCOUNTER — Encounter (HOSPITAL_BASED_OUTPATIENT_CLINIC_OR_DEPARTMENT_OTHER): Payer: Self-pay | Admitting: *Deleted

## 2020-06-12 ENCOUNTER — Other Ambulatory Visit: Payer: Self-pay

## 2020-06-12 DIAGNOSIS — Z7984 Long term (current) use of oral hypoglycemic drugs: Secondary | ICD-10-CM | POA: Diagnosis not present

## 2020-06-12 DIAGNOSIS — E1122 Type 2 diabetes mellitus with diabetic chronic kidney disease: Secondary | ICD-10-CM | POA: Insufficient documentation

## 2020-06-12 DIAGNOSIS — R0602 Shortness of breath: Secondary | ICD-10-CM | POA: Insufficient documentation

## 2020-06-12 DIAGNOSIS — Z7901 Long term (current) use of anticoagulants: Secondary | ICD-10-CM | POA: Insufficient documentation

## 2020-06-12 DIAGNOSIS — Z86711 Personal history of pulmonary embolism: Secondary | ICD-10-CM | POA: Insufficient documentation

## 2020-06-12 DIAGNOSIS — M940 Chondrocostal junction syndrome [Tietze]: Secondary | ICD-10-CM | POA: Diagnosis not present

## 2020-06-12 DIAGNOSIS — I129 Hypertensive chronic kidney disease with stage 1 through stage 4 chronic kidney disease, or unspecified chronic kidney disease: Secondary | ICD-10-CM | POA: Diagnosis not present

## 2020-06-12 DIAGNOSIS — N183 Chronic kidney disease, stage 3 unspecified: Secondary | ICD-10-CM | POA: Diagnosis not present

## 2020-06-12 LAB — CBC
HCT: 30.9 % — ABNORMAL LOW (ref 36.0–46.0)
Hemoglobin: 9.8 g/dL — ABNORMAL LOW (ref 12.0–15.0)
MCH: 27 pg (ref 26.0–34.0)
MCHC: 31.7 g/dL (ref 30.0–36.0)
MCV: 85.1 fL (ref 80.0–100.0)
Platelets: 312 10*3/uL (ref 150–400)
RBC: 3.63 MIL/uL — ABNORMAL LOW (ref 3.87–5.11)
RDW: 15.4 % (ref 11.5–15.5)
WBC: 10.4 10*3/uL (ref 4.0–10.5)
nRBC: 0 % (ref 0.0–0.2)

## 2020-06-12 LAB — BASIC METABOLIC PANEL
Anion gap: 11 (ref 5–15)
BUN: 13 mg/dL (ref 8–23)
CO2: 23 mmol/L (ref 22–32)
Calcium: 8.7 mg/dL — ABNORMAL LOW (ref 8.9–10.3)
Chloride: 102 mmol/L (ref 98–111)
Creatinine, Ser: 0.85 mg/dL (ref 0.44–1.00)
GFR calc Af Amer: >60 mL/min (ref >60)
GFR calc non Af Amer: >60 mL/min (ref >60)
Glucose, Bld: 116 mg/dL — ABNORMAL HIGH (ref 70–99)
Potassium: 3.5 mmol/L (ref 3.5–5.1)
Sodium: 136 mmol/L (ref 135–145)

## 2020-06-12 LAB — TROPONIN I (HIGH SENSITIVITY): Troponin I (High Sensitivity): 4 ng/L (ref <18)

## 2020-06-12 MED ORDER — IOHEXOL 350 MG/ML SOLN
100.0000 mL | Freq: Once | INTRAVENOUS | Status: AC | PRN
  Administered 2020-06-12: 70 mL via INTRAVENOUS

## 2020-06-12 MED ORDER — ACETAMINOPHEN 500 MG PO TABS
1000.0000 mg | ORAL_TABLET | Freq: Once | ORAL | Status: AC
  Administered 2020-06-12: 1000 mg via ORAL
  Filled 2020-06-12: qty 2

## 2020-06-12 MED ORDER — IPRATROPIUM BROMIDE HFA 17 MCG/ACT IN AERS
2.0000 | INHALATION_SPRAY | Freq: Once | RESPIRATORY_TRACT | Status: AC
  Administered 2020-06-12: 2 via RESPIRATORY_TRACT
  Filled 2020-06-12: qty 12.9

## 2020-06-12 MED ORDER — IPRATROPIUM-ALBUTEROL 0.5-2.5 (3) MG/3ML IN SOLN: 3.0000 mL | Freq: Once | RESPIRATORY_TRACT | Status: DC

## 2020-06-12 MED ORDER — BENZONATATE 100 MG PO CAPS
200.0000 mg | ORAL_CAPSULE | Freq: Two times a day (BID) | ORAL | 0 refills | Status: DC | PRN
Start: 2020-06-12 — End: 2020-07-22

## 2020-06-12 MED ORDER — ALBUTEROL SULFATE HFA 108 (90 BASE) MCG/ACT IN AERS
4.0000 | INHALATION_SPRAY | Freq: Once | RESPIRATORY_TRACT | Status: AC
  Administered 2020-06-12: 4 via RESPIRATORY_TRACT
  Filled 2020-06-12: qty 6.7

## 2020-06-12 NOTE — Discharge Instructions (Signed)
It was a wonderful to meet you both today.  She has no evidence of PE on her CT scan.  We recommend using Voltaren gel about 4 times daily on her chest where it is tender and can alternate this with ice/heat for 20 minutes at a time several times a day.  Please continue to use your incentive spirometer and take deep breaths throughout the day.  You can also use tea with honey or cough drops to help with your sore throat and cough.  I have sent in a cough medicine called Tessalon Perles that you can also use.  For the next 24 hours, please use your albuterol inhaler every 4-6 hours scheduled and then can ease off from there if your cough and breathing continue to improve.  Please follow-up if your shortness of breath is not improving despite management as above or sooner if worsening.

## 2020-06-12 NOTE — ED Notes (Signed)
XR at bedside

## 2020-06-12 NOTE — ED Provider Notes (Signed)
[facility] EMERGENCY DEPARTMENT Provider Note   CSN: [id number] Arrival date & time: 06/12/20  1219     History Chief Complaint  Patient presents with   Shortness of Breath    [name] is a 74 y.o. female with a history of type 2 diabetes, asthma, CKD stage III, hypertension, and previous bilateral pulmonary embolism in 2019 presenting for evaluation of acute shortness of breath and pleuritic chest pain.  She recently had a right total knee replacement on 06/08/2020 and discharged the following day.  Had to stop her Xarelto for several days prior to and restarted it on Wednesday, 6/23.  Daughter is at bedside and helps provide history.  Reports she had relatively sudden onset of shortness of breath yesterday afternoon.  Associated with bilateral lower chest pain that is worse when she coughs, takes in a deep breath, and with palpation.  Has been coughing more than usual since yesterday, nonproductive, does have chronic cough.  She tried an albuterol treatment this morning with less relief than usual in breathing, prompting evaluation in the ED.  Reports lower leg pain on right since yesterday, however this is the side of recent repair.  Has been getting up and walking around since surgery.  No fever.  Used oxycodone for the first time yesterday prior to incident and then also used this morning.  She also reports sore throat after intubation and frontal headache.  States she gets headaches like this when her "sinuses are acting up" and hits her quite suddenly.  She has not had her Covid vaccinations, recently tested Covid negative about 8 days ago prior to surgery.    Past Medical History:  Diagnosis Date   Allergic rhinitis    Allergic rhinitis    Anxiety    Arthritis    "hands, feet, shoulders, arms" (02/22/2018)   Bilateral pulmonary embolism (HCC) 02/21/2018   Chronic cough    Depression    Dupuytren contracture    "both feet, both hands" (02/22/2018)    DVT (deep venous thrombosis) (HCC)    bil le   GERD (gastroesophageal reflux disease)    Glaucoma    Hypertension    Hypothyroidism    Mild persistent asthma    cougher not a wheezr gets chokes easily   Pneumonia    PONV (postoperative nausea and vomiting)     Patient Active Problem List   Diagnosis Date Noted   S/P total knee replacement 06/08/2020   DOE (dyspnea on exertion) 12/03/2019   CKD (chronic kidney disease) stage 3, GFR 30-59 ml/min 02/24/2018   Acute pulmonary embolism ([place]) 02/22/2018   Type 2 diabetes mellitus with stage 3 chronic kidney disease ([place]) 02/22/2018   Elevated troponin 02/22/2018   Cyst of right orbit 01/01/2016   Left facial pain 11/03/2015   Acute maxillary sinusitis 03/08/2015   Hypothyroidism 01/23/2008   Seasonal and perennial allergic rhinitis 01/23/2008   Asthma, mild intermittent 01/23/2008   Esophageal reflux 01/23/2008   Essential hypertension 01/23/2008    Past Surgical History:  Procedure Laterality Date   BLADDER SUSPENSION  X 2   BUNIONECTOMY     RIGHT FOOT ARCH REPAIRED AS WELL   CATARACT EXTRACTION W/ INTRAOCULAR LENS  IMPLANT, BILATERAL Bilateral    DILATION AND CURETTAGE OF UTERUS  X 3   DUPUYTREN CONTRACTURE RELEASE Bilateral    "feet"   FASCIECTOMY Right 01/23/2018   Procedure: SEGMENTAL FASCIECTOMY RIGHT MIDDLE FINGER;  Surgeon: Daryll Brod, MD;  Location: [facility];  Service: Orthopedics;  Laterality: Right;  AXILLARY BLOCK   KNEE ARTHROSCOPY Right    REDUCTION MAMMAPLASTY Bilateral 2018   TOTAL ABDOMINAL HYSTERECTOMY  1975   "got pregnant w/IUD in then lost baby"   TOTAL KNEE ARTHROPLASTY Right 06/08/2020   Procedure: TOTAL KNEE ARTHROPLASTY;  Surgeon: Vickey Huger, MD;  Location: [facility];  Service: Orthopedics;  Laterality: Right;     OB History   No obstetric history on file.     Family History  Problem Relation Age of Onset   Pneumonia Father    Arthritis Mother       Social History   Tobacco Use   Smoking status: Never Smoker   Smokeless tobacco: Never Used  Vaping Use   Vaping Use: Never used  Substance Use Topics   Alcohol use: Not Currently    Comment: 02/22/2018 "might have a drink q couple years"   Drug use: No    Home Medications Prior to Admission medications   Medication Sig Start Date End Date Taking? Authorizing Provider  albuterol (PROAIR HFA) 108 (90 BASE) MCG/ACT inhaler Inhale 2 puffs into the lungs every 4 (four) hours as needed for wheezing or shortness of breath. 11/20/14   Baird Lyons D, MD  albuterol (PROVENTIL) (2.5 MG/3ML) 0.083% nebulizer solution Take 3 mLs (2.5 mg total) by nebulization every 6 (six) hours as needed for wheezing or shortness of breath. 11/26/14   Deneise Lever, MD  ALPRAZolam Duanne Moron) 0.5 MG tablet Take 0.5 mg by mouth at bedtime.  09/29/11   [provider]  amLODipine (NORVASC) 10 MG tablet Take 10 mg by mouth daily.     [provider]  Apoaequorin (PREVAGEN EXTRA STRENGTH PO) Take 1 tablet by mouth daily.    [provider]  benzonatate (TESSALON PERLES) 100 MG capsule Take 2 capsules (200 mg total) by mouth 2 (two) times daily as needed for cough. 06/12/20   Patriciaann Clan, DO  budesonide (PULMICORT) 0.25 MG/2ML nebulizer solution Take 2 mLs (0.25 mg total) by nebulization 2 (two) times daily. Dx: J45.20 03/01/18   Parrett, Fonnie Mu, NP  buPROPion (WELLBUTRIN XL) 150 MG 24 hr tablet Take 150 mg by mouth daily.    [provider]  donepezil (ARICEPT) 5 MG tablet Take 5 mg by mouth at bedtime.    [provider]  fluticasone (FLONASE) 50 MCG/ACT nasal spray Place 1 spray into both nostrils daily.    [provider]  Fluticasone-Salmeterol (ADVAIR DISKUS) 250-50 MCG/DOSE AEPB Inhale 3 puffs into the lungs daily.    [provider]  latanoprost (XALATAN) 0.005 % ophthalmic solution Place 1 drop into both eyes at bedtime.  08/18/15    [provider]  levothyroxine (SYNTHROID, LEVOTHROID) 50 MCG tablet Take 50 mcg by mouth daily before breakfast.  09/02/13   [provider]  montelukast (SINGULAIR) 10 MG tablet Take 10 mg by mouth at bedtime.    [provider]  oxyCODONE (OXY IR/ROXICODONE) 5 MG immediate release tablet Take 1-2 tablets (5-10 mg total) by mouth every 6 (six) hours as needed for moderate pain (pain score 4-6). 06/09/20   Donia Ast, PA  pantoprazole (PROTONIX) 40 MG tablet Take 40 mg by mouth daily.  08/29/15   [provider]  pregabalin (LYRICA) 75 MG capsule Take 75 mg by mouth 2 (two) times daily.    [provider]  rOPINIRole (REQUIP) 2 MG tablet Take 2 mg by mouth at bedtime.  11/10/14   [provider]  senna (SENOKOT) 8.6 MG TABS tablet Take 8.6 mg by mouth at bedtime.  10/30/18   [provider]  tiZANidine (ZANAFLEX) 4 MG tablet Take 1 tablet (4 mg total) by mouth every 6 (six) hours as needed for muscle spasms. 06/09/20   Donia Ast, PA  venlafaxine XR (EFFEXOR-XR) 75 MG 24 hr capsule Take 75 mg by mouth daily with breakfast.    [provider]  Vitamin D, Ergocalciferol, (DRISDOL) 50000 units CAPS capsule Take 50,000 Units by mouth once a week. 12/05/16   [provider]  XARELTO 10 MG TABS tablet Take 1 tablet (10 mg total) by mouth daily with supper. Patient taking differently: Take 10 mg by mouth daily.  09/25/19   Volanda Napoleon, MD    Allergies    Naproxen  Review of Systems   Review of Systems  Constitutional: Negative for chills, fatigue and fever.  HENT: Positive for sore throat.   Respiratory: Positive for cough and shortness of breath. Negative for wheezing and stridor.   Cardiovascular: Positive for chest pain and leg swelling. Negative for palpitations.  Gastrointestinal: Negative for abdominal pain, constipation, diarrhea, nausea and vomiting.  Genitourinary: Negative for decreased  urine volume, difficulty urinating and dysuria.  Musculoskeletal: Negative for neck pain and neck stiffness.  Skin: Negative for rash.  Neurological: Positive for headaches. Negative for dizziness, seizures, syncope, weakness, light-headedness and numbness.    Physical Exam Updated Vital Signs BP (!) 147/67    Pulse 73    Temp 98.5 F (36.9 C) (Oral)    Resp (!) 24    Ht 5\' 2"  (1.575 m)    Wt 81.6 kg    SpO2 100%    BMI 32.92 kg/m   Physical Exam Constitutional:      Appearance: She is well-developed. She is not ill-appearing.     Comments: Appears uncomfortable.  HENT:     Head: Normocephalic and atraumatic.     Mouth/Throat:     Mouth: Mucous membranes are moist.     Pharynx: No posterior oropharyngeal erythema.  Eyes:     Extraocular Movements: Extraocular movements intact.     Pupils: Pupils are equal, round, and reactive to light.  Cardiovascular:     Rate and Rhythm: Normal rate and regular rhythm.     Comments: Tender with palpation of costo-sternal joints bilaterally. Pulmonary:     Comments: Clear and equal breath sounds anterior and posteriorly.  No wheezing or crackles. Mild increased work of breathing with tachypnea present, speaking in short phrases.  No accessory muscle use.  Occasional hacking nonproductive cough.  Satting 98% on room air. Abdominal:     Palpations: Abdomen is soft.     Tenderness: There is no abdominal tenderness. There is no guarding or rebound.  Musculoskeletal:     Cervical back: Neck supple.     Left lower leg: No edema.     Comments: 1+ pitting edema to midshin present on right, dressing on right knee.  Palpable dorsalis pedis pulses bilaterally.  Lymphadenopathy:     Cervical: No cervical adenopathy.  Skin:    Findings: No rash.  Neurological:     Mental Status: She is alert.     Comments: Alert and oriented.  CN II-XII intact.  Patient to light touch intact throughout extremities.  No pronator drift.  Speech clearly understandable and  follows commands appropriately.  Able to move all extremities spontaneously.  Psychiatric:        Mood  and Affect: Mood normal.        Behavior: Behavior normal.     ED Results / Procedures / Treatments   Labs (all labs ordered are listed, but only abnormal results are displayed) Labs Reviewed  BASIC METABOLIC PANEL - Abnormal; Notable for the following components:      Result Value   Glucose, Bld 116 (*)    Calcium 8.7 (*)    All other components within normal limits  CBC - Abnormal; Notable for the following components:   RBC 3.63 (*)    Hemoglobin 9.8 (*)    HCT 30.9 (*)    All other components within normal limits  TROPONIN I (HIGH SENSITIVITY)  TROPONIN I (HIGH SENSITIVITY)    EKG EKG Interpretation  Date/Time:  Friday June 12 2020 12:41:22 EDT Ventricular Rate:  78 PR Interval:    QRS Duration: 83 QT Interval:  365 QTC Calculation: 416 R Axis:   15 Text Interpretation: Sinus rhythm Abnormal R-wave progression, early transition No significant change since last tracing Confirmed by Theotis Burrow [phone]) on 06/12/2020 12:57:16 PM   Radiology CT Angio Chest PE W and/or Wo Contrast  Result Date: 06/12/2020 CLINICAL DATA:  Shortness of breath and pleuritic chest pain following recent knee surgery. Clinical concern for pulmonary embolism. EXAM: CT ANGIOGRAPHY CHEST WITH CONTRAST TECHNIQUE: Multidetector CT imaging of the chest was performed using the standard protocol during bolus administration of intravenous contrast. Multiplanar CT image reconstructions and MIPs were obtained to evaluate the vascular anatomy. CONTRAST:  71mL OMNIPAQUE IOHEXOL 350 MG/ML SOLN COMPARISON:  Chest CTA 05/21/2019. FINDINGS: Cardiovascular: The pulmonary arteries are well opacified with contrast to the level of the subsegmental branches. There is no evidence of acute pulmonary embolism. Moderate atherosclerosis of the aorta, great vessels and coronary arteries again noted. No acute systemic  arterial abnormalities are identified. The heart size is normal. There is no pericardial effusion. Mediastinum/Nodes: There are no enlarged mediastinal, hilar or axillary lymph nodes. The thyroid gland, trachea and esophagus demonstrate no significant findings. Lungs/Pleura: No pleural effusion or pneumothorax. Minimal atelectasis at both lung bases. The lungs are otherwise clear. Upper abdomen: The gallbladder is distended without apparent wall thickening or surrounding inflammation. Stable low-density lesion in the left hepatic lobe. Possible hepatic steatosis. Low-density 11 mm splenic lesion on image 64/5 was not well seen previously, although is likely an incidental cyst. Musculoskeletal/Chest wall: There is no chest wall mass or suspicious osseous finding. Review of the MIP images confirms the above findings. IMPRESSION: 1. No evidence of acute pulmonary embolism or other acute chest process. 2. Coronary andAortic Atherosclerosis (ICD10-I70.0). Electronically Signed   By: Richardean Sale M.D.   On: 06/12/2020 15:15   DG Chest Portable 1 View  Result Date: 06/12/2020 CLINICAL DATA:  Shortness of breath, recent orthopedic surgery EXAM: PORTABLE CHEST 1 VIEW COMPARISON:  06/02/2020 FINDINGS: The heart size and mediastinal contours are within normal limits. Low lung volumes with minimal left basilar atelectasis. No pleural effusion or pneumothorax. Degenerative changes of the shoulders, left worse than right. IMPRESSION: No active disease. Electronically Signed   By: Davina Poke D.O.   On: 06/12/2020 13:41    Procedures Procedures (including critical care time)  Medications Ordered in ED Medications  acetaminophen (TYLENOL) tablet 1,000 mg (1,000 mg Oral Given 06/12/20 1318)  albuterol (VENTOLIN HFA) 108 (90 Base) MCG/ACT inhaler 4 puff (4 puffs Inhalation Given 06/12/20 1341)  ipratropium (ATROVENT HFA) inhaler 2 puff (2 puffs Inhalation Given 06/12/20 1341)  iohexol (OMNIPAQUE)  350 MG/ML  injection 100 mL (70 mLs Intravenous Contrast Given 06/12/20 1435)    ED Course  I have reviewed the triage vital signs and the nursing notes.  Pertinent labs & imaging results that were available during my care of the patient were reviewed by me and considered in my medical decision making (see chart for details).  Clinical Course as of Jun 12 1550  Fri Jun 12, 2020  1447 Reevaluated patient after return from CTA. Inhalers improved cough, still tachypneic.   [SB]    Clinical Course User Index [SB] Patriciaann Clan, DO   MDM Rules/Calculators/A&P                          74 year old female with a significant history of bilateral pulmonary embolism in 2019 on Xarelto and asthma presenting with acute dyspnea and pleuritic chest pain following a right total knee replacement approximately 4 days ago.  Reassuringly she is afebrile and satting 100% on room air without tachycardia on arrival.  Tachypneic with speaking in short phrases with non-focal/clear pulmonary exam, however exquisitely tender to anterior chest.  Concern for acute pulmonary embolism given history and patient presentation as above, will obtain CTA.  Additionally will obtain CXR prior to this as well given recent cough and hospitalization, will r/o pneumonia.  CBC, BMP to assess any contributing anemia, renal, electrolyte dysfunction. While she has no wheezing on exam, will trial DuoNeb to assess any improvement in her breathing given known asthma.  CTA returned without evidence of PE.  No evidence of pneumonia or pneumothorax. CBC and BMP around known baseline.  EKG sinus without evidence of ischemia, troponin negative.  Euvolemic on exam.  Work of breathing improved on reevaluation, appears overall comfortable.  Recommended scheduling albuterol every 4-6 hours for the next 24 hours with Tessalon Perles/honey/cough drops to help with cough.  Voltaren gel QID and ice/heat on anterior chest, Tylenol PRN.  Encourage incentive  spirometer use.  Strict return precautions discussed, follow-up if not improving or sooner if worsening.  Follow-up with PCP.  Final Clinical Impression(s) / ED Diagnoses Final diagnoses:  SOB (shortness of breath)  Costochondritis    Rx / DC Orders ED Discharge Orders         Ordered    benzonatate (TESSALON PERLES) 100 MG capsule  2 times daily PRN     Discontinue  Reprint     06/12/20 [place], [place], DO 06/12/20 1553    Little, Wenda Overland, MD 06/16/20 [phone]

## 2020-06-12 NOTE — ED Notes (Signed)
ED Provider at bedside. 

## 2020-06-12 NOTE — ED Triage Notes (Signed)
Total knee replacement 4 days ago. Here today with cough, sore throat, headache, difficulty swallowing.

## 2020-06-29 ENCOUNTER — Other Ambulatory Visit: Payer: Self-pay | Admitting: Physician Assistant

## 2020-06-29 DIAGNOSIS — Z1231 Encounter for screening mammogram for malignant neoplasm of breast: Secondary | ICD-10-CM

## 2020-07-11 ENCOUNTER — Encounter (HOSPITAL_BASED_OUTPATIENT_CLINIC_OR_DEPARTMENT_OTHER): Payer: Self-pay | Admitting: Emergency Medicine

## 2020-07-11 ENCOUNTER — Emergency Department (HOSPITAL_BASED_OUTPATIENT_CLINIC_OR_DEPARTMENT_OTHER): Payer: Medicare Other

## 2020-07-11 ENCOUNTER — Inpatient Hospital Stay (HOSPITAL_BASED_OUTPATIENT_CLINIC_OR_DEPARTMENT_OTHER)
Admission: EM | Admit: 2020-07-11 | Discharge: 2020-07-13 | DRG: 069 | Disposition: A | Discharge status: Home / Self Care | Payer: Medicare Other | Attending: Internal Medicine | Admitting: Internal Medicine

## 2020-07-11 DIAGNOSIS — E039 Hypothyroidism, unspecified: Secondary | ICD-10-CM | POA: Diagnosis present

## 2020-07-11 DIAGNOSIS — J3089 Other allergic rhinitis: Secondary | ICD-10-CM | POA: Diagnosis not present

## 2020-07-11 DIAGNOSIS — Z86718 Personal history of other venous thrombosis and embolism: Secondary | ICD-10-CM

## 2020-07-11 DIAGNOSIS — K219 Gastro-esophageal reflux disease without esophagitis: Secondary | ICD-10-CM | POA: Diagnosis present

## 2020-07-11 DIAGNOSIS — Z888 Allergy status to other drugs, medicaments and biological substances status: Secondary | ICD-10-CM

## 2020-07-11 DIAGNOSIS — R42 Dizziness and giddiness: Secondary | ICD-10-CM

## 2020-07-11 DIAGNOSIS — N1831 Chronic kidney disease, stage 3a: Secondary | ICD-10-CM | POA: Diagnosis present

## 2020-07-11 DIAGNOSIS — J302 Other seasonal allergic rhinitis: Secondary | ICD-10-CM | POA: Diagnosis not present

## 2020-07-11 DIAGNOSIS — I10 Essential (primary) hypertension: Secondary | ICD-10-CM | POA: Diagnosis not present

## 2020-07-11 DIAGNOSIS — R4781 Slurred speech: Secondary | ICD-10-CM | POA: Diagnosis present

## 2020-07-11 DIAGNOSIS — Z7901 Long term (current) use of anticoagulants: Secondary | ICD-10-CM | POA: Diagnosis not present

## 2020-07-11 DIAGNOSIS — Z8261 Family history of arthritis: Secondary | ICD-10-CM

## 2020-07-11 DIAGNOSIS — G4700 Insomnia, unspecified: Secondary | ICD-10-CM | POA: Diagnosis present

## 2020-07-11 DIAGNOSIS — J4520 Mild intermittent asthma, uncomplicated: Secondary | ICD-10-CM | POA: Diagnosis not present

## 2020-07-11 DIAGNOSIS — G8929 Other chronic pain: Secondary | ICD-10-CM | POA: Diagnosis present

## 2020-07-11 DIAGNOSIS — F0390 Unspecified dementia without behavioral disturbance: Secondary | ICD-10-CM | POA: Diagnosis present

## 2020-07-11 DIAGNOSIS — G459 Transient cerebral ischemic attack, unspecified: Principal | ICD-10-CM | POA: Insufficient documentation

## 2020-07-11 DIAGNOSIS — I129 Hypertensive chronic kidney disease with stage 1 through stage 4 chronic kidney disease, or unspecified chronic kidney disease: Secondary | ICD-10-CM | POA: Diagnosis present

## 2020-07-11 DIAGNOSIS — Z79899 Other long term (current) drug therapy: Secondary | ICD-10-CM

## 2020-07-11 DIAGNOSIS — H6123 Impacted cerumen, bilateral: Secondary | ICD-10-CM | POA: Diagnosis present

## 2020-07-11 DIAGNOSIS — G2581 Restless legs syndrome: Secondary | ICD-10-CM

## 2020-07-11 DIAGNOSIS — M720 Palmar fascial fibromatosis [Dupuytren]: Secondary | ICD-10-CM | POA: Diagnosis present

## 2020-07-11 DIAGNOSIS — D1809 Hemangioma of other sites: Secondary | ICD-10-CM | POA: Diagnosis present

## 2020-07-11 DIAGNOSIS — Z7951 Long term (current) use of inhaled steroids: Secondary | ICD-10-CM

## 2020-07-11 DIAGNOSIS — K5909 Other constipation: Secondary | ICD-10-CM | POA: Diagnosis present

## 2020-07-11 DIAGNOSIS — N183 Chronic kidney disease, stage 3 unspecified: Secondary | ICD-10-CM | POA: Diagnosis present

## 2020-07-11 DIAGNOSIS — Z9842 Cataract extraction status, left eye: Secondary | ICD-10-CM | POA: Diagnosis not present

## 2020-07-11 DIAGNOSIS — Z20822 Contact with and (suspected) exposure to covid-19: Secondary | ICD-10-CM | POA: Diagnosis present

## 2020-07-11 DIAGNOSIS — Z79890 Hormone replacement therapy (postmenopausal): Secondary | ICD-10-CM

## 2020-07-11 DIAGNOSIS — Z86711 Personal history of pulmonary embolism: Secondary | ICD-10-CM

## 2020-07-11 DIAGNOSIS — Z961 Presence of intraocular lens: Secondary | ICD-10-CM | POA: Diagnosis present

## 2020-07-11 DIAGNOSIS — R410 Disorientation, unspecified: Secondary | ICD-10-CM | POA: Diagnosis not present

## 2020-07-11 DIAGNOSIS — Z9841 Cataract extraction status, right eye: Secondary | ICD-10-CM

## 2020-07-11 DIAGNOSIS — R29810 Facial weakness: Secondary | ICD-10-CM | POA: Diagnosis present

## 2020-07-11 DIAGNOSIS — Z96651 Presence of right artificial knee joint: Secondary | ICD-10-CM | POA: Diagnosis present

## 2020-07-11 DIAGNOSIS — J4530 Mild persistent asthma, uncomplicated: Secondary | ICD-10-CM | POA: Diagnosis present

## 2020-07-11 DIAGNOSIS — R569 Unspecified convulsions: Secondary | ICD-10-CM | POA: Diagnosis not present

## 2020-07-11 DIAGNOSIS — Z90710 Acquired absence of both cervix and uterus: Secondary | ICD-10-CM | POA: Diagnosis not present

## 2020-07-11 DIAGNOSIS — E1122 Type 2 diabetes mellitus with diabetic chronic kidney disease: Secondary | ICD-10-CM | POA: Diagnosis present

## 2020-07-11 DIAGNOSIS — J4521 Mild intermittent asthma with (acute) exacerbation: Secondary | ICD-10-CM | POA: Diagnosis not present

## 2020-07-11 DIAGNOSIS — R269 Unspecified abnormalities of gait and mobility: Secondary | ICD-10-CM | POA: Diagnosis present

## 2020-07-11 DIAGNOSIS — R300 Dysuria: Secondary | ICD-10-CM | POA: Diagnosis present

## 2020-07-11 DIAGNOSIS — G43109 Migraine with aura, not intractable, without status migrainosus: Secondary | ICD-10-CM | POA: Diagnosis present

## 2020-07-11 LAB — CBC WITH DIFFERENTIAL/PLATELET
Abs Immature Granulocytes: 0.02 10*3/uL (ref 0.00–0.07)
Basophils Absolute: 0.1 10*3/uL (ref 0.0–0.1)
Basophils Relative: 1 %
Eosinophils Absolute: 0.3 10*3/uL (ref 0.0–0.5)
Eosinophils Relative: 3 %
HCT: 35.8 % — ABNORMAL LOW (ref 36.0–46.0)
Hemoglobin: 11.1 g/dL — ABNORMAL LOW (ref 12.0–15.0)
Immature Granulocytes: 0 %
Lymphocytes Relative: 57 %
Lymphs Abs: 5.5 10*3/uL — ABNORMAL HIGH (ref 0.7–4.0)
MCH: 26.7 pg (ref 26.0–34.0)
MCHC: 31 g/dL (ref 30.0–36.0)
MCV: 86.3 fL (ref 80.0–100.0)
Monocytes Absolute: 0.6 10*3/uL (ref 0.1–1.0)
Monocytes Relative: 7 %
Neutro Abs: 3 10*3/uL (ref 1.7–7.7)
Neutrophils Relative %: 32 %
Platelets: 392 10*3/uL (ref 150–400)
RBC: 4.15 MIL/uL (ref 3.87–5.11)
RDW: 16.9 % — ABNORMAL HIGH (ref 11.5–15.5)
WBC: 9.4 10*3/uL (ref 4.0–10.5)
nRBC: 0 % (ref 0.0–0.2)

## 2020-07-11 LAB — COMPREHENSIVE METABOLIC PANEL
ALT: 15 U/L (ref 0–44)
AST: 27 U/L (ref 15–41)
Albumin: 3.4 g/dL — ABNORMAL LOW (ref 3.5–5.0)
Alkaline Phosphatase: 109 U/L (ref 38–126)
Anion gap: 12 (ref 5–15)
BUN: 14 mg/dL (ref 8–23)
CO2: 24 mmol/L (ref 22–32)
Calcium: 9.3 mg/dL (ref 8.9–10.3)
Chloride: 103 mmol/L (ref 98–111)
Creatinine, Ser: 0.95 mg/dL (ref 0.44–1.00)
GFR calc Af Amer: >60 mL/min (ref >60)
GFR calc non Af Amer: 60 mL/min — ABNORMAL LOW (ref >60)
Glucose, Bld: 96 mg/dL (ref 70–99)
Potassium: 3.9 mmol/L (ref 3.5–5.1)
Sodium: 139 mmol/L (ref 135–145)
Total Bilirubin: 0.5 mg/dL (ref 0.3–1.2)
Total Protein: 6.3 g/dL — ABNORMAL LOW (ref 6.5–8.1)

## 2020-07-11 LAB — URINALYSIS, ROUTINE W REFLEX MICROSCOPIC
Bilirubin Urine: NEGATIVE
Glucose, UA: NEGATIVE mg/dL
Hgb urine dipstick: NEGATIVE
Ketones, ur: NEGATIVE mg/dL
Leukocytes,Ua: NEGATIVE
Nitrite: NEGATIVE
Protein, ur: NEGATIVE mg/dL
Specific Gravity, Urine: 1.015 (ref 1.005–1.030)
pH: 8.5 — ABNORMAL HIGH (ref 5.0–8.0)

## 2020-07-11 LAB — SARS CORONAVIRUS 2 BY RT PCR (HOSPITAL ORDER, PERFORMED IN ~~LOC~~ HOSPITAL LAB): SARS Coronavirus 2: NEGATIVE

## 2020-07-11 MED ORDER — VENLAFAXINE HCL ER 75 MG PO CP24
150.0000 mg | ORAL_CAPSULE | Freq: Every day | ORAL | Status: DC
  Administered 2020-07-12 – 2020-07-13 (×2): 150 mg via ORAL
  Filled 2020-07-11 (×3): qty 2

## 2020-07-11 MED ORDER — ACETAMINOPHEN 160 MG/5ML PO SOLN: 650.0000 mg | ORAL | Status: DC | PRN

## 2020-07-11 MED ORDER — TIZANIDINE HCL 4 MG PO TABS: 4.0000 mg | ORAL_TABLET | Freq: Four times a day (QID) | ORAL | Status: DC | PRN

## 2020-07-11 MED ORDER — FLUTICASONE PROPIONATE 50 MCG/ACT NA SUSP
1.0000 | Freq: Every day | NASAL | Status: DC
  Administered 2020-07-12 – 2020-07-13 (×2): 1 via NASAL
  Filled 2020-07-11: qty 16

## 2020-07-11 MED ORDER — SENNA 8.6 MG PO TABS
8.6000 mg | ORAL_TABLET | Freq: Every day | ORAL | Status: DC
  Administered 2020-07-11 – 2020-07-12 (×2): 8.6 mg via ORAL
  Filled 2020-07-11 (×2): qty 1

## 2020-07-11 MED ORDER — OXYCODONE HCL 5 MG PO TABS: 5.0000 mg | ORAL_TABLET | Freq: Four times a day (QID) | ORAL | Status: DC | PRN

## 2020-07-11 MED ORDER — BUPROPION HCL ER (XL) 150 MG PO TB24
150.0000 mg | ORAL_TABLET | Freq: Every day | ORAL | Status: DC
  Administered 2020-07-12 – 2020-07-13 (×2): 150 mg via ORAL
  Filled 2020-07-11 (×2): qty 1

## 2020-07-11 MED ORDER — LEVOTHYROXINE SODIUM 50 MCG PO TABS
50.0000 ug | ORAL_TABLET | Freq: Every day | ORAL | Status: DC
  Administered 2020-07-12 – 2020-07-13 (×2): 50 ug via ORAL
  Filled 2020-07-11 (×2): qty 1

## 2020-07-11 MED ORDER — ACETAMINOPHEN 650 MG RE SUPP: 650.0000 mg | RECTAL | Status: DC | PRN

## 2020-07-11 MED ORDER — PREGABALIN 75 MG PO CAPS
75.0000 mg | ORAL_CAPSULE | Freq: Two times a day (BID) | ORAL | Status: DC
  Administered 2020-07-11 – 2020-07-13 (×4): 75 mg via ORAL
  Filled 2020-07-11 (×4): qty 1

## 2020-07-11 MED ORDER — MECLIZINE HCL 25 MG PO TABS
50.0000 mg | ORAL_TABLET | Freq: Once | ORAL | Status: AC
  Administered 2020-07-11: 50 mg via ORAL
  Filled 2020-07-11: qty 2

## 2020-07-11 MED ORDER — LATANOPROST 0.005 % OP SOLN
1.0000 [drp] | Freq: Every day | OPHTHALMIC | Status: DC
  Administered 2020-07-11 – 2020-07-12 (×2): 1 [drp] via OPHTHALMIC
  Filled 2020-07-11: qty 2.5

## 2020-07-11 MED ORDER — ALBUTEROL SULFATE HFA 108 (90 BASE) MCG/ACT IN AERS: 2.0000 | INHALATION_SPRAY | RESPIRATORY_TRACT | Status: DC | PRN

## 2020-07-11 MED ORDER — DONEPEZIL HCL 10 MG PO TABS
10.0000 mg | ORAL_TABLET | Freq: Every day | ORAL | Status: DC
  Administered 2020-07-11 – 2020-07-12 (×2): 10 mg via ORAL
  Filled 2020-07-11 (×2): qty 1

## 2020-07-11 MED ORDER — PANTOPRAZOLE SODIUM 40 MG PO TBEC
40.0000 mg | DELAYED_RELEASE_TABLET | Freq: Every day | ORAL | Status: DC
  Administered 2020-07-11 – 2020-07-13 (×3): 40 mg via ORAL
  Filled 2020-07-11 (×3): qty 1

## 2020-07-11 MED ORDER — STROKE: EARLY STAGES OF RECOVERY BOOK
Freq: Once | Status: AC
  Filled 2020-07-11: qty 1

## 2020-07-11 MED ORDER — AMLODIPINE BESYLATE 10 MG PO TABS
10.0000 mg | ORAL_TABLET | Freq: Every day | ORAL | Status: DC
  Administered 2020-07-12 – 2020-07-13 (×2): 10 mg via ORAL
  Filled 2020-07-11 (×2): qty 1

## 2020-07-11 MED ORDER — GADOBUTROL 1 MMOL/ML IV SOLN
8.0000 mL | Freq: Once | INTRAVENOUS | Status: AC | PRN
  Administered 2020-07-11: 8 mL via INTRAVENOUS

## 2020-07-11 MED ORDER — RIVAROXABAN 10 MG PO TABS
10.0000 mg | ORAL_TABLET | Freq: Every day | ORAL | Status: DC
  Administered 2020-07-11 – 2020-07-13 (×3): 10 mg via ORAL
  Filled 2020-07-11 (×3): qty 1

## 2020-07-11 MED ORDER — ROPINIROLE HCL 1 MG PO TABS
2.0000 mg | ORAL_TABLET | Freq: Every day | ORAL | Status: DC
  Administered 2020-07-11 – 2020-07-12 (×2): 2 mg via ORAL
  Filled 2020-07-11 (×2): qty 2

## 2020-07-11 MED ORDER — FLUTICASONE FUROATE-VILANTEROL 200-25 MCG/INH IN AEPB
1.0000 | INHALATION_SPRAY | Freq: Every day | RESPIRATORY_TRACT | Status: DC
  Administered 2020-07-12 – 2020-07-13 (×2): 1 via RESPIRATORY_TRACT
  Filled 2020-07-11: qty 28

## 2020-07-11 MED ORDER — POLYETHYLENE GLYCOL 3350 17 G PO PACK
17.0000 g | PACK | Freq: Every day | ORAL | Status: DC
  Filled 2020-07-11 (×2): qty 1

## 2020-07-11 MED ORDER — BENZONATATE 100 MG PO CAPS
200.0000 mg | ORAL_CAPSULE | Freq: Two times a day (BID) | ORAL | Status: DC | PRN
  Administered 2020-07-12: 200 mg via ORAL
  Filled 2020-07-11: qty 2

## 2020-07-11 MED ORDER — ACETAMINOPHEN 325 MG PO TABS
650.0000 mg | ORAL_TABLET | ORAL | Status: DC | PRN
  Administered 2020-07-11 – 2020-07-12 (×3): 650 mg via ORAL
  Filled 2020-07-11 (×3): qty 2

## 2020-07-11 MED ORDER — ALBUTEROL SULFATE (2.5 MG/3ML) 0.083% IN NEBU
2.5000 mg | INHALATION_SOLUTION | RESPIRATORY_TRACT | Status: DC | PRN
  Filled 2020-07-11: qty 3

## 2020-07-11 MED ORDER — MONTELUKAST SODIUM 10 MG PO TABS
10.0000 mg | ORAL_TABLET | Freq: Every day | ORAL | Status: DC
  Administered 2020-07-11 – 2020-07-12 (×2): 10 mg via ORAL
  Filled 2020-07-11 (×2): qty 1

## 2020-07-11 MED ORDER — ALPRAZOLAM 0.5 MG PO TABS
0.5000 mg | ORAL_TABLET | Freq: Every day | ORAL | Status: DC
  Administered 2020-07-11 – 2020-07-12 (×2): 0.5 mg via ORAL
  Filled 2020-07-11 (×2): qty 1

## 2020-07-11 NOTE — ED Triage Notes (Addendum)
Brought in by EMS c/o headache with dizziness and blurred vision x 1 week. Family reports some confusion today. Also endorses dysuria.

## 2020-07-11 NOTE — H&P (Signed)
[facility] History and Physical  CYD HOSTLER LNL:[id number] DOB: 01-17-1946 DOA: 07/11/2020  Referring EDP: Aundra Dubin from [place] PCP: Blair Heys, PA-C   Chief Complaint: Headache and dizziness  HPI: WAHNETA DEROCHER is a 74 y.o. female with PMH of HTN, hypothyroidism, PE's on Xarelto, Asthma, CKD, T2DM, RLS, insomnia and chronic pain who presented to [facility] for dizziness and headache and transferred to [facility] for TIA workup.  Patient reports presenting to ED today due to progressively worsening headache and dizziness. Reports waking up around 0500 to use the restroom and as soon as she set up reports she felt the room spinning and started to feel like there was a "vice on her head." She made it to the bathroom and ultimately to the kitchen for coffee and a bowl of cereal. Reports she does not remember what happened after that but her brother found her a couple hours later and noted her speech to be slurred with confusion. Symptoms resolved currently but does feel like her headache or the tightening on her head is starting to return. She has experienced similar symptoms for about the past 5 weeks about 3-4 times weekly but feels they are worsening. Also reports that she will have tingling and warmth on the left side of her face during episodes and that this has started to move to the right side of her face and down her body with more recent episodes. Reports chronic cough and SOB secondary to asthma. Received COVID vaccine two days ago. Also has chronic constipation and GERD. Denies fever, chills, chest pain, abdominal pain, nausea, vomiting, diarrhea, dysuria, hematuria, hematochezia, melena, trouble eating, or any other complaints.  In the outside ED: Vitals stable on room air. Labs remarkable for normal UA. CBC and CMP at baseline. COVID negative.   CXR: Non-acute CT Head and MRI Brain: No acute findings.  Neuro consulted and recommended admission for TIA workup.   Review of  Systems:  All other systems negative unless noted above in HPI.   Past Medical History:  Diagnosis Date  . Allergic rhinitis   . Allergic rhinitis   . Anxiety   . Arthritis    "hands, feet, shoulders, arms" (02/22/2018)  . Bilateral pulmonary embolism ([place]) 02/21/2018  . Chronic cough   . Depression   . Dupuytren contracture    "both feet, both hands" (02/22/2018)  . DVT (deep venous thrombosis) (HCC)    bil le  . GERD (gastroesophageal reflux disease)   . Glaucoma   . Hypertension   . Hypothyroidism   . Mild persistent asthma    cougher not a wheezr gets chokes easily  . Pneumonia   . PONV (postoperative nausea and vomiting)    Past Surgical History:  Procedure Laterality Date  . BLADDER SUSPENSION  X 2  . BUNIONECTOMY     RIGHT FOOT ARCH REPAIRED AS WELL  . CATARACT EXTRACTION W/ INTRAOCULAR LENS  IMPLANT, BILATERAL Bilateral   . DILATION AND CURETTAGE OF UTERUS  X 3  . DUPUYTREN CONTRACTURE RELEASE Bilateral    "feet"  . FASCIECTOMY Right 01/23/2018   Procedure: SEGMENTAL FASCIECTOMY RIGHT MIDDLE FINGER;  Surgeon: Daryll Brod, MD;  Location: [place];  Service: Orthopedics;  Laterality: Right;  AXILLARY BLOCK  . KNEE ARTHROSCOPY Right   . REDUCTION MAMMAPLASTY Bilateral 2018  . TOTAL ABDOMINAL HYSTERECTOMY  1975   "got pregnant w/IUD in then lost baby"  . TOTAL KNEE ARTHROPLASTY Right 06/08/2020   Procedure: TOTAL KNEE ARTHROPLASTY;  Surgeon:  Vickey Huger, MD;  Location: [facility];  Service: Orthopedics;  Laterality: Right;   Social History:  reports that she has never smoked. She has never used smokeless tobacco. She reports previous alcohol use. She reports that she does not use drugs.  Allergies  Allergen Reactions  . Naproxen Shortness Of Breath, Itching and Swelling    Family History  Problem Relation Age of Onset  . Pneumonia Father   . Arthritis Mother     Prior to Admission medications   Medication Sig Start Date End Date Taking? Authorizing  Provider  albuterol (PROAIR HFA) 108 (90 BASE) MCG/ACT inhaler Inhale 2 puffs into the lungs every 4 (four) hours as needed for wheezing or shortness of breath. 11/20/14   Baird Lyons D, MD  albuterol (PROVENTIL) (2.5 MG/3ML) 0.083% nebulizer solution Take 3 mLs (2.5 mg total) by nebulization every 6 (six) hours as needed for wheezing or shortness of breath. 11/26/14   Deneise Lever, MD  ALPRAZolam Duanne Moron) 0.5 MG tablet Take 0.5 mg by mouth at bedtime.  09/29/11   [provider]  amLODipine (NORVASC) 10 MG tablet Take 10 mg by mouth daily.     [provider]  Apoaequorin (PREVAGEN EXTRA STRENGTH PO) Take 1 tablet by mouth daily.    [provider]  benzonatate (TESSALON PERLES) 100 MG capsule Take 2 capsules (200 mg total) by mouth 2 (two) times daily as needed for cough. 06/12/20   Patriciaann Clan, DO  budesonide (PULMICORT) 0.25 MG/2ML nebulizer solution Take 2 mLs (0.25 mg total) by nebulization 2 (two) times daily. Dx: J45.20 03/01/18   Parrett, Fonnie Mu, NP  buPROPion (WELLBUTRIN XL) 150 MG 24 hr tablet Take 150 mg by mouth daily.    [provider]  donepezil (ARICEPT) 5 MG tablet Take 5 mg by mouth at bedtime.    [provider]  fluticasone (FLONASE) 50 MCG/ACT nasal spray Place 1 spray into both nostrils daily.    [provider]  Fluticasone-Salmeterol (ADVAIR DISKUS) 250-50 MCG/DOSE AEPB Inhale 3 puffs into the lungs daily.    [provider]  latanoprost (XALATAN) 0.005 % ophthalmic solution Place 1 drop into both eyes at bedtime.  08/18/15   [provider]  levothyroxine (SYNTHROID, LEVOTHROID) 50 MCG tablet Take 50 mcg by mouth daily before breakfast.  09/02/13   [provider]  montelukast (SINGULAIR) 10 MG tablet Take 10 mg by mouth at bedtime.    [provider]  oxyCODONE (OXY IR/ROXICODONE) 5 MG immediate release tablet Take 1-2 tablets (5-10 mg total) by mouth every 6 (six) hours as  needed for moderate pain (pain score 4-6). 06/09/20   Donia Ast, PA  pantoprazole (PROTONIX) 40 MG tablet Take 40 mg by mouth daily.  08/29/15   [provider]  pregabalin (LYRICA) 75 MG capsule Take 75 mg by mouth 2 (two) times daily.    [provider]  rOPINIRole (REQUIP) 2 MG tablet Take 2 mg by mouth at bedtime.  11/10/14   [provider]  senna (SENOKOT) 8.6 MG TABS tablet Take 8.6 mg by mouth at bedtime.  10/30/18   [provider]  tiZANidine (ZANAFLEX) 4 MG tablet Take 1 tablet (4 mg total) by mouth every 6 (six) hours as needed for muscle spasms. 06/09/20   Donia Ast, PA  venlafaxine XR (EFFEXOR-XR) 75 MG 24 hr capsule Take 75 mg by mouth daily with breakfast.    [provider]  Vitamin D, Ergocalciferol, (DRISDOL)  50000 units CAPS capsule Take 50,000 Units by mouth once a week. 12/05/16   [provider]  XARELTO 10 MG TABS tablet Take 1 tablet (10 mg total) by mouth daily with supper. Patient taking differently: Take 10 mg by mouth daily.  09/25/19   Volanda Napoleon, MD   Physical Exam: Vitals:   07/11/20 1711 07/11/20 1900 07/11/20 1933 07/11/20 2045  BP: (!) 158/63 120/79 (!) 139/58 (!) 143/57  Pulse: 80 96 83 81  Resp: 14 20 20 16   Temp:   98.1 F (36.7 C) 97.8 F (36.6 C)  TempSrc:   Oral Oral  SpO2: 97% 94% 97% 100%  Weight:      Height:        Wt Readings from Last 3 Encounters:  07/11/20 80.7 kg  06/12/20 81.6 kg  06/08/20 81.8 kg    . General:  Appears calm and comfortable. AAOx4.  . Eyes: EOMI, normal lids, irises & conjunctiva . ENT: grossly normal hearing, lips & tongue . Neck: normal ROM . Cardiovascular: RRR, no m/r/g. No LE edema. Marland Kitchen Respiratory: CTA bilaterally, no w/r/r. Normal respiratory effort. . Abdomen: soft, ntnd . Skin: no rash or induration seen on limited exam . Musculoskeletal: grossly normal tone BUE/BLE . Psychiatric: grossly normal mood and affect, speech fluent  and appropriate but tangential  . Neurologic: grossly non-focal. CN 2-12 intact. Did no observe gait.           Labs on Admission:  Basic Metabolic Panel: Recent Labs  Lab 07/11/20 1200  NA 139  K 3.9  CL 103  CO2 24  GLUCOSE 96  BUN 14  CREATININE 0.95  CALCIUM 9.3   Liver Function Tests: Recent Labs  Lab 07/11/20 1200  AST 27  ALT 15  ALKPHOS 109  BILITOT 0.5  PROT 6.3*  ALBUMIN 3.4*   No results for input(s): LIPASE, AMYLASE in the last 168 hours. No results for input(s): AMMONIA in the last 168 hours. CBC: Recent Labs  Lab 07/11/20 1200  WBC 9.4  NEUTROABS 3.0  HGB 11.1*  HCT 35.8*  MCV 86.3  PLT 392   Cardiac Enzymes: No results for input(s): CKTOTAL, CKMB, CKMBINDEX, TROPONINI in the last 168 hours.  BNP (last 3 results) No results for input(s): BNP in the last 8760 hours.  ProBNP (last 3 results) No results for input(s): PROBNP in the last 8760 hours.  CBG: No results for input(s): GLUCAP in the last 168 hours.  Radiological Exams on Admission: CT Head Wo Contrast  Result Date: 07/11/2020 CLINICAL DATA:  Increased dizziness today.  History of vertigo. EXAM: CT HEAD WITHOUT CONTRAST TECHNIQUE: Contiguous axial images were obtained from the base of the skull through the vertex without intravenous contrast. COMPARISON:  Cranial MRI 11/15/2015. FINDINGS: Brain: There is no evidence of acute intracranial hemorrhage, mass lesion, brain edema or extra-axial fluid collection. The ventricles and subarachnoid spaces are appropriately sized for age. There is no CT evidence of acute cortical infarction. Stable mild chronic small vessel ischemic changes in the periventricular white matter. Vascular: Intracranial vascular calcifications. No hyperdense vessel identified. Skull: Negative for fracture or focal lesion. Sinuses/Orbits: The visualized paranasal sinuses and mastoid air cells are clear. No acute orbital abnormalities are seen. Small extraconal lesion  medially in the right orbit adjacent to the medial rectus muscle is unchanged (image 21/4). Other: None. IMPRESSION: No acute intracranial findings. Stable mild chronic small vessel ischemic changes. Electronically Signed   By: Richardean Sale M.D.   On:  07/11/2020 12:56   MR Brain W and Wo Contrast  Result Date: 07/11/2020 CLINICAL DATA:  Transient episode of whole-body weakness and numbness. Delayed speech. Third time having such an episode. EXAM: MRI HEAD WITHOUT AND WITH CONTRAST TECHNIQUE: Multiplanar, multiecho pulse sequences of the brain and surrounding structures were obtained without and with intravenous contrast. CONTRAST:  81mL GADAVIST GADOBUTROL 1 MMOL/ML IV SOLN COMPARISON:  CT head without contrast 07/11/2020. MR head without and with contrast 11/15/2015 FINDINGS: Brain: No acute infarct, hemorrhage, or mass lesion is present. No significant white matter lesions are present. The ventricles are of normal size. No significant extraaxial fluid collection is present. The internal auditory canals are within normal limits. The brainstem and cerebellum are within normal limits. Vascular: Flow is present in the major intracranial arteries. Skull and upper cervical spine: The craniocervical junction is normal. Upper cervical spine is within normal limits. Marrow signal is unremarkable. Sinuses/Orbits: The paranasal sinuses and mastoid air cells are clear. An extra conal solid and cystic lesion is present in the medial right orbit, medially displacing the right medial rectus muscle. Lesion measures 17 x 5 x 7 mm. Posterior aspect of the lesion enhances. The lesion has not changed in size. No additional lesions are present. IMPRESSION: 1. Stable extra conal solid and cystic lesion in the medial right orbit. 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 It appears to be vascular lesion, unchanged in size over time. Cavernous hemangioma is also considered. 2. Normal MRI appearance of the brain.  Electronically Signed   By: San Morelle M.D.   On: 07/11/2020 15:57   DG Chest Portable 1 View  Result Date: 07/11/2020 CLINICAL DATA:  Cough. EXAM: PORTABLE CHEST 1 VIEW COMPARISON:  06/12/2020 FINDINGS: The heart size and mediastinal contours are within normal limits. Both lungs are clear. The visualized skeletal structures are unremarkable. IMPRESSION: No active disease. Electronically Signed   By: Marin Olp M.D.   On: 07/11/2020 12:30    EKG: Independently reviewed. HR 70. Sinus rhythm. QTc 420. No STEMI. LVH.  Assessment/Plan Principal Problem:   Dizziness Active Problems:   Hypothyroidism   Seasonal and perennial allergic rhinitis   Asthma, mild intermittent   Esophageal reflux   Essential hypertension   Type 2 diabetes mellitus with stage 3 chronic kidney disease (HCC)   CKD (chronic kidney disease) stage 3, GFR 30-59 ml/min   RLS (restless legs syndrome)  74 y.o. female with PMH of HTN, hypothyroidism, PE's on Xarelto, Asthma, CKD, T2DM, RLS, insomnia and chronic pain who presented to [facility] for dizziness and headache and transferred to [facility] for TIA workup.  Headache Dizziness TIA workup - progressively worsening headache and dizziness for past 5 weeks accompanied by confusion, speech difficulty and left-sided facial weakness/warmth/tingling  - CT Head and MRI Brain Non-acute  - Neuro consulted and will follow; messaged Dr. Leonel Ramsay on admission who plans to see patient - TIA vs complex migraine vs seizure vs polypharmacy? - Will obtain orthostatics as patient reports onset of symptoms when she sat up in bed this morning - TIA order set utilized - PT/OT/SLP - Tele - Echo - Other imaging, workup per Neuro recs - will cont home meds for now including: Xanax, Wellbutrin, Aricept, Oxycodone, Lyrica, Requip and Effexor however strongly recommend reduction/discontinuation as able inpatient vs outpatient   HTN - cont home Norvasc  Hypothyroidism - cont home  Synthroid  Hx PE's - cont home Xarelto  Asthma - cont home inhalers  GERD - cont home PPI  Chronic pain, Insomnia, Mood symptoms - as stated above: will cont home meds for now including: Xanax, Wellbutrin, Aricept, Oxycodone, Lyrica, Requip and Effexor however strongly recommend reduction/discontinuation as able inpatient vs outpatient  - Zanaflex held due to multiple medications above and no side effects to discontinuation   CKD - at baseline - CTM  T2DM - last A1c 6.9 in March 2019 - repeat A1c - no home DM medications - glucose 96 on CMP; will hold sliding scale for now  Code Status: Full DVT Prophylaxis: PTA Xarelto Family Communication: None Disposition Plan: Admit to inpatient. Patient with symptoms requiring further workup and specialty consultation. Patient is at high risk for further decompensation due to age and co-morbidities. Anticipate discharge home in 2-3 days.    Time spent: 50 minutes  Chauncey Mann, MD [facility] Pager [phone]

## 2020-07-11 NOTE — ED Notes (Signed)
Attempted to call report to [place]; this RN contact info provided for callback

## 2020-07-11 NOTE — ED Notes (Signed)
Sent UA and UC to lab

## 2020-07-11 NOTE — ED Notes (Signed)
Provided snack and coffee to pt

## 2020-07-11 NOTE — ED Provider Notes (Addendum)
[facility]ERGENCY DEPARTMENT Provider Note   CSN: [id number] Arrival date & time: 07/11/20  1114     History Chief Complaint  Patient presents with  . Headache  . Dysuria    [name] is a 74 y.o. female.  Patient is a 74 year old female with past medical history of hypertension, hypothyroidism, PEs on Xarelto, diabetes who presents to the emergency department for headache, dizziness.  Patient reports that she has been having headaches for months but for the last couple of days she has felt a headache has been worse.  It is all over the top of her head.  Reports that she woke up this morning to go to the bathroom and she felt like the room was spinning.  She had difficulty with ambulation because she felt like everything was spinning.  She was able to get downstairs and make herself something to eat and some coffee but the dizziness continued.  She then laid on the couch and felt like she was very chilled and could not get warm.  She reports that she lives with her brother who came downstairs and noticed that she is wrapped up in blankets and had slurred speech.  She reports that she is feeling somewhat better now.  Having only slight dizziness and slight headache.  Denies any focal weakness, numbness, tingling, facial droop.  She does report a history of asthma as well and has had a slight increased cough.  She had her first dose of the Pfizer COVID-19 vaccine 2 days ago.        Past Medical History:  Diagnosis Date  . Allergic rhinitis   . Allergic rhinitis   . Anxiety   . Arthritis    "hands, feet, shoulders, arms" (02/22/2018)  . 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 Chronic cough   . Depression   . Dupuytren contracture    "both feet, both hands" (02/22/2018)  . DVT (deep venous thrombosis) (HCC)    bil le  . GERD (gastroesophageal reflux disease)   . Glaucoma   . Hypertension   . Hypothyroidism   . Mild persistent asthma    cougher not a  wheezr gets chokes easily  . Pneumonia   . PONV (postoperative nausea and vomiting)     Patient Active Problem List   Diagnosis Date Noted  . TIA (transient ischemic attack) 07/11/2020  . 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 DOE (dyspnea on exertion) 12/03/2019  . CKD (chronic kidney disease) stage 3, GFR 30-59 ml/min 02/24/2018  . Acute pulmonary embolism ([place]) 02/22/2018  . Type 2 diabetes mellitus with stage 3 chronic kidney disease ([place]) 02/22/2018  . Elevated troponin 02/22/2018  . Cyst of right orbit 01/01/2016  . Left facial pain 11/03/2015  . Acute maxillary sinusitis 03/08/2015  . Hypothyroidism 01/23/2008  . Seasonal and perennial allergic rhinitis 01/23/2008  . Asthma, mild intermittent 01/23/2008  . Esophageal reflux 01/23/2008  . Essential hypertension 01/23/2008    Past Surgical History:  Procedure Laterality Date  . BLADDER SUSPENSION  X 2  . BUNIONECTOMY     RIGHT FOOT ARCH REPAIRED AS WELL  . CATARACT EXTRACTION W/ INTRAOCULAR LENS  IMPLANT, BILATERAL Bilateral   . DILATION AND CURETTAGE OF UTERUS  X 3  . DUPUYTREN CONTRACTURE RELEASE Bilateral    "feet"  . FASCIECTOMY Right 01/23/2018   Procedure: SEGMENTAL FASCIECTOMY RIGHT MIDDLE FINGER;  Surgeon: Daryll Brod, MD;  Location: [facility];  Service: Orthopedics;  Laterality: Right;  AXILLARY BLOCK  . KNEE ARTHROSCOPY Right   . REDUCTION MAMMAPLASTY Bilateral 2018  . TOTAL ABDOMINAL HYSTERECTOMY  1975   "got pregnant w/IUD in then lost baby"  . TOTAL KNEE ARTHROPLASTY Right 06/08/2020   Procedure: TOTAL KNEE ARTHROPLASTY;  Surgeon: Vickey Huger, MD;  Location: [facility];  Service: Orthopedics;  Laterality: Right;     OB History   No obstetric history on file.     Family History  Problem Relation Age of Onset  . Pneumonia Father   . Arthritis Mother     Social History   Tobacco Use  . Smoking status: Never Smoker  . Smokeless tobacco: Never Used  Vaping Use  . Vaping Use: Never  used  Substance Use Topics  . Alcohol use: Not Currently    Comment: 02/22/2018 "might have a drink q couple years"  . Drug use: No    Home Medications Prior to Admission medications   Medication Sig Start Date End Date Taking? Authorizing Provider  albuterol (PROAIR HFA) 108 (90 BASE) MCG/ACT inhaler Inhale 2 puffs into the lungs every 4 (four) hours as needed for wheezing or shortness of breath. 11/20/14   Baird Lyons D, MD  albuterol (PROVENTIL) (2.5 MG/3ML) 0.083% nebulizer solution Take 3 mLs (2.5 mg total) by nebulization every 6 (six) hours as needed for wheezing or shortness of breath. 11/26/14   Deneise Lever, MD  ALPRAZolam Duanne Moron) 0.5 MG tablet Take 0.5 mg by mouth at bedtime.  09/29/11   [provider]  amLODipine (NORVASC) 10 MG tablet Take 10 mg by mouth daily.     [provider]  Apoaequorin (PREVAGEN EXTRA STRENGTH PO) Take 1 tablet by mouth daily.    [provider]  benzonatate (TESSALON PERLES) 100 MG capsule Take 2 capsules (200 mg total) by mouth 2 (two) times daily as needed for cough. 06/12/20   Patriciaann Clan, DO  budesonide (PULMICORT) 0.25 MG/2ML nebulizer solution Take 2 mLs (0.25 mg total) by nebulization 2 (two) times daily. Dx: J45.20 03/01/18   Parrett, Fonnie Mu, NP  buPROPion (WELLBUTRIN XL) 150 MG 24 hr tablet Take 150 mg by mouth daily.    [provider]  donepezil (ARICEPT) 5 MG tablet Take 5 mg by mouth at bedtime.    [provider]  fluticasone (FLONASE) 50 MCG/ACT nasal spray Place 1 spray into both nostrils daily.    [provider]  Fluticasone-Salmeterol (ADVAIR DISKUS) 250-50 MCG/DOSE AEPB Inhale 3 puffs into the lungs daily.    [provider]  latanoprost (XALATAN) 0.005 % ophthalmic solution Place 1 drop into both eyes at bedtime.  08/18/15   [provider]  levothyroxine (SYNTHROID, LEVOTHROID) 50 MCG tablet Take 50 mcg by mouth daily before breakfast.  09/02/13   [provider]  montelukast (SINGULAIR) 10 MG tablet Take 10 mg by mouth at bedtime.    [provider]  oxyCODONE (OXY IR/ROXICODONE) 5 MG immediate release tablet Take 1-2 tablets (5-10 mg total) by mouth every 6 (six) hours as needed for moderate pain (pain score 4-6). 06/09/20   Donia Ast, PA  pantoprazole (PROTONIX) 40 MG tablet Take 40 mg by mouth daily.  08/29/15   [provider]  pregabalin (LYRICA) 75 MG capsule Take 75 mg by mouth 2 (two) times daily.    [provider]  rOPINIRole (REQUIP) 2 MG tablet Take 2 mg by mouth at bedtime.  11/10/14   [provider]  senna (SENOKOT) 8.6 MG  TABS tablet Take 8.6 mg by mouth at bedtime.  10/30/18   [provider]  tiZANidine (ZANAFLEX) 4 MG tablet Take 1 tablet (4 mg total) by mouth every 6 (six) hours as needed for muscle spasms. 06/09/20   Donia Ast, PA  venlafaxine XR (EFFEXOR-XR) 75 MG 24 hr capsule Take 75 mg by mouth daily with breakfast.    [provider]  Vitamin D, Ergocalciferol, (DRISDOL) 50000 units CAPS capsule Take 50,000 Units by mouth once a week. 12/05/16   [provider]  XARELTO 10 MG TABS tablet Take 1 tablet (10 mg total) by mouth daily with supper. Patient taking differently: Take 10 mg by mouth daily.  09/25/19   Volanda Napoleon, MD    Allergies    Naproxen  Review of Systems   Review of Systems  Constitutional: Positive for chills and fatigue. Negative for fever.  HENT: Positive for sinus pain. Negative for congestion, rhinorrhea and sore throat.   Eyes: Negative for photophobia, discharge and redness.  Respiratory: Positive for cough. Negative for shortness of breath and wheezing.   Cardiovascular: Negative for chest pain, palpitations and leg swelling.  Gastrointestinal: Negative for abdominal pain, nausea and vomiting.  Endocrine: Positive for polyuria.  Genitourinary: Positive for frequency and urgency. Negative for decreased  urine volume, dysuria, hematuria and pelvic pain.  Musculoskeletal: Positive for myalgias. Negative for arthralgias and back pain.  Skin: Negative for rash.  Neurological: Positive for dizziness, speech difficulty and headaches. Negative for tremors, seizures, syncope, weakness, light-headedness and numbness.  Psychiatric/Behavioral: Negative for confusion.    Physical Exam Updated Vital Signs BP (!) 158/63 (BP Location: Right Arm)   Pulse 80   Temp 97.8 F (36.6 C) (Oral)   Resp 14   Ht 5\' 2"  (1.575 m)   Wt 80.7 kg   SpO2 97%   BMI 32.56 kg/m   Physical Exam Vitals and nursing note reviewed.  Constitutional:      General: She is not in acute distress.    Appearance: Normal appearance. She is well-developed. She is obese. She is not ill-appearing, toxic-appearing or diaphoretic.  HENT:     Head: Normocephalic and atraumatic.     Right Ear: There is impacted cerumen.     Left Ear: There is impacted cerumen.     Mouth/Throat:     Mouth: Mucous membranes are moist.  Eyes:     General: No visual field deficit.    Extraocular Movements: Extraocular movements intact.     Right eye: Abnormal extraocular motion present.     Left eye: Abnormal extraocular motion present.     Conjunctiva/sclera: Conjunctivae normal.     Pupils: Pupils are equal, round, and reactive to light.  Cardiovascular:     Rate and Rhythm: Normal rate and regular rhythm.     Heart sounds: Murmur heard.   Pulmonary:     Effort: Pulmonary effort is normal.     Breath sounds: Normal breath sounds.  Abdominal:     General: Bowel sounds are normal.     Palpations: Abdomen is soft.  Musculoskeletal:        General: Normal range of motion.  Skin:    General: Skin is warm and dry.     Capillary Refill: Capillary refill takes less than 2 seconds.     Coloration: Skin is not cyanotic or pale.     Findings: No erythema or rash.  Neurological:     Mental Status: She is alert.  Cranial Nerves: No cranial  nerve deficit, dysarthria or facial asymmetry.     Sensory: No sensory deficit.     Motor: No weakness.     Gait: Gait abnormal (Antalgic gait related to right knee surgery).     Deep Tendon Reflexes: Reflexes normal.  Psychiatric:        Mood and Affect: Mood normal.     ED Results / Procedures / Treatments   Labs (all labs ordered are listed, but only abnormal results are displayed) Labs Reviewed  COMPREHENSIVE METABOLIC PANEL - Abnormal; Notable for the following components:      Result Value   Total Protein 6.3 (*)    Albumin 3.4 (*)    GFR calc non Af Amer 60 (*)    All other components within normal limits  CBC WITH DIFFERENTIAL/PLATELET - Abnormal; Notable for the following components:   Hemoglobin 11.1 (*)    HCT 35.8 (*)    RDW 16.9 (*)    Lymphs Abs 5.5 (*)    All other components within normal limits  URINALYSIS, ROUTINE W REFLEX MICROSCOPIC - Abnormal; Notable for the following components:   APPearance HAZY (*)    pH 8.5 (*)    All other components within normal limits  SARS CORONAVIRUS 2 BY RT PCR (HOSPITAL ORDER, [facility])  PATHOLOGIST SMEAR REVIEW    EKG EKG Interpretation  Date/Time:  Saturday July 11 2020 12:17:27 EDT Ventricular Rate:  71 PR Interval:    QRS Duration: 83 QT Interval:  386 QTC Calculation: 420 R Axis:   -15 Text Interpretation: Sinus rhythm Abnormal R-wave progression, early transition Left ventricular hypertrophy similar to Jun 2021 Confirmed by Sherwood Gambler [phone]) on 07/11/2020 12:20:45 PM   Radiology CT Head Wo Contrast  Result Date: 07/11/2020 CLINICAL DATA:  Increased dizziness today.  History of vertigo. EXAM: CT HEAD WITHOUT CONTRAST TECHNIQUE: Contiguous axial images were obtained from the base of the skull through the vertex without intravenous contrast. COMPARISON:  Cranial MRI 11/15/2015. FINDINGS: Brain: There is no evidence of acute intracranial hemorrhage, mass lesion, brain edema or  extra-axial fluid collection. The ventricles and subarachnoid spaces are appropriately sized for age. There is no CT evidence of acute cortical infarction. Stable mild chronic small vessel ischemic changes in the periventricular white matter. Vascular: Intracranial vascular calcifications. No hyperdense vessel identified. Skull: Negative for fracture or focal lesion. Sinuses/Orbits: The visualized paranasal sinuses and mastoid air cells are clear. No acute orbital abnormalities are seen. Small extraconal lesion medially in the right orbit adjacent to the medial rectus muscle is unchanged (image 21/4). Other: None. IMPRESSION: No acute intracranial findings. Stable mild chronic small vessel ischemic changes. Electronically Signed   By: Richardean Sale M.D.   On: 07/11/2020 12:56   MR Brain W and Wo Contrast  Result Date: 07/11/2020 CLINICAL DATA:  Transient episode of whole-body weakness and numbness. Delayed speech. Third time having such an episode. EXAM: MRI HEAD WITHOUT AND WITH CONTRAST TECHNIQUE: Multiplanar, multiecho pulse sequences of the brain and surrounding structures were obtained without and with intravenous contrast. CONTRAST:  31mL GADAVIST GADOBUTROL 1 MMOL/ML IV SOLN COMPARISON:  CT head without contrast 07/11/2020. MR head without and with contrast 11/15/2015 FINDINGS: Brain: No acute infarct, hemorrhage, or mass lesion is present. No significant white matter lesions are present. The ventricles are of normal size. No significant extraaxial fluid collection is present. The internal auditory canals are within normal limits. The brainstem and cerebellum are within normal limits.  Vascular: Flow is present in the major intracranial arteries. Skull and upper cervical spine: The craniocervical junction is normal. Upper cervical spine is within normal limits. Marrow signal is unremarkable. Sinuses/Orbits: The paranasal sinuses and mastoid air cells are clear. An extra conal solid and cystic lesion is  present in the medial right orbit, medially displacing the right medial rectus muscle. Lesion measures 17 x 5 x 7 mm. Posterior aspect of the lesion enhances. The lesion has not changed in size. No additional lesions are present. IMPRESSION: 1. Stable extra conal solid and cystic lesion in the medial right orbit. 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 It appears to be vascular lesion, unchanged in size over time. Cavernous hemangioma is also considered. 2. Normal MRI appearance of the brain. Electronically Signed   By: San Morelle M.D.   On: 07/11/2020 15:57   DG Chest Portable 1 View  Result Date: 07/11/2020 CLINICAL DATA:  Cough. EXAM: PORTABLE CHEST 1 VIEW COMPARISON:  06/12/2020 FINDINGS: The heart size and mediastinal contours are within normal limits. Both lungs are clear. The visualized skeletal structures are unremarkable. IMPRESSION: No active disease. Electronically Signed   By: Marin Olp M.D.   On: 07/11/2020 12:30    Procedures Procedures (including critical care time)  Medications Ordered in ED Medications  meclizine (ANTIVERT) tablet 50 mg (50 mg Oral Given 07/11/20 1155)  gadobutrol (GADAVIST) 1 MMOL/ML injection 8 mL (8 mLs Intravenous Contrast Given 07/11/20 1513)    ED Course  I have reviewed the triage vital signs and the nursing notes.  Pertinent labs & imaging results that were available during my care of the patient were reviewed by me and considered in my medical decision making (see chart for details).  Clinical Course as of Jul 11 1829  Sat Jul 11, 2020  1357 Patient presenting with headache and dizziness since this morning. Also frequent urination. Symptoms improved on arrival. Workup overall reassuring. She reports mild dizziness after meclizine so MRI ordered. Patient's daughter  then arrived and reported that she has had a handful of episodes in the last few months where she gets severe headache and becomes less responsive with slower  speech reportedly weakness which resolves after a few minutes. MRI pending. May need neuro consult even if MRI normal based on these symptoms.   [KM]  29 MRI is unchanged from prior.  Patient is now at her baseline.  I spoke with daughter at bedside who is concerned for TIA.  She does report that this morning when she got her mom she was unable to speak, confused and could not raise her left arm as well as her right arm.  This had resolved by the time she had gotten here.  The daughter reports that this is about the third episode in the last 2 months and each episode is getting longer and longer.  They are preceded by a headache.  Will discuss with neurology.   [KM]  [phone] Spoke with neurology who agrees medicine admit and further workup for TIA vs complex migraine with possible need for eeg.    [KM]  47 Dr. Doristine Bosworth to [place]. Patient is stable with daughter at bedside.    [KM]    Clinical Course User Index [KM] Kristine Royal   MDM Rules/Calculators/A&P                          The patient appears reasonably stabilized for admission considering the current  resources, flow, and capabilities available in the ED at this time, and I doubt any other [facility] requiring further screening and/or treatment in the ED prior to admission.  Final Clinical Impression(s) / ED Diagnoses Final diagnoses:  Confusion  Dizziness    Rx / DC Orders ED Discharge Orders    None       Alveria Apley, PA-C 07/11/20 1750    Alveria Apley, PA-C 07/11/20 1830    Sherwood Gambler, MD 07/15/20 [phone]

## 2020-07-12 ENCOUNTER — Other Ambulatory Visit: Payer: Self-pay

## 2020-07-12 ENCOUNTER — Inpatient Hospital Stay (HOSPITAL_COMMUNITY): Payer: Medicare Other

## 2020-07-12 DIAGNOSIS — J4521 Mild intermittent asthma with (acute) exacerbation: Secondary | ICD-10-CM

## 2020-07-12 DIAGNOSIS — E039 Hypothyroidism, unspecified: Secondary | ICD-10-CM

## 2020-07-12 DIAGNOSIS — R410 Disorientation, unspecified: Secondary | ICD-10-CM

## 2020-07-12 DIAGNOSIS — N1831 Chronic kidney disease, stage 3a: Secondary | ICD-10-CM

## 2020-07-12 DIAGNOSIS — G459 Transient cerebral ischemic attack, unspecified: Secondary | ICD-10-CM

## 2020-07-12 DIAGNOSIS — J3089 Other allergic rhinitis: Secondary | ICD-10-CM

## 2020-07-12 DIAGNOSIS — J302 Other seasonal allergic rhinitis: Secondary | ICD-10-CM

## 2020-07-12 DIAGNOSIS — G2581 Restless legs syndrome: Secondary | ICD-10-CM

## 2020-07-12 DIAGNOSIS — R569 Unspecified convulsions: Secondary | ICD-10-CM

## 2020-07-12 DIAGNOSIS — I10 Essential (primary) hypertension: Secondary | ICD-10-CM

## 2020-07-12 DIAGNOSIS — R42 Dizziness and giddiness: Secondary | ICD-10-CM

## 2020-07-12 DIAGNOSIS — K219 Gastro-esophageal reflux disease without esophagitis: Secondary | ICD-10-CM

## 2020-07-12 LAB — ECHOCARDIOGRAM COMPLETE
Area-P 1/2: 2.83 cm2
Height: 62 in
S' Lateral: 2.7 cm
Weight: 2848 oz

## 2020-07-12 LAB — PROTIME-INR
INR: 1.7 — ABNORMAL HIGH (ref 0.8–1.2)
Prothrombin Time: 19.1 seconds — ABNORMAL HIGH (ref 11.4–15.2)

## 2020-07-12 LAB — APTT: aPTT: 33 seconds (ref 24–36)

## 2020-07-12 MED ORDER — METOCLOPRAMIDE HCL 5 MG/ML IJ SOLN
10.0000 mg | Freq: Once | INTRAMUSCULAR | Status: AC
  Administered 2020-07-12: 10 mg via INTRAVENOUS
  Filled 2020-07-12: qty 2

## 2020-07-12 MED ORDER — IOHEXOL 350 MG/ML SOLN
75.0000 mL | Freq: Once | INTRAVENOUS | Status: AC | PRN
  Administered 2020-07-12: 75 mL via INTRAVENOUS

## 2020-07-12 NOTE — Progress Notes (Signed)
  Echocardiogram 2D Echocardiogram has been performed.  [name] 07/12/2020, 3:40 PM

## 2020-07-12 NOTE — Consult Note (Signed)
Neurology Consultation Reason for Consult: Passing out episode Referring Physician: Fair, C  CC: Headache  History is obtained from: Patient  HPI: [name] is a 74 y.o. female over the past 5 weeks has had recurrent episodes of left facial paresthesia coupled with bandlike head pressure/pain.  She states that she has gotten them multiple times the past 5 weeks.  She denies any history of similar headaches in the past.  She states that it will come on on the left side, then spread to bitemporal pain that is throbbing in character, not associated with photophobia or nausea.  Over the past week, she has become vertiginous with these episodes as well.  She complains of some blurred vision with them also.  She complains of tingling/numbness of her left face that then spreads to her right face associate with these episodes as well.  Today after waking up around 5 AM and eating to the bathroom, since she started moving she felt severely vertiginous.  He states that she somehow managed to get up and go to the bathroom, but then on getting back to her bed immediately "passed out".  She then later was able to get up and make a cup of coffee, but she only has vague memories of this time.  Her brother then found her later with confusion and therefore she presented med [employer].     ROS: A 14 point ROS was performed and is negative except as noted in the HPI.   Past Medical History:  Diagnosis Date  . Allergic rhinitis   . Allergic rhinitis   . Anxiety   . Arthritis    "hands, feet, shoulders, arms" (02/22/2018)  . Bilateral pulmonary embolism ([place]) 02/21/2018  . Chronic cough   . Depression   . Dupuytren contracture    "both feet, both hands" (02/22/2018)  . DVT (deep venous thrombosis) (HCC)    bil le  . GERD (gastroesophageal reflux disease)   . Glaucoma   . Hypertension   . Hypothyroidism   . Mild persistent asthma    cougher not a wheezr gets chokes easily  . Pneumonia   .  PONV (postoperative nausea and vomiting)      Family History  Problem Relation Age of Onset  . Pneumonia Father   . Arthritis Mother      Social History:  reports that she has never smoked. She has never used smokeless tobacco. She reports previous alcohol use. She reports that she does not use drugs.   Exam: Current vital signs: BP (!) 123/64 (BP Location: Left Arm)   Pulse 82   Temp 98.2 F (36.8 C) (Oral)   Resp 18   Ht 5\' 2"  (1.575 m)   Wt 80.7 kg   SpO2 97%   BMI 32.56 kg/m  Vital signs in last 24 hours: Temp:  [97.6 F (36.4 C)-98.2 F (36.8 C)] 98.2 F (36.8 C) (07/25 0113) Pulse Rate:  [72-96] 82 (07/25 0113) Resp:  [14-20] 18 (07/25 0113) BP: (120-159)/(57-114) 123/64 (07/25 0113) SpO2:  [94 %-100 %] 97 % (07/25 0113) FiO2 (%):  [21 %] 21 % (07/24 2230) Weight:  [80.7 kg] 80.7 kg (07/24 1119)   Physical Exam  Constitutional: Appears well-developed and well-nourished.  Psych: Affect appropriate to situation Eyes: No scleral injection HENT: No OP obstrucion MSK: no joint deformities.  Cardiovascular: Normal rate and regular rhythm.  Respiratory: Effort normal, non-labored breathing GI: Soft.  No distension. There is no tenderness.  Skin: WDI  Neuro: Mental Status: Patient is awake, alert, oriented to person, place, month, year, and situation. Patient is able to give a clear and coherent history. No signs of aphasia or neglect Cranial Nerves: II: Visual Fields are full. Pupils are equal, round, and reactive to light.   III,IV, VI: EOMI without ptosis or diploplia.  V: Facial sensation is symmetric to temperature VII: Facial movement is symmetric.  VIII: hearing is intact to voice X: Uvula elevates symmetrically XI: Shoulder shrug is symmetric. XII: tongue is midline without atrophy or fasciculations.  Motor: Tone is normal. Bulk is normal. 5/5 strength was present in all four extremities.  Sensory: Sensation is symmetric to light touch and  temperature in the arms and legs. Deep Tendon Reflexes: 2+ and symmetric in the biceps and patellae.  Plantars: Toes are downgoing bilaterally.  Cerebellar: FNF  intact bilaterally   I have reviewed labs in epic and the results pertinent to this consultation are: CMP-unremarkable CBC-negative  I have reviewed the images obtained: MRI brain-negative  Impression: 74 year old female with recurrent episodes of facial paresthesia coupled with severe bilateral throbbing headache which is more recently been progressing to vertiginous episodes and today, possible lightheadedness/presyncope as well.  This description cup with a negative MRI I think makes TIA/ischemia extremely unlikely.  Overall, I think the clinical description is most consistent with complicated migraine, however it would be unusual for this to present [place] [place] novo in a 74 year old.  I would favor ruling out basilar insufficiency with CTA head and neck and get an EEG as well.   Recommendations: 1) EEG 2) CTA head and neck 3) Will give a dose of reglan, monitor for effect.     Roland Rack, MD [facility] [phone]  If 7pm- 7am, please page neurology on call as listed in [place].

## 2020-07-12 NOTE — Progress Notes (Signed)
EEG complete - results pending 

## 2020-07-12 NOTE — Progress Notes (Signed)
PROGRESS NOTE    [name]  QIO:[id number] DOB: 09/11/1946 DOA: 07/11/2020 PCP: Blair Heys, PA-C   Brief Narrative:  HPI On 07/11/2020 by Dr. Barrington Ellison [name] is a 74 y.o. 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  Patient reports presenting to ED today due to progressively worsening headache and dizziness. Reports waking up around 0500 to use the restroom and as soon as she set up reports she felt the room spinning and started to feel like there was a "vice on her head." She made it to the bathroom and ultimately to the kitchen for coffee and a bowl of cereal. Reports she does not remember what happened after that but her brother found her a couple hours later and noted her speech to be slurred with confusion. Symptoms resolved currently but does feel like her headache or the tightening on her head is starting to return. She has experienced similar symptoms for about the past 5 weeks about 3-4 times weekly but feels they are worsening. Also reports that she will have tingling and warmth on the left side of her face during episodes and that this has started to move to the right side of her face and down her body with more recent episodes. Reports chronic cough and SOB secondary to asthma. Received COVID vaccine two days ago. Also has chronic constipation and GERD. Denies fever, chills, chest pain, abdominal pain, nausea, vomiting, diarrhea, dysuria, hematuria, hematochezia, melena, trouble eating, or any other complaints.  Assessment & Plan   Headache/ Dizziness/ ?TIA -Presented with progressively worsening headache and dizziness over the past 5 weeks accompanied by confusion, speech difficulty and left-sided facial weakness, warmth and tingling -Symptoms seem to have resolved -CT head: No acute intercranial normalities. -CTA  head and neck: No emergent large vessel occlusion or acute intercranial normality. 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  Cavernous hemangioma is also considered.  Normal MRI appearance of the brain. -Echocardiogram pending -EEG: Excessive beta activity seen in the background most likely due to effect of benzodiazepine, benign EEG pattern.  No seizures or epileptiform discharges. -Neurology consulted and appreciated  Essential hypertension -Continue amlodipine  Hypothyroidism -Continue Synthroid  History of PE -Continue Xarelto  Asthma -Continue home medications  GERD -Continue PPI  Chronic pain/insomnia/mood symptoms -Continue home medications Xanax, Wellbutrin, Aricept, oxycodone, Lyrica, Requip, Effexor -Patient will need to follow-up with her PCP for medication management -Zanaflex held due to multiple medications listed above  Chronic kidney disease, stage II-IIIa -Creatinine appears to be at baseline  Diabetes mellitus, type II -hemoglobin A1c 6.9 02/22/2018 -will order A1c -Patient on any home medications prior to admission -Likely diet controlled -continue to monitor CBGs, ISS currently held   DVT Prophylaxis  Xarelto  Code Status: Full  Family Communication: None at bedside  Disposition Plan:  Status is: Inpatient  Remains inpatient appropriate because:Ongoing diagnostic testing needed not appropriate for outpatient work up and Inpatient level of care appropriate due to severity of illness   Dispo: The patient is from: Home              Anticipated d/c is to: Home              Anticipated d/c date is: 2 days  Patient currently is not medically stable to d/c.  Consultants Neurology  Procedures  EEG  Antibiotics   Anti-infectives (From admission, onward)   None      Subjective:   Cele Mote seen and examined  today.  Patient extremely sleepy this morning. Denies chest pain, shortness of breath, abdominal pain. (Falls asleep mid sentence) Objective:   Vitals:   07/12/20 0522 07/12/20 0748 07/12/20 0749 07/12/20 1159  BP: (!) 151/57  (!) 162/65 (!) 155/69  Pulse: 67  68 76  Resp: 18  16 20   Temp: 97.7 F (36.5 C)  97.8 F (36.6 C) (!) 97.5 F (36.4 C)  TempSrc: Oral  Axillary Oral  SpO2: 99% 99% 99% 95%  Weight:      Height:        Intake/Output Summary (Last 24 hours) at 07/12/2020 1303 Last data filed at 07/12/2020 1202 Gross per 24 hour  Intake 240 ml  Output 500 ml  Net -260 ml   Filed Weights   07/11/20 1119  Weight: 80.7 kg    Exam  General: Well developed, well nourished, NAD, somnolent  HEENT: NCAT, mucous membranes moist.   Cardiovascular: S1 S2 auscultated, RRR, no murmur  Respiratory: Clear to auscultation bilaterally   Abdomen: Soft, nontender, nondistended, + bowel sounds  Extremities: warm dry without cyanosis clubbing or edema  Neuro: AAOx3, nonfocal, somnolent  Data Reviewed: I have personally reviewed following labs and imaging studies  CBC: Recent Labs  Lab 07/11/20 1200  WBC 9.4  NEUTROABS 3.0  HGB 11.1*  HCT 35.8*  MCV 86.3  PLT 824   Basic Metabolic Panel: Recent Labs  Lab 07/11/20 1200  NA 139  K 3.9  CL 103  CO2 24  GLUCOSE 96  BUN 14  CREATININE 0.95  CALCIUM 9.3   GFR: Estimated Creatinine Clearance: 52.6 mL/min (by C-G formula based on SCr of 0.95 mg/dL). Liver Function Tests: Recent Labs  Lab 07/11/20 1200  AST 27  ALT 15  ALKPHOS 109  BILITOT 0.5  PROT 6.3*  ALBUMIN 3.4*   No results for input(s): LIPASE, AMYLASE in the last 168 hours. No results for input(s): AMMONIA in the last 168 hours. Coagulation Profile: Recent Labs  Lab 07/12/20 0453  INR 1.7*   Cardiac Enzymes: No results for input(s): CKTOTAL, CKMB, CKMBINDEX, TROPONINI in the last 168 hours. BNP (last 3 results) No results for input(s):  PROBNP in the last 8760 hours. HbA1C: No results for input(s): HGBA1C in the last 72 hours. CBG: No results for input(s): GLUCAP in the last 168 hours. Lipid Profile: No results for input(s): CHOL, HDL, LDLCALC, TRIG, CHOLHDL, LDLDIRECT in the last 72 hours. Thyroid Function Tests: No results for input(s): TSH, T4TOTAL, FREET4, T3FREE, THYROIDAB in the last 72 hours. Anemia Panel: No results for input(s): VITAMINB12, FOLATE, FERRITIN, TIBC, IRON, RETICCTPCT in the last 72 hours. Urine analysis:    Component Value Date/Time   COLORURINE YELLOW 07/11/2020 1138   APPEARANCEUR HAZY (A) 07/11/2020 1138   LABSPEC 1.015 07/11/2020 1138   PHURINE 8.5 (H) 07/11/2020 1138   GLUCOSEU NEGATIVE 07/11/2020 1138   HGBUR NEGATIVE 07/11/2020 1138   [place] 07/11/2020 1138   KETONESUR NEGATIVE 07/11/2020 1138   PROTEINUR NEGATIVE 07/11/2020 1138   NITRITE NEGATIVE 07/11/2020 1138   LEUKOCYTESUR NEGATIVE 07/11/2020 1138   Sepsis Labs: @LABRCNTIP (procalcitonin:4,lacticidven:4)  ) Recent Results (from the past 240 hour(s))  SARS Coronavirus 2 by RT PCR (hospital order, performed in [facility] hospital lab) Nasopharyngeal Nasopharyngeal Swab  Status: None   Collection Time: 07/11/20 12:00 PM   Specimen: Nasopharyngeal Swab  Result Value Ref Range Status   SARS Coronavirus 2 NEGATIVE NEGATIVE Final    Comment: (NOTE) SARS-CoV-2 target nucleic acids are NOT DETECTED.  The SARS-CoV-2 RNA is generally detectable in upper and lower respiratory specimens during the acute phase of infection. The lowest concentration of SARS-CoV-2 viral copies this assay can detect is 250 copies / mL. A negative result does not preclude SARS-CoV-2 infection and should not be used as the sole basis for treatment or other patient management decisions.  A negative result may occur with improper specimen collection / handling, submission of specimen other than nasopharyngeal swab, presence of viral  mutation(s) within the areas targeted by this assay, and inadequate number of viral copies (<250 copies / mL). A negative result must be combined with clinical observations, patient history, and epidemiological information.  Fact Sheet for Patients:   StrictlyIdeas.no  Fact Sheet for Healthcare Providers: BankingDealers.co.za  This test is not yet approved or  cleared by the [place] FDA and has been authorized for detection and/or diagnosis of SARS-CoV-2 by FDA under an Emergency Use Authorization (EUA).  This EUA will remain in effect (meaning this test can be used) for the duration of the COVID-19 declaration under Section 564(b)(1) of the Act, 21 U.S.C. section 360bbb-3(b)(1), unless the authorization is terminated or revoked sooner.  Performed at [facility], [address] [place], [place] [address]       Radiology Studies: CT ANGIO HEAD W OR WO CONTRAST  Result Date: 07/12/2020 CLINICAL DATA:  Dizziness and facial numbness EXAM: CT ANGIOGRAPHY HEAD AND NECK TECHNIQUE: Multidetector CT imaging of the head and neck was performed using the standard protocol during bolus administration of intravenous contrast. Multiplanar CT image reconstructions and MIPs were obtained to evaluate the vascular anatomy. Carotid stenosis measurements (when applicable) are obtained utilizing NASCET criteria, using the distal internal carotid diameter as the denominator. CONTRAST:  30mL OMNIPAQUE IOHEXOL 350 MG/ML SOLN COMPARISON:  None. FINDINGS: CT HEAD FINDINGS Brain: There is no mass, hemorrhage or extra-axial collection. The size and configuration of the ventricles and extra-axial CSF spaces are normal. There is no acute or chronic infarction. The brain parenchyma is normal. Skull: The visualized skull base, calvarium and extracranial soft tissues are normal. Sinuses/Orbits: No fluid levels or advanced mucosal thickening of the visualized  paranasal sinuses. No mastoid or middle ear effusion. The orbits are normal. CTA NECK FINDINGS SKELETON: There is no bony spinal canal stenosis. No lytic or blastic lesion. OTHER NECK: Normal pharynx, larynx and major salivary glands. No cervical lymphadenopathy. Unremarkable thyroid gland. UPPER CHEST: No pneumothorax or pleural effusion. No nodules or masses. AORTIC ARCH: There is no calcific atherosclerosis of the aortic arch. There is no aneurysm, dissection or hemodynamically significant stenosis of the visualized portion of the aorta. Conventional 3 vessel aortic branching pattern. The visualized proximal subclavian arteries are widely patent. RIGHT CAROTID SYSTEM: Normal without aneurysm, dissection or stenosis. LEFT CAROTID SYSTEM: Normal without aneurysm, dissection or stenosis. VERTEBRAL ARTERIES: Left dominant configuration. Both origins are clearly patent. There is no dissection, occlusion or flow-limiting stenosis to the skull base (V1-V3 segments). CTA HEAD FINDINGS POSTERIOR CIRCULATION: --Vertebral arteries: Moderate stenosis of the left V4 segment due to calcific atherosclerosis. --Inferior cerebellar arteries: Normal. --Basilar artery: Normal. --Superior cerebellar arteries: Normal. --Posterior cerebral arteries (PCA): Normal. ANTERIOR CIRCULATION: --Intracranial internal carotid arteries: Atherosclerotic calcification of the internal carotid arteries at the skull  base without hemodynamically significant stenosis. --Anterior cerebral arteries (ACA): Normal. Both A1 segments are present. Patent anterior communicating artery (a-comm). --Middle cerebral arteries (MCA): Normal. VENOUS SINUSES: As permitted by contrast timing, patent. ANATOMIC VARIANTS: None Review of the MIP images confirms the above findings. IMPRESSION: 1. No emergent large vessel occlusion or acute intracranial abnormality. 2. 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 3. Aortic  Atherosclerosis (ICD10-I70.0). Electronically Signed   By: Ulyses Jarred M.D.   On: 07/12/2020 04:00   CT Head Wo Contrast  Result Date: 07/11/2020 CLINICAL DATA:  Increased dizziness today.  History of vertigo. EXAM: CT HEAD WITHOUT CONTRAST TECHNIQUE: Contiguous axial images were obtained from the base of the skull through the vertex without intravenous contrast. COMPARISON:  Cranial MRI 11/15/2015. FINDINGS: Brain: There is no evidence of acute intracranial hemorrhage, mass lesion, brain edema or extra-axial fluid collection. The ventricles and subarachnoid spaces are appropriately sized for age. There is no CT evidence of acute cortical infarction. Stable mild chronic small vessel ischemic changes in the periventricular white matter. Vascular: Intracranial vascular calcifications. No hyperdense vessel identified. Skull: Negative for fracture or focal lesion. Sinuses/Orbits: The visualized paranasal sinuses and mastoid air cells are clear. No acute orbital abnormalities are seen. Small extraconal lesion medially in the right orbit adjacent to the medial rectus muscle is unchanged (image 21/4). Other: None. IMPRESSION: No acute intracranial findings. Stable mild chronic small vessel ischemic changes. Electronically Signed   By: Richardean Sale M.D.   On: 07/11/2020 12:56   CT ANGIO NECK W OR WO CONTRAST  Result Date: 07/12/2020 CLINICAL DATA:  Dizziness and facial numbness EXAM: CT ANGIOGRAPHY HEAD AND NECK TECHNIQUE: Multidetector CT imaging of the head and neck was performed using the standard protocol during bolus administration of intravenous contrast. Multiplanar CT image reconstructions and MIPs were obtained to evaluate the vascular anatomy. Carotid stenosis measurements (when applicable) are obtained utilizing NASCET criteria, using the distal internal carotid diameter as the denominator. CONTRAST:  95mL OMNIPAQUE IOHEXOL 350 MG/ML SOLN COMPARISON:  None. FINDINGS: CT HEAD FINDINGS Brain: There is  no mass, hemorrhage or extra-axial collection. The size and configuration of the ventricles and extra-axial CSF spaces are normal. There is no acute or chronic infarction. The brain parenchyma is normal. Skull: The visualized skull base, calvarium and extracranial soft tissues are normal. Sinuses/Orbits: No fluid levels or advanced mucosal thickening of the visualized paranasal sinuses. No mastoid or middle ear effusion. The orbits are normal. CTA NECK FINDINGS SKELETON: There is no bony spinal canal stenosis. No lytic or blastic lesion. OTHER NECK: Normal pharynx, larynx and major salivary glands. No cervical lymphadenopathy. Unremarkable thyroid gland. UPPER CHEST: No pneumothorax or pleural effusion. No nodules or masses. AORTIC ARCH: There is no calcific atherosclerosis of the aortic arch. There is no aneurysm, dissection or hemodynamically significant stenosis of the visualized portion of the aorta. Conventional 3 vessel aortic branching pattern. The visualized proximal subclavian arteries are widely patent. RIGHT CAROTID SYSTEM: Normal without aneurysm, dissection or stenosis. LEFT CAROTID SYSTEM: Normal without aneurysm, dissection or stenosis. VERTEBRAL ARTERIES: Left dominant configuration. Both origins are clearly patent. There is no dissection, occlusion or flow-limiting stenosis to the skull base (V1-V3 segments). CTA HEAD FINDINGS POSTERIOR CIRCULATION: --Vertebral arteries: Moderate stenosis of the left V4 segment due to calcific atherosclerosis. --Inferior cerebellar arteries: Normal. --Basilar artery: Normal. --Superior cerebellar arteries: Normal. --Posterior cerebral arteries (PCA): Normal. ANTERIOR CIRCULATION: --Intracranial internal carotid arteries: Atherosclerotic calcification of the internal carotid arteries  at the skull base without hemodynamically significant stenosis. --Anterior cerebral arteries (ACA): Normal. Both A1 segments are present. Patent anterior communicating artery (a-comm).  --Middle cerebral arteries (MCA): Normal. VENOUS SINUSES: As permitted by contrast timing, patent. ANATOMIC VARIANTS: None Review of the MIP images confirms the above findings. IMPRESSION: 1. No emergent large vessel occlusion or acute intracranial abnormality. 2. 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 3. Aortic Atherosclerosis (ICD10-I70.0). Electronically Signed   By: Ulyses Jarred M.D.   On: 07/12/2020 04:00   MR Brain W and Wo Contrast  Result Date: 07/11/2020 CLINICAL DATA:  Transient episode of whole-body weakness and numbness. Delayed speech. Third time having such an episode. EXAM: MRI HEAD WITHOUT AND WITH CONTRAST TECHNIQUE: Multiplanar, multiecho pulse sequences of the brain and surrounding structures were obtained without and with intravenous contrast. CONTRAST:  38mL GADAVIST GADOBUTROL 1 MMOL/ML IV SOLN COMPARISON:  CT head without contrast 07/11/2020. MR head without and with contrast 11/15/2015 FINDINGS: Brain: No acute infarct, hemorrhage, or mass lesion is present. No significant white matter lesions are present. The ventricles are of normal size. No significant extraaxial fluid collection is present. The internal auditory canals are within normal limits. The brainstem and cerebellum are within normal limits. Vascular: Flow is present in the major intracranial arteries. Skull and upper cervical spine: The craniocervical junction is normal. Upper cervical spine is within normal limits. Marrow signal is unremarkable. Sinuses/Orbits: The paranasal sinuses and mastoid air cells are clear. An extra conal solid and cystic lesion is present in the medial right orbit, medially displacing the right medial rectus muscle. Lesion measures 17 x 5 x 7 mm. Posterior aspect of the lesion enhances. The lesion has not changed in size. No additional lesions are present. IMPRESSION: 1. Stable extra conal solid and cystic lesion in the medial right orbit. 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 It appears to be vascular lesion, unchanged in size over time. Cavernous hemangioma is also considered. 2. Normal MRI appearance of the brain. Electronically Signed   By: San Morelle M.D.   On: 07/11/2020 15:57   DG Chest Portable 1 View  Result Date: 07/11/2020 CLINICAL DATA:  Cough. EXAM: PORTABLE CHEST 1 VIEW COMPARISON:  06/12/2020 FINDINGS: The heart size and mediastinal contours are within normal limits. Both lungs are clear. The visualized skeletal structures are unremarkable. IMPRESSION: No active disease. Electronically Signed   By: Marin Olp M.D.   On: 07/11/2020 12:30   EEG adult  Result Date: 07/12/2020 Lora Havens, MD     07/12/2020 11:32 AM Patient Name: DENICIA PAGLIARULO MRN: [id number] Epilepsy Attending: Lora Havens Referring Physician/Provider: Dr Roland Rack Date: 07/12/2020 Duration: 25.03 mins Patient history: 74 year old female with recurrent episodes of facial paresthesia coupled with severe bilateral throbbing headache which is more recently been progressing to vertiginous episodes and today, possible lightheadedness/presyncope as well. EEG to evaluate for seizure. Level of alertness: Awake AEDs during EEG study: Xanax Technical aspects: This EEG study was done with scalp electrodes positioned according to the 10-20 International system of electrode placement. Electrical activity was acquired at a sampling rate of 500Hz  and reviewed with a high frequency filter of 70Hz  and a low frequency filter of 1Hz . EEG data were recorded continuously and digitally stored. Description: No posterior dominant rhythm was seen. There is an excessive amount of 15 to 18 Hz beta activity distributed symmetrically and diffusely.  Physiology photic driving was seen during photic stimulation.  Hyperventilation  was not performed.   ABNORMALITY -Excessive beta, generalized IMPRESSION: This study is within normal limits. The excessive beta  activity seen in the background is most likely due to the effect of benzodiazepine and is a benign EEG pattern.  No seizures or epileptiform discharges were seen throughout the recording. Priyanka Barbra Sarks     Scheduled Meds: . ALPRAZolam  0.5 mg Oral QHS  . amLODipine  10 mg Oral Daily  . buPROPion  150 mg Oral Daily  . donepezil  10 mg Oral QHS  . fluticasone  1 spray Each Nare Daily  . fluticasone furoate-vilanterol  1 puff Inhalation Daily  . latanoprost  1 drop Both Eyes QHS  . levothyroxine  50 mcg Oral Q0600  . montelukast  10 mg Oral QHS  . pantoprazole  40 mg Oral Daily  . polyethylene glycol  17 g Oral Daily  . pregabalin  75 mg Oral BID  . rivaroxaban  10 mg Oral Daily  . rOPINIRole  2 mg Oral QHS  . senna  8.6 mg Oral QHS  . venlafaxine XR  150 mg Oral Q breakfast   Continuous Infusions:   LOS: 1 day   Time Spent in minutes   45 minutes  Dois Juarbe D.O. on 07/12/2020 at 1:03 PM  Between 7am to 7pm - Please see pager noted on [email or web address]  After 7pm go to [email or web address]  And look for the night coverage person covering for me after hours  [facility] Office  [phone]

## 2020-07-12 NOTE — Evaluation (Signed)
Physical Therapy Evaluation Patient Details Name: [name] MRN: [id number] DOB: 10-12-1946 Today's Date: 07/12/2020   History of Present Illness  74 y.o. 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 Patient reports she has been having symptoms off/on x 2 months--headache that feels like head is in a vice; garbled speech, and is amnestic of events. CT head and MRI brain no acute findings. EEG negative. CTA no LVO, moderate stenosis Lt vertebral artery.   Clinical Impression   Pt admitted with above symptoms. She is also recently s/p Rt TKR and has been attending OPPT for strengthening and ROM. Thus far, there is no explanation/diagnosis related to her severe headaches and dizziness. She did not experience any of these symptoms during PT evaluation. Educated to continue as many TKR exercises as she can to maintain her gains since surgery. Pt currently with functional limitations due to the deficits listed below (see PT Problem List). Pt will benefit from skilled PT to increase their independence and safety with mobility to allow discharge to the venue listed below.       Follow Up Recommendations Outpatient PT ( resume for post-TKR)    Equipment Recommendations  None recommended by PT    Recommendations for Other Services       Precautions / Restrictions Precautions Precautions: Fall Precaution Comments: pt reports "spells" most often happen at night      Mobility  Bed Mobility Overal bed mobility: Modified Independent             General bed mobility comments: incr time to manipulate linens and untangle herslef  Transfers Overall transfer level: Needs assistance Equipment used: Rolling walker (2 wheeled) Transfers: Sit to/from Stand Sit to Stand: Min guard         General transfer comment: from EOB  and from standard toilet without physical assist; guarding due to repeated "spells" of confusion  Ambulation/Gait Ambulation/Gait assistance: Min guard Gait Distance (Feet): 90 Feet Assistive device: Rolling walker (2 wheeled) Gait Pattern/deviations: Step-through pattern;Decreased stride length     General Gait Details: pt requested RW for safety, despite stating she does not use it at home  Stairs            Wheelchair Mobility    Modified Rankin (Stroke Patients Only)       Balance Overall balance assessment: Modified Independent                                           Pertinent Vitals/Pain Pain Assessment: No/denies pain    Home Living Family/patient expects to be discharged to:: Private residence   Available Help at Discharge: Family Type of Home: House Home Access: Stairs to enter   Technical brewer of Steps: 1 Home Layout: One level Home Equipment: Environmental consultant - 2 wheels;Bedside commode;Cane - single point      Prior Function Level of Independence: Independent with assistive device(s)         Comments: mostly not using RW anymore, but occasionally cane     Hand Dominance        Extremity/Trunk Assessment   Upper Extremity Assessment Upper Extremity Assessment: Overall WFL for tasks assessed    Lower Extremity Assessment Lower Extremity Assessment: RLE deficits/detail RLE Deficits / Details:  noted healing TKR incision; able to flex knee to at least 90 degrees; moves leg on/off bed independently    Cervical / Trunk Assessment Cervical / Trunk Assessment: Other exceptions Cervical / Trunk Exceptions: overweight  Communication   Communication: No difficulties  Cognition Arousal/Alertness: Awake/alert Behavior During Therapy: Restless Overall Cognitive Status: Within Functional Limits for tasks assessed                                        General Comments General comments (skin integrity, edema,  etc.): Family present and provides details re: her "spells" as pt does not recall anything from them    Exercises Total Joint Exercises Ankle Circles/Pumps: AROM;10 reps Quad Sets: AROM;Right;5 reps Heel Slides: AROM;Right;5 reps Straight Leg Raises: AROM;Right Long Arc Quad: AROM;Right;5 reps Knee Flexion: AROM;Right (pt hanging RLE over side of bed to stretch hip and knee)   Assessment/Plan    PT Assessment Patient needs continued PT services  PT Problem List Decreased strength;Decreased range of motion;Decreased mobility;Obesity       PT Treatment Interventions DME instruction;Gait training;Functional mobility training;Therapeutic activities;Therapeutic exercise;Patient/family education    PT Goals (Current goals can be found in the Care Plan section)  Acute Rehab PT Goals Patient Stated Goal: find out what is causing her spells/headaches PT Goal Formulation: With patient Time For Goal Achievement: 07/26/20 Potential to Achieve Goals: Good    Frequency Min 3X/week   Barriers to discharge        Co-evaluation               AM-PAC PT "6 Clicks" Mobility  Outcome Measure Help needed turning from your back to your side while in a flat bed without using bedrails?: None Help needed moving from lying on your back to sitting on the side of a flat bed without using bedrails?: None Help needed moving to and from a bed to a chair (including a wheelchair)?: None Help needed standing up from a chair using your arms (e.g., wheelchair or bedside chair)?: None Help needed to walk in hospital room?: None Help needed climbing 3-5 steps with a railing? : None 6 Click Score: 24    End of Session Equipment Utilized During Treatment: Gait belt Activity Tolerance: Patient tolerated treatment well Patient left: in bed;with call bell/phone within reach;with bed alarm set;with family/visitor present   PT Visit Diagnosis: Other abnormalities of gait and mobility (R26.89)    Time:  3235-5732 PT Time Calculation (min) (ACUTE ONLY): 25 min   Charges:   PT Evaluation $PT Eval Low Complexity: 1 Low PT Treatments $Therapeutic Exercise: 8-22 mins         Arby Barrette, PT Pager [phone]   Rexanne Mano 07/12/2020, 1:39 PM

## 2020-07-12 NOTE — Procedures (Signed)
Patient Name: [name]  MRN: [id number]  Epilepsy Attending: Lora Havens  Referring Physician/Provider: Dr Roland Rack  Date: 07/12/2020 Duration: 25.03 mins  Patient history: 74 year old female with recurrent episodes of facial paresthesia coupled with severe bilateral throbbing headache which is more recently been progressing to vertiginous episodes and today, possible lightheadedness/presyncope as well. EEG to evaluate for seizure.   Level of alertness: Awake  AEDs during EEG study: Xanax  Technical aspects: This EEG study was done with scalp electrodes positioned according to the 10-20 International system of electrode placement. Electrical activity was acquired at a sampling rate of 500Hz  and reviewed with a high frequency filter of 70Hz  and a low frequency filter of 1Hz . EEG data were recorded continuously and digitally stored.   Description: No posterior dominant rhythm was seen. There is an excessive amount of 15 to 18 Hz beta activity distributed symmetrically and diffusely.  Physiology photic driving was seen during photic stimulation.  Hyperventilation was not performed.     ABNORMALITY -Excessive beta, generalized  IMPRESSION: This study is within normal limits. The excessive beta activity seen in the background is most likely due to the effect of benzodiazepine and is a benign EEG pattern.  No seizures or epileptiform discharges were seen throughout the recording.  Keitra Carusone Barbra Sarks

## 2020-07-13 DIAGNOSIS — N183 Chronic kidney disease, stage 3 unspecified: Secondary | ICD-10-CM

## 2020-07-13 DIAGNOSIS — J4520 Mild intermittent asthma, uncomplicated: Secondary | ICD-10-CM

## 2020-07-13 DIAGNOSIS — E1122 Type 2 diabetes mellitus with diabetic chronic kidney disease: Secondary | ICD-10-CM

## 2020-07-13 LAB — TSH: TSH: 1.616 u[IU]/mL (ref 0.350–4.500)

## 2020-07-13 LAB — VITAMIN B12: Vitamin B-12: 341 pg/mL (ref 180–914)

## 2020-07-13 LAB — FOLATE: Folate: 15.2 ng/mL (ref >5.9)

## 2020-07-13 LAB — PATHOLOGIST SMEAR REVIEW: Path Review: REACTIVE

## 2020-07-13 NOTE — Progress Notes (Signed)
Called daughter to come get patient.   1812: Pt. And daughter given dc instructions, verbalized understanding. Discharged via wheelchair.

## 2020-07-13 NOTE — Discharge Instructions (Signed)
Information on my medicine - XARELTO (Rivaroxaban)  This medication education was reviewed with me or my healthcare representative as part of my discharge preparation.    Why was Xarelto prescribed for you? Xarelto was prescribed for you to reduce the risk of a blood clot forming.   What do you need to know about xarelto ? Take your Xarelto ONCE DAILY at the same time every day with your evening meal. If you have difficulty swallowing the tablet whole, you may crush it and mix in applesauce just prior to taking your dose.  Take Xarelto exactly as prescribed by your doctor and DO NOT stop taking Xarelto without talking to the doctor who prescribed the medication.  Stopping without other stroke prevention medication to take the place of Xarelto may increase your risk of developing a clot..  Refill your prescription before you run out.  After discharge, you should have regular check-up appointments with your healthcare provider that is prescribing your Xarelto.  In the future your dose may need to be changed if your kidney function or weight changes by a significant amount.  What do you do if you miss a dose? If you are taking Xarelto ONCE DAILY and you miss a dose, take it as soon as you remember on the same day then continue your regularly scheduled once daily regimen the next day. Do not take two doses of Xarelto at the same time or on the same day.   Important Safety Information A possible side effect of Xarelto is bleeding. You should call your healthcare provider right away if you experience any of the following: ? Bleeding from an injury or your nose that does not stop. ? Unusual colored urine (red or dark brown) or unusual colored stools (red or black). ? Unusual bruising for unknown reasons. ? A serious fall or if you hit your head (even if there is no bleeding).  Some medicines may interact with Xarelto and might increase your risk of bleeding while on Xarelto. To help  avoid this, consult your healthcare provider or pharmacist prior to using any new prescription or non-prescription medications, including herbals, vitamins, non-steroidal anti-inflammatory drugs (NSAIDs) and supplements.  This website has more information on Xarelto: [email or web address]

## 2020-07-13 NOTE — Discharge Summary (Signed)
Physician Discharge Summary  [name]:[id number] DOB: 1946/08/09 DOA: 07/11/2020  PCP: Anita Heys, PA-C  Admit date: 07/11/2020 Discharge date: 07/13/2020  Time spent: 45 minutes  Recommendations for Outpatient Follow-up:  Patient will be discharged to home with outpatient physical therapy.  Patient will need to follow up with primary care provider within one week of discharge.  Follow up with neurology. Patient should continue medications as prescribed.  Patient should follow a heart healthy/carb modified diet.    Discharge Diagnoses:  Headache/ Dizziness/ ?TIA Essential hypertension Dementia Hypothyroidism History of PE Asthma GERD Chronic pain/insomnia/mood symptoms Chronic kidney disease, stage II-IIIa Diabetes mellitus, type II  Discharge Condition: stable  Diet recommendation: heart healthy/carb modified   Filed Weights   07/11/20 1119  Weight: 80.7 kg    History of present illness:  On 07/11/2020 by [name] a 74 y.o.femalewith PMH ofHTN, hypothyroidism, PE's on Xarelto, Asthma, CKD, T2DM, RLS, insomnia and chronic pain who presented to [facility] for dizziness and headache and transferred to [facility] for TIA workup.  Patient reports presenting to ED today due to progressively worsening headache and dizziness. Reports waking up around 0500 to use the restroom and as soon as she set up reports she felt the room spinning and started to feel like there was a "vice on her head." She made it to the bathroom and ultimately to the kitchen for coffee and a bowl of cereal. Reports she does not remember what happened after that but her brother found her a couple hours later and noted her speech to be slurred with confusion. Symptoms resolved currently but does feel like her headache or the tightening on her head is starting to return. She has experienced similar symptoms for about the past 5 weeks about 3-4 times weekly but feels they are worsening.  Also reports that she will have tingling and warmth on the left side of her face during episodes and that this has started to move to the right side of her face and down her body with more recent episodes. Reports chronic cough and SOB secondary to asthma. Received COVID vaccine two days ago. Also has chronic constipation and GERD.Denies fever, chills, chest pain, abdominal pain, nausea, vomiting, diarrhea, dysuria, hematuria, hematochezia, melena, trouble eating, or any other complaints.  Hospital Course:  Headache/ Dizziness/ ?TIA -Presented with progressively worsening headache and dizziness over the past 5 weeks accompanied by confusion, speech difficulty and left-sided facial weakness, warmth and tingling -Symptoms seem to have resolved -CT head: No acute intercranial normalities. -CTA head and neck: No emergent large vessel occlusion or acute intercranial normality. 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  Cavernous hemangioma is also considered.  Normal MRI appearance of the brain. -Echocardiogram EF 60 to 20%, grade 1 diastolic dysfunction -EEG: Excessive beta activity seen in the background most likely due to effect of benzodiazepine, benign EEG pattern.  No seizures or epileptiform discharges. -Neurology consulted and appreciated- Discussed with [name], no further inpatient work-up needed, recommended patient follow-up with outpatient neurology -Checked TSH, B12, folate- all WNL -Discussed with patient and daughter- the possibility that this may be due to dementia as well as medication side effects, along with headache. Strongly advise neurology and psychiatry follow up on discharge  Dementia -runs in the family -patient has been forgetful per daughter -currently on Aricept  Essential hypertension -Continue amlodipine  Hypothyroidism -Continue  Synthroid  History of PE -Continue Xarelto  Asthma -Continue home medications  GERD -Continue PPI  Chronic pain/insomnia/mood symptoms -Continue home medications Xanax, Wellbutrin, Aricept, oxycodone, Lyrica, Requip, Effexor -Patient will need to follow-up with her PCP for medication management -Zanaflex held due to multiple medications listed above  Chronic kidney disease, stage II-IIIa -Creatinine appears to be at baseline  Diabetes mellitus, type II -hemoglobin A1c 6.9 02/22/2018 -will order A1c -Patient on any home medications prior to admission -Likely diet controlled -continue to monitor CBGs, ISS currently held  Procedures: EEG Echocardiogram  Consultations: Neurology  Discharge Exam: Vitals:   07/13/20 0828 07/13/20 1328  BP: (!) 157/62 (!) 164/73  Pulse: 75 90  Resp: 20 18  Temp: 97.7 F (36.5 C) 98.1 F (36.7 C)  SpO2: 98% 99%     General: Well developed, well nourished, NAD, appears stated age  HEENT: NCAT, mucous membranes moist.  Cardiovascular: S1 S2 auscultated, RRR  Respiratory: Clear to auscultation bilaterally  Abdomen: Soft, nontender, nondistended, + bowel sounds  Extremities: warm dry without cyanosis clubbing or edema  Neuro: AAOx3, nonfocal  Psych: Anxious, tearful  Discharge Instructions Discharge Instructions    Ambulatory referral to Neurology   Complete by: As directed    An appointment is requested in approximately: 1-2 weeks   Ambulatory referral to Physical Therapy   Complete by: As directed    Discharge instructions   Complete by: As directed    Patient will be discharged to home with outpatient physical therapy.  Patient will need to follow up with primary care provider within one week of discharge.  Follow up with neurology. Patient should continue medications as prescribed.  Patient should follow a heart healthy/carb modified diet.     Allergies as of 07/13/2020      Reactions   Naproxen Shortness Of  Breath, Itching, Swelling      Medication List    STOP taking these medications   oxyCODONE 5 MG immediate release tablet Commonly known as: Oxy IR/ROXICODONE   tiZANidine 4 MG tablet Commonly known as: ZANAFLEX     TAKE these medications   Acetaminophen 8 Hour 650 MG CR tablet Generic drug: acetaminophen Take 650 mg by mouth every 8 (eight) hours as needed for pain.   Advair Diskus 250-50 MCG/DOSE Aepb Generic drug: Fluticasone-Salmeterol Inhale 3 puffs into the lungs daily.   albuterol 108 (90 Base) MCG/ACT inhaler Commonly known as: ProAir HFA Inhale 2 puffs into the lungs every 4 (four) hours as needed for wheezing or shortness of breath.   albuterol (2.5 MG/3ML) 0.083% nebulizer solution Commonly known as: PROVENTIL Take 3 mLs (2.5 mg total) by nebulization every 6 (six) hours as needed for wheezing or shortness of breath.   ALPRAZolam 0.5 MG tablet Commonly known as: XANAX Take 0.5 mg by mouth at bedtime.   amLODipine 10 MG tablet Commonly known as: NORVASC Take 10 mg by mouth daily.   aspirin EC 81 MG tablet Take 81 mg by mouth daily. Swallow whole.   benzonatate 100 MG capsule Commonly known as: Tessalon Perles Take 2 capsules (200 mg total) by mouth 2 (two) times daily as needed for cough.   budesonide 0.25 MG/2ML nebulizer solution Commonly known as: Pulmicort Take 2 mLs (0.25 mg total) by nebulization 2 (two) times daily. Dx: J45.20   buPROPion 150 MG 24 hr tablet Commonly known as: WELLBUTRIN XL Take 150 mg by mouth daily.   co-enzyme Q-10 30 MG capsule Take 30 mg by mouth 3 (three) times  daily. Gummies   donepezil 5 MG tablet Commonly known as: ARICEPT Take 5 mg by mouth at bedtime.   fluticasone 50 MCG/ACT nasal spray Commonly known as: FLONASE Place 1 spray into both nostrils daily.   latanoprost 0.005 % ophthalmic solution Commonly known as: XALATAN Place 1 drop into both eyes at bedtime.   levocetirizine 5 MG tablet Commonly known  as: XYZAL Take 5 mg by mouth every evening.   levothyroxine 50 MCG tablet Commonly known as: SYNTHROID Take 50 mcg by mouth daily before breakfast.   montelukast 10 MG tablet Commonly known as: SINGULAIR Take 10 mg by mouth at bedtime.   pantoprazole 40 MG tablet Commonly known as: PROTONIX Take 40 mg by mouth daily.   pregabalin 75 MG capsule Commonly known as: LYRICA Take 75 mg by mouth 2 (two) times daily.   PREVAGEN EXTRA STRENGTH PO Take 1 tablet by mouth daily.   rOPINIRole 2 MG tablet Commonly known as: REQUIP Take 2 mg by mouth at bedtime.   senna 8.6 MG Tabs tablet Commonly known as: SENOKOT Take 8.6 mg by mouth at bedtime.   venlafaxine XR 75 MG 24 hr capsule Commonly known as: EFFEXOR-XR Take 150 mg by mouth daily with breakfast.   Vitamin D (Ergocalciferol) 1.25 MG (50000 UNIT) Caps capsule Commonly known as: DRISDOL Take 50,000 Units by mouth once a week.   Xarelto 10 MG Tabs tablet Generic drug: rivaroxaban Take 1 tablet (10 mg total) by mouth daily with supper. What changed: See the new instructions.      Allergies  Allergen Reactions  . Naproxen Shortness Of Breath, Itching and Swelling    Follow-up Information    [place] [facility] Follow up on 07/22/2020.   Why: Your appt is with Elyn Aquas PA. Your appt time is 9:30 am. Please arrive early and bring a picture ID, current medications and insurance card.  Contact information: [address], [place], [place] [address]  [phone]       [facility] Follow up.   Why: Call [facility] to see about your next appt.  Contact information: [address] [place], [place] [address]  [phone]               The results of significant diagnostics from this hospitalization (including imaging, microbiology, ancillary and laboratory) are listed below for reference.    Significant Diagnostic Studies: CT ANGIO HEAD W OR WO CONTRAST  Result Date: 07/12/2020 CLINICAL  DATA:  Dizziness and facial numbness EXAM: CT ANGIOGRAPHY HEAD AND NECK TECHNIQUE: Multidetector CT imaging of the head and neck was performed using the standard protocol during bolus administration of intravenous contrast. Multiplanar CT image reconstructions and MIPs were obtained to evaluate the vascular anatomy. Carotid stenosis measurements (when applicable) are obtained utilizing NASCET criteria, using the distal internal carotid diameter as the denominator. CONTRAST:  86mL OMNIPAQUE IOHEXOL 350 MG/ML SOLN COMPARISON:  None. FINDINGS: CT HEAD FINDINGS Brain: There is no mass, hemorrhage or extra-axial collection. The size and configuration of the ventricles and extra-axial CSF spaces are normal. There is no acute or chronic infarction. The brain parenchyma is normal. Skull: The visualized skull base, calvarium and extracranial soft tissues are normal. Sinuses/Orbits: No fluid levels or advanced mucosal thickening of the visualized paranasal sinuses. No mastoid or middle ear effusion. The orbits are normal. CTA NECK FINDINGS SKELETON: There is no bony spinal canal stenosis. No lytic or blastic lesion. OTHER NECK: Normal pharynx, larynx and major salivary glands. No  cervical lymphadenopathy. Unremarkable thyroid gland. UPPER CHEST: No pneumothorax or pleural effusion. No nodules or masses. AORTIC ARCH: There is no calcific atherosclerosis of the aortic arch. There is no aneurysm, dissection or hemodynamically significant stenosis of the visualized portion of the aorta. Conventional 3 vessel aortic branching pattern. The visualized proximal subclavian arteries are widely patent. RIGHT CAROTID SYSTEM: Normal without aneurysm, dissection or stenosis. LEFT CAROTID SYSTEM: Normal without aneurysm, dissection or stenosis. VERTEBRAL ARTERIES: Left dominant configuration. Both origins are clearly patent. There is no dissection, occlusion or flow-limiting stenosis to the skull base (V1-V3 segments). CTA HEAD FINDINGS  POSTERIOR CIRCULATION: --Vertebral arteries: Moderate stenosis of the left V4 segment due to calcific atherosclerosis. --Inferior cerebellar arteries: Normal. --Basilar artery: Normal. --Superior cerebellar arteries: Normal. --Posterior cerebral arteries (PCA): Normal. ANTERIOR CIRCULATION: --Intracranial internal carotid arteries: Atherosclerotic calcification of the internal carotid arteries at the skull base without hemodynamically significant stenosis. --Anterior cerebral arteries (ACA): Normal. Both A1 segments are present. Patent anterior communicating artery (a-comm). --Middle cerebral arteries (MCA): Normal. VENOUS SINUSES: As permitted by contrast timing, patent. ANATOMIC VARIANTS: None Review of the MIP images confirms the above findings. IMPRESSION: 1. No emergent large vessel occlusion or acute intracranial abnormality. 2. 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 3. Aortic Atherosclerosis (ICD10-I70.0). Electronically Signed   By: Ulyses Jarred M.D.   On: 07/12/2020 04:00   CT Head Wo Contrast  Result Date: 07/11/2020 CLINICAL DATA:  Increased dizziness today.  History of vertigo. EXAM: CT HEAD WITHOUT CONTRAST TECHNIQUE: Contiguous axial images were obtained from the base of the skull through the vertex without intravenous contrast. COMPARISON:  Cranial MRI 11/15/2015. FINDINGS: Brain: There is no evidence of acute intracranial hemorrhage, mass lesion, brain edema or extra-axial fluid collection. The ventricles and subarachnoid spaces are appropriately sized for age. There is no CT evidence of acute cortical infarction. Stable mild chronic small vessel ischemic changes in the periventricular white matter. Vascular: Intracranial vascular calcifications. No hyperdense vessel identified. Skull: Negative for fracture or focal lesion. Sinuses/Orbits: The visualized paranasal sinuses and mastoid air cells are clear. No acute orbital abnormalities are seen.  Small extraconal lesion medially in the right orbit adjacent to the medial rectus muscle is unchanged (image 21/4). Other: None. IMPRESSION: No acute intracranial findings. Stable mild chronic small vessel ischemic changes. Electronically Signed   By: Richardean Sale M.D.   On: 07/11/2020 12:56   CT ANGIO NECK W OR WO CONTRAST  Result Date: 07/12/2020 CLINICAL DATA:  Dizziness and facial numbness EXAM: CT ANGIOGRAPHY HEAD AND NECK TECHNIQUE: Multidetector CT imaging of the head and neck was performed using the standard protocol during bolus administration of intravenous contrast. Multiplanar CT image reconstructions and MIPs were obtained to evaluate the vascular anatomy. Carotid stenosis measurements (when applicable) are obtained utilizing NASCET criteria, using the distal internal carotid diameter as the denominator. CONTRAST:  77mL OMNIPAQUE IOHEXOL 350 MG/ML SOLN COMPARISON:  None. FINDINGS: CT HEAD FINDINGS Brain: There is no mass, hemorrhage or extra-axial collection. The size and configuration of the ventricles and extra-axial CSF spaces are normal. There is no acute or chronic infarction. The brain parenchyma is normal. Skull: The visualized skull base, calvarium and extracranial soft tissues are normal. Sinuses/Orbits: No fluid levels or advanced mucosal thickening of the visualized paranasal sinuses. No mastoid or middle ear effusion. The orbits are normal. CTA NECK FINDINGS SKELETON: There is no bony spinal canal stenosis. No lytic or blastic lesion. OTHER NECK: Normal pharynx, larynx and major  salivary glands. No cervical lymphadenopathy. Unremarkable thyroid gland. UPPER CHEST: No pneumothorax or pleural effusion. No nodules or masses. AORTIC ARCH: There is no calcific atherosclerosis of the aortic arch. There is no aneurysm, dissection or hemodynamically significant stenosis of the visualized portion of the aorta. Conventional 3 vessel aortic branching pattern. The visualized proximal subclavian  arteries are widely patent. RIGHT CAROTID SYSTEM: Normal without aneurysm, dissection or stenosis. LEFT CAROTID SYSTEM: Normal without aneurysm, dissection or stenosis. VERTEBRAL ARTERIES: Left dominant configuration. Both origins are clearly patent. There is no dissection, occlusion or flow-limiting stenosis to the skull base (V1-V3 segments). CTA HEAD FINDINGS POSTERIOR CIRCULATION: --Vertebral arteries: Moderate stenosis of the left V4 segment due to calcific atherosclerosis. --Inferior cerebellar arteries: Normal. --Basilar artery: Normal. --Superior cerebellar arteries: Normal. --Posterior cerebral arteries (PCA): Normal. ANTERIOR CIRCULATION: --Intracranial internal carotid arteries: Atherosclerotic calcification of the internal carotid arteries at the skull base without hemodynamically significant stenosis. --Anterior cerebral arteries (ACA): Normal. Both A1 segments are present. Patent anterior communicating artery (a-comm). --Middle cerebral arteries (MCA): Normal. VENOUS SINUSES: As permitted by contrast timing, patent. ANATOMIC VARIANTS: None Review of the MIP images confirms the above findings. IMPRESSION: 1. No emergent large vessel occlusion or acute intracranial abnormality. 2. 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 3. Aortic Atherosclerosis (ICD10-I70.0). Electronically Signed   By: Ulyses Jarred M.D.   On: 07/12/2020 04:00   MR Brain W and Wo Contrast  Result Date: 07/11/2020 CLINICAL DATA:  Transient episode of whole-body weakness and numbness. Delayed speech. Third time having such an episode. EXAM: MRI HEAD WITHOUT AND WITH CONTRAST TECHNIQUE: Multiplanar, multiecho pulse sequences of the brain and surrounding structures were obtained without and with intravenous contrast. CONTRAST:  86mL GADAVIST GADOBUTROL 1 MMOL/ML IV SOLN COMPARISON:  CT head without contrast 07/11/2020. MR head without and with contrast 11/15/2015 FINDINGS: Brain: No  acute infarct, hemorrhage, or mass lesion is present. No significant white matter lesions are present. The ventricles are of normal size. No significant extraaxial fluid collection is present. The internal auditory canals are within normal limits. The brainstem and cerebellum are within normal limits. Vascular: Flow is present in the major intracranial arteries. Skull and upper cervical spine: The craniocervical junction is normal. Upper cervical spine is within normal limits. Marrow signal is unremarkable. Sinuses/Orbits: The paranasal sinuses and mastoid air cells are clear. An extra conal solid and cystic lesion is present in the medial right orbit, medially displacing the right medial rectus muscle. Lesion measures 17 x 5 x 7 mm. Posterior aspect of the lesion enhances. The lesion has not changed in size. No additional lesions are present. IMPRESSION: 1. Stable extra conal solid and cystic lesion in the medial right orbit. 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 It appears to be vascular lesion, unchanged in size over time. Cavernous hemangioma is also considered. 2. Normal MRI appearance of the brain. Electronically Signed   By: San Morelle M.D.   On: 07/11/2020 15:57   DG Chest Portable 1 View  Result Date: 07/11/2020 CLINICAL DATA:  Cough. EXAM: PORTABLE CHEST 1 VIEW COMPARISON:  06/12/2020 FINDINGS: The heart size and mediastinal contours are within normal limits. Both lungs are clear. The visualized skeletal structures are unremarkable. IMPRESSION: No active disease. Electronically Signed   By: Marin Olp M.D.   On: 07/11/2020 12:30   EEG adult  Result Date: 07/12/2020 Lora Havens, MD     07/12/2020 11:32 AM Patient Name: [name] MRN:  [id number] Epilepsy Attending: Lora Havens Referring Physician/Provider: Dr Roland Rack Date: 07/12/2020 Duration: 25.03 mins Patient history: 74 year old female with recurrent episodes of facial paresthesia coupled  with severe bilateral throbbing headache which is more recently been progressing to vertiginous episodes and today, possible lightheadedness/presyncope as well. EEG to evaluate for seizure. Level of alertness: Awake AEDs during EEG study: Xanax Technical aspects: This EEG study was done with scalp electrodes positioned according to the 10-20 International system of electrode placement. Electrical activity was acquired at a sampling rate of 500Hz  and reviewed with a high frequency filter of 70Hz  and a low frequency filter of 1Hz . EEG data were recorded continuously and digitally stored. Description: No posterior dominant rhythm was seen. There is an excessive amount of 15 to 18 Hz beta activity distributed symmetrically and diffusely.  Physiology photic driving was seen during photic stimulation.  Hyperventilation was not performed.   ABNORMALITY -Excessive beta, generalized IMPRESSION: This study is within normal limits. The excessive beta activity seen in the background is most likely due to the effect of benzodiazepine and is a benign EEG pattern.  No seizures or epileptiform discharges were seen throughout the recording. Lora Havens   ECHOCARDIOGRAM COMPLETE  Result Date: 07/12/2020    ECHOCARDIOGRAM REPORT   Patient Name:   [name] Date of Exam: 07/12/2020 Medical Rec #:  [id number]         Height:       62.0 in Accession #:    [id number]        Weight:       178.0 lb Date of Birth:  May 05, 1946         BSA:          1.819 m Patient Age:    17 years          BP:           162/65 mmHg Patient Gender: F                 HR:           86 bpm. Exam Location:  Inpatient Procedure: 2D Echo, Cardiac Doppler and Color Doppler Indications:    Dizziness [id number]  History:        Patient has prior history of Echocardiogram examinations, most                 recent 02/22/2018. Risk Factors:Hypertension. Pulmonary Embolism.                 GERD. Hypothyroidism.  Sonographer:    Jonelle Sidle Dance Referring Phys:  [id number] [place]  1. Left ventricular ejection fraction, by estimation, is 60 to 65%. The left ventricle has normal function. The left ventricle has no regional wall motion abnormalities. Left ventricular diastolic parameters are consistent with Grade I diastolic dysfunction (impaired relaxation).  2. Right ventricular systolic function is normal. The right ventricular size is normal.  3. Left atrial size was mildly dilated.  4. The mitral valve is abnormal. Trivial mitral valve regurgitation.  5. The aortic valve is tricuspid. Aortic valve regurgitation is not visualized. FINDINGS  Left Ventricle: Left ventricular ejection fraction, by estimation, is 60 to 65%. The left ventricle has normal function. The left ventricle has no regional wall motion abnormalities. The left ventricular internal cavity size was normal in size. There is  no left ventricular hypertrophy. Left ventricular diastolic parameters are consistent with Grade I diastolic dysfunction (impaired relaxation). Indeterminate filling pressures. Right Ventricle: The  right ventricular size is normal. No increase in right ventricular wall thickness. Right ventricular systolic function is normal. Left Atrium: Left atrial size was mildly dilated. Right Atrium: Right atrial size was normal in size. Pericardium: There is no evidence of pericardial effusion. Mitral Valve: The mitral valve is abnormal. There is mild thickening of the mitral valve leaflet(s). Trivial mitral valve regurgitation. Tricuspid Valve: The tricuspid valve is grossly normal. Tricuspid valve regurgitation is trivial. Aortic Valve: The aortic valve is tricuspid. Aortic valve regurgitation is not visualized. Pulmonic Valve: The pulmonic valve was normal in structure. Pulmonic valve regurgitation is not visualized. Aorta: The aortic root and ascending aorta are structurally normal, with no evidence of dilitation. IAS/Shunts: No atrial level shunt detected by color flow  Doppler.  LEFT VENTRICLE PLAX 2D LVIDd:         4.10 cm  Diastology LVIDs:         2.70 cm  LV e' lateral:   10.60 cm/s LV PW:         1.10 cm  LV E/e' lateral: 7.8 LV IVS:        0.90 cm  LV e' medial:    9.57 cm/s LVOT diam:     2.10 cm  LV E/e' medial:  8.7 LV SV:         71 LV SV Index:   39 LVOT Area:     3.46 cm  RIGHT VENTRICLE             IVC RV Basal diam:  2.40 cm     IVC diam: 1.70 cm RV S prime:     18.60 cm/s TAPSE (M-mode): 2.1 cm LEFT ATRIUM             Index       RIGHT ATRIUM          Index LA diam:        4.10 cm 2.25 cm/m  RA Area:     9.84 cm LA Vol (A2C):   74.6 ml 41.00 ml/m RA Volume:   16.70 ml 9.18 ml/m LA Vol (A4C):   57.1 ml 31.39 ml/m LA Biplane Vol: 68.8 ml 37.82 ml/m  AORTIC VALVE LVOT Vmax:   103.00 cm/s LVOT Vmean:  67.900 cm/s LVOT VTI:    0.204 m  AORTA Ao Root diam: 3.30 cm Ao Asc diam:  3.40 cm MITRAL VALVE MV Area (PHT): 2.83 cm     SHUNTS MV Decel Time: 268 msec     Systemic VTI:  0.20 m MV E velocity: 83.10 cm/s   Systemic Diam: 2.10 cm MV A velocity: 102.00 cm/s MV E/A ratio:  0.81 Lyman Bishop MD Electronically signed by Lyman Bishop MD Signature Date/Time: 07/12/2020/4:05:00 PM    Final     Microbiology: Recent Results (from the past 240 hour(s))  SARS Coronavirus 2 by RT PCR (hospital order, performed in [place] hospital lab) Nasopharyngeal Nasopharyngeal Swab     Status: None   Collection Time: 07/11/20 12:00 PM   Specimen: Nasopharyngeal Swab  Result Value Ref Range Status   SARS Coronavirus 2 NEGATIVE NEGATIVE Final    Comment: (NOTE) SARS-CoV-2 target nucleic acids are NOT DETECTED.  The SARS-CoV-2 RNA is generally detectable in upper and lower respiratory specimens during the acute phase of infection. The lowest concentration of SARS-CoV-2 viral copies this assay can detect is 250 copies / mL. A negative result does not preclude SARS-CoV-2 infection and should not be used as the sole basis for treatment  or other patient management  decisions.  A negative result may occur with improper specimen collection / handling, submission of specimen other than nasopharyngeal swab, presence of viral mutation(s) within the areas targeted by this assay, and inadequate number of viral copies (<250 copies / mL). A negative result must be combined with clinical observations, patient history, and epidemiological information.  Fact Sheet for Patients:   StrictlyIdeas.no  Fact Sheet for Healthcare Providers: BankingDealers.co.za  This test is not yet approved or  cleared by the [place] FDA and has been authorized for detection and/or diagnosis of SARS-CoV-2 by FDA under an Emergency Use Authorization (EUA).  This EUA will remain in effect (meaning this test can be used) for the duration of the COVID-19 declaration under Section 564(b)(1) of the Act, 21 U.S.C. section 360bbb-3(b)(1), unless the authorization is terminated or revoked sooner.  Performed at [facility], [address] [place], [place] [address]      Labs: Basic Metabolic Panel: Recent Labs  Lab 07/11/20 1200  NA 139  K 3.9  CL 103  CO2 24  GLUCOSE 96  BUN 14  CREATININE 0.95  CALCIUM 9.3   Liver Function Tests: Recent Labs  Lab 07/11/20 1200  AST 27  ALT 15  ALKPHOS 109  BILITOT 0.5  PROT 6.3*  ALBUMIN 3.4*   No results for input(s): LIPASE, AMYLASE in the last 168 hours. No results for input(s): AMMONIA in the last 168 hours. CBC: Recent Labs  Lab 07/11/20 1200  WBC 9.4  NEUTROABS 3.0  HGB 11.1*  HCT 35.8*  MCV 86.3  PLT 392   Cardiac Enzymes: No results for input(s): CKTOTAL, CKMB, CKMBINDEX, TROPONINI in the last 168 hours. BNP: BNP (last 3 results) No results for input(s): BNP in the last 8760 hours.  ProBNP (last 3 results) No results for input(s): PROBNP in the last 8760 hours.  CBG: No results for input(s): GLUCAP in the last 168  hours.     Signed:  Cristal Ford  [facility] 07/13/2020, 2:17 PM

## 2020-07-13 NOTE — Progress Notes (Signed)
EEG is unremarkable.   CTA of head and neck shows no emergent large vessel occlusion or acute intracranial abnormality. 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  Neurology will sign off. Please call if there are additional questions.   Electronically signed: Dr. Kerney Elbe

## 2020-07-13 NOTE — TOC Transition Note (Signed)
Transition of Care [facility]) - CM/SW Discharge Note   Patient Details  Name: [name] MRN: [id number] Date of Birth: 25-Sep-1946  [facility] CM/SW Contact:  Pollie Friar, RN Phone Number: 07/13/2020, 3:38 PM   Clinical Narrative:    Pt with recommendations for outpatient PT. Pt was already active with [facility] in [place]. Orders faxed to [facility] to resume services.  Pt asking for a new [facility] PCP. CM was able to obtain an appt with [facility] in [place] and placed the information on the AVS.  Pt has transportation home.    Final next level of care: OP Rehab Barriers to Discharge: No Barriers Identified   Patient Goals and CMS Choice        Discharge Placement                       Discharge Plan and Services                                     Social Determinants of Health (SDOH) Interventions     Readmission Risk Interventions No flowsheet data found.

## 2020-07-16 ENCOUNTER — Telehealth: Payer: Self-pay | Admitting: Neurology

## 2020-07-16 NOTE — Telephone Encounter (Signed)
Message made in Error Disregard

## 2020-07-21 ENCOUNTER — Other Ambulatory Visit: Payer: Self-pay

## 2020-07-21 ENCOUNTER — Ambulatory Visit (INDEPENDENT_AMBULATORY_CARE_PROVIDER_SITE_OTHER): Payer: Medicare Other | Admitting: Diagnostic Neuroimaging

## 2020-07-21 ENCOUNTER — Encounter: Payer: Self-pay | Admitting: Diagnostic Neuroimaging

## 2020-07-21 VITALS — BP 142/80 | HR 76 | Ht 61.5 in | Wt 176.4 lb

## 2020-07-21 DIAGNOSIS — R42 Dizziness and giddiness: Secondary | ICD-10-CM | POA: Diagnosis not present

## 2020-07-21 DIAGNOSIS — R4689 Other symptoms and signs involving appearance and behavior: Secondary | ICD-10-CM | POA: Diagnosis not present

## 2020-07-21 NOTE — Progress Notes (Signed)
[facility]  PATIENT: [name] DOB: [birth year]  REFERRING CLINICIAN: Blair Heys, PA-C HISTORY FROM: patient  REASON FOR VISIT: consult    HISTORICAL  CHIEF COMPLAINT:  Chief Complaint  Patient presents with  . Confusion    rm 7 New Pt, hospital FU  brother- [name] "thought she might have had TIA but maybe drug to drug interaction; feeling well now"    HISTORY OF PRESENT ILLNESS:   74 year old female here for evaluation of dizziness and headache. Patient presented to the emergency room in July 2021 for worsening headache and dizziness, confusion, slurred speech. Patient was admitted for further work-up evaluation. CT, CTA, MRI of the brain showed no acute findings. Possibility of complicated migraine was raised. Patient also has been having some memory loss problems for several months or several years, on donepezil per PCP. Has been diagnosed with possible dementia. Also with polypharmacy for chronic pain and depression/anxiety issues.  Since discharge patient is doing well. She is back to baseline.   REVIEW OF SYSTEMS: Full 14 system review of systems performed and negative with exception of: As per HPI.  ALLERGIES: Allergies  Allergen Reactions  . Naproxen Shortness Of Breath, Itching and Swelling  . Tizanidine Shortness Of Breath    dizziness    HOME MEDICATIONS: Outpatient Medications Prior to Visit  Medication Sig Dispense Refill  . acetaminophen (ACETAMINOPHEN 8 HOUR) 650 MG CR tablet Take 650 mg by mouth every 8 (eight) hours as needed for pain.    Marland Kitchen albuterol (PROAIR HFA) 108 (90 BASE) MCG/ACT inhaler Inhale 2 puffs into the lungs every 4 (four) hours as needed for wheezing or shortness of breath. 2 Inhaler prn  . albuterol (PROVENTIL) (2.5 MG/3ML) 0.083% nebulizer solution Take 3 mLs (2.5 mg total) by nebulization every 6 (six) hours as needed for wheezing or shortness of breath. 1080 mL 3  . amLODipine (NORVASC) 10 MG tablet Take 10  mg by mouth daily.     Marland Kitchen aspirin EC 81 MG tablet Take 81 mg by mouth daily. Swallow whole.    . benzonatate (TESSALON PERLES) 100 MG capsule Take 2 capsules (200 mg total) by mouth 2 (two) times daily as needed for cough. 20 capsule 0  . budesonide (PULMICORT) 0.25 MG/2ML nebulizer solution Take 2 mLs (0.25 mg total) by nebulization 2 (two) times daily. Dx: J45.20 120 mL 5  . buPROPion (WELLBUTRIN XL) 150 MG 24 hr tablet Take 150 mg by mouth daily.    Marland Kitchen co-enzyme Q-10 30 MG capsule Take 30 mg by mouth 3 (three) times daily. Gummies    . donepezil (ARICEPT) 5 MG tablet Take 5 mg by mouth at bedtime.    . fluticasone (FLONASE) 50 MCG/ACT nasal spray Place 1 spray into both nostrils daily.    . Fluticasone-Salmeterol (ADVAIR DISKUS) 250-50 MCG/DOSE AEPB Inhale 3 puffs into the lungs daily.    Marland Kitchen latanoprost (XALATAN) 0.005 % ophthalmic solution Place 1 drop into both eyes at bedtime.   2  . levocetirizine (XYZAL) 5 MG tablet Take 5 mg by mouth every evening.    Marland Kitchen levothyroxine (SYNTHROID, LEVOTHROID) 50 MCG tablet Take 50 mcg by mouth daily before breakfast.     . montelukast (SINGULAIR) 10 MG tablet Take 10 mg by mouth at bedtime.    . pantoprazole (PROTONIX) 40 MG tablet Take 40 mg by mouth daily.   0  . pregabalin (LYRICA) 75 MG capsule Take 75 mg by mouth 2 (two) times daily.    Marland Kitchen  rOPINIRole (REQUIP) 2 MG tablet Take 2 mg by mouth at bedtime.   1  . senna (SENOKOT) 8.6 MG TABS tablet Take 8.6 mg by mouth at bedtime.     Marland Kitchen venlafaxine XR (EFFEXOR-XR) 75 MG 24 hr capsule Take 150 mg by mouth daily with breakfast.     . Vitamin D, Ergocalciferol, (DRISDOL) 50000 units CAPS capsule Take 50,000 Units by mouth once a week.    Alveda Reasons 10 MG TABS tablet Take 1 tablet (10 mg total) by mouth daily with supper. (Patient taking differently: Take 10 mg by mouth daily. ) 30 tablet 12  . ALPRAZolam (XANAX) 0.5 MG tablet Take 0.5 mg by mouth at bedtime.  (Patient not taking: Reported on 07/21/2020)    .  Apoaequorin (PREVAGEN EXTRA STRENGTH PO) Take 1 tablet by mouth daily.     No facility-administered medications prior to visit.    PAST MEDICAL HISTORY: Past Medical History:  Diagnosis Date  . Allergic rhinitis   . Allergic rhinitis   . Anxiety   . Arthritis    "hands, feet, shoulders, arms" (02/22/2018)  . Bilateral pulmonary embolism ([facility]) 02/21/2018  . Chronic cough   . Depression   . Dupuytren contracture    "both feet, both hands" (02/22/2018)  . DVT (deep venous thrombosis) ([facility])    bil le  . GERD (gastroesophageal reflux disease)   . Glaucoma   . Hypertension   . Hypothyroidism   . Mild persistent asthma    cougher not a wheezr gets chokes easily  . Pneumonia   . PONV (postoperative nausea and vomiting)     PAST SURGICAL HISTORY: Past Surgical History:  Procedure Laterality Date  . BLADDER SUSPENSION  X 2  . BUNIONECTOMY     RIGHT FOOT ARCH REPAIRED AS WELL  . CATARACT EXTRACTION W/ INTRAOCULAR LENS  IMPLANT, BILATERAL Bilateral   . DILATION AND CURETTAGE OF UTERUS  X 3  . DUPUYTREN CONTRACTURE RELEASE Bilateral    "feet"  . FASCIECTOMY Right 01/23/2018   Procedure: SEGMENTAL FASCIECTOMY RIGHT MIDDLE FINGER;  Surgeon: Daryll Brod, MD;  Location: [facility];  Service: Orthopedics;  Laterality: Right;  AXILLARY BLOCK  . KNEE ARTHROSCOPY Right   . REDUCTION MAMMAPLASTY Bilateral 2018  . TOTAL ABDOMINAL HYSTERECTOMY  1975   "got pregnant w/IUD in then lost baby"  . TOTAL KNEE ARTHROPLASTY Right 06/08/2020   Procedure: TOTAL KNEE ARTHROPLASTY;  Surgeon: Vickey Huger, MD;  Location: [facility];  Service: Orthopedics;  Laterality: Right;    FAMILY HISTORY: Family History  Problem Relation Age of Onset  . Pneumonia Father   . Arthritis Mother     SOCIAL HISTORY: Social History   Socioeconomic History  . Marital status: Widowed    Spouse name: Not on file  . Number of children: 2  . Years of education: 48  . Highest education level: Not on file   Occupational History  . Occupation: Retired  Tobacco Use  . Smoking status: Never Smoker  . Smokeless tobacco: Never Used  Vaping Use  . Vaping Use: Never used  Substance and Sexual Activity  . Alcohol use: Not Currently    Comment: 02/22/2018 "might have a drink q couple years"  . Drug use: No  . Sexual activity: Not Currently  Other Topics Concern  . Not on file  Social History Narrative   Lives at home with her brother.   Right-handed.   3-4 cups coffee per day.   Social Determinants of Health  Financial Resource Strain:   . Difficulty of Paying Living Expenses:   Food Insecurity:   . Worried About Charity fundraiser in the Last Year:   . Arboriculturist in the Last Year:   Transportation Needs:   . Film/video editor (Medical):   Marland Kitchen Lack of Transportation (Non-Medical):   Physical Activity:   . Days of Exercise per Week:   . Minutes of Exercise per Session:   Stress:   . Feeling of Stress :   Social Connections:   . Frequency of Communication with Friends and Family:   . Frequency of Social Gatherings with Friends and Family:   . Attends Religious Services:   . Active Member of Clubs or Organizations:   . Attends Archivist Meetings:   Marland Kitchen Marital Status:   Intimate Partner Violence:   . Fear of Current or Ex-Partner:   . Emotionally Abused:   Marland Kitchen Physically Abused:   . Sexually Abused:      PHYSICAL EXAM  GENERAL EXAM/CONSTITUTIONAL: Vitals:  Vitals:   07/21/20 1410  BP: (!) 142/80  Pulse: 76  Weight: 176 lb 6.4 oz (80 kg)  Height: 5' 1.5" (1.562 m)     Body mass index is 32.79 kg/m. Wt Readings from Last 3 Encounters:  07/21/20 176 lb 6.4 oz (80 kg)  07/11/20 178 lb (80.7 kg)  06/12/20 180 lb (81.6 kg)     Patient is in no distress; well developed, nourished and groomed; neck is supple  CARDIOVASCULAR:  Examination of carotid arteries is normal; no carotid bruits  Regular rate and rhythm, no murmurs  Examination of  peripheral vascular system by observation and palpation is normal  EYES:  Ophthalmoscopic exam of optic discs and posterior segments is normal; no papilledema or hemorrhages  No exam data present  MUSCULOSKELETAL:  Gait, strength, tone, movements noted in Neurologic exam below  NEUROLOGIC: MENTAL STATUS:  No flowsheet data found.  awake, alert, oriented to person, place and time  recent and remote memory intact  normal attention and concentration  language fluent, comprehension intact, naming intact  fund of knowledge appropriate  CRANIAL NERVE:   2nd - no papilledema on fundoscopic exam  2nd, 3rd, 4th, 6th - pupils equal and reactive to light, visual fields full to confrontation, extraocular muscles intact, no nystagmus  5th - facial sensation symmetric  7th - facial strength symmetric  8th - hearing intact  9th - palate elevates symmetrically, uvula midline  11th - shoulder shrug symmetric  12th - tongue protrusion midline  MOTOR:   normal bulk and tone, full strength in the BUE, BLE  SENSORY:   normal and symmetric to light touch, temperature, vibration  COORDINATION:   finger-nose-finger, fine finger movements normal  REFLEXES:   deep tendon reflexes TRACE and symmetric  GAIT/STATION:   narrow based gait     DIAGNOSTIC DATA (LABS, IMAGING, TESTING) - I reviewed patient records, labs, notes, testing and imaging myself where available.  Lab Results  Component Value Date   WBC 9.4 07/11/2020   HGB 11.1 (L) 07/11/2020   HCT 35.8 (L) 07/11/2020   MCV 86.3 07/11/2020   PLT 392 07/11/2020      Component Value Date/Time   NA 139 07/11/2020 1200   NA 141 11/03/2015 1117   K 3.9 07/11/2020 1200   CL 103 07/11/2020 1200   CO2 24 07/11/2020 1200   GLUCOSE 96 07/11/2020 1200   BUN 14 07/11/2020 1200   BUN 22 11/03/2015  1117   CREATININE 0.95 07/11/2020 1200   CREATININE 0.98 03/27/2020 0751   CALCIUM 9.3 07/11/2020 1200   PROT 6.3 (L)  07/11/2020 1200   PROT 6.5 11/03/2015 1117   ALBUMIN 3.4 (L) 07/11/2020 1200   ALBUMIN 4.1 11/03/2015 1117   AST 27 07/11/2020 1200   AST 20 03/27/2020 0751   ALT 15 07/11/2020 1200   ALT 12 03/27/2020 0751   ALKPHOS 109 07/11/2020 1200   BILITOT 0.5 07/11/2020 1200   BILITOT 0.4 03/27/2020 0751   GFRNONAA 60 (L) 07/11/2020 1200   GFRNONAA 58 (L) 03/27/2020 0751   GFRAA >60 07/11/2020 1200   GFRAA >60 03/27/2020 0751   No results found for: CHOL, HDL, LDLCALC, LDLDIRECT, TRIG, CHOLHDL Lab Results  Component Value Date   HGBA1C 6.9 (H) 02/22/2018   Lab Results  Component Value Date   VITAMINB12 341 07/13/2020   Lab Results  Component Value Date   TSH 1.616 07/13/2020    07/11/20 MRI brain [I reviewed images myself and agree with interpretation. -VRP]  1. Stable extra conal solid and cystic lesion in the medial right orbit. 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 It appears to be vascular lesion, unchanged in size over time. Cavernous hemangioma is also considered. 2. Normal MRI appearance of the brain.  07/12/20 TTE 1. Left ventricular ejection fraction, by estimation, is 60 to 65%. The  left ventricle has normal function. The left ventricle has no regional  wall motion abnormalities. Left ventricular diastolic parameters are  consistent with Grade I diastolic  dysfunction (impaired relaxation).  2. Right ventricular systolic function is normal. The right ventricular  size is normal.  3. Left atrial size was mildly dilated.  4. The mitral valve is abnormal. Trivial mitral valve regurgitation.  5. The aortic valve is tricuspid. Aortic valve regurgitation is not  visualized.     ASSESSMENT AND PLAN  74 y.o. year old female here with:  Dx:  1. Dizziness   2. Spell of abnormal behavior     PLAN:  CONFUSION / WEAKNESS / HEADACHES (possible complicated migraine vs polypharmacy) - resolved; monitor  MILD MEMORY LOSS / ANXIETY / DEPRESSION /  CHRONIC PAIN - on donepezil per PCP - follow up with psychiatry (venlafaxine, bupropion, pregabalin)  RIGHT EYE MASS - follow up with ophthalmology  Return for pending if symptoms worsen or fail to improve, return to PCP.    Penni Bombard, MD 0/0/3704, 8:88 PM Certified in Neurology, Neurophysiology and Neuroimaging  [facility] [address] [place], [place] [address] [phone]

## 2020-07-21 NOTE — Patient Instructions (Signed)
CONFUSION / WEAKNESS / HEADACHES (possible complicated migraine vs polypharmacy) - resolved; monitor  MILD MEMORY LOSS / ANXIETY / DEPRESSION / CHRONIC PAIN - on donepezil per PCP - follow up with psychiatry (venlafaxine, bupropion, pregabalin)  RIGHT EYE MASS - follow up with ophthalmology

## 2020-07-22 ENCOUNTER — Other Ambulatory Visit: Payer: Self-pay

## 2020-07-22 ENCOUNTER — Ambulatory Visit (INDEPENDENT_AMBULATORY_CARE_PROVIDER_SITE_OTHER): Payer: Medicare Other | Admitting: Physician Assistant

## 2020-07-22 ENCOUNTER — Encounter: Payer: Self-pay | Admitting: Physician Assistant

## 2020-07-22 VITALS — BP 110/70 | HR 72 | Temp 97.3°F | Resp 16 | Ht 61.0 in | Wt 176.0 lb

## 2020-07-22 DIAGNOSIS — I10 Essential (primary) hypertension: Secondary | ICD-10-CM

## 2020-07-22 DIAGNOSIS — R413 Other amnesia: Secondary | ICD-10-CM

## 2020-07-22 DIAGNOSIS — K219 Gastro-esophageal reflux disease without esophagitis: Secondary | ICD-10-CM

## 2020-07-22 DIAGNOSIS — E039 Hypothyroidism, unspecified: Secondary | ICD-10-CM | POA: Diagnosis not present

## 2020-07-22 DIAGNOSIS — G2581 Restless legs syndrome: Secondary | ICD-10-CM

## 2020-07-22 DIAGNOSIS — E118 Type 2 diabetes mellitus with unspecified complications: Secondary | ICD-10-CM

## 2020-07-22 LAB — CBC WITH DIFFERENTIAL/PLATELET
Basophils Absolute: 0.1 10*3/uL (ref 0.0–0.1)
Basophils Relative: 1.2 % (ref 0.0–3.0)
Eosinophils Absolute: 0.2 10*3/uL (ref 0.0–0.7)
Eosinophils Relative: 2 % (ref 0.0–5.0)
HCT: 35.7 % — ABNORMAL LOW (ref 36.0–46.0)
Hemoglobin: 11.5 g/dL — ABNORMAL LOW (ref 12.0–15.0)
Lymphocytes Relative: 50.2 % — ABNORMAL HIGH (ref 12.0–46.0)
Lymphs Abs: 4.7 10*3/uL — ABNORMAL HIGH (ref 0.7–4.0)
MCHC: 32.1 g/dL (ref 30.0–36.0)
MCV: 83.7 fl (ref 78.0–100.0)
Monocytes Absolute: 0.5 10*3/uL (ref 0.1–1.0)
Monocytes Relative: 5.3 % (ref 3.0–12.0)
Neutro Abs: 3.8 10*3/uL (ref 1.4–7.7)
Neutrophils Relative %: 41.3 % — ABNORMAL LOW (ref 43.0–77.0)
Platelets: 416 10*3/uL — ABNORMAL HIGH (ref 150.0–400.0)
RBC: 4.26 Mil/uL (ref 3.87–5.11)
RDW: 17.4 % — ABNORMAL HIGH (ref 11.5–15.5)
WBC: 9.3 10*3/uL (ref 4.0–10.5)

## 2020-07-22 LAB — LIPID PANEL
Cholesterol: 203 mg/dL — ABNORMAL HIGH (ref 0–200)
HDL: 92.6 mg/dL (ref >39.00)
LDL Cholesterol: 98 mg/dL (ref 0–99)
NonHDL: 110.57
Total CHOL/HDL Ratio: 2
Triglycerides: 64 mg/dL (ref 0.0–149.0)
VLDL: 12.8 mg/dL (ref 0.0–40.0)

## 2020-07-22 LAB — COMPREHENSIVE METABOLIC PANEL
ALT: 11 U/L (ref 0–35)
AST: 23 U/L (ref 0–37)
Albumin: 4.2 g/dL (ref 3.5–5.2)
Alkaline Phosphatase: 123 U/L — ABNORMAL HIGH (ref 39–117)
BUN: 14 mg/dL (ref 6–23)
CO2: 26 mEq/L (ref 19–32)
Calcium: 9.8 mg/dL (ref 8.4–10.5)
Chloride: 102 mEq/L (ref 96–112)
Creatinine, Ser: 1 mg/dL (ref 0.40–1.20)
GFR: 54.43 mL/min — ABNORMAL LOW (ref >60.00)
Glucose, Bld: 115 mg/dL — ABNORMAL HIGH (ref 70–99)
Potassium: 4.1 mEq/L (ref 3.5–5.1)
Sodium: 138 mEq/L (ref 135–145)
Total Bilirubin: 0.4 mg/dL (ref 0.2–1.2)
Total Protein: 7 g/dL (ref 6.0–8.3)

## 2020-07-22 LAB — HEMOGLOBIN A1C: Hgb A1c MFr Bld: 5.8 % (ref 4.6–6.5)

## 2020-07-22 MED ORDER — FAMOTIDINE 20 MG PO TABS
20.0000 mg | ORAL_TABLET | Freq: Every day | ORAL | 1 refills | Status: DC
Start: 2020-07-22 — End: 2020-08-21

## 2020-07-22 NOTE — Patient Instructions (Signed)
Please go to the lab today for blood work.  I will call you with your results. We will alter treatment regimen(s) if indicated by your results.   Once I get results we are going to make further changes to medication regimen.   I do want you to start an nightly famotidine 20 mg to help further with your reflux. A prescription has been sent in. You will continue the Protonix as directed.  Make sure to follow-up with Psychiatry as scheduled.   It was very nice meeting you today. Welcome to [facility]!

## 2020-07-22 NOTE — Progress Notes (Signed)
Patient presents to clinic today with her brother to establish care. She was recently hospitalized for confusion and needs follow-up. Did not want to return to her previous PCP.   Patient presented to ER on 07/11/2020 with c/o dizziness and headache. ER workup included normal UA, baseline CBC and CMP, negative COVID screen, non-acute CXR and negative CT Head/CTA Neck. Was transferred to  [facility] for MRI and neuro consult. MRI negative for acute findings. Patient was consulted by neruology and subsequently admitted to the hospital for TIA workup.   During hospitalization, further workup performed including echocardiogram (EF 60-65% with Grade I Diastolic dysfunction), EEG (benign pattern consistent with her BZD use), TSH, B12 and Folate (all within normal limits). Symptoms had resolved during hospitalization. As such, Neurology felt no additional inpatient workup needed. Felt that symptoms were stemming from underlying dementia and polypharmacy. Zanaflex was stopped but she was kept on her Xanax, Wellbutrin, Effexor, Aricept, Oxycodone, Lyrica and Requip. Was recommended she have outpatient Neurology and Psychiatry follow-up.  Since discharge, patient notes she has been doing much better. She has stopped her Oxycodone as well. Is continuing her Aricept, Wellbutrin, Effexor, Lyrica and Requip. Denies any recurrence of symptoms. Both she and her brother who is with her today feel that there is a gradual but sure deterioration in her memory despite medication. They have had a follow-up with Neurology since coming home from the hospital -- actually having seen her regular Neurologist (Dr. Laurena Slimmer with [facility]) and having a consult with a [facility]. Has scheduled follow-up with Psychiatry on 08/06/2020.  Patient with history of GERD, currently on Protonix. Has noted slight increase in symptoms recently. Notes some globus sensation. Denies vomiting or epigastric pain.   Health Maintenance: Immunizations  -- Will get records.  Colonoscopy -- UTD per patient. Records requested. Mammogram -- UTD per patient. Records requested.   Past Medical History:  Diagnosis Date  . Allergic rhinitis   . Allergic rhinitis   . Anxiety   . Arthritis    "hands, feet, shoulders, arms" (02/22/2018)  . Bilateral pulmonary embolism ([facility]) 02/21/2018  . Chronic cough   . Depression   . Dupuytren contracture    "both feet, both hands" (02/22/2018)  . DVT (deep venous thrombosis) ([facility])    bil le  . GERD (gastroesophageal reflux disease)   . Glaucoma   . Hypertension   . Hypothyroidism   . Mild persistent asthma    cougher not a wheezr gets chokes easily  . Pneumonia   . PONV (postoperative nausea and vomiting)     Past Surgical History:  Procedure Laterality Date  . BLADDER SUSPENSION  X 2  . BUNIONECTOMY     RIGHT FOOT ARCH REPAIRED AS WELL  . CATARACT EXTRACTION W/ INTRAOCULAR LENS  IMPLANT, BILATERAL Bilateral   . DILATION AND CURETTAGE OF UTERUS  X 3  . DUPUYTREN CONTRACTURE RELEASE Bilateral    "feet"  . FASCIECTOMY Right 01/23/2018   Procedure: SEGMENTAL FASCIECTOMY RIGHT MIDDLE FINGER;  Surgeon: Daryll Brod, MD;  Location: [facility];  Service: Orthopedics;  Laterality: Right;  AXILLARY BLOCK  . KNEE ARTHROSCOPY Right   . REDUCTION MAMMAPLASTY Bilateral 2018  . TOTAL ABDOMINAL HYSTERECTOMY  1975   "got pregnant w/IUD in then lost baby"  . TOTAL KNEE ARTHROPLASTY Right 06/08/2020   Procedure: TOTAL KNEE ARTHROPLASTY;  Surgeon: Vickey Huger, MD;  Location: [facility];  Service: Orthopedics;  Laterality: Right;    Current Outpatient Medications on File Prior to Visit  Medication Sig Dispense Refill  . acetaminophen (ACETAMINOPHEN 8 HOUR) 650 MG CR tablet Take 650 mg by mouth every 8 (eight) hours as needed for pain.    Marland Kitchen albuterol (PROAIR HFA) 108 (90 BASE) MCG/ACT inhaler Inhale 2 puffs into the lungs every 4 (four) hours as needed for wheezing or shortness of breath. 2 Inhaler prn  .  albuterol (PROVENTIL) (2.5 MG/3ML) 0.083% nebulizer solution Take 3 mLs (2.5 mg total) by nebulization every 6 (six) hours as needed for wheezing or shortness of breath. 1080 mL 3  . amLODipine (NORVASC) 10 MG tablet Take 10 mg by mouth daily.     Marland Kitchen aspirin EC 81 MG tablet Take 81 mg by mouth daily. Swallow whole.    . budesonide (PULMICORT) 0.25 MG/2ML nebulizer solution Take 2 mLs (0.25 mg total) by nebulization 2 (two) times daily. Dx: J45.20 120 mL 5  . buPROPion (WELLBUTRIN XL) 150 MG 24 hr tablet Take 150 mg by mouth daily.    Marland Kitchen co-enzyme Q-10 30 MG capsule Take 30 mg by mouth 3 (three) times daily. Gummies    . donepezil (ARICEPT) 5 MG tablet Take 5 mg by mouth at bedtime.    . fluticasone (FLONASE) 50 MCG/ACT nasal spray Place 1 spray into both nostrils daily.    . Fluticasone-Salmeterol (ADVAIR DISKUS) 250-50 MCG/DOSE AEPB Inhale 3 puffs into the lungs daily.    Marland Kitchen latanoprost (XALATAN) 0.005 % ophthalmic solution Place 1 drop into both eyes at bedtime.   2  . levothyroxine (SYNTHROID, LEVOTHROID) 50 MCG tablet Take 50 mcg by mouth daily before breakfast.     . montelukast (SINGULAIR) 10 MG tablet Take 10 mg by mouth at bedtime.    . pantoprazole (PROTONIX) 40 MG tablet Take 40 mg by mouth daily.   0  . pregabalin (LYRICA) 75 MG capsule Take 75 mg by mouth 2 (two) times daily.    Marland Kitchen rOPINIRole (REQUIP) 2 MG tablet Take 2 mg by mouth at bedtime.   1  . senna (SENOKOT) 8.6 MG TABS tablet Take 8.6 mg by mouth at bedtime.     Marland Kitchen venlafaxine XR (EFFEXOR-XR) 75 MG 24 hr capsule Take 150 mg by mouth daily with breakfast.     . Vitamin D, Ergocalciferol, (DRISDOL) 50000 units CAPS capsule Take 50,000 Units by mouth once a week.    Alveda Reasons 10 MG TABS tablet Take 1 tablet (10 mg total) by mouth daily with supper. (Patient taking differently: Take 10 mg by mouth daily. ) 30 tablet 12   No current facility-administered medications on file prior to visit.    Allergies  Allergen Reactions  .  Naproxen Shortness Of Breath, Itching and Swelling  . Tizanidine Shortness Of Breath    dizziness    Family History  Problem Relation Age of Onset  . Pneumonia Father   . Arthritis Mother   . Diabetes Brother     Social History   Socioeconomic History  . Marital status: Widowed    Spouse name: Not on file  . Number of children: 2  . Years of education: 59  . Highest education level: Not on file  Occupational History  . Occupation: Retired  Tobacco Use  . Smoking status: Never Smoker  . Smokeless tobacco: Never Used  Vaping Use  . Vaping Use: Never used  Substance and Sexual Activity  . Alcohol use: Not Currently    Comment: 02/22/2018 "might have a drink q couple years"  . Drug use: No  .  Sexual activity: Not Currently  Other Topics Concern  . Not on file  Social History Narrative   Lives at home with her brother.   Right-handed.   3-4 cups coffee per day.   Social Determinants of Health   Financial Resource Strain:   . Difficulty of Paying Living Expenses: Not on file  Food Insecurity:   . Worried About Charity fundraiser in the Last Year: Not on file  . Ran Out of Food in the Last Year: Not on file  Transportation Needs:   . Lack of Transportation (Medical): Not on file  . Lack of Transportation (Non-Medical): Not on file  Physical Activity:   . Days of Exercise per Week: Not on file  . Minutes of Exercise per Session: Not on file  Stress:   . Feeling of Stress : Not on file  Social Connections:   . Frequency of Communication with Friends and Family: Not on file  . Frequency of Social Gatherings with Friends and Family: Not on file  . Attends Religious Services: Not on file  . Active Member of Clubs or Organizations: Not on file  . Attends Archivist Meetings: Not on file  . Marital Status: Not on file  Intimate Partner Violence:   . Fear of Current or Ex-Partner: Not on file  . Emotionally Abused: Not on file  . Physically Abused: Not on  file  . Sexually Abused: Not on file   ROS Pertinent ROS are listed in the HPI.   BP 110/70   Pulse 72   Temp (!) 97.3 F (36.3 C) (Temporal)   Resp 16   Ht _0  (1.549 m)   Wt 176 lb (79.8 kg)   SpO2 97%   BMI 33.25 kg/m   Physical Exam  Recent Results (from the past 2160 hour(s))  Surgical pcr screen     Status: None   Collection Time: 05/28/20  1:00 PM   Specimen: Nasal Mucosa; Nasal Swab  Result Value Ref Range   MRSA, PCR NEGATIVE NEGATIVE   Staphylococcus aureus NEGATIVE NEGATIVE    Comment: (NOTE) The Xpert SA Assay (FDA approved for NASAL specimens in patients 48 years of age and older), is one component of a comprehensive surveillance program. It is not intended to diagnose infection nor to guide or monitor treatment. Performed at [facility], [place] [address], [place], [place] [address]   Basic metabolic panel     Status: Abnormal   Collection Time: 05/28/20  1:00 PM  Result Value Ref Range   Sodium 140 135 - 145 mmol/L   Potassium 5.2 (H) 3.5 - 5.1 mmol/L   Chloride 105 98 - 111 mmol/L   CO2 25 22 - 32 mmol/L   Glucose, Bld 98 70 - 99 mg/dL    Comment: Glucose reference range applies only to samples taken after fasting for at least 8 hours.   BUN 12 8 - 23 mg/dL   Creatinine, Ser 1.12 (H) 0.44 - 1.00 mg/dL   Calcium 9.3 8.9 - 10.3 mg/dL   GFR calc non Af Amer 49 (L) >60 mL/min   GFR calc Af Amer 57 (L) >60 mL/min   Anion gap 10 5 - 15    Comment: Performed at [facility], [place] [address], [place], [place] [address]  CBC     Status: None   Collection Time: 05/28/20  1:00 PM  Result Value Ref Range   WBC 7.0 4.0 - 10.5 K/uL  RBC 4.50 3.87 - 5.11 MIL/uL   Hemoglobin 12.0 12.0 - 15.0 g/dL   HCT 37.1 36 - 46 %   MCV 86.0 80.0 - 100.0 fL   MCH 26.7 26.0 - 34.0 pg   MCHC 31.0 30.0 - 36.0 g/dL   RDW 73.0 70.3 - 37.7 %   Platelets 321 150 - 400 K/uL   nRBC 0.0 0.0 - 0.2 %    Comment: Performed at [facility], [address] [place], [place] [address]  SARS CORONAVIRUS 2 (TAT 6-24 HRS) Nasopharyngeal Nasopharyngeal Swab     Status: None   Collection Time: 06/04/20 11:21 AM   Specimen: Nasopharyngeal Swab  Result Value Ref Range   SARS Coronavirus 2 NEGATIVE NEGATIVE    Comment: (NOTE) SARS-CoV-2 target nucleic acids are NOT DETECTED.  The SARS-CoV-2 RNA is generally detectable in upper and lower respiratory specimens during the acute phase of infection. Negative results do not preclude SARS-CoV-2 infection, do not rule out co-infections with other pathogens, and should not be used as the sole basis for treatment or other patient management decisions. Negative results must be combined with clinical observations, patient history, and epidemiological information. The expected result is Negative.  Fact Sheet for Patients: HairSlick.no  Fact Sheet for Healthcare Providers: quierodirigir.com  This test is not yet approved or cleared by the [place] FDA and  has been authorized for detection and/or diagnosis of SARS-CoV-2 by FDA under an Emergency Use Authorization (EUA). This EUA will remain  in effect (meaning this test can be used) for the duration of the COVID-19 declaration under Se ction 564(b)(1) of the Act, 21 U.S.C. section 360bbb-3(b)(1), unless the authorization is terminated or revoked sooner.  Performed at [facility], [address], [place], [place] [address]   CBC WITH DIFFERENTIAL     Status: Abnormal   Collection Time: 06/08/20  8:00 AM  Result Value Ref Range   WBC 9.8 4.0 - 10.5 K/uL   RBC 4.56 3.87 - 5.11 MIL/uL   Hemoglobin 12.2 12.0 - 15.0 g/dL   HCT 86.0 36 - 46 %   MCV 85.5 80.0 - 100.0 fL   MCH 26.8 26.0 - 34.0 pg   MCHC 31.3 30.0 - 36.0 g/dL   RDW 76.3 77.6 - 49.0 %   Platelets 305 150 - 400 K/uL   nRBC 0.0 0.0 - 0.2 %   Neutrophils Relative % 37 %   Neutro Abs 3.6 1.7  - 7.7 K/uL   Lymphocytes Relative 53 %   Lymphs Abs 5.2 (H) 0.7 - 4.0 K/uL   Monocytes Relative 7 %   Monocytes Absolute 0.7 0 - 1 K/uL   Eosinophils Relative 2 %   Eosinophils Absolute 0.2 0 - 0 K/uL   Basophils Relative 1 %   Basophils Absolute 0.1 0 - 0 K/uL   WBC Morphology ATYPICAL LYMPHOCYTES    Immature Granulocytes 0 %   Abs Immature Granulocytes 0.03 0.00 - 0.07 K/uL    Comment: Performed at [facility], [address], [place], [place] [address]  Comprehensive metabolic panel     Status: Abnormal   Collection Time: 06/08/20  8:00 AM  Result Value Ref Range   Sodium 140 135 - 145 mmol/L   Potassium 4.5 3.5 - 5.1 mmol/L   Chloride 106 98 - 111 mmol/L   CO2 22 22 - 32 mmol/L   Glucose, Bld 100 (H) 70 - 99 mg/dL    Comment: Glucose reference  range applies only to samples taken after fasting for at least 8 hours.   BUN 13 8 - 23 mg/dL   Creatinine, Ser 1.07 (H) 0.44 - 1.00 mg/dL   Calcium 9.1 8.9 - 10.3 mg/dL   Total Protein 6.9 6.5 - 8.1 g/dL   Albumin 3.9 3.5 - 5.0 g/dL   AST 35 15 - 41 U/L   ALT 15 0 - 44 U/L   Alkaline Phosphatase 98 38 - 126 U/L   Total Bilirubin 1.2 0.3 - 1.2 mg/dL   GFR calc non Af Amer 52 (L) >60 mL/min   GFR calc Af Amer >60 >60 mL/min   Anion gap 12 5 - 15    Comment: Performed at [facility], [place] [address] [place], [place] [address]  CBC     Status: Abnormal   Collection Time: 06/09/20  2:56 AM  Result Value Ref Range   WBC 12.1 (H) 4.0 - 10.5 K/uL   RBC 3.22 (L) 3.87 - 5.11 MIL/uL   Hemoglobin 8.6 (L) 12.0 - 15.0 g/dL    Comment: REPEATED TO VERIFY   HCT 27.3 (L) 36 - 46 %   MCV 84.8 80.0 - 100.0 fL   MCH 26.7 26.0 - 34.0 pg   MCHC 31.5 30.0 - 36.0 g/dL   RDW 14.8 11.5 - 15.5 %   Platelets 251 150 - 400 K/uL   nRBC 0.0 0.0 - 0.2 %    Comment: Performed at [facility], [place] [address], [place], [place] [address]  Basic metabolic panel     Status: Abnormal   Collection Time:  06/09/20  2:56 AM  Result Value Ref Range   Sodium 138 135 - 145 mmol/L   Potassium 3.9 3.5 - 5.1 mmol/L   Chloride 106 98 - 111 mmol/L   CO2 24 22 - 32 mmol/L   Glucose, Bld 149 (H) 70 - 99 mg/dL    Comment: Glucose reference range applies only to samples taken after fasting for at least 8 hours.   BUN 13 8 - 23 mg/dL   Creatinine, Ser 0.96 0.44 - 1.00 mg/dL   Calcium 8.4 (L) 8.9 - 10.3 mg/dL   GFR calc non Af Amer 59 (L) >60 mL/min   GFR calc Af Amer >60 >60 mL/min   Anion gap 8 5 - 15    Comment: Performed at [facility], [place] [address], [place], [place] [address]  Basic metabolic panel     Status: Abnormal   Collection Time: 06/12/20  1:11 PM  Result Value Ref Range   Sodium 136 135 - 145 mmol/L   Potassium 3.5 3.5 - 5.1 mmol/L   Chloride 102 98 - 111 mmol/L   CO2 23 22 - 32 mmol/L   Glucose, Bld 116 (H) 70 - 99 mg/dL    Comment: Glucose reference range applies only to samples taken after fasting for at least 8 hours.   BUN 13 8 - 23 mg/dL   Creatinine, Ser 0.85 0.44 - 1.00 mg/dL   Calcium 8.7 (L) 8.9 - 10.3 mg/dL   GFR calc non Af Amer >60 >60 mL/min   GFR calc Af Amer >60 >60 mL/min   Anion gap 11 5 - 15    Comment: Performed at [facility], [place] [place], [place] [address]  Troponin I (High Sensitivity)     Status: None   Collection Time: 06/12/20  1:11 PM  Result Value Ref Range  Troponin I (High Sensitivity) 4 <18 ng/L    Comment: (NOTE) Elevated high sensitivity troponin I (hsTnI) values and significant  changes across serial measurements may suggest ACS but many other  chronic and acute conditions are known to elevate hsTnI results.  Refer to the "Links" section for chest pain algorithms and additional  guidance. Performed at [facility], [address] [place], [place] [address]   CBC     Status: Abnormal   Collection Time: 06/12/20  1:11 PM  Result Value Ref Range   WBC 10.4 4.0 - 10.5 K/uL   RBC 3.63  (L) 3.87 - 5.11 MIL/uL   Hemoglobin 9.8 (L) 12.0 - 15.0 g/dL   HCT 30.9 (L) 36 - 46 %   MCV 85.1 80.0 - 100.0 fL   MCH 27.0 26.0 - 34.0 pg   MCHC 31.7 30.0 - 36.0 g/dL   RDW 15.4 11.5 - 15.5 %   Platelets 312 150 - 400 K/uL   nRBC 0.0 0.0 - 0.2 %    Comment: Performed at [facility], [address] [place], [place] [address]  Urinalysis, Routine w reflex microscopic     Status: Abnormal   Collection Time: 07/11/20 11:38 AM  Result Value Ref Range   Color, Urine YELLOW YELLOW   APPearance HAZY (A) CLEAR   Specific Gravity, Urine 1.015 1.005 - 1.030   pH 8.5 (H) 5.0 - 8.0   Glucose, UA NEGATIVE NEGATIVE mg/dL   Hgb urine dipstick NEGATIVE NEGATIVE   Bilirubin Urine NEGATIVE NEGATIVE   Ketones, ur NEGATIVE NEGATIVE mg/dL   Protein, ur NEGATIVE NEGATIVE mg/dL   Nitrite NEGATIVE NEGATIVE   Leukocytes,Ua NEGATIVE NEGATIVE    Comment: Microscopic not done on urines with negative protein, blood, leukocytes, nitrite, or glucose < 500 mg/dL. Performed at [facility], [address] [place], [place] [address]   Comprehensive metabolic panel     Status: Abnormal   Collection Time: 07/11/20 12:00 PM  Result Value Ref Range   Sodium 139 135 - 145 mmol/L   Potassium 3.9 3.5 - 5.1 mmol/L   Chloride 103 98 - 111 mmol/L   CO2 24 22 - 32 mmol/L   Glucose, Bld 96 70 - 99 mg/dL    Comment: Glucose reference range applies only to samples taken after fasting for at least 8 hours.   BUN 14 8 - 23 mg/dL   Creatinine, Ser 0.95 0.44 - 1.00 mg/dL   Calcium 9.3 8.9 - 10.3 mg/dL   Total Protein 6.3 (L) 6.5 - 8.1 g/dL   Albumin 3.4 (L) 3.5 - 5.0 g/dL   AST 27 15 - 41 U/L   ALT 15 0 - 44 U/L   Alkaline Phosphatase 109 38 - 126 U/L   Total Bilirubin 0.5 0.3 - 1.2 mg/dL   GFR calc non Af Amer 60 (L) >60 mL/min   GFR calc Af Amer >60 >60 mL/min   Anion gap 12 5 - 15    Comment: Performed at [facility], [address] [place], [place] [address]  CBC with  Differential     Status: Abnormal   Collection Time: 07/11/20 12:00 PM  Result Value Ref Range   WBC 9.4 4.0 - 10.5 K/uL   RBC 4.15 3.87 - 5.11 MIL/uL   Hemoglobin 11.1 (L) 12.0 - 15.0 g/dL   HCT 35.8 (L) 36 - 46 %   MCV 86.3 80.0 - 100.0 fL  MCH 26.7 26.0 - 34.0 pg   MCHC 31.0 30.0 - 36.0 g/dL   RDW 50.6 (H) 08.9 - 87.6 %   Platelets 392 150 - 400 K/uL   nRBC 0.0 0.0 - 0.2 %   Neutrophils Relative % 32 %   Neutro Abs 3.0 1.7 - 7.7 K/uL   Lymphocytes Relative 57 %   Lymphs Abs 5.5 (H) 0.7 - 4.0 K/uL   Monocytes Relative 7 %   Monocytes Absolute 0.6 0 - 1 K/uL   Eosinophils Relative 3 %   Eosinophils Absolute 0.3 0 - 0 K/uL   Basophils Relative 1 %   Basophils Absolute 0.1 0 - 0 K/uL   Immature Granulocytes 0 %   Abs Immature Granulocytes 0.02 0.00 - 0.07 K/uL   Abnormal Lymphocytes Present PRESENT    Ovalocytes PRESENT    Giant PLTs PRESENT     Comment: Performed at [facility], [address] [place], [place] [address]  SARS Coronavirus 2 by RT PCR (hospital order, performed in [facility] hospital lab) Nasopharyngeal Nasopharyngeal Swab     Status: None   Collection Time: 07/11/20 12:00 PM   Specimen: Nasopharyngeal Swab  Result Value Ref Range   SARS Coronavirus 2 NEGATIVE NEGATIVE    Comment: (NOTE) SARS-CoV-2 target nucleic acids are NOT DETECTED.  The SARS-CoV-2 RNA is generally detectable in upper and lower respiratory specimens during the acute phase of infection. The lowest concentration of SARS-CoV-2 viral copies this assay can detect is 250 copies / mL. A negative result does not preclude SARS-CoV-2 infection and should not be used as the sole basis for treatment or other patient management decisions.  A negative result may occur with improper specimen collection / handling, submission of specimen other than nasopharyngeal swab, presence of viral mutation(s) within the areas targeted by this assay, and inadequate number of viral copies (<250  copies / mL). A negative result must be combined with clinical observations, patient history, and epidemiological information.  Fact Sheet for Patients:   BoilerBrush.com.cy  Fact Sheet for Healthcare Providers: https://pope.com/  This test is not yet approved or  cleared by the [place] FDA and has been authorized for detection and/or diagnosis of SARS-CoV-2 by FDA under an Emergency Use Authorization (EUA).  This EUA will remain in effect (meaning this test can be used) for the duration of the COVID-19 declaration under Section 564(b)(1) of the Act, 21 U.S.C. section 360bbb-3(b)(1), unless the authorization is terminated or revoked sooner.  Performed at [facility], [address] [place], [place] [address]   Pathologist smear review     Status: None   Collection Time: 07/11/20 12:00 PM  Result Value Ref Range   Path Review      ABSOLUTE LYMPHOCYTOSIS WITH MATURE REACTIVE LYMPHOCYTES AND LGL    Comment: Reviewed by Alvis Lemmings L. Charm Barges, M.D. [id number] Performed at [facility], [address] [place], [place] [address]   Protime-INR     Status: Abnormal   Collection Time: 07/12/20  4:53 AM  Result Value Ref Range   Prothrombin Time 19.1 (H) 11.4 - 15.2 seconds   INR 1.7 (H) 0.8 - 1.2    Comment: (NOTE) INR goal varies based on device and disease states. Performed at [facility], [address], [place], [place] [address]   APTT     Status: None   Collection Time: 07/12/20  4:53 AM  Result Value Ref Range   aPTT 33  24 - 36 seconds    Comment: Performed at [facility], [place] [address] [place], [place] [address]  ECHOCARDIOGRAM COMPLETE     Status: None   Collection Time: 07/12/20  3:40 PM  Result Value Ref Range   Weight 2,848 oz   Height 62 in   BP 155/69 mmHg   S' Lateral 2.70 cm   Area-P 1/2 2.83 cm2  TSH     Status: None   Collection Time: 07/13/20 11:38 AM  Result Value Ref Range     TSH 1.616 0.350 - 4.500 uIU/mL    Comment: Performed by a 3rd Generation assay with a functional sensitivity of <=0.01 uIU/mL. Performed at [facility], [place] [address], [place], [place] [address]   Vitamin B12     Status: None   Collection Time: 07/13/20 11:38 AM  Result Value Ref Range   Vitamin B-12 341 180 - 914 pg/mL    Comment: (NOTE) This assay is not validated for testing neonatal or myeloproliferative syndrome specimens for Vitamin B12 levels. Performed at [facility], [place] [address] [place], [place] [address]   Folate     Status: None   Collection Time: 07/13/20 11:38 AM  Result Value Ref Range   Folate 15.2 >5.9 ng/mL    Comment: Performed at [place] [address], [place], [place] [address]  CBC w/Diff     Status: Abnormal   Collection Time: 07/22/20 10:49 AM  Result Value Ref Range   WBC 9.3 4.0 - 10.5 K/uL   RBC 4.26 3.87 - 5.11 Mil/uL   Hemoglobin 11.5 (L) 12.0 - 15.0 g/dL   HCT 35.7 (L) 36 - 46 %   MCV 83.7 78.0 - 100.0 fl   MCHC 32.1 30.0 - 36.0 g/dL   RDW 17.4 (H) 11.5 - 15.5 %   Platelets 416.0 (H) 150 - 400 K/uL   Neutrophils Relative % 41.3 (L) 43 - 77 %    Comment: A Manual Differential was performed and is consistent with the Automated Differential.   Lymphocytes Relative 50.2 (H) 12 - 46 %   Monocytes Relative 5.3 3 - 12 %   Eosinophils Relative 2.0 0 - 5 %   Basophils Relative 1.2 0 - 3 %   Neutro Abs 3.8 1.4 - 7.7 K/uL   Lymphs Abs 4.7 (H) 0.7 - 4.0 K/uL   Monocytes Absolute 0.5 0 - 1 K/uL   Eosinophils Absolute 0.2 0 - 0 K/uL   Basophils Absolute 0.1 0 - 0 K/uL  Comp Met (CMET)     Status: Abnormal   Collection Time: 07/22/20 10:49 AM  Result Value Ref Range   Sodium 138 135 - 145 mEq/L   Potassium 4.1 3.5 - 5.1 mEq/L   Chloride 102 96 - 112 mEq/L   CO2 26 19 - 32 mEq/L   Glucose, Bld 115 (H) 70 - 99 mg/dL   BUN 14 6 - 23 mg/dL   Creatinine, Ser 1.00 0.40 - 1.20 mg/dL   Total Bilirubin 0.4 0.2 - 1.2 mg/dL    Alkaline Phosphatase 123 (H) 39 - 117 U/L   AST 23 0 - 37 U/L   ALT 11 0 - 35 U/L   Total Protein 7.0 6.0 - 8.3 g/dL   Albumin 4.2 3.5 - 5.2 g/dL   GFR 54.43 (L) >60.00 mL/min   Calcium 9.8 8.4 - 10.5 mg/dL  Hemoglobin A1c     Status: None   Collection Time:  07/22/20 10:49 AM  Result Value Ref Range   Hgb A1c MFr Bld 5.8 4.6 - 6.5 %    Comment: Glycemic Control Guidelines for People with Diabetes:Non Diabetic:  <6%Goal of Therapy: <7%Additional Action Suggested:  >8%   Lipid Profile     Status: Abnormal   Collection Time: 07/22/20 10:49 AM  Result Value Ref Range   Cholesterol 203 (H) 0 - 200 mg/dL    Comment: ATP III Classification       Desirable:  < 200 mg/dL               Borderline High:  200 - 239 mg/dL          High:  > = 240 mg/dL   Triglycerides 64.0 0 - 149 mg/dL    Comment: Normal:  <150 mg/dLBorderline High:  150 - 199 mg/dL   HDL 92.60 >39.00 mg/dL   VLDL 12.8 0.0 - 40.0 mg/dL   LDL Cholesterol 98 0 - 99 mg/dL   Total CHOL/HDL Ratio 2     Comment:                Men          Women1/2 Average Risk     3.4          3.3Average Risk          5.0          4.42X Average Risk          9.6          7.13X Average Risk          15.0          11.0                       NonHDL 110.57     Comment: NOTE:  Non-HDL goal should be 30 mg/dL higher than patient's LDL goal (i.e. LDL goal of < 70 mg/dL, would have non-HDL goal of < 100 mg/dL)  Iron, TIBC and Ferritin Panel     Status: Abnormal   Collection Time: 07/22/20 10:49 AM  Result Value Ref Range   Iron 61 45 - 160 mcg/dL   TIBC 424 250 - 450 mcg/dL (calc)   %SAT 14 (L) 16 - 45 % (calc)   Ferritin 64 16 - 288 ng/mL  CBC with Differential (Cancer Center Only)     Status: Abnormal   Collection Time: 07/27/20  8:12 AM  Result Value Ref Range   WBC Count 9.5 4.0 - 10.5 K/uL   RBC 4.48 3.87 - 5.11 MIL/uL   Hemoglobin 11.9 (L) 12.0 - 15.0 g/dL   HCT 38.4 36 - 46 %   MCV 85.7 80.0 - 100.0 fL   MCH 26.6 26.0 - 34.0 pg   MCHC 31.0  30.0 - 36.0 g/dL   RDW 15.6 (H) 11.5 - 15.5 %   Platelet Count 407 (H) 150 - 400 K/uL   nRBC 0.0 0.0 - 0.2 %   Neutrophils Relative % 34 %   Neutro Abs 3.2 1.7 - 7.7 K/uL   Lymphocytes Relative 54 %   Lymphs Abs 5.2 (H) 0.7 - 4.0 K/uL   Monocytes Relative 7 %   Monocytes Absolute 0.6 0 - 1 K/uL   Eosinophils Relative 4 %   Eosinophils Absolute 0.3 0 - 0 K/uL   Basophils Relative 1 %   Basophils Absolute 0.1 0 - 0 K/uL   Immature Granulocytes 0 %  Abs Immature Granulocytes 0.02 0.00 - 0.07 K/uL    Comment: Performed at [facility] at [facility], [address] [place], [place] [address]  CMP (Cancer Center only)     Status: Abnormal   Collection Time: 07/27/20  8:12 AM  Result Value Ref Range   Sodium 140 135 - 145 mmol/L   Potassium 5.1 3.5 - 5.1 mmol/L   Chloride 104 98 - 111 mmol/L   CO2 29 22 - 32 mmol/L   Glucose, Bld 114 (H) 70 - 99 mg/dL    Comment: Glucose reference range applies only to samples taken after fasting for at least 8 hours.   BUN 9 8 - 23 mg/dL   Creatinine 2.55 (H) 0.01 - 1.00 mg/dL   Calcium 64.2 (H) 8.9 - 10.3 mg/dL   Total Protein 7.1 6.5 - 8.1 g/dL   Albumin 4.2 3.5 - 5.0 g/dL   AST 23 15 - 41 U/L   ALT 11 0 - 44 U/L   Alkaline Phosphatase 110 38 - 126 U/L   Total Bilirubin 0.5 0.3 - 1.2 mg/dL   GFR, Est Non Af Am 51 (L) >60 mL/min   GFR, Est AFR Am 59 (L) >60 mL/min   Anion gap 7 5 - 15    Comment: Performed at [facility] at [facility], [address] [place], [place] [address]  Gamma GT     Status: None   Collection Time: 08/05/20 11:15 AM  Result Value Ref Range   GGT 22 7 - 51 U/L  VITAMIN D 25 Hydroxy (Vit-D Deficiency, Fractures)     Status: None   Collection Time: 08/05/20 11:15 AM  Result Value Ref Range   VITD 67.61 30.00 - 100.00 ng/mL  IBC panel     Status: Abnormal   Collection Time: 08/05/20 11:15 AM  Result Value Ref Range   Iron 45 42 - 145 ug/dL   Transferrin 558.3  167.4 - 360.0 mg/dL   Saturation Ratios 25.5 (L) 20.0 - 50.0 %  CBC with Differential/Platelet     Status: Abnormal   Collection Time: 08/05/20 11:15 AM  Result Value Ref Range   WBC 7.7 4.0 - 10.5 K/uL   RBC 4.48 3.87 - 5.11 Mil/uL   Hemoglobin 12.1 12.0 - 15.0 g/dL   HCT 25.8 36 - 46 %   MCV 84.1 78.0 - 100.0 fl   MCHC 32.0 30.0 - 36.0 g/dL   RDW 94.8 (H) 34.7 - 58.3 %   Platelets 360.0 150 - 400 K/uL   Neutrophils Relative % 35.8 (L) 43 - 77 %   Lymphocytes Relative 53.8 (H) 12 - 46 %   Monocytes Relative 6.2 3 - 12 %   Eosinophils Relative 2.5 0 - 5 %   Basophils Relative 1.7 0 - 3 %   Neutro Abs 2.8 1.4 - 7.7 K/uL   Lymphs Abs 4.1 (H) 0.7 - 4.0 K/uL   Monocytes Absolute 0.5 0 - 1 K/uL   Eosinophils Absolute 0.2 0 - 0 K/uL   Basophils Absolute 0.1 0 - 0 K/uL    Assessment/Plan: 1. Controlled type 2 diabetes mellitus with complication, without long-term current use of insulin (HCC) Taking medications as directed. Will obtain CMP, Lipids and A1C to assess current glycemic control and risk stratify. Will adjust medication regimen accordingly. Reviewed need for yearly eye examination and foot examination.  Will get records of immunizations. - Comp Met (CMET) - Hemoglobin  A1c - Lipid Profile  2. Essential hypertension BP stable today. Asymptomatic. Continue current regimen.   3. Hypothyroidism, unspecified type Taking medication as directed. Continue current regimen.   Lab Results  Component Value Date   TSH 1.616 07/13/2020   4. Restless legs syndrome (RLS) WIll check CBC and iron levels to see if this is contributing. Lyrica as directed. Can consider addition of Requip.  - CBC w/Diff - Iron, TIBC and Ferritin Panel  5. Gastroesophageal reflux disease, unspecified whether esophagitis present Cntinue Protonix, adding on Famotidine 20 mg nightly. Dietary recommendations reviewed. Follow-up discussed.   6. Memory changes On Aricept. Will have her have follow-up with  Psychiatry and continue management per her Neurologist.   This visit occurred during the SARS-CoV-2 public health emergency.  Safety protocols were in place, including screening questions prior to the visit, additional usage of staff PPE, and extensive cleaning of exam room while observing appropriate contact time as indicated for disinfecting solutions.     Leeanne Rio, PA-C

## 2020-07-23 LAB — IRON,TIBC AND FERRITIN PANEL
%SAT: 14 % (calc) — ABNORMAL LOW (ref 16–45)
Ferritin: 64 ng/mL (ref 16–288)
Iron: 61 ug/dL (ref 45–160)
TIBC: 424 mcg/dL (calc) (ref 250–450)

## 2020-07-27 ENCOUNTER — Inpatient Hospital Stay: Payer: Medicare Other | Attending: Hematology & Oncology

## 2020-07-27 ENCOUNTER — Encounter: Payer: Self-pay | Admitting: Hematology & Oncology

## 2020-07-27 ENCOUNTER — Other Ambulatory Visit: Payer: Self-pay

## 2020-07-27 ENCOUNTER — Inpatient Hospital Stay (HOSPITAL_BASED_OUTPATIENT_CLINIC_OR_DEPARTMENT_OTHER): Payer: Medicare Other | Admitting: Hematology & Oncology

## 2020-07-27 VITALS — BP 157/71 | HR 81 | Temp 97.6°F | Resp 18 | Wt 174.0 lb

## 2020-07-27 DIAGNOSIS — I2699 Other pulmonary embolism without acute cor pulmonale: Secondary | ICD-10-CM | POA: Diagnosis not present

## 2020-07-27 DIAGNOSIS — Z79899 Other long term (current) drug therapy: Secondary | ICD-10-CM | POA: Insufficient documentation

## 2020-07-27 DIAGNOSIS — Z7901 Long term (current) use of anticoagulants: Secondary | ICD-10-CM | POA: Diagnosis not present

## 2020-07-27 DIAGNOSIS — D6862 Lupus anticoagulant syndrome: Secondary | ICD-10-CM | POA: Insufficient documentation

## 2020-07-27 DIAGNOSIS — Z86711 Personal history of pulmonary embolism: Secondary | ICD-10-CM | POA: Diagnosis not present

## 2020-07-27 DIAGNOSIS — I2601 Septic pulmonary embolism with acute cor pulmonale: Secondary | ICD-10-CM

## 2020-07-27 DIAGNOSIS — M329 Systemic lupus erythematosus, unspecified: Secondary | ICD-10-CM | POA: Diagnosis present

## 2020-07-27 DIAGNOSIS — I82561 Chronic embolism and thrombosis of right calf muscular vein: Secondary | ICD-10-CM | POA: Insufficient documentation

## 2020-07-27 DIAGNOSIS — Z886 Allergy status to analgesic agent status: Secondary | ICD-10-CM | POA: Diagnosis not present

## 2020-07-27 LAB — CMP (CANCER CENTER ONLY)
ALT: 11 U/L (ref 0–44)
AST: 23 U/L (ref 15–41)
Albumin: 4.2 g/dL (ref 3.5–5.0)
Alkaline Phosphatase: 110 U/L (ref 38–126)
Anion gap: 7 (ref 5–15)
BUN: 9 mg/dL (ref 8–23)
CO2: 29 mmol/L (ref 22–32)
Calcium: 10.4 mg/dL — ABNORMAL HIGH (ref 8.9–10.3)
Chloride: 104 mmol/L (ref 98–111)
Creatinine: 1.09 mg/dL — ABNORMAL HIGH (ref 0.44–1.00)
GFR, Est AFR Am: 59 mL/min — ABNORMAL LOW (ref >60)
GFR, Estimated: 51 mL/min — ABNORMAL LOW (ref >60)
Glucose, Bld: 114 mg/dL — ABNORMAL HIGH (ref 70–99)
Potassium: 5.1 mmol/L (ref 3.5–5.1)
Sodium: 140 mmol/L (ref 135–145)
Total Bilirubin: 0.5 mg/dL (ref 0.3–1.2)
Total Protein: 7.1 g/dL (ref 6.5–8.1)

## 2020-07-27 LAB — CBC WITH DIFFERENTIAL (CANCER CENTER ONLY)
Abs Immature Granulocytes: 0.02 10*3/uL (ref 0.00–0.07)
Basophils Absolute: 0.1 10*3/uL (ref 0.0–0.1)
Basophils Relative: 1 %
Eosinophils Absolute: 0.3 10*3/uL (ref 0.0–0.5)
Eosinophils Relative: 4 %
HCT: 38.4 % (ref 36.0–46.0)
Hemoglobin: 11.9 g/dL — ABNORMAL LOW (ref 12.0–15.0)
Immature Granulocytes: 0 %
Lymphocytes Relative: 54 %
Lymphs Abs: 5.2 10*3/uL — ABNORMAL HIGH (ref 0.7–4.0)
MCH: 26.6 pg (ref 26.0–34.0)
MCHC: 31 g/dL (ref 30.0–36.0)
MCV: 85.7 fL (ref 80.0–100.0)
Monocytes Absolute: 0.6 10*3/uL (ref 0.1–1.0)
Monocytes Relative: 7 %
Neutro Abs: 3.2 10*3/uL (ref 1.7–7.7)
Neutrophils Relative %: 34 %
Platelet Count: 407 10*3/uL — ABNORMAL HIGH (ref 150–400)
RBC: 4.48 MIL/uL (ref 3.87–5.11)
RDW: 15.6 % — ABNORMAL HIGH (ref 11.5–15.5)
WBC Count: 9.5 10*3/uL (ref 4.0–10.5)
nRBC: 0 % (ref 0.0–0.2)

## 2020-07-27 NOTE — Progress Notes (Signed)
Hematology and Oncology Follow Up Visit  [name] [id number] 14-Jan-1946 74 y.o. 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 q day -- maintanence on 09/06/2018  EC ASA 81 mg po q day     Interim History:  [name] is back for follow-up.  The good news is that she has surgery on her right knee.  This was back in June.  She had the right knee replaced.  She really is doing well with this.  The not so good news is that she has some neurological event last week.  Still not sure exactly what it was.  She has had that she got weak.  She said that her face flattened.  She cannot talk.  This lasted about 5 minutes.  She is brought to the emergency room.  She had a MRI of the brain done.  This was unremarkable.  She had an echocardiogram that was done.  This also seem to be okay.  She has been dedicated to taking her Xarelto and aspirin.  She is on low-dose/maintenance Xarelto.  Again is hard to say what exactly happened.  I know we have been checking her lupus anticoagulant.  In the last time we checked it there was not a lupus anticoagulant present.  She looks great.  She is talked without any problems.  She is no weakness.  She does have the breathing issues.  These really have not worsened despite the hot humid weather.  Currently, I would say her performance status is ECOG 1.  .   Medications:  Current Outpatient Medications:  .  acetaminophen (ACETAMINOPHEN 8 HOUR) 650 MG CR tablet, Take 650 mg by mouth every 8 (eight) hours as needed for pain., Disp: , Rfl:  .  albuterol (PROAIR HFA) 108 (90 BASE) MCG/ACT inhaler, Inhale 2 puffs into the lungs every 4 (four) hours as needed for wheezing or shortness of breath., Disp: 2 Inhaler, Rfl: prn .  albuterol (PROVENTIL) (2.5 MG/3ML) 0.083% nebulizer solution, Take 3 mLs (2.5 mg  total) by nebulization every 6 (six) hours as needed for wheezing or shortness of breath., Disp: 1080 mL, Rfl: 3 .  amLODipine (NORVASC) 10 MG tablet, Take 10 mg by mouth daily. , Disp: , Rfl:  .  aspirin EC 81 MG tablet, Take 81 mg by mouth daily. Swallow whole., Disp: , Rfl:  .  budesonide (PULMICORT) 0.25 MG/2ML nebulizer solution, Take 2 mLs (0.25 mg total) by nebulization 2 (two) times daily. Dx: J45.20, Disp: 120 mL, Rfl: 5 .  buPROPion (WELLBUTRIN XL) 150 MG 24 hr tablet, Take 150 mg by mouth daily., Disp: , Rfl:  .  co-enzyme Q-10 30 MG capsule, Take 30 mg by mouth 3 (three) times daily. Gummies, Disp: , Rfl:  .  donepezil (ARICEPT) 5 MG tablet, Take 5 mg by mouth at bedtime., Disp: , Rfl:  .  famotidine (PEPCID) 20 MG tablet, Take 1 tablet (20 mg total) by mouth at bedtime., Disp: 30 tablet, Rfl: 1 .  fluticasone (FLONASE) 50 MCG/ACT nasal spray, Place 1 spray into both nostrils daily., Disp: , Rfl:  .  Fluticasone-Salmeterol (ADVAIR DISKUS) 250-50 MCG/DOSE AEPB, Inhale 3 puffs into the lungs daily., Disp: , Rfl:  .  latanoprost (XALATAN) 0.005 % ophthalmic solution, Place 1 drop into both eyes at bedtime. , Disp: , Rfl: 2 .  levothyroxine (SYNTHROID, LEVOTHROID)  50 MCG tablet, Take 50 mcg by mouth daily before breakfast. , Disp: , Rfl:  .  montelukast (SINGULAIR) 10 MG tablet, Take 10 mg by mouth at bedtime., Disp: , Rfl:  .  pantoprazole (PROTONIX) 40 MG tablet, Take 40 mg by mouth daily. , Disp: , Rfl: 0 .  pregabalin (LYRICA) 75 MG capsule, Take 75 mg by mouth 2 (two) times daily., Disp: , Rfl:  .  rOPINIRole (REQUIP) 2 MG tablet, Take 2 mg by mouth at bedtime. , Disp: , Rfl: 1 .  senna (SENOKOT) 8.6 MG TABS tablet, Take 8.6 mg by mouth at bedtime. , Disp: , Rfl:  .  venlafaxine XR (EFFEXOR-XR) 75 MG 24 hr capsule, Take 150 mg by mouth daily with breakfast. , Disp: , Rfl:  .  Vitamin D, Ergocalciferol, (DRISDOL) 50000 units CAPS capsule, Take 50,000 Units by mouth once a week., Disp: ,  Rfl:  .  XARELTO 10 MG TABS tablet, Take 1 tablet (10 mg total) by mouth daily with supper. (Patient taking differently: Take 10 mg by mouth daily. ), Disp: 30 tablet, Rfl: 12  Allergies:  Allergies  Allergen Reactions  . Naproxen Shortness Of Breath, Itching and Swelling  . Tizanidine Shortness Of Breath    dizziness    Past Medical History, Surgical history, Social history, and Family History were reviewed and updated.  Review of Systems: Review of Systems  Constitutional: Negative.   HENT:  Negative.   Eyes: Negative.   Respiratory: Negative.   Cardiovascular: Negative.   Gastrointestinal: Negative.   Endocrine: Negative.   Genitourinary: Negative.    Musculoskeletal: Negative.   Skin: Negative.   Neurological: Negative.   Hematological: Negative.   Psychiatric/Behavioral: Negative.     Physical Exam:  weight is 174 lb (78.9 kg). Her oral temperature is 97.6 F (36.4 C). Her blood pressure is 157/71 (abnormal) and her pulse is 81. Her respiration is 18 and oxygen saturation is 100%.   Wt Readings from Last 3 Encounters:  07/27/20 174 lb (78.9 kg)  07/22/20 176 lb (79.8 kg)  07/21/20 176 lb 6.4 oz (80 kg)    Physical Exam Vitals reviewed.  HENT:     Head: Normocephalic and atraumatic.  Eyes:     Pupils: Pupils are equal, round, and reactive to light.  Cardiovascular:     Rate and Rhythm: Normal rate and regular rhythm.     Heart sounds: Normal heart sounds.  Pulmonary:     Effort: Pulmonary effort is normal.     Breath sounds: Normal breath sounds.  Abdominal:     General: Bowel sounds are normal.     Palpations: Abdomen is soft.  Musculoskeletal:        General: No tenderness or deformity. Normal range of motion.     Cervical back: Normal range of motion.  Lymphadenopathy:     Cervical: No cervical adenopathy.  Skin:    General: Skin is warm and dry.     Findings: No erythema or rash.  Neurological:     Mental Status: She is alert and oriented to  person, place, and time.  Psychiatric:        Behavior: Behavior normal.        Thought Content: Thought content normal.        Judgment: Judgment normal.      Lab Results  Component Value Date   WBC 9.5 07/27/2020   HGB 11.9 (L) 07/27/2020   HCT 38.4 07/27/2020   MCV 85.7 07/27/2020  PLT 407 (H) 07/27/2020     Chemistry      Component Value Date/Time   NA 140 07/27/2020 0812   NA 141 11/03/2015 1117   K 5.1 07/27/2020 0812   CL 104 07/27/2020 0812   CO2 29 07/27/2020 0812   BUN 9 07/27/2020 0812   BUN 22 11/03/2015 1117   CREATININE 1.09 (H) 07/27/2020 0812      Component Value Date/Time   CALCIUM 10.4 (H) 07/27/2020 0812   ALKPHOS 110 07/27/2020 0812   AST 23 07/27/2020 0812   ALT 11 07/27/2020 0812   BILITOT 0.5 07/27/2020 0812       Impression and Plan: [name] is a 74 year old white female with a pulmonary emboli and lower extremity thromboembolic disease.  She has a transiently  positive lupus anticoagulant.  Last time we checked her lupus anticoagulant back in March, it was negative.  I am a little disappointed about this neurological event.  Again is hard to say if this was a TIA.  All of her studies appear to be unremarkable.  She is going to see a neurologist.  For right now, I would not change her Xarelto or her aspirin.  We will have to see what the neurologist has to say.  I would like to have her come back in 2 months just a week and follow-up with her given the situation that she just had.        Volanda Napoleon, MD 8/9/20218:52 AM

## 2020-07-29 ENCOUNTER — Other Ambulatory Visit: Payer: Self-pay | Admitting: Emergency Medicine

## 2020-07-29 DIAGNOSIS — D619 Aplastic anemia, unspecified: Secondary | ICD-10-CM

## 2020-07-29 DIAGNOSIS — R748 Abnormal levels of other serum enzymes: Secondary | ICD-10-CM

## 2020-07-30 ENCOUNTER — Ambulatory Visit: Payer: PRIVATE HEALTH INSURANCE

## 2020-08-05 ENCOUNTER — Other Ambulatory Visit: Payer: Self-pay

## 2020-08-05 ENCOUNTER — Ambulatory Visit (INDEPENDENT_AMBULATORY_CARE_PROVIDER_SITE_OTHER): Payer: Medicare Other

## 2020-08-05 DIAGNOSIS — R748 Abnormal levels of other serum enzymes: Secondary | ICD-10-CM

## 2020-08-05 DIAGNOSIS — D619 Aplastic anemia, unspecified: Secondary | ICD-10-CM | POA: Diagnosis not present

## 2020-08-05 LAB — VITAMIN D 25 HYDROXY (VIT D DEFICIENCY, FRACTURES): VITD: 67.61 ng/mL (ref 30.00–100.00)

## 2020-08-05 LAB — CBC WITH DIFFERENTIAL/PLATELET
Basophils Absolute: 0.1 10*3/uL (ref 0.0–0.1)
Basophils Relative: 1.7 % (ref 0.0–3.0)
Eosinophils Absolute: 0.2 10*3/uL (ref 0.0–0.7)
Eosinophils Relative: 2.5 % (ref 0.0–5.0)
HCT: 37.7 % (ref 36.0–46.0)
Hemoglobin: 12.1 g/dL (ref 12.0–15.0)
Lymphocytes Relative: 53.8 % — ABNORMAL HIGH (ref 12.0–46.0)
Lymphs Abs: 4.1 10*3/uL — ABNORMAL HIGH (ref 0.7–4.0)
MCHC: 32 g/dL (ref 30.0–36.0)
MCV: 84.1 fl (ref 78.0–100.0)
Monocytes Absolute: 0.5 10*3/uL (ref 0.1–1.0)
Monocytes Relative: 6.2 % (ref 3.0–12.0)
Neutro Abs: 2.8 10*3/uL (ref 1.4–7.7)
Neutrophils Relative %: 35.8 % — ABNORMAL LOW (ref 43.0–77.0)
Platelets: 360 10*3/uL (ref 150.0–400.0)
RBC: 4.48 Mil/uL (ref 3.87–5.11)
RDW: 16.5 % — ABNORMAL HIGH (ref 11.5–15.5)
WBC: 7.7 10*3/uL (ref 4.0–10.5)

## 2020-08-05 LAB — IBC PANEL
Iron: 45 ug/dL (ref 42–145)
Saturation Ratios: 11.2 % — ABNORMAL LOW (ref 20.0–50.0)
Transferrin: 287 mg/dL (ref 212.0–360.0)

## 2020-08-05 LAB — GAMMA GT: GGT: 22 U/L (ref 7–51)

## 2020-08-10 ENCOUNTER — Other Ambulatory Visit: Payer: Self-pay | Admitting: Emergency Medicine

## 2020-08-10 DIAGNOSIS — D509 Iron deficiency anemia, unspecified: Secondary | ICD-10-CM

## 2020-08-10 DIAGNOSIS — R748 Abnormal levels of other serum enzymes: Secondary | ICD-10-CM

## 2020-08-10 MED ORDER — FERROUS SULFATE 324 (65 FE) MG PO TBEC: 1.0000 | DELAYED_RELEASE_TABLET | Freq: Every day | ORAL | 3 refills | Status: DC

## 2020-08-11 ENCOUNTER — Ambulatory Visit
Admission: RE | Admit: 2020-08-11 | Discharge: 2020-08-11 | Disposition: A | Discharge status: Home / Self Care | Payer: Medicare Other | Source: Ambulatory Visit | Attending: Physician Assistant | Admitting: Physician Assistant

## 2020-08-11 ENCOUNTER — Other Ambulatory Visit: Payer: Self-pay

## 2020-08-11 DIAGNOSIS — Z1231 Encounter for screening mammogram for malignant neoplasm of breast: Secondary | ICD-10-CM

## 2020-08-21 ENCOUNTER — Other Ambulatory Visit: Payer: Self-pay | Admitting: Physician Assistant

## 2020-08-27 ENCOUNTER — Other Ambulatory Visit: Payer: Self-pay

## 2020-08-27 ENCOUNTER — Ambulatory Visit: Admission: RE | Admit: 2020-08-27 | Payer: Medicare Other | Source: Ambulatory Visit

## 2020-09-07 ENCOUNTER — Ambulatory Visit: Payer: Medicare Other

## 2020-09-09 ENCOUNTER — Ambulatory Visit: Payer: Medicare Other

## 2020-09-09 ENCOUNTER — Other Ambulatory Visit: Payer: Medicare Other

## 2020-09-10 ENCOUNTER — Ambulatory Visit
Admission: RE | Admit: 2020-09-10 | Discharge: 2020-09-10 | Disposition: A | Discharge status: Home / Self Care | Payer: Medicare Other | Source: Ambulatory Visit | Attending: Physician Assistant | Admitting: Physician Assistant

## 2020-09-10 ENCOUNTER — Other Ambulatory Visit: Payer: Self-pay

## 2020-09-14 ENCOUNTER — Other Ambulatory Visit: Payer: Self-pay | Admitting: Physician Assistant

## 2020-09-14 NOTE — Telephone Encounter (Signed)
Lyrica last filled 2 years ago by a historical provider. Please advise.

## 2020-09-17 ENCOUNTER — Institutional Professional Consult (permissible substitution): Payer: Medicare Other | Admitting: Neurology

## 2020-09-18 ENCOUNTER — Encounter: Payer: Self-pay | Admitting: Hematology & Oncology

## 2020-09-18 ENCOUNTER — Inpatient Hospital Stay: Payer: Medicare Other

## 2020-09-18 ENCOUNTER — Inpatient Hospital Stay: Payer: Medicare Other | Attending: Hematology & Oncology | Admitting: Hematology & Oncology

## 2020-09-18 ENCOUNTER — Other Ambulatory Visit: Payer: Self-pay

## 2020-09-18 VITALS — BP 163/66 | HR 87 | Temp 98.2°F | Resp 18 | Wt 165.0 lb

## 2020-09-18 DIAGNOSIS — I2699 Other pulmonary embolism without acute cor pulmonale: Secondary | ICD-10-CM

## 2020-09-18 DIAGNOSIS — Z79899 Other long term (current) drug therapy: Secondary | ICD-10-CM | POA: Diagnosis not present

## 2020-09-18 DIAGNOSIS — I82561 Chronic embolism and thrombosis of right calf muscular vein: Secondary | ICD-10-CM | POA: Diagnosis present

## 2020-09-18 DIAGNOSIS — G459 Transient cerebral ischemic attack, unspecified: Secondary | ICD-10-CM | POA: Diagnosis not present

## 2020-09-18 DIAGNOSIS — Z7982 Long term (current) use of aspirin: Secondary | ICD-10-CM | POA: Diagnosis not present

## 2020-09-18 DIAGNOSIS — Z7901 Long term (current) use of anticoagulants: Secondary | ICD-10-CM | POA: Diagnosis not present

## 2020-09-18 DIAGNOSIS — D6862 Lupus anticoagulant syndrome: Secondary | ICD-10-CM | POA: Diagnosis not present

## 2020-09-18 DIAGNOSIS — M329 Systemic lupus erythematosus, unspecified: Secondary | ICD-10-CM | POA: Insufficient documentation

## 2020-09-18 DIAGNOSIS — R634 Abnormal weight loss: Secondary | ICD-10-CM | POA: Insufficient documentation

## 2020-09-18 DIAGNOSIS — M1711 Unilateral primary osteoarthritis, right knee: Secondary | ICD-10-CM | POA: Diagnosis not present

## 2020-09-18 DIAGNOSIS — K5900 Constipation, unspecified: Secondary | ICD-10-CM | POA: Diagnosis not present

## 2020-09-18 DIAGNOSIS — Z886 Allergy status to analgesic agent status: Secondary | ICD-10-CM | POA: Diagnosis not present

## 2020-09-18 LAB — CMP (CANCER CENTER ONLY)
ALT: 11 U/L (ref 0–44)
AST: 18 U/L (ref 15–41)
Albumin: 4.2 g/dL (ref 3.5–5.0)
Alkaline Phosphatase: 108 U/L (ref 38–126)
Anion gap: 8 (ref 5–15)
BUN: 15 mg/dL (ref 8–23)
CO2: 30 mmol/L (ref 22–32)
Calcium: 10.2 mg/dL (ref 8.9–10.3)
Chloride: 103 mmol/L (ref 98–111)
Creatinine: 1.01 mg/dL — ABNORMAL HIGH (ref 0.44–1.00)
GFR, Est AFR Am: >60 mL/min (ref >60)
GFR, Estimated: 56 mL/min — ABNORMAL LOW (ref >60)
Glucose, Bld: 89 mg/dL (ref 70–99)
Potassium: 4 mmol/L (ref 3.5–5.1)
Sodium: 141 mmol/L (ref 135–145)
Total Bilirubin: 0.5 mg/dL (ref 0.3–1.2)
Total Protein: 7.2 g/dL (ref 6.5–8.1)

## 2020-09-18 LAB — CBC WITH DIFFERENTIAL (CANCER CENTER ONLY)
Abs Immature Granulocytes: 0.02 10*3/uL (ref 0.00–0.07)
Basophils Absolute: 0.1 10*3/uL (ref 0.0–0.1)
Basophils Relative: 1 %
Eosinophils Absolute: 0.1 10*3/uL (ref 0.0–0.5)
Eosinophils Relative: 1 %
HCT: 40.2 % (ref 36.0–46.0)
Hemoglobin: 12.6 g/dL (ref 12.0–15.0)
Immature Granulocytes: 0 %
Lymphocytes Relative: 64 %
Lymphs Abs: 7.5 10*3/uL — ABNORMAL HIGH (ref 0.7–4.0)
MCH: 26.4 pg (ref 26.0–34.0)
MCHC: 31.3 g/dL (ref 30.0–36.0)
MCV: 84.1 fL (ref 80.0–100.0)
Monocytes Absolute: 0.8 10*3/uL (ref 0.1–1.0)
Monocytes Relative: 6 %
Neutro Abs: 3.3 10*3/uL (ref 1.7–7.7)
Neutrophils Relative %: 28 %
Platelet Count: 362 10*3/uL (ref 150–400)
RBC: 4.78 MIL/uL (ref 3.87–5.11)
RDW: 15.5 % (ref 11.5–15.5)
WBC Count: 11.7 10*3/uL — ABNORMAL HIGH (ref 4.0–10.5)
nRBC: 0 % (ref 0.0–0.2)

## 2020-09-18 NOTE — Progress Notes (Signed)
Hematology and Oncology Follow Up Visit  [name] [id number] 01/04/1946 74 y.o. 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 q day -- maintanence on 09/06/2018  EC ASA 81 mg po q day     Interim History:  [name] is back for follow-up.  Overall, she looks great.  She feels good.  She had her right knee operated on for arthritis.  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  She is losing weight.  She is lost about 10 pounds since we last saw her.  She has had no other neurological issues.  There has been no problems with nausea or vomiting.  She has had no problems with bleeding.  She is on Xarelto and baby aspirin.  The cooler dryer weather seems to be helping her lungs more.  She has had some problems with constipation.  She apparently has scar tissue around her rectum or colon.  She says she is pregnant need surgery for this.  Currently, I would say her performance status is ECOG 1.  .   Medications:  Current Outpatient Medications:  .  acetaminophen (ACETAMINOPHEN 8 HOUR) 650 MG CR tablet, Take 650 mg by mouth every 8 (eight) hours as needed for pain., Disp: , Rfl:  .  albuterol (PROAIR HFA) 108 (90 BASE) MCG/ACT inhaler, Inhale 2 puffs into the lungs every 4 (four) hours as needed for wheezing or shortness of breath., Disp: 2 Inhaler, Rfl: prn .  albuterol (PROVENTIL) (2.5 MG/3ML) 0.083% nebulizer solution, Take 3 mLs (2.5 mg total) by nebulization every 6 (six) hours as needed for wheezing or shortness of breath., Disp: 1080 mL, Rfl: 3 .  amLODipine (NORVASC) 10 MG tablet, Take 10 mg by mouth daily. , Disp: , Rfl:  .  aspirin EC 81 MG tablet, Take 81 mg by mouth daily. Swallow whole., Disp: , Rfl:  .  budesonide (PULMICORT) 0.25 MG/2ML nebulizer solution, Take 2 mLs (0.25 mg total) by nebulization 2 (two) times  daily. Dx: J45.20, Disp: 120 mL, Rfl: 5 .  buPROPion (WELLBUTRIN XL) 150 MG 24 hr tablet, Take 150 mg by mouth daily., Disp: , Rfl:  .  co-enzyme Q-10 30 MG capsule, Take 30 mg by mouth 3 (three) times daily. Gummies, Disp: , Rfl:  .  donepezil (ARICEPT) 5 MG tablet, Take 5 mg by mouth at bedtime., Disp: , Rfl:  .  famotidine (PEPCID) 20 MG tablet, TAKE ONE TABLET BY MOUTH AT BEDTIME, Disp: 30 tablet, Rfl: 1 .  ferrous sulfate 324 (65 Fe) MG TBEC, Take 1 tablet (325 mg total) by mouth daily., Disp: 90 tablet, Rfl: 3 .  fluticasone (FLONASE) 50 MCG/ACT nasal spray, Place 1 spray into both nostrils daily., Disp: , Rfl:  .  Fluticasone-Salmeterol (ADVAIR DISKUS) 250-50 MCG/DOSE AEPB, Inhale 3 puffs into the lungs daily., Disp: , Rfl:  .  latanoprost (XALATAN) 0.005 % ophthalmic solution, Place 1 drop into both eyes at bedtime. , Disp: , Rfl: 2 .  levothyroxine (SYNTHROID, LEVOTHROID) 50 MCG tablet, Take 50 mcg by mouth daily before breakfast. , Disp: , Rfl:  .  montelukast (SINGULAIR) 10 MG tablet, Take 10 mg by mouth at bedtime., Disp: , Rfl:  .  pantoprazole (PROTONIX) 40 MG tablet, Take 40 mg by mouth daily. , Disp: , Rfl: 0 .  pregabalin (LYRICA) 75 MG capsule, TAKE ONE CAPSULE BY MOUTH TWICE DAILY,  Disp: 180 capsule, Rfl: 1 .  rOPINIRole (REQUIP) 2 MG tablet, Take 2 mg by mouth at bedtime. , Disp: , Rfl: 1 .  senna (SENOKOT) 8.6 MG TABS tablet, Take 8.6 mg by mouth at bedtime. , Disp: , Rfl:  .  venlafaxine XR (EFFEXOR-XR) 75 MG 24 hr capsule, Take 150 mg by mouth daily with breakfast. , Disp: , Rfl:  .  Vitamin D, Ergocalciferol, (DRISDOL) 50000 units CAPS capsule, Take 50,000 Units by mouth once a week., Disp: , Rfl:  .  XARELTO 10 MG TABS tablet, Take 1 tablet (10 mg total) by mouth daily with supper. (Patient taking differently: Take 10 mg by mouth daily. ), Disp: 30 tablet, Rfl: 12  Allergies:  Allergies  Allergen Reactions  . Naproxen Shortness Of Breath, Itching and Swelling  .  Tizanidine Shortness Of Breath    dizziness    Past Medical History, Surgical history, Social history, and Family History were reviewed and updated.  Review of Systems: Review of Systems  Constitutional: Negative.   HENT:  Negative.   Eyes: Negative.   Respiratory: Negative.   Cardiovascular: Negative.   Gastrointestinal: Negative.   Endocrine: Negative.   Genitourinary: Negative.    Musculoskeletal: Negative.   Skin: Negative.   Neurological: Negative.   Hematological: Negative.   Psychiatric/Behavioral: Negative.     Physical Exam:  vitals were not taken for this visit.   Wt Readings from Last 3 Encounters:  07/27/20 174 lb (78.9 kg)  07/22/20 176 lb (79.8 kg)  07/21/20 176 lb 6.4 oz (80 kg)    Physical Exam Vitals reviewed.  HENT:     Head: Normocephalic and atraumatic.  Eyes:     Pupils: Pupils are equal, round, and reactive to light.  Cardiovascular:     Rate and Rhythm: Normal rate and regular rhythm.     Heart sounds: Normal heart sounds.  Pulmonary:     Effort: Pulmonary effort is normal.     Breath sounds: Normal breath sounds.  Abdominal:     General: Bowel sounds are normal.     Palpations: Abdomen is soft.  Musculoskeletal:        General: No tenderness or deformity. Normal range of motion.     Cervical back: Normal range of motion.  Lymphadenopathy:     Cervical: No cervical adenopathy.  Skin:    General: Skin is warm and dry.     Findings: No erythema or rash.  Neurological:     Mental Status: She is alert and oriented to person, place, and time.  Psychiatric:        Behavior: Behavior normal.        Thought Content: Thought content normal.        Judgment: Judgment normal.     Lab Results  Component Value Date   WBC 11.7 (H) 09/18/2020   HGB 12.6 09/18/2020   HCT 40.2 09/18/2020   MCV 84.1 09/18/2020   PLT 362 09/18/2020     Chemistry      Component Value Date/Time   NA 140 07/27/2020 0812   NA 141 11/03/2015 1117   K 5.1  07/27/2020 0812   CL 104 07/27/2020 0812   CO2 29 07/27/2020 0812   BUN 9 07/27/2020 0812   BUN 22 11/03/2015 1117   CREATININE 1.09 (H) 07/27/2020 0812      Component Value Date/Time   CALCIUM 10.4 (H) 07/27/2020 0812   ALKPHOS 110 07/27/2020 0812   AST 23 07/27/2020 3785  ALT 11 07/27/2020 0812   BILITOT 0.5 07/27/2020 7185      Impression and Plan: [name] is a 74 year old white female with a pulmonary emboli and lower extremity thromboembolic disease.  She has a transiently  positive lupus anticoagulant.   I am glad that her quality of life is doing better now that her right knee has been operated on.  With the cooler weather, her breathing issues should also improve.  I think we can now get her through the holiday season.  We get her back next year.  I do not see that we had to make any changes with her on anticoagulation program.         Volanda Napoleon, MD 10/1/20218:10 AM

## 2020-09-19 LAB — CARDIOLIPIN ANTIBODIES, IGG, IGM, IGA
Anticardiolipin IgA: <9 APL U/mL (ref 0–11)
Anticardiolipin IgG: <9 GPL U/mL (ref 0–14)
Anticardiolipin IgM: 16 MPL U/mL — ABNORMAL HIGH (ref 0–12)

## 2020-09-19 LAB — LUPUS ANTICOAGULANT PANEL
DRVVT: 38.8 s (ref 0.0–47.0)
PTT Lupus Anticoagulant: 26.3 s (ref 0.0–51.9)

## 2020-09-21 ENCOUNTER — Other Ambulatory Visit: Payer: Self-pay

## 2020-09-21 ENCOUNTER — Ambulatory Visit (INDEPENDENT_AMBULATORY_CARE_PROVIDER_SITE_OTHER): Payer: Medicare Other

## 2020-09-21 DIAGNOSIS — D509 Iron deficiency anemia, unspecified: Secondary | ICD-10-CM | POA: Diagnosis not present

## 2020-09-22 LAB — IRON,TIBC AND FERRITIN PANEL
%SAT: 17 % (calc) (ref 16–45)
Ferritin: 32 ng/mL (ref 16–288)
Iron: 58 ug/dL (ref 45–160)
TIBC: 347 mcg/dL (calc) (ref 250–450)

## 2020-09-28 ENCOUNTER — Other Ambulatory Visit: Payer: Self-pay | Admitting: Hematology & Oncology

## 2020-09-28 ENCOUNTER — Ambulatory Visit (INDEPENDENT_AMBULATORY_CARE_PROVIDER_SITE_OTHER): Payer: Medicare Other

## 2020-09-28 ENCOUNTER — Other Ambulatory Visit: Payer: Self-pay

## 2020-09-28 DIAGNOSIS — Z23 Encounter for immunization: Secondary | ICD-10-CM

## 2020-10-01 ENCOUNTER — Other Ambulatory Visit: Payer: Self-pay | Admitting: Physician Assistant

## 2020-10-06 ENCOUNTER — Telehealth: Payer: Self-pay | Admitting: Physician Assistant

## 2020-10-06 NOTE — Telephone Encounter (Signed)
LVM for patient to r/s her AWV visit  Left message for patient to schedule Annual Wellness Visit.  Please schedule with Nurse Health Advisor Caroleen Hamman, RN at [facility]

## 2020-10-26 ENCOUNTER — Other Ambulatory Visit: Payer: Self-pay | Admitting: Physician Assistant

## 2020-12-02 ENCOUNTER — Ambulatory Visit: Payer: Medicare Other | Admitting: Internal Medicine

## 2020-12-08 ENCOUNTER — Encounter (HOSPITAL_COMMUNITY): Payer: Self-pay | Admitting: Emergency Medicine

## 2020-12-08 ENCOUNTER — Emergency Department (HOSPITAL_COMMUNITY): Payer: Medicare Other

## 2020-12-08 ENCOUNTER — Emergency Department (HOSPITAL_COMMUNITY)
Admission: EM | Admit: 2020-12-08 | Discharge: 2020-12-08 | Disposition: A | Discharge status: Home / Self Care | Payer: Medicare Other | Attending: Emergency Medicine | Admitting: Emergency Medicine

## 2020-12-08 DIAGNOSIS — S0083XA Contusion of other part of head, initial encounter: Secondary | ICD-10-CM | POA: Diagnosis not present

## 2020-12-08 DIAGNOSIS — S01112A Laceration without foreign body of left eyelid and periocular area, initial encounter: Secondary | ICD-10-CM

## 2020-12-08 DIAGNOSIS — E1122 Type 2 diabetes mellitus with diabetic chronic kidney disease: Secondary | ICD-10-CM | POA: Diagnosis not present

## 2020-12-08 DIAGNOSIS — S8002XA Contusion of left knee, initial encounter: Secondary | ICD-10-CM | POA: Diagnosis not present

## 2020-12-08 DIAGNOSIS — Z7901 Long term (current) use of anticoagulants: Secondary | ICD-10-CM | POA: Insufficient documentation

## 2020-12-08 DIAGNOSIS — Z79899 Other long term (current) drug therapy: Secondary | ICD-10-CM | POA: Insufficient documentation

## 2020-12-08 DIAGNOSIS — Z7982 Long term (current) use of aspirin: Secondary | ICD-10-CM | POA: Insufficient documentation

## 2020-12-08 DIAGNOSIS — Y9301 Activity, walking, marching and hiking: Secondary | ICD-10-CM | POA: Insufficient documentation

## 2020-12-08 DIAGNOSIS — E039 Hypothyroidism, unspecified: Secondary | ICD-10-CM | POA: Diagnosis not present

## 2020-12-08 DIAGNOSIS — I129 Hypertensive chronic kidney disease with stage 1 through stage 4 chronic kidney disease, or unspecified chronic kidney disease: Secondary | ICD-10-CM | POA: Insufficient documentation

## 2020-12-08 DIAGNOSIS — W010XXA Fall on same level from slipping, tripping and stumbling without subsequent striking against object, initial encounter: Secondary | ICD-10-CM | POA: Diagnosis not present

## 2020-12-08 DIAGNOSIS — N183 Chronic kidney disease, stage 3 unspecified: Secondary | ICD-10-CM | POA: Insufficient documentation

## 2020-12-08 DIAGNOSIS — W19XXXA Unspecified fall, initial encounter: Secondary | ICD-10-CM

## 2020-12-08 DIAGNOSIS — Z96651 Presence of right artificial knee joint: Secondary | ICD-10-CM | POA: Insufficient documentation

## 2020-12-08 DIAGNOSIS — J4520 Mild intermittent asthma, uncomplicated: Secondary | ICD-10-CM | POA: Insufficient documentation

## 2020-12-08 DIAGNOSIS — S51811A Laceration without foreign body of right forearm, initial encounter: Secondary | ICD-10-CM | POA: Insufficient documentation

## 2020-12-08 DIAGNOSIS — S0990XA Unspecified injury of head, initial encounter: Secondary | ICD-10-CM | POA: Diagnosis not present

## 2020-12-08 DIAGNOSIS — S51819A Laceration without foreign body of unspecified forearm, initial encounter: Secondary | ICD-10-CM

## 2020-12-08 LAB — CBC WITH DIFFERENTIAL/PLATELET
Abs Immature Granulocytes: 0.02 10*3/uL (ref 0.00–0.07)
Basophils Absolute: 0.1 10*3/uL (ref 0.0–0.1)
Basophils Relative: 1 %
Eosinophils Absolute: 0.1 10*3/uL (ref 0.0–0.5)
Eosinophils Relative: 1 %
HCT: 40.8 % (ref 36.0–46.0)
Hemoglobin: 13 g/dL (ref 12.0–15.0)
Immature Granulocytes: 0 %
Lymphocytes Relative: 49 %
Lymphs Abs: 3.8 10*3/uL (ref 0.7–4.0)
MCH: 27.7 pg (ref 26.0–34.0)
MCHC: 31.9 g/dL (ref 30.0–36.0)
MCV: 86.8 fL (ref 80.0–100.0)
Monocytes Absolute: 0.8 10*3/uL (ref 0.1–1.0)
Monocytes Relative: 11 %
Neutro Abs: 3 10*3/uL (ref 1.7–7.7)
Neutrophils Relative %: 38 %
Platelets: 323 10*3/uL (ref 150–400)
RBC: 4.7 MIL/uL (ref 3.87–5.11)
RDW: 16.4 % — ABNORMAL HIGH (ref 11.5–15.5)
WBC: 7.8 10*3/uL (ref 4.0–10.5)
nRBC: 0 % (ref 0.0–0.2)

## 2020-12-08 LAB — BASIC METABOLIC PANEL
Anion gap: 11 (ref 5–15)
BUN: 10 mg/dL (ref 8–23)
CO2: 21 mmol/L — ABNORMAL LOW (ref 22–32)
Calcium: 9.2 mg/dL (ref 8.9–10.3)
Chloride: 104 mmol/L (ref 98–111)
Creatinine, Ser: 0.97 mg/dL (ref 0.44–1.00)
GFR, Estimated: >60 mL/min (ref >60)
Glucose, Bld: 122 mg/dL — ABNORMAL HIGH (ref 70–99)
Potassium: 4.1 mmol/L (ref 3.5–5.1)
Sodium: 136 mmol/L (ref 135–145)

## 2020-12-08 NOTE — ED Notes (Signed)
Pt discharge instructions and follow-up care reviewed with the patient. The patient verbalized understanding of instructions. Pt discharged. 

## 2020-12-08 NOTE — ED Provider Notes (Signed)
[facility]ERGENCY DEPARTMENT Provider Note   CSN: [id number] Arrival date & time: 12/08/20  1355     History No chief complaint on file.   [name] is a 74 y.o. female.  She is here as a level 2 trauma for evaluation of injuries from a fall.  She was walking outside when she tripped and fell to the ground striking her face and left arm and hand.  No loss of consciousness.  She is on blood thinners for prior PE.  Has a little bit of neck pain.  No chest pain or back pain no abdominal pain.  No lower extremity pain.  She has some left arm and hand pain.  The skin tears.  Laceration above left eye.  Few abrasions to face.  Was wearing glasses at the time.  No eye pain  The history is provided by the patient and the EMS personnel.  Fall This is a new problem. The current episode started 1 to 2 hours ago. The problem occurs constantly. The problem has not changed since onset.Pertinent negatives include no chest pain, no abdominal pain, no headaches and no shortness of breath. The symptoms are aggravated by bending and twisting. Nothing relieves the symptoms. She has tried nothing for the symptoms. The treatment provided no relief.       Past Medical History:  Diagnosis Date  . Allergic rhinitis   . Allergic rhinitis   . Anxiety   . Arthritis    "hands, feet, shoulders, arms" (02/22/2018)  . 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 Chronic cough   . Depression   . Dupuytren contracture    "both feet, both hands" (02/22/2018)  . DVT (deep venous thrombosis) ([facility])    bil le  . GERD (gastroesophageal reflux disease)   . Glaucoma   . Hypertension   . Hypothyroidism   . Mild persistent asthma    cougher not a wheezr gets chokes easily  . Pneumonia   . PONV (postoperative nausea and vomiting)     Patient Active Problem List   Diagnosis Date Noted  . TIA (transient ischemic attack) 07/11/2020  . Dizziness 07/11/2020  . RLS (restless legs  syndrome) 07/11/2020  . 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 DOE (dyspnea on exertion) 12/03/2019  . CKD (chronic kidney disease) stage 3, GFR 30-59 ml/min 02/24/2018  . Acute pulmonary embolism ([place]) 02/22/2018  . Type 2 diabetes mellitus with stage 3 chronic kidney disease ([place]) 02/22/2018  . Elevated troponin 02/22/2018  . Cyst of right orbit 01/01/2016  . Left facial pain 11/03/2015  . Acute maxillary sinusitis 03/08/2015  . Hypothyroidism 01/23/2008  . Seasonal and perennial allergic rhinitis 01/23/2008  . Asthma, mild intermittent 01/23/2008  . Esophageal reflux 01/23/2008  . Essential hypertension 01/23/2008    Past Surgical History:  Procedure Laterality Date  . BLADDER SUSPENSION  X 2  . BUNIONECTOMY     RIGHT FOOT ARCH REPAIRED AS WELL  . CATARACT EXTRACTION W/ INTRAOCULAR LENS  IMPLANT, BILATERAL Bilateral   . DILATION AND CURETTAGE OF UTERUS  X 3  . DUPUYTREN CONTRACTURE RELEASE Bilateral    "feet"  . FASCIECTOMY Right 01/23/2018   Procedure: SEGMENTAL FASCIECTOMY RIGHT MIDDLE FINGER;  Surgeon: Daryll Brod, MD;  Location: [facility];  Service: Orthopedics;  Laterality: Right;  AXILLARY BLOCK  . KNEE ARTHROSCOPY Right   . REDUCTION MAMMAPLASTY Bilateral 2018  . TOTAL ABDOMINAL HYSTERECTOMY  1975   "got pregnant w/IUD in then lost  baby"  . TOTAL KNEE ARTHROPLASTY Right 06/08/2020   Procedure: TOTAL KNEE ARTHROPLASTY;  Surgeon: Vickey Huger, MD;  Location: [facility];  Service: Orthopedics;  Laterality: Right;     OB History   No obstetric history on file.     Family History  Problem Relation Age of Onset  . Pneumonia Father   . Arthritis Mother   . Breast cancer Mother   . Diabetes Brother   . Breast cancer Maternal Aunt        Home Medications Prior to Admission medications   Medication Sig Start Date End Date Taking? Authorizing Provider  ADVAIR DISKUS 250-50 MCG/DOSE AEPB Inhale 1 puff into the lungs 2 (two) times daily. 12/03/20  Yes  [provider]  albuterol (PROVENTIL) (2.5 MG/3ML) 0.083% nebulizer solution Take 2.5 mg by nebulization every 4 (four) hours as needed for wheezing or shortness of breath. 06/17/20  Yes [provider]  amLODipine (NORVASC) 10 MG tablet Take 10 mg by mouth daily. 11/24/20  Yes [provider]  buPROPion (WELLBUTRIN XL) 300 MG 24 hr tablet Take 300 mg by mouth every morning. 11/24/20  Yes [provider]  donepezil (ARICEPT) 10 MG tablet Take 10 mg by mouth at bedtime. 10/26/20  Yes [provider]  famotidine (PEPCID) 20 MG tablet Take 20 mg by mouth at bedtime. 11/24/20  Yes [provider]  ferrous sulfate 324 (65 Fe) MG TBEC Take 1 tablet by mouth daily. 11/14/20  Yes [provider]  fluticasone (FLONASE) 50 MCG/ACT nasal spray Place 1 spray into both nostrils daily. 10/27/20  Yes [provider]  levothyroxine (SYNTHROID) 50 MCG tablet Take 50 mcg by mouth daily. 11/02/20  Yes [provider]  memantine (NAMENDA) 5 MG tablet Take 5 mg by mouth 2 (two) times daily. 12/03/20  Yes [provider]  montelukast (SINGULAIR) 10 MG tablet Take 10 mg by mouth at bedtime. 11/24/20  Yes [provider]  pantoprazole (PROTONIX) 40 MG tablet Take 40 mg by mouth daily. 09/30/20  Yes [provider]  pregabalin (LYRICA) 100 MG capsule Take 100 mg by mouth 2 (two) times daily. 12/03/20  Yes [provider]  rOPINIRole (REQUIP) 2 MG tablet Take 3 mg by mouth at bedtime. 11/02/20  Yes [provider]  venlafaxine XR (EFFEXOR-XR) 150 MG 24 hr capsule Take 150 mg by mouth every morning. 09/30/20  Yes [provider]  Vitamin D, Ergocalciferol, (DRISDOL) 1.25 MG (50000 UNIT) CAPS capsule Take 50,000 Units by mouth every Sunday. 09/21/20  Yes [provider]  XARELTO 10 MG TABS tablet Take 10 mg by mouth at bedtime. 11/24/20  Yes [provider]  acetaminophen  (ACETAMINOPHEN 8 HOUR) 650 MG CR tablet Take 650 mg by mouth every 8 (eight) hours as needed for pain.    [provider]  ADVAIR DISKUS 250-50 MCG/DOSE AEPB Inhale one puff into the lungs 2 (two) times daily. 10/01/20   Brunetta Jeans, PA-C  albuterol (PROAIR HFA) 108 (90 BASE) MCG/ACT inhaler Inhale 2 puffs into the lungs every 4 (four) hours as needed for wheezing or shortness of breath. 11/20/14   Baird Lyons D, MD  albuterol (PROVENTIL) (2.5 MG/3ML) 0.083% nebulizer solution Take 3 mLs (2.5 mg total) by nebulization every 6 (six) hours as needed for wheezing or shortness of breath. 11/26/14   Baird Lyons D, MD  amLODipine (NORVASC) 10 MG tablet Take 10 mg by mouth daily.     [provider]  aspirin  EC 81 MG tablet Take 81 mg by mouth daily. Swallow whole.    [provider]  budesonide (PULMICORT) 0.25 MG/2ML nebulizer solution Take 2 mLs (0.25 mg total) by nebulization 2 (two) times daily. Dx: J45.20 03/01/18   Parrett, Fonnie Mu, NP  buPROPion (WELLBUTRIN XL) 300 MG 24 hr tablet Take 300 mg by mouth every morning. 08/06/20   [provider]  co-enzyme Q-10 30 MG capsule Take 30 mg by mouth 3 (three) times daily. Gummies    [provider]  Docusate Sodium (DSS) 250 MG CAPS Take 250 mg by mouth daily.    [provider]  donepezil (ARICEPT) 10 MG tablet Take 10 mg by mouth at bedtime. 07/30/20   [provider]  famotidine (PEPCID) 20 MG tablet TAKE ONE TABLET BY MOUTH AT BEDTIME 10/27/20   Brunetta Jeans, PA-C  ferrous sulfate 324 (65 Fe) MG TBEC Take 1 tablet (325 mg total) by mouth daily. 08/10/20   Brunetta Jeans, PA-C  fluticasone [facility]) 50 MCG/ACT nasal spray INSTILL ONE SPRAY NASALLY DAILY 10/27/20   Brunetta Jeans, PA-C  latanoprost (XALATAN) 0.005 % ophthalmic solution Place 1 drop into both eyes at bedtime.  08/18/15   [provider]  levothyroxine (SYNTHROID, LEVOTHROID) 50 MCG tablet Take 50 mcg by  mouth daily before breakfast.  09/02/13   [provider]  meclizine (ANTIVERT) 25 MG tablet Take 25-50 mg by mouth 3 (three) times daily. 09/14/20   [provider]  montelukast (SINGULAIR) 10 MG tablet Take 10 mg by mouth at bedtime.    [provider]  pantoprazole (PROTONIX) 40 MG tablet Take 40 mg by mouth daily.  08/29/15   [provider]  predniSONE (DELTASONE) 10 MG tablet Take 10 mg by mouth daily. 09/14/20   [provider]  pregabalin (LYRICA) 75 MG capsule TAKE ONE CAPSULE BY MOUTH TWICE DAILY 09/15/20   Brunetta Jeans, PA-C  rOPINIRole (REQUIP) 2 MG tablet Take 2 mg by mouth at bedtime.  11/10/14   [provider]  senna (SENOKOT) 8.6 MG TABS tablet Take 8.6 mg by mouth at bedtime.  10/30/18   [provider]  venlafaxine XR (EFFEXOR-XR) 75 MG 24 hr capsule Take 150 mg by mouth daily with breakfast.     [provider]  Vitamin D, Ergocalciferol, (DRISDOL) 50000 units CAPS capsule Take 50,000 Units by mouth once a week. 12/05/16   [provider]  XARELTO 10 MG TABS tablet Take 1 tablet (10 mg total) by mouth daily with supper. 09/28/20   Volanda Napoleon, MD    Allergies    Naproxen, Tizanidine, and Naprosyn [naproxen]  Review of Systems   Review of Systems  Constitutional: Negative for fever.  HENT: Positive for facial swelling. Negative for sore throat.   Eyes: Negative for pain.  Respiratory: Negative for shortness of breath.   Cardiovascular: Negative for chest pain.  Gastrointestinal: Negative for abdominal pain.  Genitourinary: Negative for dysuria.  Musculoskeletal: Positive for neck pain. Negative for back pain.  Skin: Positive for wound. Negative for rash.  Neurological: Negative for headaches.    Physical Exam Updated Vital Signs BP (!) 169/76 (BP Location: Right Arm)   Pulse 98   Temp 98 F (36.7 C) (Oral)   Resp 18   Ht 5\' 2"  (1.575 m)   Wt 74.4 kg   SpO2 99%   BMI 30.00  kg/m   Physical Exam Vitals and nursing note reviewed.  Constitutional:  General: She is not in acute distress.    Appearance: She is well-developed and well-nourished.  HENT:     Head: Normocephalic.     Comments: Has a small laceration on her left superior orbital ridge.  Hematoma there.  Few abrasions to her temporal area. Eyes:     Conjunctiva/sclera: Conjunctivae normal.  Neck:     Comments: She has some vague cervical tenderness not necessarily midline. Cardiovascular:     Rate and Rhythm: Normal rate and regular rhythm.     Heart sounds: No murmur heard.   Pulmonary:     Effort: Pulmonary effort is normal. No respiratory distress.     Breath sounds: Normal breath sounds.  Abdominal:     Palpations: Abdomen is soft.     Tenderness: There is no abdominal tenderness.  Musculoskeletal:        General: Tenderness and signs of injury present. No deformity or edema. Normal range of motion.     Cervical back: Neck supple. Tenderness present.     Comments: She has 2 skin tears on her forearm and some on her knuckles of her left hand.  Tenderness no gross deformity.  Other extremities full range of motion without any pain or limitations.  Skin:    General: Skin is warm and dry.  Neurological:     General: No focal deficit present.     Mental Status: She is alert and oriented to person, place, and time.     Sensory: No sensory deficit.     Motor: No weakness.  Psychiatric:        Mood and Affect: Mood and affect normal.     ED Results / Procedures / Treatments   Labs (all labs ordered are listed, but only abnormal results are displayed) Labs Reviewed  BASIC METABOLIC PANEL - Abnormal; Notable for the following components:      Result Value   CO2 21 (*)    Glucose, Bld 122 (*)    All other components within normal limits  CBC WITH DIFFERENTIAL/PLATELET - Abnormal; Notable for the following components:   RDW 16.4 (*)    All other components within normal limits     EKG None  Radiology DG Forearm Left  Result Date: 12/08/2020 CLINICAL DATA:  Fall. EXAM: LEFT FOREARM - 2 VIEW COMPARISON:  No prior. FINDINGS: No acute bony or joint abnormality. No evidence of fracture or dislocation. IMPRESSION: No acute abnormality. Electronically Signed   By: Maisie Fushomas  Register   On: 12/08/2020 14:42   CT Head Wo Contrast  Result Date: 12/08/2020 CLINICAL DATA:  Fall with trauma to the head, neck and face. EXAM: CT HEAD WITHOUT CONTRAST CT MAXILLOFACIAL WITHOUT CONTRAST CT CERVICAL SPINE WITHOUT CONTRAST TECHNIQUE: Multidetector CT imaging of the head, cervical spine, and maxillofacial structures were performed using the standard protocol without intravenous contrast. Multiplanar CT image reconstructions of the cervical spine and maxillofacial structures were also generated. COMPARISON:  07/12/2020 FINDINGS: CT HEAD FINDINGS Brain: Mild age related volume loss. No evidence of acute infarction, mass lesion, hemorrhage, hydrocephalus or extra-axial collection. Vascular: No abnormal vascular finding. Skull: No skull fracture. Other: None significant. CT MAXILLOFACIAL FINDINGS No evidence of facial fracture. No fluid in the sinuses. No sinus inflammatory changes other than chronic opacification of a single posterior ethmoid air cell on the left. No evidence of globe or orbital injury. 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 CT CERVICAL SPINE FINDINGS Alignment: No traumatic malalignment. Straightening  and mild kyphotic curvature in the cervical region. Skull base and vertebrae: No fracture or focal bone lesion. Soft tissues and spinal canal: No soft tissue injury. No mass or lymphadenopathy. Disc levels: Facet osteoarthritis on the right most pronounced at C4-5 and present to a lesser degree at C7-T1 and C2-3. Facet osteoarthritis on the left of a mild degree at C3-4 and C7-T1. Small vertebral endplate and uncovertebral osteophytes  at C3-4, C4-5 and C5-6 but without apparent significant canal or foraminal narrowing. Upper chest: Normal Other: None IMPRESSION: HEAD CT: No acute or traumatic finding. Mild age related volume loss. MAXILLOFACIAL CT: No acute or traumatic finding. CERVICAL SPINE CT: No acute or traumatic finding. Ordinary spondylosis and facet osteoarthritis. Electronically Signed   By: Nelson Chimes M.D.   On: 12/08/2020 14:58   CT Cervical Spine Wo Contrast  Result Date: 12/08/2020 CLINICAL DATA:  Fall with trauma to the head, neck and face. EXAM: CT HEAD WITHOUT CONTRAST CT MAXILLOFACIAL WITHOUT CONTRAST CT CERVICAL SPINE WITHOUT CONTRAST TECHNIQUE: Multidetector CT imaging of the head, cervical spine, and maxillofacial structures were performed using the standard protocol without intravenous contrast. Multiplanar CT image reconstructions of the cervical spine and maxillofacial structures were also generated. COMPARISON:  07/12/2020 FINDINGS: CT HEAD FINDINGS Brain: Mild age related volume loss. No evidence of acute infarction, mass lesion, hemorrhage, hydrocephalus or extra-axial collection. Vascular: No abnormal vascular finding. Skull: No skull fracture. Other: None significant. CT MAXILLOFACIAL FINDINGS No evidence of facial fracture. No fluid in the sinuses. No sinus inflammatory changes other than chronic opacification of a single posterior ethmoid air cell on the left. No evidence of globe or orbital injury. 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 CT CERVICAL SPINE FINDINGS Alignment: No traumatic malalignment. Straightening and mild kyphotic curvature in the cervical region. Skull base and vertebrae: No fracture or focal bone lesion. Soft tissues and spinal canal: No soft tissue injury. No mass or lymphadenopathy. Disc levels: Facet osteoarthritis on the right most pronounced at C4-5 and present to a lesser degree at C7-T1 and C2-3. Facet osteoarthritis on the left  of a mild degree at C3-4 and C7-T1. Small vertebral endplate and uncovertebral osteophytes at C3-4, C4-5 and C5-6 but without apparent significant canal or foraminal narrowing. Upper chest: Normal Other: None IMPRESSION: HEAD CT: No acute or traumatic finding. Mild age related volume loss. MAXILLOFACIAL CT: No acute or traumatic finding. CERVICAL SPINE CT: No acute or traumatic finding. Ordinary spondylosis and facet osteoarthritis. Electronically Signed   By: Nelson Chimes M.D.   On: 12/08/2020 14:58   DG Shoulder Left Portable  Result Date: 12/08/2020 CLINICAL DATA:  Left shoulder pain after a fall.  Initial encounter. EXAM: LEFT SHOULDER COMPARISON:  None. FINDINGS: There is no evidence of fracture or dislocation. There is no evidence of arthropathy or other focal bone abnormality. The humeral head is high-riding. Soft tissues are unremarkable. IMPRESSION: No acute abnormality. High-riding humeral head consistent with chronic rotator cuff tears. Electronically Signed   By: Inge Rise M.D.   On: 12/08/2020 14:41   DG Knee Complete 4 Views Left  Result Date: 12/08/2020 CLINICAL DATA:  Left knee pain after fall EXAM: LEFT KNEE - COMPLETE 4+ VIEW COMPARISON:  None. FINDINGS: Frontal, bilateral oblique, and cross-table lateral views of the left knee are obtained. There is severe 3 compartmental osteoarthritis greatest in the medial compartment. Slight valgus angulation of the left knee due to the osteoarthritis. No fracture. No dislocation. Small  joint effusion is likely reactive. IMPRESSION: 1. Severe 3 compartmental osteoarthritis greatest medially. 2. Small joint effusion likely reactive. 3. No acute displaced fracture. Electronically Signed   By: Randa Ngo M.D.   On: 12/08/2020 16:27   DG Hand Complete Left  Result Date: 12/08/2020 CLINICAL DATA:  Fall. EXAM: LEFT HAND - COMPLETE 3+ VIEW COMPARISON:  None. FINDINGS: No acute fracture or dislocation. Severe first CMC joint osteoarthritis  with subluxation. Diffuse IP joint osteoarthritis, worse at the fourth and fifth PIP joints, with central erosions of the fourth PIP joint. Osteopenia. Soft tissues are unremarkable. No chondrocalcinosis. IMPRESSION: 1. No acute osseous abnormality. 2. 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 Electronically Signed   By: Titus Dubin M.D.   On: 12/08/2020 14:47   CT Maxillofacial WO CM  Result Date: 12/08/2020 CLINICAL DATA:  Fall with trauma to the head, neck and face. EXAM: CT HEAD WITHOUT CONTRAST CT MAXILLOFACIAL WITHOUT CONTRAST CT CERVICAL SPINE WITHOUT CONTRAST TECHNIQUE: Multidetector CT imaging of the head, cervical spine, and maxillofacial structures were performed using the standard protocol without intravenous contrast. Multiplanar CT image reconstructions of the cervical spine and maxillofacial structures were also generated. COMPARISON:  07/12/2020 FINDINGS: CT HEAD FINDINGS Brain: Mild age related volume loss. No evidence of acute infarction, mass lesion, hemorrhage, hydrocephalus or extra-axial collection. Vascular: No abnormal vascular finding. Skull: No skull fracture. Other: None significant. CT MAXILLOFACIAL FINDINGS No evidence of facial fracture. No fluid in the sinuses. No sinus inflammatory changes other than chronic opacification of a single posterior ethmoid air cell on the left. No evidence of globe or orbital injury. 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 CT CERVICAL SPINE FINDINGS Alignment: No traumatic malalignment. Straightening and mild kyphotic curvature in the cervical region. Skull base and vertebrae: No fracture or focal bone lesion. Soft tissues and spinal canal: No soft tissue injury. No mass or lymphadenopathy. Disc levels: Facet osteoarthritis on the right most pronounced at C4-5 and present to a lesser degree at C7-T1 and C2-3. Facet osteoarthritis on the left of a mild  degree at C3-4 and C7-T1. Small vertebral endplate and uncovertebral osteophytes at C3-4, C4-5 and C5-6 but without apparent significant canal or foraminal narrowing. Upper chest: Normal Other: None IMPRESSION: HEAD CT: No acute or traumatic finding. Mild age related volume loss. MAXILLOFACIAL CT: No acute or traumatic finding. CERVICAL SPINE CT: No acute or traumatic finding. Ordinary spondylosis and facet osteoarthritis. Electronically Signed   By: Nelson Chimes M.D.   On: 12/08/2020 14:58    Procedures .Marland KitchenLaceration Repair  Date/Time: 12/08/2020 2:52 PM Performed by: Hayden Rasmussen, MD Authorized by: Hayden Rasmussen, MD   Consent:    Consent obtained:  Verbal   Consent given by:  Patient   Risks discussed:  Infection, pain, poor cosmetic result, poor wound healing and retained foreign body   Alternatives discussed:  No treatment and delayed treatment Anesthesia:    Anesthesia method:  None Laceration details:    Location:  Face   Face location:  L eyebrow   Length (cm):  2 Treatment:    Area cleansed with:  Saline Skin repair:    Repair method:  Tissue adhesive Approximation:    Approximation:  Close Repair type:    Repair type:  Simple Post-procedure details:    Dressing:  Open (no dressing)   Procedure completion:  Tolerated well, no immediate complications   (including critical care time)  Medications  Ordered in ED Medications - No data to display  ED Course  I have reviewed the triage vital signs and the nursing notes.  Pertinent labs & imaging results that were available during my care of the patient were reviewed by me and considered in my medical decision making (see chart for details).  Clinical Course as of 12/09/20 0941  Tue Dec 08, 2020  1443 X-rays of left shoulder left forearm left hand show degenerative changes but no acute fractures.  Interpreted by me.  Awaiting radiology reading.  Patient now complaining of left knee pain so also ordered x-rays of  that.  Getting CT head and cervical spine along with max face. [MB]  1501 CT imaging of head cervical spine and max face did not show any acute traumatic injuries. [MB]    Clinical Course User Index [MB] Hayden Rasmussen, MD   MDM Rules/Calculators/A&P                         This patient complains of fall; this involves an extensive number of treatment Options and is a complaint that carries with it a high risk of complications and Morbidity. The differential includes fracture, facial fractures, head bleed, cervical spine fracture, orthopedic injuries, lacerations  I ordered, reviewed and interpreted labs, which included CBC with normal white count normal hemoglobin, chemistries normal other than mildly low bicarb and elevated glucose  I ordered imaging studies which included CT head cervical spine and maxillofacial along with x-rays of left shoulder forearm hand and left knee and I independently    visualized and interpreted imaging which showed degenerative changes no acute fractures Additional history obtained from EMS Previous records obtained and reviewed in epic, no recent admissions  After the interventions stated above, I reevaluated the patient and found her symptoms to be improving.  Signed out to oncoming provider Dr. Stark Jock to follow-up on final readings on her imaging and ambulate.  If she can ambulate safely she likely can be discharged home.   Final Clinical Impression(s) / ED Diagnoses Final diagnoses:  Injury of head, initial encounter  Contusion of face, initial encounter  Eyebrow laceration, left, initial encounter  Skin tear of forearm without complication, initial encounter  Contusion of left knee, initial encounter  Fall, initial encounter    Rx / DC Orders ED Discharge Orders    None       Hayden Rasmussen, MD 12/09/20 [phone]

## 2020-12-08 NOTE — Discharge Instructions (Addendum)
Continue medications as previously prescribed.  Follow-up with your primary doctor if symptoms are not improving in the next week.

## 2020-12-08 NOTE — ED Triage Notes (Signed)
Pt here as a level 2 fall on blood thinners , pt tripped on a step walking outside , pt has small lac to the left eye and pain the left arm and shoulder , bleeding controlled

## 2020-12-08 NOTE — Progress Notes (Signed)
Orthopedic Tech Progress Note Patient Details:  [name] 30-Jan-1946 [id number] Level 2 trauma Patient ID: [name], female   DOB: 11/09/1946, 74 y.o.   MRN: [id number]   [name] 12/08/2020, 3:05 PM

## 2020-12-08 NOTE — ED Provider Notes (Signed)
Care assumed from Dr. Melina Copa at shift change.  Patient awaiting results of knee films.  Studies have returned showing mainly degenerative changes with a small effusion, the acuteness of which I am uncertain.  Patient was able to ambulate with difficulty and appears appropriate for discharge.  I have reviewed the remainder of her studies which are all essentially unremarkable.  Patient to follow-up with her primary doctor if not improving in the next week.   Veryl Speak, MD 12/08/20 1710

## 2020-12-09 ENCOUNTER — Encounter: Payer: Self-pay | Admitting: Hematology & Oncology

## 2020-12-09 ENCOUNTER — Encounter (HOSPITAL_COMMUNITY): Payer: Self-pay | Admitting: Emergency Medicine

## 2020-12-28 ENCOUNTER — Other Ambulatory Visit: Payer: Self-pay | Admitting: Physician Assistant

## 2021-01-19 ENCOUNTER — Inpatient Hospital Stay: Payer: Medicare Other

## 2021-01-19 ENCOUNTER — Inpatient Hospital Stay: Payer: Medicare Other | Attending: Hematology & Oncology | Admitting: Hematology & Oncology

## 2021-01-19 ENCOUNTER — Encounter: Payer: Self-pay | Admitting: Hematology & Oncology

## 2021-01-19 ENCOUNTER — Other Ambulatory Visit: Payer: Self-pay

## 2021-01-19 VITALS — BP 163/81 | HR 73 | Temp 97.7°F | Resp 18 | Wt 164.0 lb

## 2021-01-19 DIAGNOSIS — D6862 Lupus anticoagulant syndrome: Secondary | ICD-10-CM | POA: Insufficient documentation

## 2021-01-19 DIAGNOSIS — Z7901 Long term (current) use of anticoagulants: Secondary | ICD-10-CM | POA: Diagnosis not present

## 2021-01-19 DIAGNOSIS — Z886 Allergy status to analgesic agent status: Secondary | ICD-10-CM | POA: Diagnosis not present

## 2021-01-19 DIAGNOSIS — M329 Systemic lupus erythematosus, unspecified: Secondary | ICD-10-CM | POA: Insufficient documentation

## 2021-01-19 DIAGNOSIS — Z79899 Other long term (current) drug therapy: Secondary | ICD-10-CM | POA: Diagnosis not present

## 2021-01-19 DIAGNOSIS — I2699 Other pulmonary embolism without acute cor pulmonale: Secondary | ICD-10-CM | POA: Diagnosis present

## 2021-01-19 DIAGNOSIS — G459 Transient cerebral ischemic attack, unspecified: Secondary | ICD-10-CM

## 2021-01-19 DIAGNOSIS — R634 Abnormal weight loss: Secondary | ICD-10-CM | POA: Insufficient documentation

## 2021-01-19 DIAGNOSIS — R059 Cough, unspecified: Secondary | ICD-10-CM | POA: Diagnosis not present

## 2021-01-19 LAB — CMP (CANCER CENTER ONLY)
ALT: 11 U/L (ref 0–44)
AST: 21 U/L (ref 15–41)
Albumin: 4.2 g/dL (ref 3.5–5.0)
Alkaline Phosphatase: 105 U/L (ref 38–126)
Anion gap: 10 (ref 5–15)
BUN: 16 mg/dL (ref 8–23)
CO2: 28 mmol/L (ref 22–32)
Calcium: 10 mg/dL (ref 8.9–10.3)
Chloride: 105 mmol/L (ref 98–111)
Creatinine: 0.98 mg/dL (ref 0.44–1.00)
GFR, Estimated: >60 mL/min (ref >60)
Glucose, Bld: 95 mg/dL (ref 70–99)
Potassium: 3.9 mmol/L (ref 3.5–5.1)
Sodium: 143 mmol/L (ref 135–145)
Total Bilirubin: 0.4 mg/dL (ref 0.3–1.2)
Total Protein: 6.9 g/dL (ref 6.5–8.1)

## 2021-01-19 LAB — CBC WITH DIFFERENTIAL (CANCER CENTER ONLY)
Abs Immature Granulocytes: 0.02 10*3/uL (ref 0.00–0.07)
Basophils Absolute: 0.1 10*3/uL (ref 0.0–0.1)
Basophils Relative: 1 %
Eosinophils Absolute: 0.2 10*3/uL (ref 0.0–0.5)
Eosinophils Relative: 2 %
HCT: 42.3 % (ref 36.0–46.0)
Hemoglobin: 13.2 g/dL (ref 12.0–15.0)
Immature Granulocytes: 0 %
Lymphocytes Relative: 57 %
Lymphs Abs: 5.4 10*3/uL — ABNORMAL HIGH (ref 0.7–4.0)
MCH: 28.1 pg (ref 26.0–34.0)
MCHC: 31.2 g/dL (ref 30.0–36.0)
MCV: 90 fL (ref 80.0–100.0)
Monocytes Absolute: 1.2 10*3/uL — ABNORMAL HIGH (ref 0.1–1.0)
Monocytes Relative: 12 %
Neutro Abs: 2.6 10*3/uL (ref 1.7–7.7)
Neutrophils Relative %: 28 %
Platelet Count: 299 10*3/uL (ref 150–400)
RBC: 4.7 MIL/uL (ref 3.87–5.11)
RDW: 15.2 % (ref 11.5–15.5)
WBC Count: 9.5 10*3/uL (ref 4.0–10.5)
nRBC: 0 % (ref 0.0–0.2)

## 2021-01-19 LAB — LACTATE DEHYDROGENASE: LDH: 184 U/L (ref 98–192)

## 2021-01-19 NOTE — Progress Notes (Signed)
Hematology and Oncology Follow Up Visit  [name] [id number] 10-25-1946 75 y.o. 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 q day -- maintanence on 09/06/2018  EC ASA 81 mg po q day     Interim History:  [name] is back for follow-up.  We last saw her back in October.  She is doing pretty well.  Unfortunately, she is going to the surgery for the left knee.  She had surgery for the right knee last year.  She is lost weight.  She is doing well with the weight loss.  She has had no problems with bowels or bladder.  Her last mammogram was about 6 months ago.  She did enjoy the holiday season.  She was with her family.  There is been no flareups of her COPD.  She is doing well on the Xarelto.  She does have a little bit of a cough but this is nonproductive.  Her last lupus anticoagulant test in October was negative.  There is been no rashes.  She has had no leg swelling.  There is been no chest wall pain.  Overall, her performance status is ECOG 1.    Medications:  Current Outpatient Medications:  .  ADVAIR DISKUS 250-50 MCG/DOSE AEPB, Inhale one puff into the lungs 2 (two) times daily., Disp: 60 each, Rfl: 3 .  amLODipine (NORVASC) 10 MG tablet, Take 10 mg by mouth daily. , Disp: , Rfl:  .  aspirin EC 81 MG tablet, Take 81 mg by mouth daily. Swallow whole., Disp: , Rfl:  .  budesonide (PULMICORT) 0.25 MG/2ML nebulizer solution, Take 2 mLs (0.25 mg total) by nebulization 2 (two) times daily. Dx: J45.20, Disp: 120 mL, Rfl: 5 .  buPROPion (WELLBUTRIN XL) 300 MG 24 hr tablet, Take 300 mg by mouth every morning., Disp: , Rfl:  .  co-enzyme Q-10 30 MG capsule, Take 30 mg by mouth 3 (three) times daily. Gummies, Disp: , Rfl:  .  Docusate Sodium (DSS) 250 MG CAPS, Take 250 mg by mouth daily., Disp:  , Rfl:  .  donepezil (ARICEPT) 10 MG tablet, Take 10 mg by mouth at bedtime., Disp: , Rfl:  .  ferrous sulfate 324 (65 Fe) MG TBEC, Take 1 tablet (325 mg total) by mouth daily., Disp: 90 tablet, Rfl: 3 .  fluticasone (FLONASE) 50 MCG/ACT nasal spray, INSTILL ONE SPRAY NASALLY DAILY, Disp: 48 g, Rfl: 0 .  latanoprost (XALATAN) 0.005 % ophthalmic solution, Place 1 drop into both eyes at bedtime. , Disp: , Rfl: 2 .  levothyroxine (SYNTHROID) 50 MCG tablet, Take 50 mcg by mouth daily., Disp: , Rfl:  .  meclizine (ANTIVERT) 25 MG tablet, Take 25-50 mg by mouth 3 (three) times daily., Disp: , Rfl:  .  memantine (NAMENDA) 5 MG tablet, Take 5 mg by mouth 2 (two) times daily., Disp: , Rfl:  .  montelukast (SINGULAIR) 10 MG tablet, Take 10 mg by mouth at bedtime., Disp: , Rfl:  .  pantoprazole (PROTONIX) 40 MG tablet, Take 40 mg by mouth daily. , Disp: , Rfl: 0 .  pregabalin (LYRICA) 75 MG capsule, TAKE ONE CAPSULE BY MOUTH TWICE DAILY, Disp: 180 capsule, Rfl: 1 .  rOPINIRole (REQUIP) 2 MG tablet, Take 2 mg by mouth at bedtime. , Disp: , Rfl: 1 .  senna (SENOKOT) 8.6 MG TABS  tablet, Take 8.6 mg by mouth at bedtime. , Disp: , Rfl:  .  venlafaxine XR (EFFEXOR-XR) 150 MG 24 hr capsule, Take 150 mg by mouth every morning., Disp: , Rfl:  .  venlafaxine XR (EFFEXOR-XR) 75 MG 24 hr capsule, Take 150 mg by mouth daily with breakfast. , Disp: , Rfl:  .  Vitamin D, Ergocalciferol, (DRISDOL) 1.25 MG (50000 UNIT) CAPS capsule, Take 50,000 Units by mouth every Sunday., Disp: , Rfl:  .  XARELTO 10 MG TABS tablet, Take 1 tablet (10 mg total) by mouth daily with supper., Disp: 30 tablet, Rfl: 12 .  acetaminophen (TYLENOL) 650 MG CR tablet, Take 650 mg by mouth every 8 (eight) hours as needed for pain. (Patient not taking: Reported on 01/19/2021), Disp: , Rfl:  .  albuterol (PROAIR HFA) 108 (90 BASE) MCG/ACT inhaler, Inhale 2 puffs into the lungs every 4 (four) hours as needed for wheezing or shortness of breath., Disp: 2  Inhaler, Rfl: prn .  albuterol (PROVENTIL) (2.5 MG/3ML) 0.083% nebulizer solution, Take 3 mLs (2.5 mg total) by nebulization every 6 (six) hours as needed for wheezing or shortness of breath. (Patient not taking: Reported on 01/19/2021), Disp: 1080 mL, Rfl: 3 .  famotidine (PEPCID) 20 MG tablet, TAKE ONE TABLET BY MOUTH AT BEDTIME (Patient not taking: Reported on 01/19/2021), Disp: 30 tablet, Rfl: 1 .  predniSONE (DELTASONE) 10 MG tablet, Take 10 mg by mouth daily., Disp: , Rfl:  .  pregabalin (LYRICA) 100 MG capsule, Take 100 mg by mouth 2 (two) times daily., Disp: , Rfl:   Allergies:  Allergies  Allergen Reactions  . Naproxen Shortness Of Breath, Itching and Swelling  . Tizanidine Shortness Of Breath    dizziness  . Naprosyn [Naproxen]     Unknown reaction    Past Medical History, Surgical history, Social history, and Family History were reviewed and updated.  Review of Systems: Review of Systems  Constitutional: Negative.   HENT:  Negative.   Eyes: Negative.   Respiratory: Negative.   Cardiovascular: Negative.   Gastrointestinal: Negative.   Endocrine: Negative.   Genitourinary: Negative.    Musculoskeletal: Negative.   Skin: Negative.   Neurological: Negative.   Hematological: Negative.   Psychiatric/Behavioral: Negative.     Physical Exam:  weight is 164 lb (74.4 kg). Her oral temperature is 97.7 F (36.5 C). Her blood pressure is 163/81 (abnormal) and her pulse is 73. Her respiration is 18 and oxygen saturation is 97%.   Wt Readings from Last 3 Encounters:  01/19/21 164 lb (74.4 kg)  12/08/20 164 lb (74.4 kg)  09/18/20 165 lb (74.8 kg)    Physical Exam Vitals reviewed.  HENT:     Head: Normocephalic and atraumatic.  Eyes:     Pupils: Pupils are equal, round, and reactive to light.  Cardiovascular:     Rate and Rhythm: Normal rate and regular rhythm.     Heart sounds: Normal heart sounds.  Pulmonary:     Effort: Pulmonary effort is normal.     Breath sounds:  Normal breath sounds.  Abdominal:     General: Bowel sounds are normal.     Palpations: Abdomen is soft.  Musculoskeletal:        General: No tenderness or deformity. Normal range of motion.     Cervical back: Normal range of motion.  Lymphadenopathy:     Cervical: No cervical adenopathy.  Skin:    General: Skin is warm and dry.     Findings: No  erythema or rash.  Neurological:     Mental Status: She is alert and oriented to person, place, and time.  Psychiatric:        Behavior: Behavior normal.        Thought Content: Thought content normal.        Judgment: Judgment normal.     Lab Results  Component Value Date   WBC 9.5 01/19/2021   HGB 13.2 01/19/2021   HCT 42.3 01/19/2021   MCV 90.0 01/19/2021   PLT 299 01/19/2021     Chemistry      Component Value Date/Time   NA 136 12/08/2020 1400   NA 141 11/03/2015 1117   K 4.1 12/08/2020 1400   CL 104 12/08/2020 1400   CO2 21 (L) 12/08/2020 1400   BUN 10 12/08/2020 1400   BUN 22 11/03/2015 1117   CREATININE 0.97 12/08/2020 1400   CREATININE 1.01 (H) 09/18/2020 0748      Component Value Date/Time   CALCIUM 9.2 12/08/2020 1400   ALKPHOS 108 09/18/2020 0748   AST 18 09/18/2020 0748   ALT 11 09/18/2020 0748   BILITOT 0.5 09/18/2020 0748      Impression and Plan: [name] is a 75 year old white female with a pulmonary emboli and lower extremity thromboembolic disease.  She has a transiently  positive lupus anticoagulant.   I do not see a problem with her having surgery for the left knee.  She got through the right knee surgery without any issues.  Hopefully, she will have a good year.  She will have a nice quiet year although surgery sounds like it will be sometime this year for the left knee.  We will get her back in 4 months.  I think this is reasonable for follow-up.   Volanda Napoleon, MD 2/1/20228:05 AM

## 2021-01-20 LAB — LUPUS ANTICOAGULANT PANEL
DRVVT: 45.3 s (ref 0.0–47.0)
PTT Lupus Anticoagulant: 28.3 s (ref 0.0–51.9)

## 2021-01-26 ENCOUNTER — Other Ambulatory Visit: Payer: Self-pay | Admitting: Physician Assistant

## 2021-01-26 ENCOUNTER — Ambulatory Visit: Payer: Medicare Other | Admitting: Physician Assistant

## 2021-05-19 ENCOUNTER — Encounter: Payer: Self-pay | Admitting: Hematology & Oncology

## 2021-05-19 ENCOUNTER — Telehealth: Payer: Self-pay

## 2021-05-19 ENCOUNTER — Inpatient Hospital Stay (HOSPITAL_BASED_OUTPATIENT_CLINIC_OR_DEPARTMENT_OTHER): Payer: Medicare Other | Admitting: Hematology & Oncology

## 2021-05-19 ENCOUNTER — Inpatient Hospital Stay: Payer: Medicare Other | Attending: Hematology & Oncology

## 2021-05-19 ENCOUNTER — Other Ambulatory Visit: Payer: Self-pay

## 2021-05-19 VITALS — BP 175/74 | HR 65 | Temp 97.8°F | Resp 16 | Wt 172.0 lb

## 2021-05-19 DIAGNOSIS — I82461 Acute embolism and thrombosis of right calf muscular vein: Secondary | ICD-10-CM | POA: Insufficient documentation

## 2021-05-19 DIAGNOSIS — Z86711 Personal history of pulmonary embolism: Secondary | ICD-10-CM | POA: Insufficient documentation

## 2021-05-19 DIAGNOSIS — Z7901 Long term (current) use of anticoagulants: Secondary | ICD-10-CM | POA: Diagnosis not present

## 2021-05-19 DIAGNOSIS — N183 Chronic kidney disease, stage 3 unspecified: Secondary | ICD-10-CM

## 2021-05-19 DIAGNOSIS — I2699 Other pulmonary embolism without acute cor pulmonale: Secondary | ICD-10-CM

## 2021-05-19 DIAGNOSIS — Z886 Allergy status to analgesic agent status: Secondary | ICD-10-CM | POA: Diagnosis not present

## 2021-05-19 DIAGNOSIS — E1122 Type 2 diabetes mellitus with diabetic chronic kidney disease: Secondary | ICD-10-CM | POA: Diagnosis not present

## 2021-05-19 DIAGNOSIS — Z79899 Other long term (current) drug therapy: Secondary | ICD-10-CM | POA: Diagnosis not present

## 2021-05-19 DIAGNOSIS — D6862 Lupus anticoagulant syndrome: Secondary | ICD-10-CM | POA: Insufficient documentation

## 2021-05-19 DIAGNOSIS — M329 Systemic lupus erythematosus, unspecified: Secondary | ICD-10-CM | POA: Diagnosis not present

## 2021-05-19 DIAGNOSIS — J449 Chronic obstructive pulmonary disease, unspecified: Secondary | ICD-10-CM | POA: Insufficient documentation

## 2021-05-19 LAB — CBC WITH DIFFERENTIAL (CANCER CENTER ONLY)
Abs Immature Granulocytes: 0.02 10*3/uL (ref 0.00–0.07)
Basophils Absolute: 0.1 10*3/uL (ref 0.0–0.1)
Basophils Relative: 1 %
Eosinophils Absolute: 0.2 10*3/uL (ref 0.0–0.5)
Eosinophils Relative: 2 %
HCT: 38.6 % (ref 36.0–46.0)
Hemoglobin: 12.5 g/dL (ref 12.0–15.0)
Immature Granulocytes: 0 %
Lymphocytes Relative: 61 %
Lymphs Abs: 6 10*3/uL — ABNORMAL HIGH (ref 0.7–4.0)
MCH: 29.2 pg (ref 26.0–34.0)
MCHC: 32.4 g/dL (ref 30.0–36.0)
MCV: 90.2 fL (ref 80.0–100.0)
Monocytes Absolute: 0.9 10*3/uL (ref 0.1–1.0)
Monocytes Relative: 10 %
Neutro Abs: 2.5 10*3/uL (ref 1.7–7.7)
Neutrophils Relative %: 26 %
Platelet Count: 280 10*3/uL (ref 150–400)
RBC: 4.28 MIL/uL (ref 3.87–5.11)
RDW: 14.5 % (ref 11.5–15.5)
WBC Count: 9.6 10*3/uL (ref 4.0–10.5)
nRBC: 0 % (ref 0.0–0.2)

## 2021-05-19 LAB — CMP (CANCER CENTER ONLY)
ALT: 12 U/L (ref 0–44)
AST: 23 U/L (ref 15–41)
Albumin: 3.9 g/dL (ref 3.5–5.0)
Alkaline Phosphatase: 99 U/L (ref 38–126)
Anion gap: 6 (ref 5–15)
BUN: 15 mg/dL (ref 8–23)
CO2: 30 mmol/L (ref 22–32)
Calcium: 9.7 mg/dL (ref 8.9–10.3)
Chloride: 107 mmol/L (ref 98–111)
Creatinine: 0.95 mg/dL (ref 0.44–1.00)
GFR, Estimated: >60 mL/min (ref >60)
Glucose, Bld: 87 mg/dL (ref 70–99)
Potassium: 4 mmol/L (ref 3.5–5.1)
Sodium: 143 mmol/L (ref 135–145)
Total Bilirubin: 0.3 mg/dL (ref 0.3–1.2)
Total Protein: 6.4 g/dL — ABNORMAL LOW (ref 6.5–8.1)

## 2021-05-19 LAB — D-DIMER, QUANTITATIVE: D-Dimer, Quant: 0.93 ug/mL-FEU — ABNORMAL HIGH (ref 0.00–0.50)

## 2021-05-19 NOTE — Progress Notes (Signed)
Hematology and Oncology Follow Up Visit  [name] [id number] 09-23-1946 75 y.o. 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 q day -- maintanence on 09/06/2018  EC ASA 81 mg po q day     Interim History:  [name] is back for follow-up.  We last saw her back in February.  However things been going okay.  She still is going need to have surgery for the left knee.  It is causing her quite a bit of problems.  She is not sure when this is going to happen with respect to surgery.  She has been mowing the yard.  She does have a runny lumbar which she enjoys.  She does have underlying COPD.  Thankfully, she did not have any problems with COPD this spring despite the heavy pollen.  She did have a nice Memorial Day weekend with her family.  Is been no bleeding.  She is on low-dose Xarelto and baby aspirin.  These both seem to be working quite nicely for her.  She has had no problems bowels or bladder.  She has little bit of urgency with respect to urination.  She has had no issues with COVID.  Overall, her performance status is ECOG 1.    Medications:  Current Outpatient Medications:  .  acetaminophen (TYLENOL) 650 MG CR tablet, Take 650 mg by mouth every 8 (eight) hours as needed for pain. (Patient not taking: Reported on 01/19/2021), Disp: , Rfl:  .  ADVAIR DISKUS 250-50 MCG/DOSE AEPB, Inhale one puff into the lungs 2 (two) times daily., Disp: 60 each, Rfl: 3 .  albuterol (PROAIR HFA) 108 (90 BASE) MCG/ACT inhaler, Inhale 2 puffs into the lungs every 4 (four) hours as needed for wheezing or shortness of breath., Disp: 2 Inhaler, Rfl: prn .  albuterol (PROVENTIL) (2.5 MG/3ML) 0.083% nebulizer solution, Take 3 mLs (2.5 mg total) by nebulization every 6 (six) hours as needed for wheezing or shortness of  breath. (Patient not taking: Reported on 01/19/2021), Disp: 1080 mL, Rfl: 3 .  amLODipine (NORVASC) 10 MG tablet, Take 10 mg by mouth daily. , Disp: , Rfl:  .  aspirin EC 81 MG tablet, Take 81 mg by mouth daily. Swallow whole., Disp: , Rfl:  .  budesonide (PULMICORT) 0.25 MG/2ML nebulizer solution, Take 2 mLs (0.25 mg total) by nebulization 2 (two) times daily. Dx: J45.20, Disp: 120 mL, Rfl: 5 .  buPROPion (WELLBUTRIN XL) 300 MG 24 hr tablet, Take 300 mg by mouth every morning., Disp: , Rfl:  .  co-enzyme Q-10 30 MG capsule, Take 30 mg by mouth 3 (three) times daily. Gummies, Disp: , Rfl:  .  Docusate Sodium (DSS) 250 MG CAPS, Take 250 mg by mouth daily., Disp: , Rfl:  .  donepezil (ARICEPT) 10 MG tablet, Take 10 mg by mouth at bedtime., Disp: , Rfl:  .  famotidine (PEPCID) 20 MG tablet, TAKE ONE TABLET BY MOUTH AT BEDTIME (Patient not taking: Reported on 01/19/2021), Disp: 30 tablet, Rfl: 1 .  ferrous sulfate 324 (65 Fe) MG TBEC, Take 1 tablet (325 mg total) by mouth daily., Disp: 90 tablet, Rfl: 3 .  fluticasone (FLONASE) 50 MCG/ACT nasal spray, INSTILL ONE SPRAY NASALLY DAILY, Disp: 48 g, Rfl: 0 .  latanoprost (XALATAN) 0.005 % ophthalmic solution, Place 1 drop into both eyes at bedtime. , Disp: ,  Rfl: 2 .  levothyroxine (SYNTHROID) 50 MCG tablet, Take 50 mcg by mouth daily., Disp: , Rfl:  .  meclizine (ANTIVERT) 25 MG tablet, Take 25-50 mg by mouth 3 (three) times daily., Disp: , Rfl:  .  memantine (NAMENDA) 5 MG tablet, Take 5 mg by mouth 2 (two) times daily., Disp: , Rfl:  .  montelukast (SINGULAIR) 10 MG tablet, Take 10 mg by mouth at bedtime., Disp: , Rfl:  .  pantoprazole (PROTONIX) 40 MG tablet, Take 40 mg by mouth daily. , Disp: , Rfl: 0 .  predniSONE (DELTASONE) 10 MG tablet, Take 10 mg by mouth daily., Disp: , Rfl:  .  pregabalin (LYRICA) 100 MG capsule, Take 100 mg by mouth 2 (two) times daily., Disp: , Rfl:  .  pregabalin (LYRICA) 75 MG capsule, TAKE ONE CAPSULE BY MOUTH TWICE DAILY,  Disp: 180 capsule, Rfl: 1 .  rOPINIRole (REQUIP) 2 MG tablet, Take 2 mg by mouth at bedtime. , Disp: , Rfl: 1 .  senna (SENOKOT) 8.6 MG TABS tablet, Take 8.6 mg by mouth at bedtime. , Disp: , Rfl:  .  venlafaxine XR (EFFEXOR-XR) 150 MG 24 hr capsule, Take 150 mg by mouth every morning., Disp: , Rfl:  .  venlafaxine XR (EFFEXOR-XR) 75 MG 24 hr capsule, Take 150 mg by mouth daily with breakfast. , Disp: , Rfl:  .  Vitamin D, Ergocalciferol, (DRISDOL) 1.25 MG (50000 UNIT) CAPS capsule, Take 50,000 Units by mouth every Sunday., Disp: , Rfl:  .  XARELTO 10 MG TABS tablet, Take 1 tablet (10 mg total) by mouth daily with supper., Disp: 30 tablet, Rfl: 12  Allergies:  Allergies  Allergen Reactions  . Naproxen Shortness Of Breath, Itching and Swelling  . Tizanidine Shortness Of Breath    dizziness  . Naprosyn [Naproxen]     Unknown reaction    Past Medical History, Surgical history, Social history, and Family History were reviewed and updated.  Review of Systems: Review of Systems  Constitutional: Negative.   HENT:  Negative.   Eyes: Negative.   Respiratory: Negative.   Cardiovascular: Negative.   Gastrointestinal: Negative.   Endocrine: Negative.   Genitourinary: Negative.    Musculoskeletal: Negative.   Skin: Negative.   Neurological: Negative.   Hematological: Negative.   Psychiatric/Behavioral: Negative.     Physical Exam:  vitals were not taken for this visit.   Wt Readings from Last 3 Encounters:  01/19/21 164 lb (74.4 kg)  12/08/20 164 lb (74.4 kg)  09/18/20 165 lb (74.8 kg)    Physical Exam Vitals reviewed.  HENT:     Head: Normocephalic and atraumatic.  Eyes:     Pupils: Pupils are equal, round, and reactive to light.  Cardiovascular:     Rate and Rhythm: Normal rate and regular rhythm.     Heart sounds: Normal heart sounds.  Pulmonary:     Effort: Pulmonary effort is normal.     Breath sounds: Normal breath sounds.  Abdominal:     General: Bowel sounds are  normal.     Palpations: Abdomen is soft.  Musculoskeletal:        General: No tenderness or deformity. Normal range of motion.     Cervical back: Normal range of motion.  Lymphadenopathy:     Cervical: No cervical adenopathy.  Skin:    General: Skin is warm and dry.     Findings: No erythema or rash.  Neurological:     Mental Status: She is alert and oriented  to person, place, and time.  Psychiatric:        Behavior: Behavior normal.        Thought Content: Thought content normal.        Judgment: Judgment normal.     Lab Results  Component Value Date   WBC 9.6 05/19/2021   HGB 12.5 05/19/2021   HCT 38.6 05/19/2021   MCV 90.2 05/19/2021   PLT 280 05/19/2021     Chemistry      Component Value Date/Time   NA 143 01/19/2021 0743   NA 141 11/03/2015 1117   K 3.9 01/19/2021 0743   CL 105 01/19/2021 0743   CO2 28 01/19/2021 0743   BUN 16 01/19/2021 0743   BUN 22 11/03/2015 1117   CREATININE 0.98 01/19/2021 0743      Component Value Date/Time   CALCIUM 10.0 01/19/2021 0743   ALKPHOS 105 01/19/2021 0743   AST 21 01/19/2021 0743   ALT 11 01/19/2021 0743   BILITOT 0.4 01/19/2021 0743      Impression and Plan: Ms. Gruner is a 75 year old white female with a pulmonary emboli and lower extremity thromboembolic disease.  She has a transiently  positive lupus anticoagulant.   She has gained some weight.  Hopefully this will not affect her having surgery for the left knee.  Again I do not have any problems with her having surgery for the left knee.  For right now, we will just plan to get her back in another 4 months or so.  We will have her get through the summertime.   Volanda Napoleon, MD 6/1/20228:07 AM

## 2021-05-19 NOTE — Telephone Encounter (Signed)
appts made per 05/19/21 los and printed for pt  [name]

## 2021-06-24 IMAGING — DX DG CHEST 2V
2 series · 2 of 2 positions shown · non-contrast
Comparison: Chest x-ray 05/06/2019.

CLINICAL DATA: 74-year-old female with history of asthma.

EXAM:
CHEST - 2 VIEW

[chest pa]
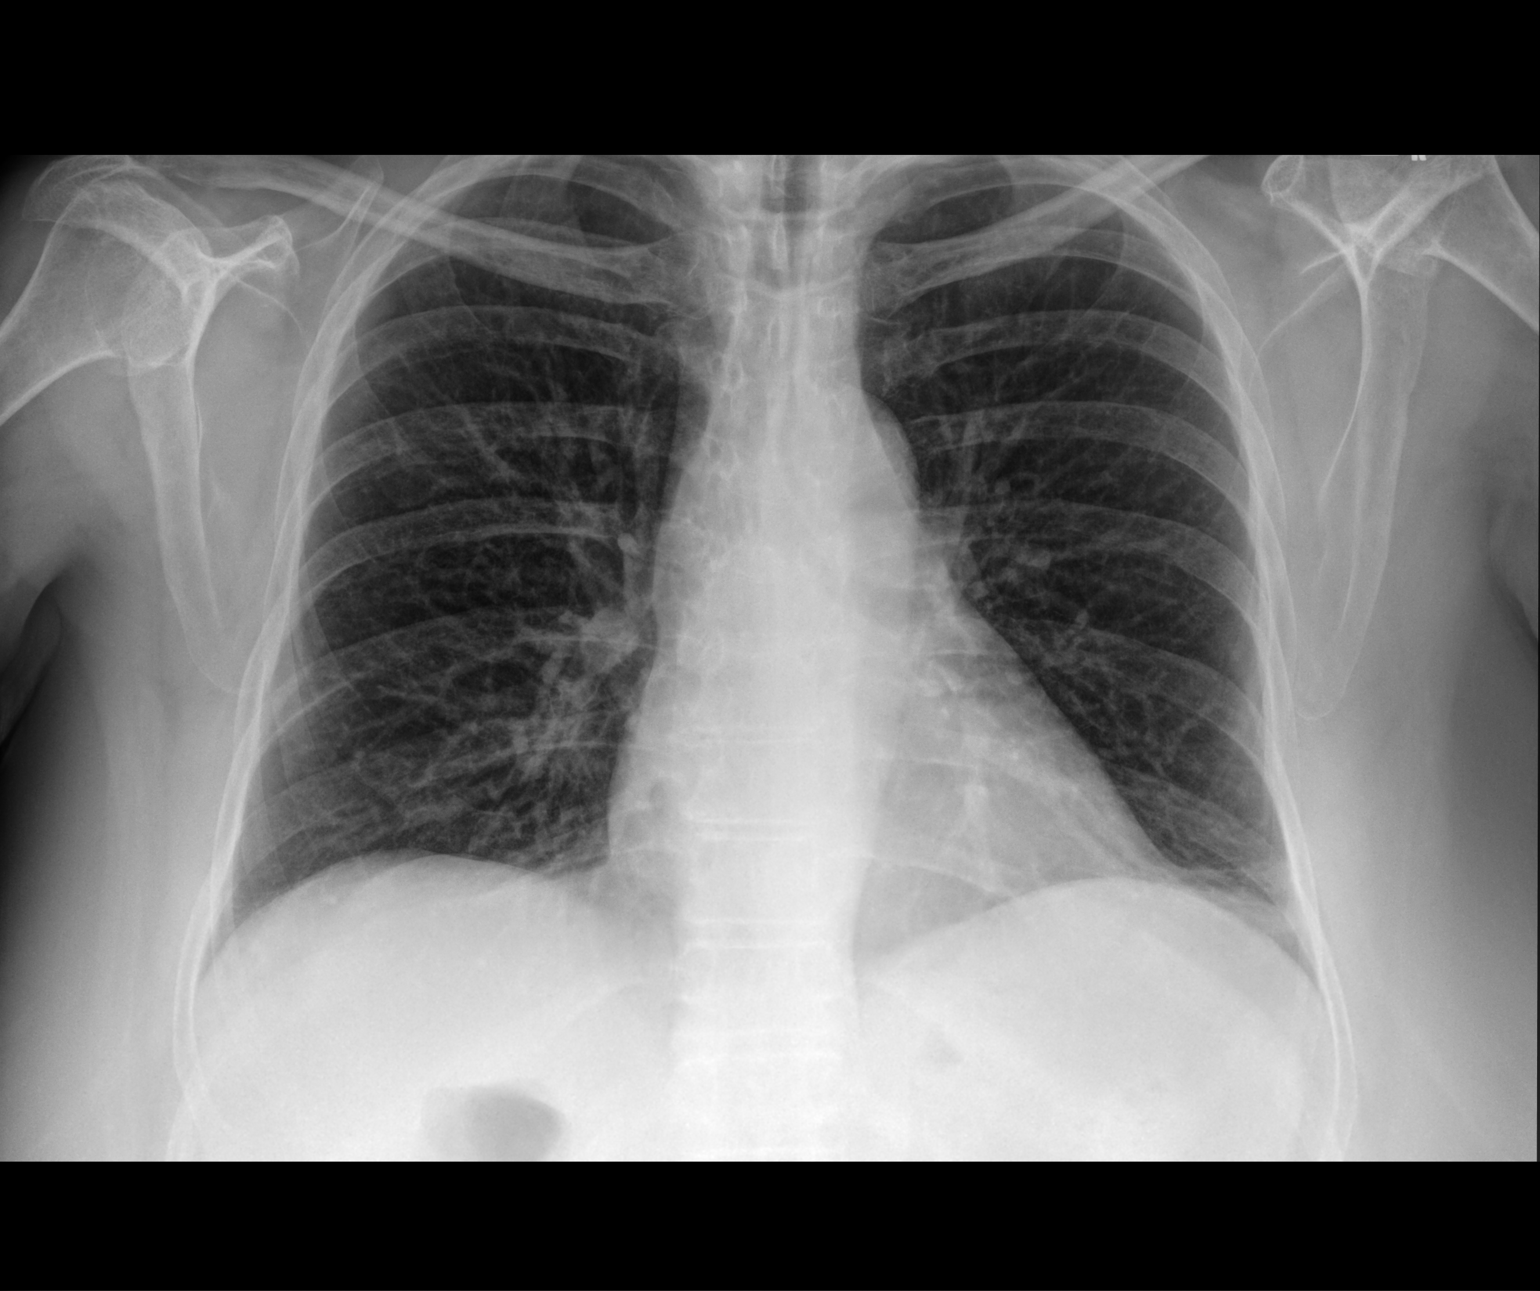

[chest lat]
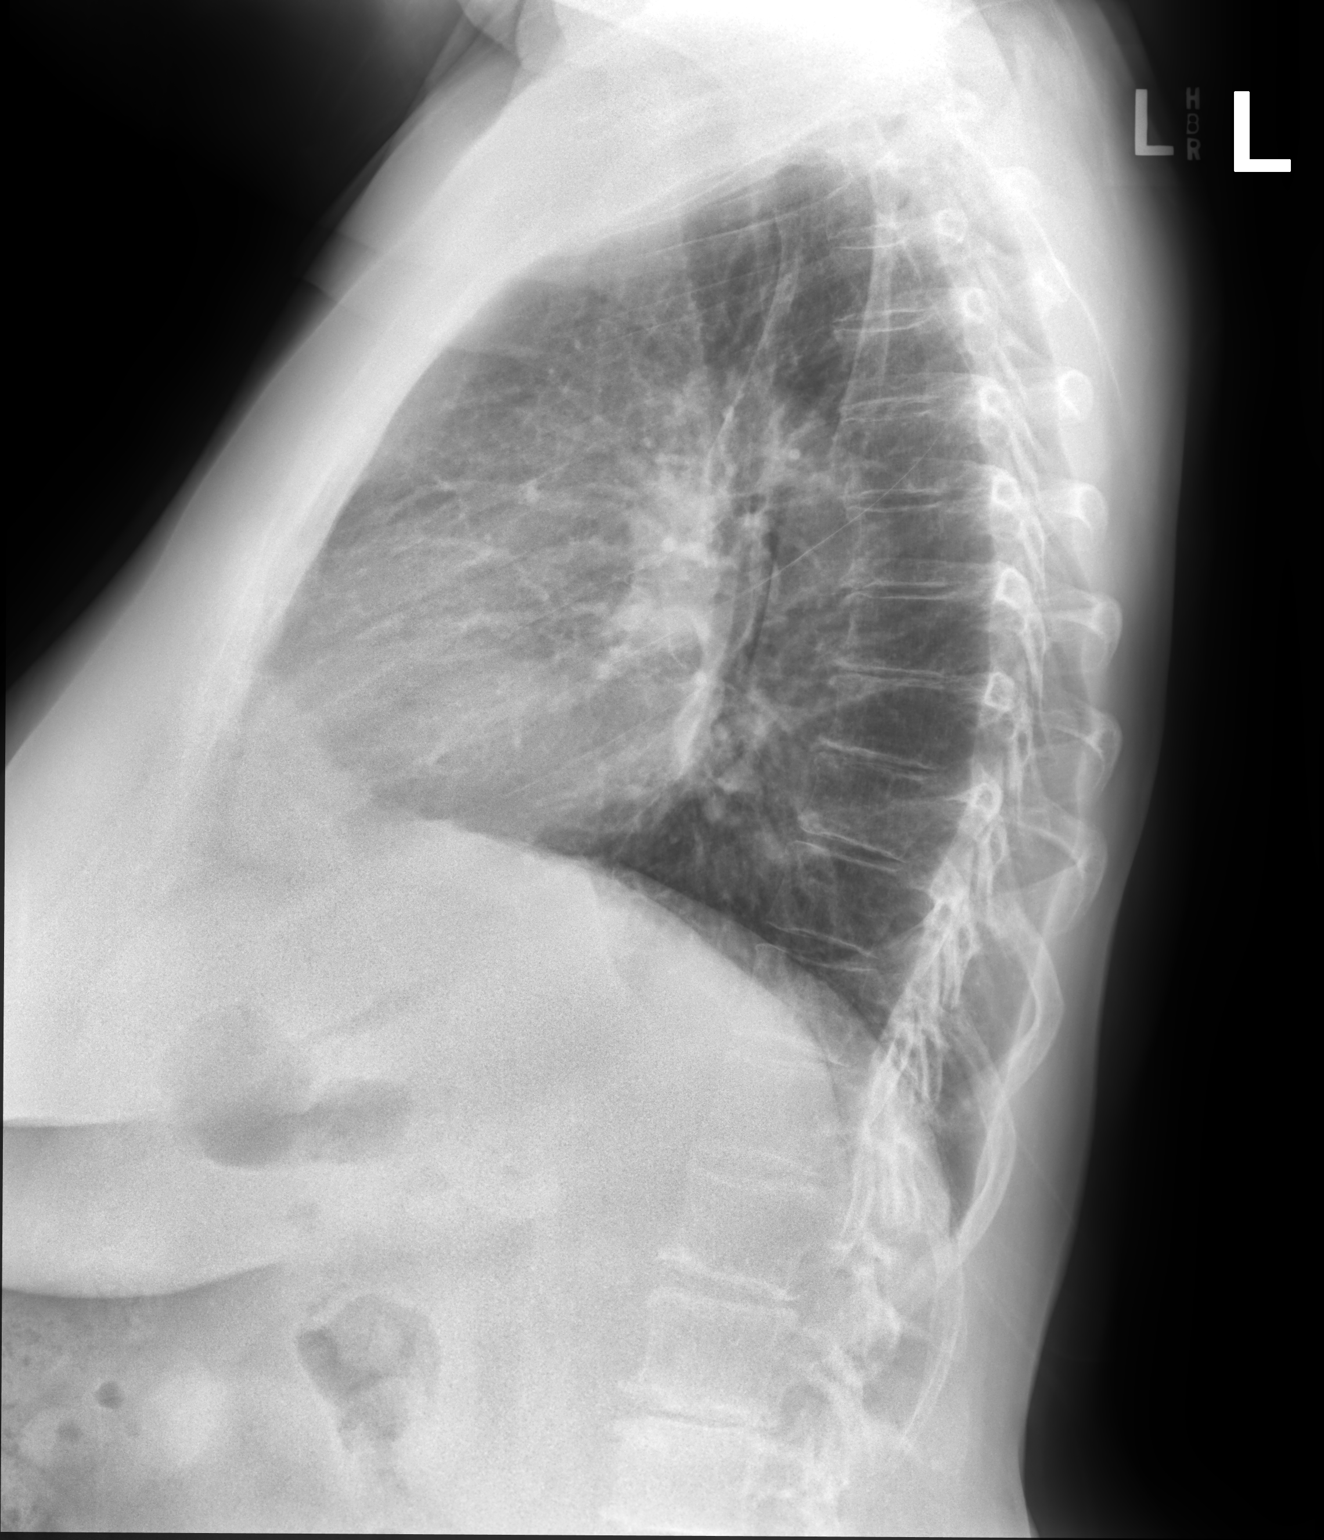

[2 of 2 positions shown; findings below may reference images not displayed]

FINDINGS: Lung volumes are normal. No consolidative airspace disease. No
pleural effusions. No pneumothorax. No pulmonary nodule or mass
noted. Pulmonary vasculature and the cardiomediastinal silhouette
are within normal limits.
IMPRESSION: 1.  No radiographic evidence of acute cardiopulmonary disease.

## 2021-07-04 IMAGING — DX DG CHEST 1V PORT
1 series · 1 of 1 positions shown · non-contrast
Comparison: 06/02/2020

CLINICAL DATA: Shortness of breath, recent orthopedic surgery

EXAM:
PORTABLE CHEST 1 VIEW

[chest ap]
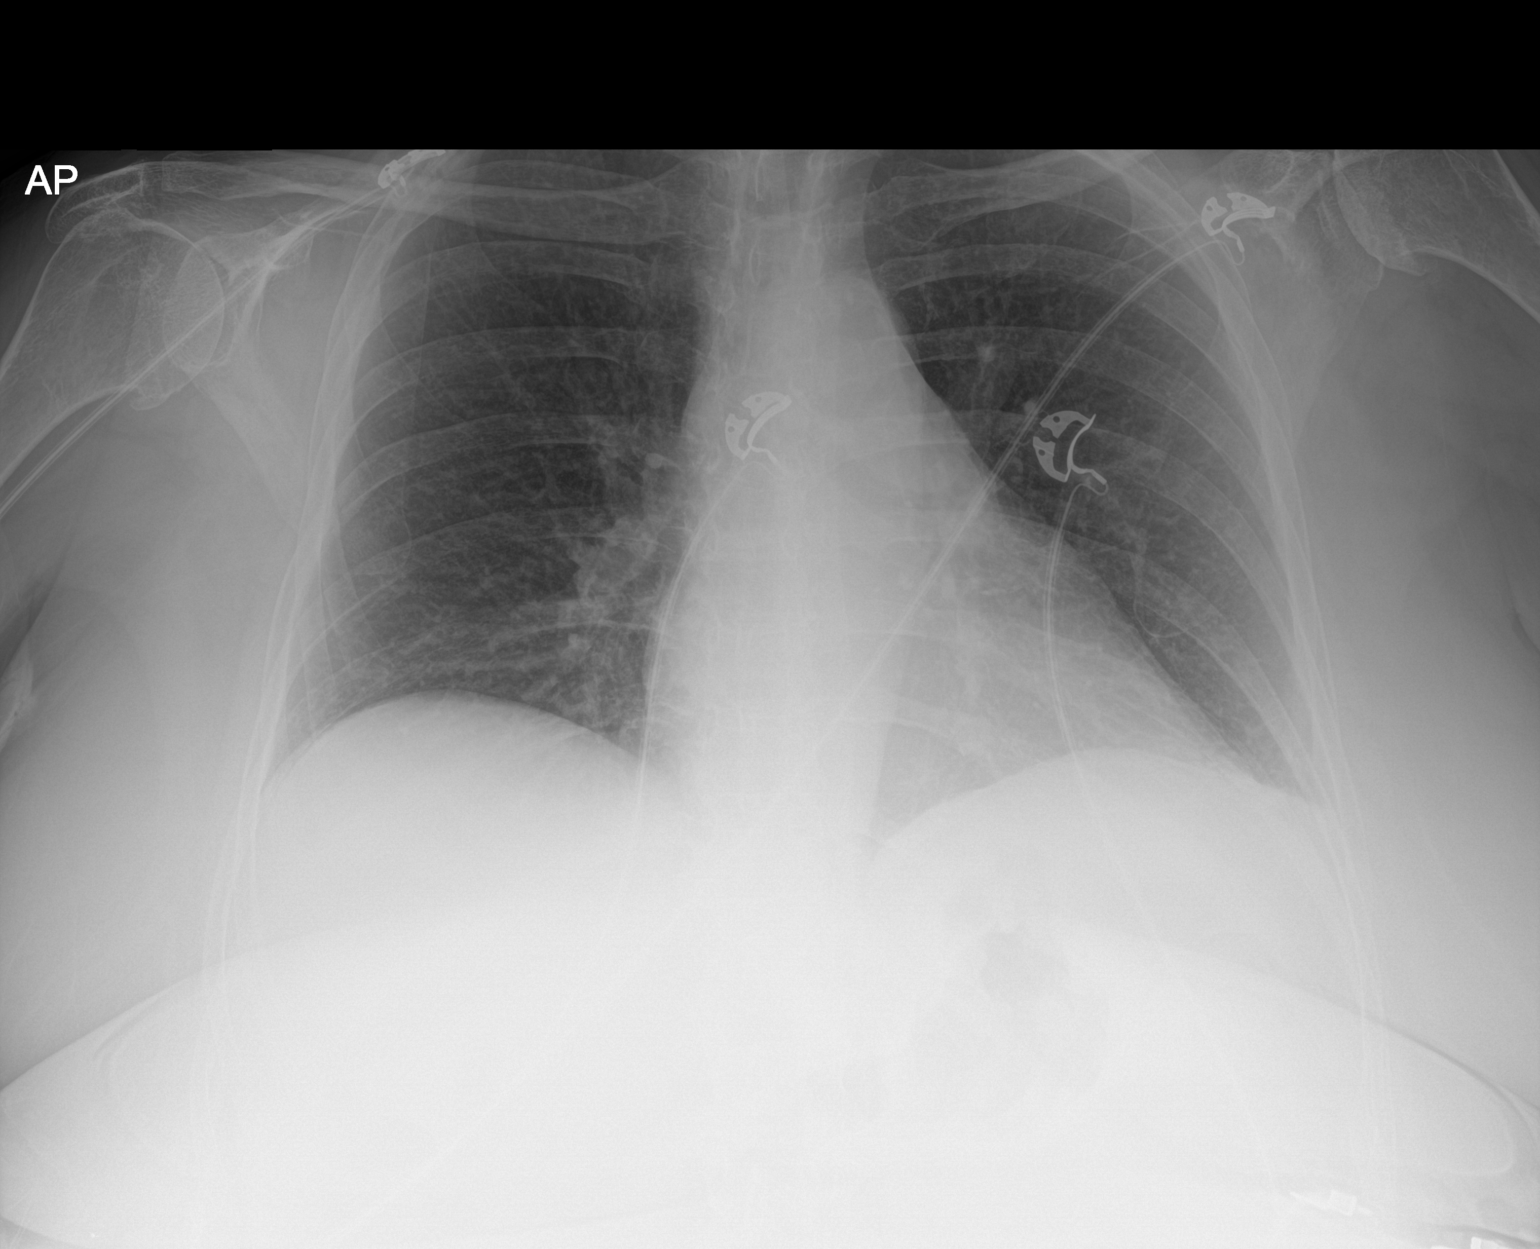

[1 of 1 positions shown; findings below may reference images not displayed]

FINDINGS: The heart size and mediastinal contours are within normal limits.
Low lung volumes with minimal left basilar atelectasis. No pleural
effusion or pneumothorax. Degenerative changes of the shoulders,
left worse than right.
IMPRESSION: No active disease.

## 2021-08-22 ENCOUNTER — Telehealth: Payer: Self-pay

## 2021-08-22 NOTE — Telephone Encounter (Signed)
Spoke with patient and she stated that she had her Medicare Wellness Visit with a non [facility] provider.

## 2021-09-20 ENCOUNTER — Other Ambulatory Visit: Payer: Self-pay | Admitting: Hematology & Oncology

## 2021-09-22 ENCOUNTER — Inpatient Hospital Stay (HOSPITAL_BASED_OUTPATIENT_CLINIC_OR_DEPARTMENT_OTHER): Payer: Medicare Other | Admitting: Hematology & Oncology

## 2021-09-22 ENCOUNTER — Other Ambulatory Visit: Payer: Self-pay

## 2021-09-22 ENCOUNTER — Encounter: Payer: Self-pay | Admitting: Hematology & Oncology

## 2021-09-22 ENCOUNTER — Inpatient Hospital Stay: Payer: Medicare Other | Attending: Hematology & Oncology

## 2021-09-22 VITALS — BP 150/75 | HR 72 | Temp 97.8°F | Resp 16 | Wt 174.0 lb

## 2021-09-22 DIAGNOSIS — M1990 Unspecified osteoarthritis, unspecified site: Secondary | ICD-10-CM | POA: Diagnosis not present

## 2021-09-22 DIAGNOSIS — N183 Chronic kidney disease, stage 3 unspecified: Secondary | ICD-10-CM | POA: Insufficient documentation

## 2021-09-22 DIAGNOSIS — R059 Cough, unspecified: Secondary | ICD-10-CM | POA: Insufficient documentation

## 2021-09-22 DIAGNOSIS — M329 Systemic lupus erythematosus, unspecified: Secondary | ICD-10-CM | POA: Insufficient documentation

## 2021-09-22 DIAGNOSIS — D6862 Lupus anticoagulant syndrome: Secondary | ICD-10-CM | POA: Diagnosis not present

## 2021-09-22 DIAGNOSIS — G459 Transient cerebral ischemic attack, unspecified: Secondary | ICD-10-CM | POA: Diagnosis not present

## 2021-09-22 DIAGNOSIS — Z7901 Long term (current) use of anticoagulants: Secondary | ICD-10-CM | POA: Diagnosis not present

## 2021-09-22 DIAGNOSIS — Z7982 Long term (current) use of aspirin: Secondary | ICD-10-CM | POA: Insufficient documentation

## 2021-09-22 DIAGNOSIS — E1122 Type 2 diabetes mellitus with diabetic chronic kidney disease: Secondary | ICD-10-CM

## 2021-09-22 DIAGNOSIS — I2699 Other pulmonary embolism without acute cor pulmonale: Secondary | ICD-10-CM | POA: Diagnosis present

## 2021-09-22 DIAGNOSIS — Z79899 Other long term (current) drug therapy: Secondary | ICD-10-CM | POA: Insufficient documentation

## 2021-09-22 DIAGNOSIS — I2601 Septic pulmonary embolism with acute cor pulmonale: Secondary | ICD-10-CM | POA: Diagnosis not present

## 2021-09-22 DIAGNOSIS — Z886 Allergy status to analgesic agent status: Secondary | ICD-10-CM | POA: Insufficient documentation

## 2021-09-22 LAB — CBC WITH DIFFERENTIAL (CANCER CENTER ONLY)
Abs Immature Granulocytes: 0.01 10*3/uL (ref 0.00–0.07)
Basophils Absolute: 0.1 10*3/uL (ref 0.0–0.1)
Basophils Relative: 1 %
Eosinophils Absolute: 0.1 10*3/uL (ref 0.0–0.5)
Eosinophils Relative: 2 %
HCT: 39.5 % (ref 36.0–46.0)
Hemoglobin: 12.8 g/dL (ref 12.0–15.0)
Immature Granulocytes: 0 %
Lymphocytes Relative: 65 %
Lymphs Abs: 6 10*3/uL — ABNORMAL HIGH (ref 0.7–4.0)
MCH: 29.6 pg (ref 26.0–34.0)
MCHC: 32.4 g/dL (ref 30.0–36.0)
MCV: 91.4 fL (ref 80.0–100.0)
Monocytes Absolute: 0.5 10*3/uL (ref 0.1–1.0)
Monocytes Relative: 5 %
Neutro Abs: 2.4 10*3/uL (ref 1.7–7.7)
Neutrophils Relative %: 27 %
Platelet Count: 269 10*3/uL (ref 150–400)
RBC: 4.32 MIL/uL (ref 3.87–5.11)
RDW: 14.5 % (ref 11.5–15.5)
WBC Count: 9.2 10*3/uL (ref 4.0–10.5)
nRBC: 0 % (ref 0.0–0.2)

## 2021-09-22 LAB — CMP (CANCER CENTER ONLY)
ALT: 12 U/L (ref 0–44)
AST: 25 U/L (ref 15–41)
Albumin: 4 g/dL (ref 3.5–5.0)
Alkaline Phosphatase: 97 U/L (ref 38–126)
Anion gap: 7 (ref 5–15)
BUN: 16 mg/dL (ref 8–23)
CO2: 30 mmol/L (ref 22–32)
Calcium: 9.5 mg/dL (ref 8.9–10.3)
Chloride: 106 mmol/L (ref 98–111)
Creatinine: 1.04 mg/dL — ABNORMAL HIGH (ref 0.44–1.00)
GFR, Estimated: 57 mL/min — ABNORMAL LOW (ref >60)
Glucose, Bld: 90 mg/dL (ref 70–99)
Potassium: 4 mmol/L (ref 3.5–5.1)
Sodium: 143 mmol/L (ref 135–145)
Total Bilirubin: 0.4 mg/dL (ref 0.3–1.2)
Total Protein: 6.6 g/dL (ref 6.5–8.1)

## 2021-09-22 LAB — IRON AND TIBC
Iron: 60 ug/dL (ref 41–142)
Saturation Ratios: 24 % (ref 21–57)
TIBC: 249 ug/dL (ref 236–444)
UIBC: 188 ug/dL (ref 120–384)

## 2021-09-22 LAB — FERRITIN: Ferritin: 124 ng/mL (ref 11–307)

## 2021-09-22 NOTE — Progress Notes (Signed)
Hematology and Oncology Follow Up Visit  DEANNE BEDGOOD [id number] [birth year] 75 y.o. 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 q day -- maintanence on 09/06/2018 EC ASA 81 mg po q day     Interim History:  Ms. Walski is back for follow-up.  We saw her back in June.  She had a decent summer.  Unfortunately, a brother passed away in 08/08/23.  He had bad arthritis.  She does have problems with asthma.  She does cough.  She does have inhalers and nebulizers at home.  She is on Xarelto and baby aspirin.  Last time I saw her, there is no lupus anticoagulant in her system.  She has had no fever.  There is been no problems with COVID.  She had no change in bowel or bladder habits.  She said that she had a mammogram done about 3 weeks ago.  She said everything looked okay.  There is no bleeding.  She has had no headache.  Overall, I would say performance status is ECOG 1.     Medications:  Current Outpatient Medications:    benzonatate (TESSALON) 100 MG capsule, Take 100 mg by mouth 3 (three) times daily as needed., Disp: , Rfl:    acetaminophen (TYLENOL) 650 MG CR tablet, Take 650 mg by mouth every 8 (eight) hours as needed for pain. (Patient not taking: Reported on 01/19/2021), Disp: , Rfl:    ADVAIR DISKUS 250-50 MCG/DOSE AEPB, Inhale one puff into the lungs 2 (two) times daily., Disp: 60 each, Rfl: 3   albuterol (PROAIR HFA) 108 (90 BASE) MCG/ACT inhaler, Inhale 2 puffs into the lungs every 4 (four) hours as needed for wheezing or shortness of breath., Disp: 2 Inhaler, Rfl: prn   albuterol (PROVENTIL) (2.5 MG/3ML) 0.083% nebulizer solution, Take 3 mLs (2.5 mg total) by nebulization every 6 (six) hours as needed for wheezing or shortness of breath. (Patient not taking: Reported on 01/19/2021), Disp: 1080 mL, Rfl: 3    amLODipine (NORVASC) 10 MG tablet, Take 10 mg by mouth daily. , Disp: , Rfl:    aspirin EC 81 MG tablet, Take 81 mg by mouth daily. Swallow whole., Disp: , Rfl:    budesonide (PULMICORT) 0.25 MG/2ML nebulizer solution, Take 2 mLs (0.25 mg total) by nebulization 2 (two) times daily. Dx: J45.20, Disp: 120 mL, Rfl: 5   buPROPion (WELLBUTRIN XL) 300 MG 24 hr tablet, Take 300 mg by mouth every morning., Disp: , Rfl:    co-enzyme Q-10 30 MG capsule, Take 30 mg by mouth 3 (three) times daily. Gummies, Disp: , Rfl:    Dextromethorphan-guaiFENesin 5-100 MG/5ML LIQD, Take by mouth., Disp: , Rfl:    diphenhydrAMINE (SOMINEX) 25 MG tablet, Take 25 mg by mouth at bedtime as needed., Disp: , Rfl:    Docusate Sodium (DSS) 250 MG CAPS, Take 250 mg by mouth daily., Disp: , Rfl:    donepezil (ARICEPT) 10 MG tablet, Take 10 mg by mouth at bedtime., Disp: , Rfl:    famotidine (PEPCID) 20 MG tablet, TAKE ONE TABLET BY MOUTH AT BEDTIME (Patient not taking: Reported on 01/19/2021), Disp: 30 tablet, Rfl: 1   ferrous sulfate 324 (65 Fe) MG TBEC, Take 1 tablet (325 mg total) by mouth daily., Disp: 90 tablet, Rfl: 3   fluticasone (FLONASE) 50 MCG/ACT nasal spray, INSTILL ONE SPRAY NASALLY DAILY, Disp: 48 g, Rfl:  0   latanoprost (XALATAN) 0.005 % ophthalmic solution, Place 1 drop into both eyes at bedtime. , Disp: , Rfl: 2   levothyroxine (SYNTHROID) 50 MCG tablet, Take 50 mcg by mouth daily., Disp: , Rfl:    meclizine (ANTIVERT) 25 MG tablet, Take 25-50 mg by mouth 3 (three) times daily., Disp: , Rfl:    memantine (NAMENDA) 5 MG tablet, Take 5 mg by mouth 2 (two) times daily., Disp: , Rfl:    montelukast (SINGULAIR) 10 MG tablet, Take 10 mg by mouth at bedtime., Disp: , Rfl:    pantoprazole (PROTONIX) 40 MG tablet, Take 40 mg by mouth daily. , Disp: , Rfl: 0   pregabalin (LYRICA) 100 MG capsule, Take 100 mg by mouth 2 (two) times daily., Disp: , Rfl:    pregabalin (LYRICA) 75 MG capsule, TAKE ONE CAPSULE BY MOUTH TWICE  DAILY, Disp: 180 capsule, Rfl: 1   rOPINIRole (REQUIP) 2 MG tablet, Take 2 mg by mouth at bedtime. , Disp: , Rfl: 1   senna (SENOKOT) 8.6 MG TABS tablet, Take 8.6 mg by mouth at bedtime. , Disp: , Rfl:    venlafaxine XR (EFFEXOR-XR) 150 MG 24 hr capsule, Take 150 mg by mouth every morning., Disp: , Rfl:    Vitamin D, Ergocalciferol, (DRISDOL) 1.25 MG (50000 UNIT) CAPS capsule, Take 50,000 Units by mouth every Sunday., Disp: , Rfl:    XARELTO 10 MG TABS tablet, TAKE ONE TABLET BY MOUTH EVERY DAY WITH SUPPER, Disp: 30 tablet, Rfl: 12  Allergies:  Allergies  Allergen Reactions   Naproxen Shortness Of Breath, Itching and Swelling   Tizanidine Shortness Of Breath    dizziness   Naprosyn [Naproxen]     Unknown reaction    Past Medical History, Surgical history, Social history, and Family History were reviewed and updated.  Review of Systems: Review of Systems  Constitutional: Negative.   HENT:  Negative.    Eyes: Negative.   Respiratory: Negative.    Cardiovascular: Negative.   Gastrointestinal: Negative.   Endocrine: Negative.   Genitourinary: Negative.    Musculoskeletal: Negative.   Skin: Negative.   Neurological: Negative.   Hematological: Negative.   Psychiatric/Behavioral: Negative.     Physical Exam:  weight is 174 lb (78.9 kg). Her oral temperature is 97.8 F (36.6 C). Her blood pressure is 150/75 (abnormal) and her pulse is 72. Her respiration is 16 and oxygen saturation is 100%.   Wt Readings from Last 3 Encounters:  09/22/21 174 lb (78.9 kg)  05/19/21 172 lb (78 kg)  01/19/21 164 lb (74.4 kg)    Physical Exam Vitals reviewed.  HENT:     Head: Normocephalic and atraumatic.  Eyes:     Pupils: Pupils are equal, round, and reactive to light.  Cardiovascular:     Rate and Rhythm: Normal rate and regular rhythm.     Heart sounds: Normal heart sounds.  Pulmonary:     Effort: Pulmonary effort is normal.     Breath sounds: Normal breath sounds.  Abdominal:      General: Bowel sounds are normal.     Palpations: Abdomen is soft.  Musculoskeletal:        General: No tenderness or deformity. Normal range of motion.     Cervical back: Normal range of motion.  Lymphadenopathy:     Cervical: No cervical adenopathy.  Skin:    General: Skin is warm and dry.     Findings: No erythema or rash.  Neurological:  Mental Status: She is alert and oriented to person, place, and time.  Psychiatric:        Behavior: Behavior normal.        Thought Content: Thought content normal.        Judgment: Judgment normal.    Lab Results  Component Value Date   WBC 9.2 09/22/2021   HGB 12.8 09/22/2021   HCT 39.5 09/22/2021   MCV 91.4 09/22/2021   PLT 269 09/22/2021     Chemistry      Component Value Date/Time   NA 143 05/19/2021 0742   NA 141 11/03/2015 1117   K 4.0 05/19/2021 0742   CL 107 05/19/2021 0742   CO2 30 05/19/2021 0742   BUN 15 05/19/2021 0742   BUN 22 11/03/2015 1117   CREATININE 0.95 05/19/2021 0742      Component Value Date/Time   CALCIUM 9.7 05/19/2021 0742   ALKPHOS 99 05/19/2021 0742   AST 23 05/19/2021 0742   ALT 12 05/19/2021 0742   BILITOT 0.3 05/19/2021 0742      Impression and Plan: Ms. Theissen is a 75 year old white female with a pulmonary emboli and lower extremity thromboembolic disease.  She has a transiently  positive lupus anticoagulant.   She looks good.  I think she is doing pretty well.  She still has not had surgery for the left knee.  She is not sure when she will have this done.  We will plan to get her back in 6 months now.  I think everything looks pretty stable for [place] to get her back in 6 months.   Volanda Napoleon, MD 10/5/20228:19 AM

## 2021-09-23 LAB — LUPUS ANTICOAGULANT PANEL
DRVVT: 85.6 s — ABNORMAL HIGH (ref 0.0–47.0)
PTT Lupus Anticoagulant: 30.7 s (ref 0.0–51.9)

## 2021-09-23 LAB — DRVVT CONFIRM: dRVVT Confirm: 1.5 ratio — ABNORMAL HIGH (ref 0.8–1.2)

## 2021-09-23 LAB — DRVVT MIX: dRVVT Mix: 67.9 s — ABNORMAL HIGH (ref 0.0–40.4)

## 2021-10-22 ENCOUNTER — Telehealth: Payer: Medicare Other | Admitting: Nurse Practitioner

## 2021-10-22 DIAGNOSIS — J4521 Mild intermittent asthma with (acute) exacerbation: Secondary | ICD-10-CM | POA: Diagnosis not present

## 2021-10-22 MED ORDER — PREDNISONE 20 MG PO TABS: 40.0000 mg | ORAL_TABLET | Freq: Every day | ORAL | 0 refills | Status: AC

## 2021-10-22 MED ORDER — FLUTICASONE PROPIONATE 50 MCG/ACT NA SUSP: 2.0000 | Freq: Every day | NASAL | 6 refills | Status: AC

## 2021-10-22 NOTE — Progress Notes (Signed)
Virtual Visit Consent   [name], you are scheduled for a virtual visit with Mary-Margaret Hassell Done, [place], a [facility] provider, today.     Just as with appointments in the office, your consent must be obtained to participate.  Your consent will be active for this visit and any virtual visit you may have with one of our providers in the next 365 days.     If you have a MyChart account, a copy of this consent can be sent to you electronically.  All virtual visits are billed to your insurance company just like a traditional visit in the office.    As this is a virtual visit, video technology does not allow for your provider to perform a traditional examination.  This may limit your provider's ability to fully assess your condition.  If your provider identifies any concerns that need to be evaluated in person or the need to arrange testing (such as labs, EKG, etc.), we will make arrangements to do so.     Although advances in technology are sophisticated, we cannot ensure that it will always work on either your end or our end.  If the connection with a video visit is poor, the visit may have to be switched to a telephone visit.  With either a video or telephone visit, we are not always able to ensure that we have a secure connection.     I need to obtain your verbal consent now.   Are you willing to proceed with your visit today? YES   [name] has provided verbal consent on 10/22/2021 for a virtual visit (video or telephone).   Mary-Margaret Hassell Done, FNP   Date: 10/22/2021 4:47 PM   Virtual Visit via Video Note   I, Mary-Margaret Hassell Done, connected with [name] ([id number], 1946/01/04) on 10/22/21 at  4:45 PM EDT by a video-enabled telemedicine application and verified that I am speaking with the correct person using two identifiers.  Location: Patient: Virtual Visit Location Patient: Home Provider: Virtual Visit Location Provider: Mobile   I discussed the  limitations of evaluation and management by telemedicine and the availability of in person appointments. The patient expressed understanding and agreed to proceed.    History of Present Illness: [name] is a 75 y.o. who identifies as a female who was assigned female at birth, and is being seen today for cough .  HPI: Patient states that she has been congested for several days. It has caused her to be dizzy and she is having to take  her meclizine. She has a slight cough and is having to do her breathing treatments because eshe has asthma. She has not taken any OTC meds.   Review of Systems  Constitutional:  Negative for chills, fever and malaise/fatigue.  HENT:  Positive for congestion.   Respiratory:  Positive for cough and shortness of breath (slight). Negative for sputum production.   Musculoskeletal:  Negative for myalgias.  Neurological:  Positive for dizziness. Negative for headaches.  All other systems reviewed and are negative.  Problems:  Patient Active Problem List   Diagnosis Date Noted   TIA (transient ischemic attack) 07/11/2020   Dizziness 07/11/2020   RLS (restless legs syndrome) 07/11/2020   S/P total knee replacement 06/08/2020   DOE (dyspnea on exertion) 12/03/2019   CKD (chronic kidney disease) stage 3, GFR 30-59 ml/min 02/24/2018   Acute pulmonary embolism ([place]) 02/22/2018   Type 2 diabetes mellitus with stage 3 chronic kidney disease (  [place]) 02/22/2018   Elevated troponin 02/22/2018   Cyst of right orbit 01/01/2016   Left facial pain 11/03/2015   Acute maxillary sinusitis 03/08/2015   Hypothyroidism 01/23/2008   Seasonal and perennial allergic rhinitis 01/23/2008   Asthma, mild intermittent 01/23/2008   Esophageal reflux 01/23/2008   Essential hypertension 01/23/2008    Allergies:  Allergies  Allergen Reactions   Naproxen Shortness Of Breath, Itching and Swelling   Tizanidine Shortness Of Breath    dizziness   Naprosyn [Naproxen]     Unknown  reaction   Medications:  Current Outpatient Medications:    acetaminophen (TYLENOL) 650 MG CR tablet, Take 650 mg by mouth every 8 (eight) hours as needed for pain. (Patient not taking: Reported on 01/19/2021), Disp: , Rfl:    ADVAIR DISKUS 250-50 MCG/DOSE AEPB, Inhale one puff into the lungs 2 (two) times daily., Disp: 60 each, Rfl: 3   albuterol (PROAIR HFA) 108 (90 BASE) MCG/ACT inhaler, Inhale 2 puffs into the lungs every 4 (four) hours as needed for wheezing or shortness of breath., Disp: 2 Inhaler, Rfl: prn   albuterol (PROVENTIL) (2.5 MG/3ML) 0.083% nebulizer solution, Take 3 mLs (2.5 mg total) by nebulization every 6 (six) hours as needed for wheezing or shortness of breath. (Patient not taking: Reported on 01/19/2021), Disp: 1080 mL, Rfl: 3   amLODipine (NORVASC) 10 MG tablet, Take 10 mg by mouth daily. , Disp: , Rfl:    aspirin EC 81 MG tablet, Take 81 mg by mouth daily. Swallow whole., Disp: , Rfl:    benzonatate (TESSALON) 100 MG capsule, Take 100 mg by mouth 3 (three) times daily as needed., Disp: , Rfl:    budesonide (PULMICORT) 0.25 MG/2ML nebulizer solution, Take 2 mLs (0.25 mg total) by nebulization 2 (two) times daily. Dx: J45.20, Disp: 120 mL, Rfl: 5   buPROPion (WELLBUTRIN XL) 300 MG 24 hr tablet, Take 300 mg by mouth every morning., Disp: , Rfl:    co-enzyme Q-10 30 MG capsule, Take 30 mg by mouth 3 (three) times daily. Gummies, Disp: , Rfl:    Dextromethorphan-guaiFENesin 5-100 MG/5ML LIQD, Take by mouth., Disp: , Rfl:    diphenhydrAMINE (SOMINEX) 25 MG tablet, Take 25 mg by mouth at bedtime as needed., Disp: , Rfl:    Docusate Sodium (DSS) 250 MG CAPS, Take 250 mg by mouth daily., Disp: , Rfl:    donepezil (ARICEPT) 10 MG tablet, Take 10 mg by mouth at bedtime., Disp: , Rfl:    famotidine (PEPCID) 20 MG tablet, TAKE ONE TABLET BY MOUTH AT BEDTIME (Patient not taking: Reported on 01/19/2021), Disp: 30 tablet, Rfl: 1   ferrous sulfate 324 (65 Fe) MG TBEC, Take 1 tablet (325 mg  total) by mouth daily., Disp: 90 tablet, Rfl: 3   fluticasone (FLONASE) 50 MCG/ACT nasal spray, INSTILL ONE SPRAY NASALLY DAILY, Disp: 48 g, Rfl: 0   latanoprost (XALATAN) 0.005 % ophthalmic solution, Place 1 drop into both eyes at bedtime. , Disp: , Rfl: 2   levothyroxine (SYNTHROID) 50 MCG tablet, Take 50 mcg by mouth daily., Disp: , Rfl:    meclizine (ANTIVERT) 25 MG tablet, Take 25-50 mg by mouth 3 (three) times daily., Disp: , Rfl:    memantine (NAMENDA) 5 MG tablet, Take 5 mg by mouth 2 (two) times daily., Disp: , Rfl:    montelukast (SINGULAIR) 10 MG tablet, Take 10 mg by mouth at bedtime., Disp: , Rfl:    pantoprazole (PROTONIX) 40 MG tablet, Take 40 mg by mouth daily. ,  Disp: , Rfl: 0   pregabalin (LYRICA) 100 MG capsule, Take 100 mg by mouth 2 (two) times daily., Disp: , Rfl:    pregabalin (LYRICA) 75 MG capsule, TAKE ONE CAPSULE BY MOUTH TWICE DAILY, Disp: 180 capsule, Rfl: 1   rOPINIRole (REQUIP) 2 MG tablet, Take 2 mg by mouth at bedtime. , Disp: , Rfl: 1   senna (SENOKOT) 8.6 MG TABS tablet, Take 8.6 mg by mouth at bedtime. , Disp: , Rfl:    venlafaxine XR (EFFEXOR-XR) 150 MG 24 hr capsule, Take 150 mg by mouth every morning., Disp: , Rfl:    Vitamin D, Ergocalciferol, (DRISDOL) 1.25 MG (50000 UNIT) CAPS capsule, Take 50,000 Units by mouth every Sunday., Disp: , Rfl:    XARELTO 10 MG TABS tablet, TAKE ONE TABLET BY MOUTH EVERY DAY WITH SUPPER, Disp: 30 tablet, Rfl: 12  Observations/Objective: Patient is well-developed, well-nourished in no acute distress.  Resting comfortably  at home.  Head is normocephalic, atraumatic.  No labored breathing.  Speech is clear and coherent with logical content.  Patient is alert and oriented at baseline.  Voice hoarse No cough during visit. Slight SOB noted while talking  Assessment and Plan:  [name] in today with chief complaint of Sinusitis   1. Mild intermittent asthma with exacerbation Rest Breathing treatment as  needed Call if symptoms worsen or change Meds ordered this encounter  Medications   fluticasone (FLONASE) 50 MCG/ACT nasal spray    Sig: Place 2 sprays into both nostrils daily.    Dispense:  16 g    Refill:  6    Order Specific Question:   Supervising Provider    Answer:   Sabra Heck, BRIAN [[id number]]   predniSONE (DELTASONE) 20 MG tablet    Sig: Take 2 tablets (40 mg total) by mouth daily with breakfast for 5 days. 2 po daily for 5 days    Dispense:  10 tablet    Refill:  0    Order Specific Question:   Supervising Provider    Answer:   Noemi Chapel [[id number]]        Follow Up Instructions: I discussed the assessment and treatment plan with the patient. The patient was provided an opportunity to ask questions and all were answered. The patient agreed with the plan and demonstrated an understanding of the instructions.  A copy of instructions were sent to the patient via MyChart.  The patient was advised to call back or seek an in-person evaluation if the symptoms worsen or if the condition fails to improve as anticipated.  Time:  I spent 10 minutes with the patient via telehealth technology discussing the above problems/concerns.    Mary-Margaret Hassell Done, FNP

## 2022-02-15 ENCOUNTER — Other Ambulatory Visit: Payer: Self-pay | Admitting: Family

## 2022-02-15 ENCOUNTER — Telehealth: Payer: Self-pay

## 2022-02-15 NOTE — Telephone Encounter (Signed)
Pt called stating her xarelto is up to 530/month and she can keep paying that much. States she is requesting help or a switch in her prescriptions for something cheaper. Called patient back and informed her to contact her pharmacy and insurance to make sure nothing has changed that would make the medication increase that much recently and also provided her with the number for xarelto assistance program, she will try calling to see if she qualifies. She will call back once she hears back.

## 2022-03-08 ENCOUNTER — Telehealth: Payer: Self-pay

## 2022-03-08 NOTE — Telephone Encounter (Signed)
[name] stating that she is paying too much for her Xarelto prescription and that she has contacted [place] 3 times and not had anyone return her call. Pt stated she wanted our office to "act like they care and call her back to help her." ? ?Per notes in chart pt contacted office on 02/15/22 and spoke with Tiffany who advised her to contact the patient assistance program and her insurance company for further assistance with Xarelto cost and was also provided with contact numbers.  ? ?Called pt and informed her we had no heard back from her since  the last telephone encounter on 02/15/22 and inquired if she contacted her insurance company or was able to get patient assistance and she replied stating "that she could not remember." Offered to provide her with information again and she requested they be sent via [place]. Informed pt we would send her the contact information for Xarelto patient assistance and two other medications that Dr Marin Olp recommended but also informed her these can be costly as well and that she needed to contact her insurance to find out cost on each of them. Pt verbalized all understanding and will contact insurance and try to see if she qualifies for pt assistance. Pt declined any other questions or concerns at this time and will call our office back when she finds out more information.  ? ?[employer] including names of possible medications and assistance programs for each one.  ? ? ?

## 2022-03-23 ENCOUNTER — Inpatient Hospital Stay (HOSPITAL_BASED_OUTPATIENT_CLINIC_OR_DEPARTMENT_OTHER): Payer: Medicare Other | Admitting: Family

## 2022-03-23 ENCOUNTER — Encounter: Payer: Self-pay | Admitting: Family

## 2022-03-23 ENCOUNTER — Inpatient Hospital Stay: Payer: Medicare Other | Attending: Hematology & Oncology

## 2022-03-23 ENCOUNTER — Telehealth: Payer: Self-pay | Admitting: *Deleted

## 2022-03-23 VITALS — BP 116/74 | HR 80 | Temp 98.1°F | Resp 17 | Wt 169.1 lb

## 2022-03-23 DIAGNOSIS — R42 Dizziness and giddiness: Secondary | ICD-10-CM | POA: Diagnosis not present

## 2022-03-23 DIAGNOSIS — R202 Paresthesia of skin: Secondary | ICD-10-CM | POA: Diagnosis not present

## 2022-03-23 DIAGNOSIS — I2601 Septic pulmonary embolism with acute cor pulmonale: Secondary | ICD-10-CM

## 2022-03-23 DIAGNOSIS — Z90710 Acquired absence of both cervix and uterus: Secondary | ICD-10-CM | POA: Diagnosis not present

## 2022-03-23 DIAGNOSIS — G459 Transient cerebral ischemic attack, unspecified: Secondary | ICD-10-CM

## 2022-03-23 DIAGNOSIS — Z86711 Personal history of pulmonary embolism: Secondary | ICD-10-CM | POA: Insufficient documentation

## 2022-03-23 DIAGNOSIS — Z886 Allergy status to analgesic agent status: Secondary | ICD-10-CM | POA: Insufficient documentation

## 2022-03-23 DIAGNOSIS — Z7901 Long term (current) use of anticoagulants: Secondary | ICD-10-CM | POA: Diagnosis not present

## 2022-03-23 DIAGNOSIS — Z86718 Personal history of other venous thrombosis and embolism: Secondary | ICD-10-CM | POA: Insufficient documentation

## 2022-03-23 DIAGNOSIS — Z79899 Other long term (current) drug therapy: Secondary | ICD-10-CM | POA: Diagnosis not present

## 2022-03-23 DIAGNOSIS — R200 Anesthesia of skin: Secondary | ICD-10-CM | POA: Diagnosis not present

## 2022-03-23 DIAGNOSIS — K5909 Other constipation: Secondary | ICD-10-CM | POA: Diagnosis not present

## 2022-03-23 DIAGNOSIS — M329 Systemic lupus erythematosus, unspecified: Secondary | ICD-10-CM | POA: Diagnosis present

## 2022-03-23 DIAGNOSIS — R791 Abnormal coagulation profile: Secondary | ICD-10-CM | POA: Diagnosis not present

## 2022-03-23 DIAGNOSIS — R760 Raised antibody titer: Secondary | ICD-10-CM | POA: Diagnosis not present

## 2022-03-23 LAB — CMP (CANCER CENTER ONLY)
ALT: 14 U/L (ref 0–44)
AST: 23 U/L (ref 15–41)
Albumin: 4.5 g/dL (ref 3.5–5.0)
Alkaline Phosphatase: 104 U/L (ref 38–126)
Anion gap: 9 (ref 5–15)
BUN: 14 mg/dL (ref 8–23)
CO2: 30 mmol/L (ref 22–32)
Calcium: 10.3 mg/dL (ref 8.9–10.3)
Chloride: 103 mmol/L (ref 98–111)
Creatinine: 1.17 mg/dL — ABNORMAL HIGH (ref 0.44–1.00)
GFR, Estimated: 49 mL/min — ABNORMAL LOW (ref >60)
Glucose, Bld: 96 mg/dL (ref 70–99)
Potassium: 4.1 mmol/L (ref 3.5–5.1)
Sodium: 142 mmol/L (ref 135–145)
Total Bilirubin: 0.6 mg/dL (ref 0.3–1.2)
Total Protein: 7.3 g/dL (ref 6.5–8.1)

## 2022-03-23 LAB — CBC WITH DIFFERENTIAL (CANCER CENTER ONLY)
Abs Immature Granulocytes: 0.07 10*3/uL (ref 0.00–0.07)
Basophils Absolute: 0.1 10*3/uL (ref 0.0–0.1)
Basophils Relative: 1 %
Eosinophils Absolute: 0.2 10*3/uL (ref 0.0–0.5)
Eosinophils Relative: 1 %
HCT: 46.2 % — ABNORMAL HIGH (ref 36.0–46.0)
Hemoglobin: 14.8 g/dL (ref 12.0–15.0)
Immature Granulocytes: 1 %
Lymphocytes Relative: 54 %
Lymphs Abs: 8 10*3/uL — ABNORMAL HIGH (ref 0.7–4.0)
MCH: 29.8 pg (ref 26.0–34.0)
MCHC: 32 g/dL (ref 30.0–36.0)
MCV: 93.1 fL (ref 80.0–100.0)
Monocytes Absolute: 0.7 10*3/uL (ref 0.1–1.0)
Monocytes Relative: 5 %
Neutro Abs: 5.6 10*3/uL (ref 1.7–7.7)
Neutrophils Relative %: 38 %
Platelet Count: 286 10*3/uL (ref 150–400)
RBC: 4.96 MIL/uL (ref 3.87–5.11)
RDW: 14.3 % (ref 11.5–15.5)
Smear Review: NORMAL
WBC Count: 14.6 10*3/uL — ABNORMAL HIGH (ref 4.0–10.5)
nRBC: 0 % (ref 0.0–0.2)

## 2022-03-23 NOTE — Telephone Encounter (Signed)
Per 03/23/22 los - gave upcoming appointments - confirmed ?

## 2022-03-23 NOTE — Progress Notes (Signed)
?Hematology and Oncology Follow Up Visit ? ?[name] ?[id number] ?1946-03-26 76 y.o. ?03/23/2022 ? ? ?Principle Diagnosis:  ?Bilateral pulmonary emboli-02/21/2018 ?Thromboembolic disease in right gastrocnemius vein ?(+) Lupus Anti-coagulant --transiently positive ?  ?Current Therapy:        ?Xarelto 10 mg p.o. q day -- maintanence on 09/06/2018 ?EC ASA 81 mg po q day ?  ?Interim History:  [name] is here today for follow-up. She is doing fairly well. She states that she struggles with persistent vertigo which effects her balance. She states that she has not fallen for a few months. No syncope.  ?No fever, chills, n/v, cough, rash, SOB, chest pain, palpitations, abdominal pain or changes in bowel or bladder habits at this time.  ?She has chronic constipation since her hysterectomy in her 20's and takes a stool softener daily.  ?She has has occasional SOB with over exertion.  ?No swelling or tenderness in her extremities.  ?She has intermittent numbness and tingling in her fingertips and feet that is unchanged from baseline.  ?She has maintained a good appetite and is doing her best to stay well hydrated. Her weight is stable at 169 lbs.  ? ?ECOG Performance Status: 1 - Symptomatic but completely ambulatory ? ?Medications:  ?Allergies as of 03/23/2022   ? ?   Reactions  ? Naproxen Shortness Of Breath, Itching, Swelling  ? Tizanidine Shortness Of Breath  ? dizziness  ? Naprosyn [naproxen]   ? Unknown reaction  ? ?  ? ?  ?Medication List  ?  ? ?  ? Accurate as of March 23, 2022  9:56 AM. If you have any questions, ask your nurse or doctor.  ?  ?  ? ?  ? ?acetaminophen 650 MG CR tablet ?Commonly known as: TYLENOL ?Take 650 mg by mouth every 8 (eight) hours as needed for pain. ?  ?Advair Diskus 250-50 MCG/ACT Aepb ?Generic drug: fluticasone-salmeterol ?Inhale one puff into the lungs 2 (two) times daily. ?  ?albuterol 108 (90 Base) MCG/ACT inhaler ?Commonly known as: ProAir HFA ?Inhale 2 puffs into the lungs every  4 (four) hours as needed for wheezing or shortness of breath. ?  ?albuterol (2.5 MG/3ML) 0.083% nebulizer solution ?Commonly known as: PROVENTIL ?Take 3 mLs (2.5 mg total) by nebulization every 6 (six) hours as needed for wheezing or shortness of breath. ?  ?amLODipine 10 MG tablet ?Commonly known as: NORVASC ?Take 10 mg by mouth daily. ?  ?aspirin EC 81 MG tablet ?Take 81 mg by mouth daily. Swallow whole. ?  ?benzonatate 100 MG capsule ?Commonly known as: TESSALON ?Take 100 mg by mouth 3 (three) times daily as needed. ?  ?budesonide 0.25 MG/2ML nebulizer solution ?Commonly known as: Pulmicort ?Take 2 mLs (0.25 mg total) by nebulization 2 (two) times daily. Dx: I50.38 ?  ?buPROPion 300 MG 24 hr tablet ?Commonly known as: WELLBUTRIN XL ?Take 300 mg by mouth every morning. ?  ?co-enzyme Q-10 30 MG capsule ?Take 30 mg by mouth 3 (three) times daily. Gummies ?  ?Dextromethorphan-guaiFENesin 5-100 MG/5ML Liqd ?Take by mouth. ?  ?diphenhydrAMINE 25 MG tablet ?Commonly known as: [place] ?Take 25 mg by mouth at bedtime as needed. ?  ?donepezil 10 MG tablet ?Commonly known as: ARICEPT ?Take 10 mg by mouth at bedtime. ?  ?DSS 250 MG Caps ?Take 250 mg by mouth daily. ?  ?famotidine 20 MG tablet ?Commonly known as: PEPCID ?TAKE ONE TABLET BY MOUTH AT BEDTIME ?  ?ferrous sulfate 324 (65 Fe) MG Tbec ?Take  1 tablet (325 mg total) by mouth daily. ?  ?fluticasone 50 MCG/ACT nasal spray ?Commonly known as: FLONASE ?Place 2 sprays into both nostrils daily. ?  ?latanoprost 0.005 % ophthalmic solution ?Commonly known as: XALATAN ?Place 1 drop into both eyes at bedtime. ?  ?levothyroxine 50 MCG tablet ?Commonly known as: SYNTHROID ?Take 50 mcg by mouth daily. ?  ?meclizine 25 MG tablet ?Commonly known as: ANTIVERT ?Take 25-50 mg by mouth 3 (three) times daily. ?  ?memantine 5 MG tablet ?Commonly known as: NAMENDA ?Take 5 mg by mouth 2 (two) times daily. ?  ?montelukast 10 MG tablet ?Commonly known as: SINGULAIR ?Take 10 mg by mouth at  bedtime. ?  ?pantoprazole 40 MG tablet ?Commonly known as: PROTONIX ?Take 40 mg by mouth daily. ?  ?pregabalin 75 MG capsule ?Commonly known as: LYRICA ?TAKE ONE CAPSULE BY MOUTH TWICE DAILY ?  ?pregabalin 100 MG capsule ?Commonly known as: LYRICA ?Take 100 mg by mouth 2 (two) times daily. ?  ?rOPINIRole 2 MG tablet ?Commonly known as: REQUIP ?Take 2 mg by mouth at bedtime. ?  ?senna 8.6 MG Tabs tablet ?Commonly known as: SENOKOT ?Take 8.6 mg by mouth at bedtime. ?  ?venlafaxine XR 150 MG 24 hr capsule ?Commonly known as: EFFEXOR-XR ?Take 150 mg by mouth every morning. ?  ?Vitamin D (Ergocalciferol) 1.25 MG (50000 UNIT) Caps capsule ?Commonly known as: DRISDOL ?Take 50,000 Units by mouth every Sunday. ?  ?Xarelto 10 MG Tabs tablet ?Generic drug: rivaroxaban ?TAKE ONE TABLET BY MOUTH EVERY DAY WITH SUPPER ?  ? ?  ? ? ?Allergies:  ?Allergies  ?Allergen Reactions  ? Naproxen Shortness Of Breath, Itching and Swelling  ? Tizanidine Shortness Of Breath  ?  dizziness  ? Naprosyn [Naproxen]   ?  Unknown reaction  ? ? ?Past Medical History, Surgical history, Social history, and Family History were reviewed and updated. ? ?Review of Systems: ?All other 10 point review of systems is negative.  ? ?Physical Exam: ? vitals were not taken for this visit.  ? ?Wt Readings from Last 3 Encounters:  ?09/22/21 174 lb (78.9 kg)  ?05/19/21 172 lb (78 kg)  ?01/19/21 164 lb (74.4 kg)  ? ? ?Ocular: Sclerae unicteric, pupils equal, round and reactive to light ?Ear-nose-throat: Oropharynx clear, dentition fair ?Lymphatic: No cervical or supraclavicular adenopathy ?Lungs no rales or rhonchi, good excursion bilaterally ?Heart regular rate and rhythm, no murmur appreciated ?Abd soft, nontender, positive bowel sounds ?MSK no focal spinal tenderness, no joint edema ?Neuro: non-focal, well-oriented, appropriate affect ?Breasts: Deferred  ? ?Lab Results  ?Component Value Date  ? WBC 14.6 (H) 03/23/2022  ? HGB 14.8 03/23/2022  ? HCT 46.2 (H)  03/23/2022  ? MCV 93.1 03/23/2022  ? PLT 286 03/23/2022  ? ?Lab Results  ?Component Value Date  ? FERRITIN 124 09/22/2021  ? IRON 60 09/22/2021  ? TIBC 249 09/22/2021  ? UIBC 188 09/22/2021  ? IRONPCTSAT 24 09/22/2021  ? ?Lab Results  ?Component Value Date  ? RBC 4.96 03/23/2022  ? ?No results found for: KPAFRELGTCHN, LAMBDASER, KAPLAMBRATIO ?No results found for: IGGSERUM, IGA, IGMSERUM ?No results found for: TOTALPROTELP, ALBUMINELP, A1GS, A2GS, BETS, BETA2SER, GAMS, MSPIKE, SPEI ?  Chemistry   ?   ?Component Value Date/Time  ? NA 143 09/22/2021 0800  ? NA 141 11/03/2015 1117  ? K 4.0 09/22/2021 0800  ? CL 106 09/22/2021 0800  ? CO2 30 09/22/2021 0800  ? BUN 16 09/22/2021 0800  ? BUN 22 11/03/2015 1117  ?  CREATININE 1.04 (H) 09/22/2021 0800  ?    ?Component Value Date/Time  ? CALCIUM 9.5 09/22/2021 0800  ? ALKPHOS 97 09/22/2021 0800  ? AST 25 09/22/2021 0800  ? ALT 12 09/22/2021 0800  ? BILITOT 0.4 09/22/2021 0800  ?  ? ? ? ?Impression and Plan: [name] is a very pleasant 76 yo caucasian female with history of pulmonary emboli and lower extremity DVT. She had a transiently positive lupus anticoagulant.  ?She continues to do well. No new recurrence.   ?She will stay on her same regimen with Xarelto and 1 baby aspirin daily.  ?Follow-up in 6 months.  ? ?Lottie Dawson, NP ?4/5/20239:56 AM ? ?

## 2022-03-24 LAB — LUPUS ANTICOAGULANT PANEL
DRVVT: 34.6 s (ref 0.0–47.0)
PTT Lupus Anticoagulant: 25.9 s (ref 0.0–43.5)

## 2022-03-25 LAB — CARDIOLIPIN ANTIBODIES, IGG, IGM, IGA
Anticardiolipin IgA: <9 APL U/mL (ref 0–11)
Anticardiolipin IgG: <9 GPL U/mL (ref 0–14)
Anticardiolipin IgM: 9 MPL U/mL (ref 0–12)

## 2022-03-25 LAB — BETA-2-GLYCOPROTEIN I ABS, IGG/M/A
Beta-2 Glyco I IgG: <9 GPI IgG units (ref 0–20)
Beta-2-Glycoprotein I IgA: <9 GPI IgA units (ref 0–25)
Beta-2-Glycoprotein I IgM: 49 GPI IgM units — ABNORMAL HIGH (ref 0–32)

## 2022-04-12 ENCOUNTER — Other Ambulatory Visit: Payer: Self-pay | Admitting: Family

## 2022-07-26 ENCOUNTER — Encounter: Payer: Self-pay | Admitting: *Deleted

## 2022-07-27 ENCOUNTER — Ambulatory Visit (INDEPENDENT_AMBULATORY_CARE_PROVIDER_SITE_OTHER): Payer: Medicare Other | Admitting: Neurology

## 2022-07-27 ENCOUNTER — Encounter: Payer: Self-pay | Admitting: Neurology

## 2022-07-27 VITALS — BP 157/92 | HR 72 | Ht 61.0 in | Wt 156.0 lb

## 2022-07-27 DIAGNOSIS — R413 Other amnesia: Secondary | ICD-10-CM

## 2022-07-27 DIAGNOSIS — R6889 Other general symptoms and signs: Secondary | ICD-10-CM | POA: Diagnosis not present

## 2022-07-27 DIAGNOSIS — F515 Nightmare disorder: Secondary | ICD-10-CM

## 2022-07-27 DIAGNOSIS — R0683 Snoring: Secondary | ICD-10-CM

## 2022-07-27 DIAGNOSIS — R519 Headache, unspecified: Secondary | ICD-10-CM

## 2022-07-27 DIAGNOSIS — G2581 Restless legs syndrome: Secondary | ICD-10-CM

## 2022-07-27 DIAGNOSIS — R351 Nocturia: Secondary | ICD-10-CM

## 2022-07-27 NOTE — Patient Instructions (Signed)

## 2022-07-27 NOTE — Progress Notes (Signed)
Subjective:    Patient ID: [name] is a 76 y.o. female.  HPI    Star Age, MD, PhD [facility] [address], [address] [place], [place] [address]  Dear Dr. Lynnette Caffey,   I saw your patient, [name], upon your kind request in my sleep clinic today for initial consultation of her sleep disorder, including daytime somnolence, and vivid dreams.  The patient is accompanied by her daughter today.  As you know, [name] is a 76 year old right-handed woman with an underlying medical history of arthritis, allergic rhinitis, DVT, reflux disease, hypertension, hypothyroidism, asthma, history of bilateral pulmonary embolism, anxiety, depression, and overweight state, who reports a longstanding history of difficulty with her sleep particularly nightmares and night terrors as a child.  She has had difficulty initiating and maintaining sleep.  She snores some but not loudly.  She has had occasional morning headaches and has nocturia about once or twice per average night.  She is followed by psychiatry.  She has been on antidepressant medications for years, currently on Effexor for the past 2 years.  She has ongoing vivid dreams, also nightmares and sometimes it is hard for her to distinguish between reality and dream.  She has been on donepezil which recently was changed to daytime rather than nighttime.  Her psychiatrist suspects that she has some degree of PTSD.  She has no history of smoking, has switched to decaf coffee only, she does not drink any alcohol currently.  She lives alone, she has a daughter who lives next door, grandson lives across the street, and her son lives about 15 minutes away.  Bedtime is currently between 10:30 PM, rise time between 6:30 AM and 7:30 AM.  She has been taking 2.5 mg of melatonin at night which has helped a little bit.  Of note, she takes Lyrica 100 mg twice daily.  She has had restless leg symptoms and has been on  ropinirole, currently 2 mg at bedtime with reasonably good results.  I reviewed your office note from 05/18/2022. Her Epworth sleepiness score 6 out of 24, fatigue severity score is 56 out of 63.   Her Past Medical History Is Significant For: Past Medical History:  Diagnosis Date   Allergic rhinitis    Allergic rhinitis    Anxiety    Arthritis    "hands, feet, shoulders, arms" (02/22/2018)   Bilateral pulmonary embolism ([place]) 02/21/2018   Chronic cough    Depression    Disease of thyroid gland    Dupuytren contracture    "both feet, both hands" (02/22/2018)   DVT (deep venous thrombosis) ([facility])    bil le   GERD (gastroesophageal reflux disease)    Glaucoma    Hypertension    Hypothyroidism    Mild persistent asthma    cougher not a wheezr gets chokes easily   Pneumonia    PONV (postoperative nausea and vomiting)     Her Past Surgical History Is Significant For: Past Surgical History:  Procedure Laterality Date   BLADDER SUSPENSION  X 2   BUNIONECTOMY     RIGHT FOOT ARCH REPAIRED AS WELL   CATARACT EXTRACTION W/ INTRAOCULAR LENS  IMPLANT, BILATERAL Bilateral    DILATION AND CURETTAGE OF UTERUS  X 3   DUPUYTREN CONTRACTURE RELEASE Bilateral    "feet"   FASCIECTOMY Right 01/23/2018   Procedure: SEGMENTAL FASCIECTOMY RIGHT MIDDLE FINGER;  Surgeon: Daryll Brod, MD;  Location: [facility];  Service: Orthopedics;  Laterality: Right;  AXILLARY BLOCK   fracture surgery right tibia and ankle     KNEE ARTHROSCOPY Right    REDUCTION MAMMAPLASTY Bilateral 2018   TOTAL ABDOMINAL HYSTERECTOMY  1975   "got pregnant w/IUD in then lost baby"   TOTAL KNEE ARTHROPLASTY Right 06/08/2020   Procedure: TOTAL KNEE ARTHROPLASTY;  Surgeon: Vickey Huger, MD;  Location: [facility];  Service: Orthopedics;  Laterality: Right;    Her Family History Is Significant For: Family History  Problem Relation Age of Onset   Hypertension Mother    Arthritis Mother    Breast cancer Mother    Colon  cancer Mother    Colon polyps Mother    Diabetes Father    Pneumonia Father    Heart attack Father    Heart disease Father    Dementia Father    Diabetes Brother    Breast cancer Maternal Aunt    Sleep apnea Neg Hx     Her Social History Is Significant For: Social History   Socioeconomic History   Marital status: Widowed    Spouse name: Not on file   Number of children: 2   Years of education: 22   Highest education level: Not on file  Occupational History   Occupation: Retired  Tobacco Use   Smoking status: Never   Smokeless tobacco: Never  Vaping Use   Vaping Use: Never used  Substance and Sexual Activity   Alcohol use: Not Currently    Comment: 02/22/2018 "might have a drink q couple years"   Drug use: No   Sexual activity: Not Currently  Other Topics Concern   Not on file  Social History Narrative   ** Merged History Encounter **       Lives at home with her brother. Right-handed. 3-4 cups coffee per day.   Social Determinants of Health   Financial Resource Strain: Not on file  Food Insecurity: Not on file  Transportation Needs: Not on file  Physical Activity: Not on file  Stress: Not on file  Social Connections: Not on file    Her Allergies Are:  Allergies  Allergen Reactions   Naproxen Shortness Of Breath, Itching and Swelling   Tizanidine Shortness Of Breath    dizziness   Naprosyn [Naproxen]     Unknown reaction  :   Her Current Medications Are:  Outpatient Encounter Medications as of 07/27/2022  Medication Sig   ADVAIR DISKUS 250-50 MCG/DOSE AEPB Inhale one puff into the lungs 2 (two) times daily.   albuterol (PROAIR HFA) 108 (90 BASE) MCG/ACT inhaler Inhale 2 puffs into the lungs every 4 (four) hours as needed for wheezing or shortness of breath.   albuterol (PROVENTIL) (2.5 MG/3ML) 0.083% nebulizer solution Take 3 mLs (2.5 mg total) by nebulization every 6 (six) hours as needed for wheezing or shortness of breath.   amLODipine (NORVASC) 10 MG  tablet Take 10 mg by mouth daily.    budesonide (PULMICORT) 0.25 MG/2ML nebulizer solution Take 2 mLs (0.25 mg total) by nebulization 2 (two) times daily. Dx: J45.20 (Patient taking differently: Take 0.25 mg by nebulization as needed. Dx: J45.20)   cetirizine (ZYRTEC) 10 MG tablet Take 10 mg by mouth daily.   Docusate Sodium (DSS) 250 MG CAPS Take 250 mg by mouth daily.   donepezil (ARICEPT) 10 MG tablet Take 10 mg by mouth at bedtime.   famotidine (PEPCID) 20 MG tablet TAKE ONE TABLET BY MOUTH AT BEDTIME (Patient taking differently: Take 20 mg by mouth at  bedtime. As needed)   ferrous sulfate 324 (65 Fe) MG TBEC Take 1 tablet (325 mg total) by mouth daily.   fluticasone (FLONASE) 50 MCG/ACT nasal spray Place 2 sprays into both nostrils daily.   latanoprost (XALATAN) 0.005 % ophthalmic solution Place 1 drop into both eyes at bedtime.    levothyroxine (SYNTHROID) 50 MCG tablet Take 50 mcg by mouth daily.   meclizine (ANTIVERT) 25 MG tablet Take 25-50 mg by mouth 3 (three) times daily.   memantine (NAMENDA) 5 MG tablet Take 5 mg by mouth 2 (two) times daily.   montelukast (SINGULAIR) 10 MG tablet Take 10 mg by mouth at bedtime.   pantoprazole (PROTONIX) 40 MG tablet Take 40 mg by mouth daily.    pregabalin (LYRICA) 100 MG capsule Take 100 mg by mouth 2 (two) times daily.   rOPINIRole (REQUIP) 2 MG tablet Take 2 mg by mouth at bedtime.    senna (SENOKOT) 8.6 MG TABS tablet Take 8.6 mg by mouth at bedtime.    venlafaxine XR (EFFEXOR-XR) 150 MG 24 hr capsule Take 150 mg by mouth every morning.   Vitamin D, Ergocalciferol, (DRISDOL) 1.25 MG (50000 UNIT) CAPS capsule Take 50,000 Units by mouth every Sunday.   XARELTO 10 MG TABS tablet TAKE ONE TABLET BY MOUTH EVERY DAY WITH SUPPER   acetaminophen (TYLENOL) 650 MG CR tablet Take 650 mg by mouth every 8 (eight) hours as needed for pain.   aspirin EC 81 MG tablet Take 81 mg by mouth daily. Swallow whole.   buPROPion (WELLBUTRIN XL) 300 MG 24 hr tablet  Take 300 mg by mouth every morning.   co-enzyme Q-10 30 MG capsule Take 30 mg by mouth 3 (three) times daily. Gummies   diphenhydrAMINE (SOMINEX) 25 MG tablet Take 25 mg by mouth at bedtime as needed.   pregabalin (LYRICA) 75 MG capsule TAKE ONE CAPSULE BY MOUTH TWICE DAILY   [DISCONTINUED] Dextromethorphan-guaiFENesin 5-100 MG/5ML LIQD Take by mouth. As needed   No facility-administered encounter medications on file as of 07/27/2022.  :   Review of Systems:  Out of a complete 14 point review of systems, all are reviewed and negative with the exception of these symptoms as listed below:  Review of Systems  Neurological:        Pt here for sleep consult   Pt states snores,hypertension,fatigue,headaches  Py denies sleep study,CPAP machine Pt states she Asthma and night terrors .  ESS:6 FSS:44          Objective:  Neurological Exam  Physical Exam Physical Examination:   Vitals:   07/27/22 1425  BP: (!) 157/92  Pulse: 72    General Examination: The patient is a very pleasant 76 y.o. female in no acute distress. She appears well-developed and well-nourished and well groomed.   HEENT: Normocephalic, atraumatic, pupils are equal, round and reactive to light, extraocular tracking is good without limitation to gaze excursion or nystagmus noted. Hearing is grossly intact. Face is symmetric with normal facial animation. Speech is clear with no dysarthria noted. There is no hypophonia. There is no lip, neck/head, jaw or voice tremor. Neck is supple with full range of passive and active motion. There are no carotid bruits on auscultation. Oropharynx exam reveals: mild mouth dryness, adequate dental hygiene and mild airway crowding, due to small airway entry, tonsils on the smaller side, neck circumference 13 5/8 inches.  Mallampati class III.  Mild overbite.  Chest: Clear to auscultation without wheezing, rhonchi or crackles noted.  Heart:  S1+S2+0, regular and normal without murmurs,  rubs or gallops noted.   Abdomen: Soft, non-tender and non-distended.  Extremities: There is no pitting edema in the distal lower extremities bilaterally.   Skin: Warm and dry without trophic changes noted.   Musculoskeletal: exam reveals prominent arthritic changes in both hands including Dupuytren's contractures left more than right with status post surgeries, status post right knee replacement, status post left knee arthroscopic surgery.    Neurologically:  Mental status: The patient is awake, alert and oriented in all 4 spheres. Her immediate and remote memory, attention, language skills and fund of knowledge are appropriate. There is no evidence of aphasia, agnosia, apraxia or anomia. Speech is clear with normal prosody and enunciation. Thought process is linear. Mood is normal and affect is normal.  Cranial nerves II - XII are as described above under HEENT exam.  Motor exam: Normal bulk, strength and tone is noted. There is no obvious trauma. Fine motor skills and coordination: grossly intact.  Cerebellar testing: No dysmetria or intention tremor. There is no truncal or gait ataxia.  Sensory exam: intact to light touch in the upper and lower extremities.  Gait, station and balance: She stands without difficulty and walks with a multipronged cane on the right side.  No limp.  Assessment and Plan:  In summary, TARISSA KERIN is a very pleasant 76 y.o.-year old female with an underlying medical history of arthritis, allergic rhinitis, DVT, reflux disease, hypertension, hypothyroidism, asthma, history of bilateral pulmonary embolism, anxiety, depression, and overweight state, who presents for evaluation of her sleep disturbance including difficulty initiating and maintaining sleep, nightmares, daytime somnolence, snoring and restless leg symptoms.  Her sleep disturbance may be multifactorial, underlying sleep disordered breathing is not excluded at this time and may be a component, she is  currently on symptomatic treatment for restless legs with ropinirole with good success and has had some success with melatonin at night.  She has also seen some benefit of switching the donepezil to daytime rather than nighttime as far as her vivid dreams or concerned. A laboratory attended sleep study is considered gold standard for evaluation of sleep disordered breathing and any underlying organic sleep disorders and is recommended at this time and clinically justified.   I had a long chat with the patient and her daughter about my findings and the diagnosis of sleep apnea, particularly OSA, its prognosis and treatment options. We talked about medical/conservative treatments, surgical interventions and non-pharmacological approaches for symptom control. I explained, in particular, the risks and ramifications of untreated moderate to severe OSA, especially with respect to developing cardiovascular disease down the road, including congestive heart failure (CHF), difficult to treat hypertension, cardiac arrhythmias (particularly A-fib), neurovascular complications including TIA, stroke and dementia. Even type 2 diabetes has, in part, been linked to untreated OSA. Symptoms of untreated OSA may include (but may not be limited to) daytime sleepiness, nocturia (i.e. frequent nighttime urination), memory problems, mood irritability and suboptimally controlled or worsening mood disorder such as depression and/or anxiety, lack of energy, lack of motivation, physical discomfort, as well as recurrent headaches, especially morning or nocturnal headaches. We talked about the importance of maintaining a healthy lifestyle and striving for healthy weight.  In addition, we talked about the importance of striving for and maintaining good sleep hygiene.  She can bring her melatonin for her sleep study.  She can also take her ropinirole for her sleep study. I recommended the following at this time: sleep study.  I outlined  the  differences between a laboratory attended sleep study which is considered more comprehensive and accurate over the option of a home sleep test (HST); the latter may lead to underestimation of sleep disordered breathing in some instances and does not help with diagnosing upper airway resistance syndrome and is not accurate enough to diagnose primary central sleep apnea typically. I explained the different sleep test procedures to the patient in detail and also outlined possible surgical and non-surgical treatment options of OSA, including the use of a pressure airway pressure (PAP) device (ie CPAP, AutoPAP/APAP or BiPAP in certain circumstances), a custom-made dental device (aka oral appliance, which would require a referral to a specialist dentist or orthodontist typically, and is generally speaking not considered a good choice for patients with full dentures or edentulous state), upper airway surgical options, such as traditional UPPP (which is not considered a first-line treatment) or the Inspire device (hypoglossal nerve stimulator, which would involve a referral for consultation with an ENT surgeon, after careful selection, following inclusion criteria). I explained the PAP treatment option to the patient in detail, as this is generally considered first-line treatment.  The patient indicated that she would be willing to try PAP therapy, if the need arises. I explained the importance of being compliant with PAP treatment, not only for insurance purposes but primarily to improve patient's symptoms symptoms, and for the patient's long term health benefit, including to reduce Her cardiovascular risks longer-term.    We will pick up our discussion about the next steps and treatment options after testing.  We will keep them posted as to the test results by phone call and/or MyChart messaging where possible.  We will plan to follow-up in sleep clinic accordingly as well.  I answered all their questions today and the  patient and her daughter were in agreement.   I encouraged them to call with any interim questions, concerns, problems or updates or email [place] through [place].  Generally speaking, sleep test authorizations may take up to 2 weeks, sometimes less, sometimes longer, the patient is encouraged to get in touch with [place] if they do not hear back from the sleep lab staff directly within the next 2 weeks.  Thank you very much for allowing me to participate in the care of this nice patient. If I can be of any further assistance to you please do not hesitate to call me at [phone].  Sincerely,   Star Age, MD, PhD

## 2022-08-01 ENCOUNTER — Telehealth: Payer: Self-pay | Admitting: Neurology

## 2022-08-01 NOTE — Telephone Encounter (Signed)
NPSG- Medicare/supp no auth req.   Patient is scheduled at [facility] for 08/23/22 at 8 pm.  Carlye Grippe patient daughter on dpr the appt. Information.

## 2022-08-23 ENCOUNTER — Ambulatory Visit (INDEPENDENT_AMBULATORY_CARE_PROVIDER_SITE_OTHER): Payer: Medicare Other | Admitting: Neurology

## 2022-08-23 DIAGNOSIS — G4733 Obstructive sleep apnea (adult) (pediatric): Secondary | ICD-10-CM

## 2022-08-23 DIAGNOSIS — R519 Headache, unspecified: Secondary | ICD-10-CM

## 2022-08-23 DIAGNOSIS — F515 Nightmare disorder: Secondary | ICD-10-CM

## 2022-08-23 DIAGNOSIS — G4720 Circadian rhythm sleep disorder, unspecified type: Secondary | ICD-10-CM

## 2022-08-23 DIAGNOSIS — G2581 Restless legs syndrome: Secondary | ICD-10-CM

## 2022-08-23 DIAGNOSIS — R0683 Snoring: Secondary | ICD-10-CM

## 2022-08-23 DIAGNOSIS — R413 Other amnesia: Secondary | ICD-10-CM

## 2022-08-23 DIAGNOSIS — R6889 Other general symptoms and signs: Secondary | ICD-10-CM

## 2022-08-23 DIAGNOSIS — G4761 Periodic limb movement disorder: Secondary | ICD-10-CM

## 2022-08-23 DIAGNOSIS — R351 Nocturia: Secondary | ICD-10-CM

## 2022-08-30 ENCOUNTER — Telehealth: Payer: Self-pay

## 2022-08-30 NOTE — Telephone Encounter (Signed)
I called patient.  I spoke with patient's daughter, [name], per [place].  I advised patient's daughter that the sleep study showed mild sleep apnea.  Patient had trouble going to sleep and staying asleep.  She had leg twitching during her sleep which is in keeping with the diagnosis of RLS.  Dr. Rexene Alberts recommends treatment for her mild sleep apnea in the form of an AutoPap.  I advised her that the AutoPap order will be sent to a [facility], and they will be calling her within the next 1 to 2 weeks to discuss insurance coverage.  [facility] will also show the patient how to use this machine, fit her for mask, and make sure she is comfortable using machine at home.  I advised patient's daughter that patient should use the AutoPap each night for at least 4 hours.  A follow-up appointment was scheduled for January 10 at 57 with [name], NP.  Patient's daughter verbalized understanding of results.  Patient's daughter had no further questions or concerns at this time.

## 2022-08-30 NOTE — Procedures (Signed)
[facility] POLYSOMNOGRAPHY  INTERPRETATION REPORT   STUDY DATE:  08/23/2022     PATIENT NAME:  [name]         DATE OF BIRTH:  02/29/1946  PATIENT ID:  [id number]    TYPE OF STUDY:  PSG  READING PHYSICIAN: Star Age, MD, PhD REFERRING PROVIDER: Cammy Brochure, MD SCORING TECHNICIAN: Forde Radon, RPSGT   HISTORY: 76 year old right-handed woman with an underlying medical history of arthritis, allergic rhinitis, DVT, reflux disease, hypertension, hypothyroidism, asthma, history of bilateral pulmonary embolism, anxiety, depression, RLS, and overweight state, who reports a longstanding history of difficulty with her sleep particularly nightmares and night terrors as a child. ?She has had difficulty initiating and maintaining sleep. ?She snores some but not loudly. ?She has had occasional morning headaches and has nocturia about once or twice per average night.  Height: 61 in Weight: 156 lb (BMI 29) Neck Size: 14 in  MEDICATIONS: Tylenol, Proair HFA, Proventil, Norvasc, Aspirin, Pulmicort, Wellbutrin XL, Zyrtec, Co Q 10, Sominex, DSS, Aricept, Drisdol, Pepcid, 65 Fe, Flonase, Advair, Xalatan, Synthroid, Antivert, Namenda, SIngulair, Protonix, Lyrica, Xarelto, Requip, Senokot, Effexor XR, Melatonin TECHNICAL DESCRIPTION: A registered sleep technologist ( RPSGT)  was in attendance for the duration of the recording.  Data collection, scoring, video monitoring, and reporting were performed in compliance with the AASM Manual for the Scoring of Sleep and Associated Events; (Hypopnea is scored based on the criteria listed in Section VIII D. 1b in the AASM Manual V2.6 using a 4% oxygen desaturation rule or Hypopnea is scored based on the criteria listed in Section VIII D. 1a in the AASM Manual V2.6 using 3% oxygen desaturation and /or arousal rule).   SLEEP CONTINUITY AND SLEEP ARCHITECTURE: The patient took melatonin prior to lights out.  Lights-out was at 20:19: and  lights-on at  04:49, with a total recording time (TRT) of 8 h, 30.5 minutes. Total sleep time ( TST) was 221.5 minutes with a decreased sleep efficiency at 43.4%.  BODY POSITION:  TST was divided  between the following sleep positions: 87.6% supine;  12.4% lateral;  0% prone. Duration of total sleep and percent of total sleep in their respective position is as follows: supine 194 minutes (88%), non-supine 28 minutes (12%); right 02 minutes (1%), left 25 minutes (12%), and prone 00 minutes (0%).  Total supine REM sleep time was 71 minutes (100% of total REM sleep). Sleep latency was delayed at 70.0 minutes.  REM sleep latency was markedly delayed at 305.0 minutes. Of the total sleep time, the percentage of stage N1 sleep was 13.1%, which is increased, stage N2 sleep was 55%, which is normal, stage N3 sleep was absent, and REM sleep was 32.1%, which is increased. There were 7 Stage R periods observed on this study night, 39 awakenings (i.e. transitions to Stage W from any sleep stage), and 96 total stage transitions. Wake after sleep onset (WASO) time accounted for 218.5 minutes with moderate to severe sleep fragmentation noted.  RESPIRATORY MONITORING:  Based on CMS criteria (using a 4% oxygen desaturation rule for scoring hypopneas), there were 0 apneas (0 obstructive; 0 central; 0 mixed), and 39 hypopneas.  Apnea index was 0.0. Hypopnea index was 10.6. The apnea-hypopnea index was 10.6/hour overall, (8.7 supine, 0 non-supine; 6.8 REM, 6.8 supine REM). There were 0 respiratory effort-related arousals (RERAs).  The RERA index was 0 events/h. Total respiratory disturbance index (RDI) was 10.6 events/h. RDI results showed: supine RDI  8.7 /h; non-supine RDI 24.0 /  h; REM RDI 6.8 /h, supine REM RDI 6.8 /h.   Based on AASM criteria (using a 3% oxygen desaturation and /or arousal rule for scoring hypopneas), there were 0 apneas (0 obstructive; 0 central; 0 mixed), and 39 hypopneas. Apnea index was 0.0. Hypopnea  index was 10.6. The apnea-hypopnea index was 10.6 overall (8.7 supine, 0 non-supine; 6.8 REM, 6.8 supine REM).  There were 0 respiratory effort-related arousals (RERAs).  The RERA index was 0 events/h. Total respiratory disturbance index (RDI) was 10.6 events/h. RDI results showed: supine RDI  8.7 /h; non-supine RDI 24.0 /h; REM RDI 6.8 /h, supine REM RDI 6.8 /h.  OXIMETRY: Oxyhemoglobin Saturation Nadir during sleep was at  88%) from a mean of 93%.  Of the Total sleep time (TST)   hypoxemia (=<88%) was present for  0.2 minutes, or 0.1% of total sleep time.  LIMB MOVEMENTS: There were 703 periodic limb movements of sleep (190.4/hr), of which 30 (8.1/hr) were associated with an arousal. AROUSAL: There were 87 arousals in total, for an arousal index of 24 arousals/hour.  Of these, 10 were identified as respiratory-related arousals (3 /h), 30 were PLM-related arousals (8 /h), and 82 were non-specific arousals (22 /h). Snoring was classified as not audible. Post study, the patient indicated, that sleep was somewhat better than usual. EEG:  The EEG was of normal amplitude and frequency, with symmetric manifestation of sleep stages. EKG: The electrocardiogram showed normal sinus rhythm (NSR).  The average heart rate during sleep was 80 bpm.  The heart rate during sleep varied between a minimum of Tachycardia and  a maximum of  96 bpm. The patient took 4 bathroom breaks. She had some talking out of wakefulness, after waking from REM sleep, epoch 939. No other abnormal behaviors, or vocalizations were noted by the acquisition technologist.   IMPRESSION: Mild obstructive sleep apnea (OSA)  PLMD (periodic limb movements of sleep)  Dysfunctions associated with sleep stages or arousal from sleep RECOMMENDATIONS: 1. This study demonstrates overall mild obstructive sleep apnea, with a total AHI of 10.6/hour, REM AHI of 6.8/hour, supine AHI of 8.7/hour and O2 nadir of 88%. Given the patient's medical history and  sleep related complaints, treatment with positive airway pressure is recommended; this can be achieved in the form of autoPAP. Alternatively, a full-night CPAP titration study would allow optimization of therapy if needed. Other treatment options may include avoidance of supine sleep position along with weight loss, or the use of an oral appliance in selected patients. Please note, that untreated obstructive sleep apnea may carry additional perioperative morbidity. Patients with significant obstructive sleep apnea should receive perioperative PAP therapy and the surgeons and particularly the anesthesiologist should be informed of the diagnosis and the severity of the sleep disordered breathing. 2. Severe PLMs (periodic limb movements of sleep) were noted during this study with mild arousals; clinical correlation is recommended. The patient has a history of restless legs syndrome and takes ropinirole. Medication effect from her antidepressant medication should be considered.  3. This study shows significant sleep fragmentation and abnormal sleep stage percentages; these are nonspecific findings and per se do not signify an intrinsic sleep disorder or a cause for the patient's sleep-related symptoms. Causes include (but are not limited to) the first night effect of the sleep study, circadian rhythm disturbances, medication effect or an underlying mood disorder or medical problem.  4. The patient should be cautioned not to drive, work at heights, or operate dangerous or heavy equipment when tired or sleepy. Review  and reiteration of good sleep hygiene measures should be pursued with any patient. 5. The patient will be seen in follow-up by Dr. Rexene Alberts at [facility] for discussion of the test results and further management strategies. The referring provider will be notified of the test results.  I certify that I have reviewed the entire raw data recording prior to the issuance of this report in accordance with the Standards  of Accreditation of the American Academy of Sleep Medicine (AASM).  Star Age,  MD, PhD

## 2022-08-30 NOTE — Addendum Note (Signed)
Addended by: Star Age on: 08/30/2022 11:18 AM   Modules accepted: Orders

## 2022-08-30 NOTE — Telephone Encounter (Signed)
-----   Message from Star Age, MD sent at 08/30/2022 11:18 AM EDT ----- Patient referred by Dr. Lynnette Caffey, her neurologist, seen by me on 07/27/22, diagnostic PSG on 08/23/22.    Please call and notify the patient that the recent sleep study showed mild sleep apnea. She had trouble going to sleep and staying asleep. Has leg twitching during sleep, in keeping with her Dx of RLS, for which she has been on ropinirole. I recommend treatment for her mild sleep apnea in the form of autoPAP, which means, that we don't have to bring her back for a second sleep study with CPAP, but will let him try an autoPAP machine at home, through a DME company (of her choice, or as per insurance requirement). The DME representative will educate her on how to use the machine, how to put the mask on, etc. I have placed an order in the chart. Please send referral, talk to patient, send report to referring MD. We will need a FU in sleep clinic for 10 weeks post-PAP set up, please arrange that with me or one of our NPs. Thanks,   Star Age, MD, PhD [facility])

## 2022-09-23 ENCOUNTER — Ambulatory Visit: Payer: Medicare Other | Admitting: Family

## 2022-09-23 ENCOUNTER — Inpatient Hospital Stay: Payer: Medicare Other

## 2022-09-23 ENCOUNTER — Other Ambulatory Visit: Payer: Medicare Other

## 2022-09-23 ENCOUNTER — Inpatient Hospital Stay: Payer: Medicare Other | Admitting: Family

## 2022-09-26 ENCOUNTER — Other Ambulatory Visit: Payer: Self-pay | Admitting: Hematology & Oncology

## 2022-10-03 ENCOUNTER — Inpatient Hospital Stay: Payer: Medicare Other

## 2022-10-03 ENCOUNTER — Inpatient Hospital Stay: Payer: Medicare Other | Admitting: Family

## 2022-10-17 ENCOUNTER — Inpatient Hospital Stay: Payer: Medicare Other | Attending: Hematology & Oncology

## 2022-10-17 ENCOUNTER — Encounter: Payer: Self-pay | Admitting: Family

## 2022-10-17 ENCOUNTER — Telehealth: Payer: Self-pay | Admitting: *Deleted

## 2022-10-17 ENCOUNTER — Inpatient Hospital Stay (HOSPITAL_BASED_OUTPATIENT_CLINIC_OR_DEPARTMENT_OTHER): Payer: Medicare Other | Admitting: Family

## 2022-10-17 VITALS — BP 148/74 | HR 87 | Temp 97.6°F | Resp 17 | Wt 149.1 lb

## 2022-10-17 DIAGNOSIS — Z86718 Personal history of other venous thrombosis and embolism: Secondary | ICD-10-CM | POA: Insufficient documentation

## 2022-10-17 DIAGNOSIS — Z86711 Personal history of pulmonary embolism: Secondary | ICD-10-CM | POA: Diagnosis present

## 2022-10-17 DIAGNOSIS — I2601 Septic pulmonary embolism with acute cor pulmonale: Secondary | ICD-10-CM

## 2022-10-17 DIAGNOSIS — Z7901 Long term (current) use of anticoagulants: Secondary | ICD-10-CM

## 2022-10-17 DIAGNOSIS — R791 Abnormal coagulation profile: Secondary | ICD-10-CM

## 2022-10-17 DIAGNOSIS — R760 Raised antibody titer: Secondary | ICD-10-CM | POA: Diagnosis not present

## 2022-10-17 DIAGNOSIS — I2699 Other pulmonary embolism without acute cor pulmonale: Secondary | ICD-10-CM | POA: Diagnosis not present

## 2022-10-17 DIAGNOSIS — Z7982 Long term (current) use of aspirin: Secondary | ICD-10-CM | POA: Diagnosis not present

## 2022-10-17 LAB — CMP (CANCER CENTER ONLY)
ALT: 15 U/L (ref 0–44)
AST: 21 U/L (ref 15–41)
Albumin: 4.3 g/dL (ref 3.5–5.0)
Alkaline Phosphatase: 92 U/L (ref 38–126)
Anion gap: 10 (ref 5–15)
BUN: 10 mg/dL (ref 8–23)
CO2: 27 mmol/L (ref 22–32)
Calcium: 10.4 mg/dL — ABNORMAL HIGH (ref 8.9–10.3)
Chloride: 105 mmol/L (ref 98–111)
Creatinine: 1.1 mg/dL — ABNORMAL HIGH (ref 0.44–1.00)
GFR, Estimated: 53 mL/min — ABNORMAL LOW (ref >60)
Glucose, Bld: 126 mg/dL — ABNORMAL HIGH (ref 70–99)
Potassium: 4.3 mmol/L (ref 3.5–5.1)
Sodium: 142 mmol/L (ref 135–145)
Total Bilirubin: 0.5 mg/dL (ref 0.3–1.2)
Total Protein: 6.7 g/dL (ref 6.5–8.1)

## 2022-10-17 LAB — CBC WITH DIFFERENTIAL (CANCER CENTER ONLY)
Abs Immature Granulocytes: 0.03 10*3/uL (ref 0.00–0.07)
Basophils Absolute: 0.1 10*3/uL (ref 0.0–0.1)
Basophils Relative: 1 %
Eosinophils Absolute: 0.2 10*3/uL (ref 0.0–0.5)
Eosinophils Relative: 2 %
HCT: 42.4 % (ref 36.0–46.0)
Hemoglobin: 13.7 g/dL (ref 12.0–15.0)
Immature Granulocytes: 0 %
Lymphocytes Relative: 60 %
Lymphs Abs: 6 10*3/uL — ABNORMAL HIGH (ref 0.7–4.0)
MCH: 30.6 pg (ref 26.0–34.0)
MCHC: 32.3 g/dL (ref 30.0–36.0)
MCV: 94.6 fL (ref 80.0–100.0)
Monocytes Absolute: 0.6 10*3/uL (ref 0.1–1.0)
Monocytes Relative: 6 %
Neutro Abs: 3.1 10*3/uL (ref 1.7–7.7)
Neutrophils Relative %: 31 %
Platelet Count: 285 10*3/uL (ref 150–400)
RBC: 4.48 MIL/uL (ref 3.87–5.11)
RDW: 13.5 % (ref 11.5–15.5)
Smear Review: NORMAL
WBC Count: 10 10*3/uL (ref 4.0–10.5)
nRBC: 0 % (ref 0.0–0.2)

## 2022-10-17 NOTE — Telephone Encounter (Signed)
Per 10/17/22 los - gave upcoming appointments - confirmed

## 2022-10-17 NOTE — Progress Notes (Signed)
Hematology and Oncology Follow Up Visit  [name] [id number] 01-11-1946 76 y.o. 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 q day -- maintanence on 09/06/2018 EC ASA 81 mg po q day   Interim History:  Ms. [name] is here today with her cousin for follow-up. She is soing well and states that she had an issue with watery diarrhea for 3 weeks. This is finally starting to improve. She was assessed by her PCP and had labs and stool samples taken. She is waiting to heat the results.  No fever, chills, n/v, cough, rash, dizziness, chest pain, palpitations, abdominal pain or changes in bladder habits.  Mild SOB with over exertion. She will take a break to rest when needed.  No blood loss, bruising or petechiae.  No swelling, numbness or tingling in her extremities.  No falls or syncope. She ambulates with a cane for added support.  Appetite and hydration are good. Weight is stable at 149 lbs.   ECOG Performance Status: 1 - Symptomatic but completely ambulatory  Medications:  Allergies as of 10/17/2022       Reactions   Naproxen Shortness Of Breath, Itching, Swelling   Tizanidine Shortness Of Breath   dizziness   Naprosyn [naproxen]    Unknown reaction        Medication List        Accurate as of October 17, 2022 10:32 AM. If you have any questions, ask your nurse or doctor.          acetaminophen 650 MG CR tablet Commonly known as: TYLENOL Take 650 mg by mouth every 8 (eight) hours as needed for pain.   Advair Diskus 250-50 MCG/ACT Aepb Generic drug: fluticasone-salmeterol Inhale one puff into the lungs 2 (two) times daily.   albuterol 108 (90 Base) MCG/ACT inhaler Commonly known as: ProAir HFA Inhale 2 puffs into the lungs every 4 (four) hours as needed for wheezing or shortness of  breath.   albuterol (2.5 MG/3ML) 0.083% nebulizer solution Commonly known as: PROVENTIL Take 3 mLs (2.5 mg total) by nebulization every 6 (six) hours as needed for wheezing or shortness of breath.   amLODipine 10 MG tablet Commonly known as: NORVASC Take 10 mg by mouth daily.   aspirin EC 81 MG tablet Take 81 mg by mouth daily. Swallow whole.   budesonide 0.25 MG/2ML nebulizer solution Commonly known as: Pulmicort Take 2 mLs (0.25 mg total) by nebulization 2 (two) times daily. Dx: J67.34 What changed:  when to take this reasons to take this   buPROPion 300 MG 24 hr tablet Commonly known as: WELLBUTRIN XL Take 300 mg by mouth every morning.   cetirizine 10 MG tablet Commonly known as: ZYRTEC Take 10 mg by mouth daily.   co-enzyme Q-10 30 MG capsule Take 30 mg by mouth 3 (three) times daily. Gummies   diphenhydrAMINE 25 MG tablet Commonly known as: SOMINEX Take 25 mg by mouth at bedtime as needed.   donepezil 10 MG tablet Commonly known as: ARICEPT Take 10 mg by mouth at bedtime.   DSS 250 MG Caps Take 250 mg by mouth daily.   famotidine 20 MG tablet Commonly known as: PEPCID TAKE ONE TABLET BY MOUTH AT BEDTIME What changed: additional instructions   ferrous sulfate 324 (65 Fe) MG Tbec Take 1 tablet (325 mg total) by mouth  daily.   fluticasone 50 MCG/ACT nasal spray Commonly known as: FLONASE Place 2 sprays into both nostrils daily.   latanoprost 0.005 % ophthalmic solution Commonly known as: XALATAN Place 1 drop into both eyes at bedtime.   levothyroxine 50 MCG tablet Commonly known as: SYNTHROID Take 50 mcg by mouth daily.   meclizine 25 MG tablet Commonly known as: ANTIVERT Take 25-50 mg by mouth 3 (three) times daily.   memantine 5 MG tablet Commonly known as: NAMENDA Take 5 mg by mouth 2 (two) times daily.   montelukast 10 MG tablet Commonly known as: SINGULAIR Take 10 mg by mouth at bedtime.   pantoprazole 40 MG tablet Commonly known as:  PROTONIX Take 40 mg by mouth daily.   pregabalin 75 MG capsule Commonly known as: LYRICA TAKE ONE CAPSULE BY MOUTH TWICE DAILY   pregabalin 100 MG capsule Commonly known as: LYRICA Take 100 mg by mouth 2 (two) times daily.   rOPINIRole 2 MG tablet Commonly known as: REQUIP Take 2 mg by mouth at bedtime.   senna 8.6 MG Tabs tablet Commonly known as: SENOKOT Take 8.6 mg by mouth at bedtime.   venlafaxine XR 150 MG 24 hr capsule Commonly known as: EFFEXOR-XR Take 150 mg by mouth every morning.   Vitamin D (Ergocalciferol) 1.25 MG (50000 UNIT) Caps capsule Commonly known as: DRISDOL Take 50,000 Units by mouth every Sunday.   Xarelto 10 MG Tabs tablet Generic drug: rivaroxaban TAKE ONE TABLET BY MOUTH EVERY DAY WITH SUPPER        Allergies:  Allergies  Allergen Reactions   Naproxen Shortness Of Breath, Itching and Swelling   Tizanidine Shortness Of Breath    dizziness   Naprosyn [Naproxen]     Unknown reaction    Past Medical History, Surgical history, Social history, and Family History were reviewed and updated.  Review of Systems: All other 10 point review of systems is negative.   Physical Exam:  vitals were not taken for this visit.   Wt Readings from Last 3 Encounters:  07/27/22 156 lb (70.8 kg)  03/23/22 169 lb 1.3 oz (76.7 kg)  09/22/21 174 lb (78.9 kg)    Ocular: Sclerae unicteric, pupils equal, round and reactive to light Ear-nose-throat: Oropharynx clear, dentition fair Lymphatic: No cervical or supraclavicular adenopathy Lungs no rales or rhonchi, good excursion bilaterally Heart regular rate and rhythm, no murmur appreciated Abd soft, nontender, positive bowel sounds MSK no focal spinal tenderness, no joint edema Neuro: non-focal, well-oriented, appropriate affect Breasts: Deferred   Lab Results  Component Value Date   WBC 10.0 10/17/2022   HGB 13.7 10/17/2022   HCT 42.4 10/17/2022   MCV 94.6 10/17/2022   PLT 285 10/17/2022   Lab  Results  Component Value Date   FERRITIN 124 09/22/2021   IRON 60 09/22/2021   TIBC 249 09/22/2021   UIBC 188 09/22/2021   IRONPCTSAT 24 09/22/2021   Lab Results  Component Value Date   RBC 4.48 10/17/2022   No results found for: "KPAFRELGTCHN", "LAMBDASER", "KAPLAMBRATIO" No results found for: "IGGSERUM", "IGA", "IGMSERUM" No results found for: "TOTALPROTELP", "ALBUMINELP", "A1GS", "A2GS", "BETS", "BETA2SER", "GAMS", "MSPIKE", "SPEI"   Chemistry      Component Value Date/Time   NA 142 03/23/2022 0940   NA 141 11/03/2015 1117   K 4.1 03/23/2022 0940   CL 103 03/23/2022 0940   CO2 30 03/23/2022 0940   BUN 14 03/23/2022 0940   BUN 22 11/03/2015 1117   CREATININE 1.17 (H) 03/23/2022 0940  Component Value Date/Time   CALCIUM 10.3 03/23/2022 0940   ALKPHOS 104 03/23/2022 0940   AST 23 03/23/2022 0940   ALT 14 03/23/2022 0940   BILITOT 0.6 03/23/2022 0940       Impression and Plan: Ms. Blossom is a very pleasant 76 yo caucasian female with history of pulmonary emboli and lower extremity DVT. She had a transiently positive lupus anticoagulant.  She continues to do well. No s/s of new recurrence.   She will stay on her same regimen with Xarelto and 1 baby aspirin daily.  Follow-up in 6 months.    Lottie Dawson, NP 10/30/202310:32 AM

## 2022-10-18 LAB — BETA-2-GLYCOPROTEIN I ABS, IGG/M/A
Beta-2 Glyco I IgG: <9 GPI IgG units (ref 0–20)
Beta-2-Glycoprotein I IgA: <9 GPI IgA units (ref 0–25)
Beta-2-Glycoprotein I IgM: 31 GPI IgM units (ref 0–32)

## 2022-10-18 LAB — CARDIOLIPIN ANTIBODIES, IGG, IGM, IGA
Anticardiolipin IgA: <9 APL U/mL (ref 0–11)
Anticardiolipin IgG: <9 GPL U/mL (ref 0–14)
Anticardiolipin IgM: <9 MPL U/mL (ref 0–12)

## 2022-10-18 LAB — LUPUS ANTICOAGULANT PANEL
DRVVT: 63.6 s — ABNORMAL HIGH (ref 0.0–47.0)
PTT Lupus Anticoagulant: 28.7 s (ref 0.0–43.5)

## 2022-10-18 LAB — DRVVT CONFIRM: dRVVT Confirm: 1.3 ratio — ABNORMAL HIGH (ref 0.8–1.2)

## 2022-10-18 LAB — DRVVT MIX: dRVVT Mix: 53.6 s — ABNORMAL HIGH (ref 0.0–40.4)

## 2022-11-04 ENCOUNTER — Other Ambulatory Visit: Payer: Self-pay | Admitting: Hematology & Oncology

## 2022-11-29 ENCOUNTER — Telehealth: Payer: Self-pay | Admitting: Neurology

## 2022-11-29 NOTE — Telephone Encounter (Signed)
Called pt and left message with daughter stating that pt is needing to schedule her Initial Cpap visit. DME and between dates are in pt's SnapShot.

## 2022-12-06 NOTE — Telephone Encounter (Signed)
Phone rep did a f/u call, spoke with daughter who states pt will be returning the CPAP to the DME since she has not had any success with it.  This is FYI to POD 4

## 2022-12-08 NOTE — Telephone Encounter (Signed)
Pt did return cpap machine per [facility] (notifed [place] by fax).

## 2022-12-28 ENCOUNTER — Encounter: Payer: PRIVATE HEALTH INSURANCE | Admitting: Adult Health

## 2023-01-18 ENCOUNTER — Telehealth: Payer: Self-pay

## 2023-01-18 NOTE — Telephone Encounter (Signed)
Daughter Venida Jarvis) called in stating that pt would like to know if she could be put on another blood thinner that is not $500/month. Pt has been paying this but can no longer afford to pay that amount.  Per note from 02/2022 advised Sherri to contact pt's insurance company to inquire about coverage for a different medication in this category. Also gave daughter the Xarelto patient assistance contact info to see if pt would qualify.  Daughter to check with insurance company and then assistance program and will CB with an update.

## 2023-02-28 ENCOUNTER — Encounter (INDEPENDENT_AMBULATORY_CARE_PROVIDER_SITE_OTHER): Payer: Medicare Other | Admitting: Ophthalmology

## 2023-03-07 ENCOUNTER — Encounter (INDEPENDENT_AMBULATORY_CARE_PROVIDER_SITE_OTHER): Payer: Medicare Other | Admitting: Ophthalmology

## 2023-03-07 DIAGNOSIS — H43813 Vitreous degeneration, bilateral: Secondary | ICD-10-CM

## 2023-03-07 DIAGNOSIS — H35371 Puckering of macula, right eye: Secondary | ICD-10-CM | POA: Diagnosis not present

## 2023-03-07 DIAGNOSIS — I10 Essential (primary) hypertension: Secondary | ICD-10-CM | POA: Diagnosis not present

## 2023-03-07 DIAGNOSIS — H35033 Hypertensive retinopathy, bilateral: Secondary | ICD-10-CM | POA: Diagnosis not present

## 2023-03-25 ENCOUNTER — Other Ambulatory Visit: Payer: Self-pay

## 2023-03-25 ENCOUNTER — Emergency Department (HOSPITAL_BASED_OUTPATIENT_CLINIC_OR_DEPARTMENT_OTHER): Payer: Medicare Other

## 2023-03-25 ENCOUNTER — Emergency Department (HOSPITAL_BASED_OUTPATIENT_CLINIC_OR_DEPARTMENT_OTHER): Payer: Medicare Other | Admitting: Radiology

## 2023-03-25 ENCOUNTER — Emergency Department (HOSPITAL_BASED_OUTPATIENT_CLINIC_OR_DEPARTMENT_OTHER)
Admission: EM | Admit: 2023-03-25 | Discharge: 2023-03-25 | Disposition: A | Discharge status: Home / Self Care | Payer: Medicare Other | Attending: Emergency Medicine | Admitting: Emergency Medicine

## 2023-03-25 DIAGNOSIS — Z7982 Long term (current) use of aspirin: Secondary | ICD-10-CM | POA: Diagnosis not present

## 2023-03-25 DIAGNOSIS — W19XXXA Unspecified fall, initial encounter: Secondary | ICD-10-CM

## 2023-03-25 DIAGNOSIS — S6991XA Unspecified injury of right wrist, hand and finger(s), initial encounter: Secondary | ICD-10-CM | POA: Diagnosis present

## 2023-03-25 DIAGNOSIS — Z7901 Long term (current) use of anticoagulants: Secondary | ICD-10-CM | POA: Diagnosis not present

## 2023-03-25 DIAGNOSIS — S0083XA Contusion of other part of head, initial encounter: Secondary | ICD-10-CM | POA: Diagnosis not present

## 2023-03-25 DIAGNOSIS — S8002XA Contusion of left knee, initial encounter: Secondary | ICD-10-CM

## 2023-03-25 DIAGNOSIS — S61411A Laceration without foreign body of right hand, initial encounter: Secondary | ICD-10-CM | POA: Diagnosis not present

## 2023-03-25 NOTE — Discharge Instructions (Signed)
1.  Follow-up head injury precautionary statement.  Return if you get a bad headache, confusion, vomiting or other concerning changes.  Is possible to have delayed bleeding episode when you are anticoagulated. 2.  Sleep with the head of the bed at about a 30 degree angle.  This will help with facial swelling and bruising.  You may also apply an ice pack over the area of swelling.  Protect your skin with the clean cloth.  Frozen vegetables work well for ice packs on facial structures. 3.  Take extra strength Tylenol as needed for pain. 4.  Leave the Steri-Strips in place on your hand cover your wound and let the wound start to heal in place.  The Steri-Strips will peel off.  Then start applying antibiotic ointment and dressing.  Continue to keep the wound clean and dressed until it is fully healed.  Keep your other abrasions clean and antibiotic ointment and dressing over them until healed. 5.  Your x-rays did not show any broken bones.  You may see new areas of swelling and bruising due to the high incidence of bruising with anticoagulation on Xarelto.  Return if you are finding areas of significant pain or any concerning changes. 6.  Make an appointment to see your doctor for recheck within the next 2 to 3 days or soon as possible.

## 2023-03-25 NOTE — ED Triage Notes (Addendum)
Patient arrives with complaints of having a mechanical fall earlier today. Patient was outside her home when she fell while carrying several things in her hand. Patient hit her head on the ground "hard" per her family. She also injured her left knee.   Hx of Vertigo

## 2023-03-25 NOTE — ED Provider Notes (Signed)
[facility]   CSN: [id number] Arrival date & time: 03/25/23  1448     History  Chief Complaint  [facility]   [facility]   [facility]    [name] is a 77 y.o. female.  HPI Patient is chronically anticoagulated with Xarelto.  She reports she was going to the end of her driveway to pick up some trash and bring her trash cans back up.  The wind blew and carried some things away she was turning and lost her balance and fell.  She reports that she struck the right side of her face on the concrete sidewalk.  She did not have loss of consciousness.  She denies any neck pain.  She has a pain in her cheek where she struck it on the sidewalk.  Also scraped there.  She scraped her left knee and reports that it is somewhat painful but she has been up and walking without difficulty.  Also abrasion to her right thumb.    Home Medications Prior to Admission medications   Medication Sig Start Date End Date Taking? Authorizing Provider  acetaminophen (TYLENOL) 650 MG CR tablet Take 650 mg by mouth every 8 (eight) hours as needed for pain.    [provider]  ADVAIR DISKUS 250-50 MCG/DOSE AEPB Inhale one puff into the lungs 2 (two) times daily. 10/01/20   Waldon MerlMartin, William C, PA-C  albuterol (PROAIR HFA) 108 (90 BASE) MCG/ACT inhaler Inhale 2 puffs into the lungs every 4 (four) hours as needed for wheezing or shortness of breath. 11/20/14   Jetty DuhamelYoung, Clinton D, MD  albuterol (PROVENTIL) (2.5 MG/3ML) 0.083% nebulizer solution Take 3 mLs (2.5 mg total) by nebulization every 6 (six) hours as needed for wheezing or shortness of breath. 11/26/14   Jetty DuhamelYoung, Clinton D, MD  amLODipine (NORVASC) 10 MG tablet Take 10 mg by mouth daily.     [provider]  aspirin EC 81 MG tablet Take 81 mg by mouth daily. Swallow whole.    [provider]  budesonide (PULMICORT) 0.25 MG/2ML nebulizer solution Take 2 mLs (0.25 mg total) by  nebulization 2 (two) times daily. Dx: W09.81J45.20 Patient taking differently: Take 0.25 mg by nebulization as needed. Dx: J45.20 03/01/18   Parrett, Virgel Bouquetammy S, NP  buPROPion (WELLBUTRIN XL) 300 MG 24 hr tablet Take 300 mg by mouth every morning. 08/06/20   [provider]  cetirizine (ZYRTEC) 10 MG tablet Take 10 mg by mouth daily.    [provider]  co-enzyme Q-10 30 MG capsule Take 30 mg by mouth 3 (three) times daily. Gummies    [provider]  diphenhydrAMINE (SOMINEX) 25 MG tablet Take 25 mg by mouth at bedtime as needed.    [provider]  Docusate Sodium (DSS) 250 MG CAPS Take 250 mg by mouth daily.    [provider]  donepezil (ARICEPT) 10 MG tablet Take 10 mg by mouth at bedtime. 07/30/20   [provider]  famotidine (PEPCID) 20 MG tablet TAKE ONE TABLET BY MOUTH AT BEDTIME Patient taking differently: Take 20 mg by mouth at bedtime. As needed 10/27/20   Waldon MerlMartin, William C, PA-C  ferrous sulfate 324 (65 Fe) MG TBEC Take 1 tablet (325 mg total) by mouth daily. 08/10/20   Waldon MerlMartin, William C, PA-C  fluticasone (FLONASE) 50 MCG/ACT nasal spray Place 2 sprays into both nostrils daily. 10/22/21   Daphine DeutscherMartin, Mary-Margaret, FNP  latanoprost (XALATAN) 0.005 % ophthalmic solution  Place 1 drop into both eyes at bedtime.  08/18/15   [provider]  levothyroxine (SYNTHROID) 50 MCG tablet Take 50 mcg by mouth daily. 11/02/20   [provider]  meclizine (ANTIVERT) 25 MG tablet Take 25-50 mg by mouth 3 (three) times daily. 09/14/20   [provider]  memantine (NAMENDA) 5 MG tablet Take 5 mg by mouth 2 (two) times daily. 12/03/20   [provider]  montelukast (SINGULAIR) 10 MG tablet Take 10 mg by mouth at bedtime.    [provider]  pantoprazole (PROTONIX) 40 MG tablet Take 40 mg by mouth daily.  08/29/15   [provider]  pregabalin (LYRICA) 100 MG capsule Take 100 mg by mouth 2 (two) times daily. 12/03/20    [provider]  pregabalin (LYRICA) 75 MG capsule TAKE ONE CAPSULE BY MOUTH TWICE DAILY 09/15/20   Waldon Merl, PA-C  rOPINIRole (REQUIP) 2 MG tablet Take 2 mg by mouth at bedtime.  11/10/14   [provider]  senna (SENOKOT) 8.6 MG TABS tablet Take 8.6 mg by mouth at bedtime.  10/30/18   [provider]  venlafaxine XR (EFFEXOR-XR) 150 MG 24 hr capsule Take 150 mg by mouth every morning. 09/30/20   [provider]  Vitamin D, Ergocalciferol, (DRISDOL) 1.25 MG (50000 UNIT) CAPS capsule Take 50,000 Units by mouth every Sunday. 09/21/20   [provider]  XARELTO 10 MG TABS tablet TAKE ONE TABLET BY MOUTH EVERY DAY WITH SUPPER 11/05/22   Josph Macho, MD      Allergies    Naproxen, Tizanidine, and Naprosyn [naproxen]    Review of Systems   Review of Systems  Physical Exam Updated Vital Signs BP (!) 153/69 (BP Location: Right Arm)   Pulse 91   Temp 97.9 F (36.6 C) (Oral)   Resp 20   Ht 5\' 1"  (1.549 m)   Wt 64.4 kg   SpO2 100%   BMI 26.83 kg/m  Physical Exam Constitutional:      Comments: Alert nontoxic clinically well in appearance.  No respiratory distress.  GCS 15.  HENT:     Head:     Comments: Mild swelling above the right brow with a superficial laceration without gaping.  Just to the superficial dermis levels.  Mild swelling over the right zygoma.  There is a 4 mm x 3 mm skin tear.  This is the deep dermis but not full laceration.    Mouth/Throat:     Mouth: Mucous membranes are moist.     Pharynx: Oropharynx is clear.  Eyes:     Extraocular Movements: Extraocular movements intact.     Pupils: Pupils are equal, round, and reactive to light.  Neck:     Comments: No midline C-spine tenderness. Cardiovascular:     Rate and Rhythm: Normal rate and regular rhythm.  Pulmonary:     Effort: Pulmonary effort is normal.     Breath sounds: Normal breath sounds.  Chest:     Chest wall: No tenderness.  Abdominal:      General: There is no distension.     Palpations: Abdomen is soft.     Tenderness: There is no abdominal tenderness. There is no guarding.  Musculoskeletal:     Comments: Right upper extremity has skin tear at the base of the thumb.  This is a flap skin tear of the deeper dermis.  6 mm x 3 mm.  No deformity of the thumb.  Normal range of  motion both upper extremities except for around the thumb.  Superficial abrasion to the left knee.  No active bleeding.  Mild swelling.  Intact range of motion.  Skin:    General: Skin is warm and dry.  Neurological:     General: No focal deficit present.     Mental Status: She is oriented to person, place, and time.     Motor: No weakness.     Coordination: Coordination normal.  Psychiatric:        Mood and Affect: Mood normal.     ED Results / Procedures / Treatments   Labs (all labs ordered are listed, but only abnormal results are displayed) Labs Reviewed - No data to display  EKG None  Radiology CT Head Wo Contrast  Result Date: 03/25/2023 CLINICAL DATA:  Provided history: Head trauma, minor. EXAM: CT HEAD WITHOUT CONTRAST TECHNIQUE: Contiguous axial images were obtained from the base of the skull through the vertex without intravenous contrast. RADIATION DOSE REDUCTION: This exam was performed according to the departmental dose-optimization program which includes automated exposure control, adjustment of the mA and/or kV according to patient size and/or use of iterative reconstruction technique. COMPARISON:  Head CT 12/08/2020. CT of the paranasal sinuses 01/07/2022. FINDINGS: Brain: Mild generalized cerebral atrophy. There is no acute intracranial hemorrhage. No demarcated cortical infarct. No extra-axial fluid collection. No evidence of an intracranial mass. No midline shift. Vascular: No hyperdense vessel.  Atherosclerotic calcifications. Skull: No fracture or aggressive osseous lesion. Sinuses/Orbits: No mass or acute finding within the imaged  orbits. Small to moderate-volume secretions within the right sphenoid sinus. IMPRESSION: 1.  No evidence of an acute intracranial abnormality. 2. Mild generalized cerebral atrophy. 3. Small to moderate-volume secretions within the right sphenoid sinus Electronically Signed   By: Jackey Loge D.O.   On: 03/25/2023 16:01   DG Hand Complete Right  Result Date: 03/25/2023 CLINICAL DATA:  Right hand pain following a fall. EXAM: RIGHT HAND - COMPLETE 3+ VIEW COMPARISON:  None Available. FINDINGS: Degenerative changes with some Gull wing deformities involving all of the interphalangeal joints of the hand. Mild 1st MCP joint degenerative changes and hyperextension of the thumb at the MCP joint with corticated bony fragmentation. Degenerative changes at the 1st metacarpal/carpal joint with extensive spur formation and corticated bony fragmentation. No acute fracture or dislocation. IMPRESSION: 1. No acute fracture or dislocation. 2. Extensive degenerative changes, as described above. Electronically Signed   By: Beckie Salts M.D.   On: 03/25/2023 15:57   DG Knee Complete 4 Views Left  Result Date: 03/25/2023 CLINICAL DATA:  Left knee pain following a fall. EXAM: LEFT KNEE - COMPLETE 4+ VIEW COMPARISON:  06/10/2021 FINDINGS: Again demonstrated is medial joint space narrowing and moderate to marked tricompartmental degenerative spur formation with associated lateral subluxation of the tibia relative to the femur. No fracture, dislocation, acute subluxation or effusion seen. IMPRESSION: 1. No fracture. 2. 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 Electronically Signed   By: Beckie Salts M.D.   On: 03/25/2023 15:54    Procedures Procedures    Medications Ordered in ED Medications - No data to display  ED Course/ Medical Decision Making/ A&P                             Medical Decision Making Amount and/or Complexity of Data Reviewed Radiology: ordered.   Patient has had mechanical fall  while taking Xarelto.  She does have a facial  contusion and a skin tear to the right cheek.  Will proceed with CT head.  The patient also has a skin tear around the base of the thumb on the right.  There is bruising to the left knee with superficial abrasion.  Patient has been able to bear weight.  Will proceed with x-rays of the knee and the hand wrist.  Mental status and function are intact.  CT head reviewed by radiology also personally reviewed by myself no acute findings or intracranial bleeding.  No fractures identified.  X-rays hand and knee reviewed by radiology no acute fractures present.  Other chronic changes present.  Patient is clinically well in appearance.  At this time with negative CT head and normal neurologic exam, will proceed with symptomatic care for abrasions and skin tears.  Wounds are cleaned and dressed in the emergency department.  No wounds requiring suture repair.  Instructions given for home management of wounds.  Instructions reviewed for careful return precautions and follow-up plan.  Patient and family members at bedside voiced understanding.        Final Clinical Impression(s) / ED Diagnoses Final diagnoses:  Fall, initial encounter  Anticoagulated  Contusion of face, initial encounter  Contusion of left knee, initial encounter  Skin tear of right hand without complication, initial encounter    Rx / DC Orders ED Discharge Orders     None         Arby BarrettePfeiffer, Caster Fayette, MD 03/25/23 [phone]

## 2023-04-18 ENCOUNTER — Ambulatory Visit: Payer: Medicare Other | Admitting: Family

## 2023-04-18 ENCOUNTER — Other Ambulatory Visit: Payer: Medicare Other

## 2023-05-19 ENCOUNTER — Encounter: Payer: Self-pay | Admitting: Family

## 2023-05-19 ENCOUNTER — Inpatient Hospital Stay: Payer: Medicare Other | Attending: Hematology & Oncology

## 2023-05-19 ENCOUNTER — Inpatient Hospital Stay (HOSPITAL_BASED_OUTPATIENT_CLINIC_OR_DEPARTMENT_OTHER): Payer: Medicare Other | Admitting: Family

## 2023-05-19 ENCOUNTER — Other Ambulatory Visit: Payer: Self-pay

## 2023-05-19 VITALS — BP 139/63 | HR 90 | Temp 97.9°F | Resp 19 | Ht 61.0 in | Wt 144.1 lb

## 2023-05-19 DIAGNOSIS — R760 Raised antibody titer: Secondary | ICD-10-CM | POA: Diagnosis not present

## 2023-05-19 DIAGNOSIS — Z886 Allergy status to analgesic agent status: Secondary | ICD-10-CM | POA: Insufficient documentation

## 2023-05-19 DIAGNOSIS — D6862 Lupus anticoagulant syndrome: Secondary | ICD-10-CM | POA: Insufficient documentation

## 2023-05-19 DIAGNOSIS — Z7901 Long term (current) use of anticoagulants: Secondary | ICD-10-CM | POA: Insufficient documentation

## 2023-05-19 DIAGNOSIS — I2601 Septic pulmonary embolism with acute cor pulmonale: Secondary | ICD-10-CM

## 2023-05-19 DIAGNOSIS — R202 Paresthesia of skin: Secondary | ICD-10-CM | POA: Diagnosis not present

## 2023-05-19 DIAGNOSIS — Z79899 Other long term (current) drug therapy: Secondary | ICD-10-CM | POA: Diagnosis not present

## 2023-05-19 DIAGNOSIS — Z86718 Personal history of other venous thrombosis and embolism: Secondary | ICD-10-CM | POA: Insufficient documentation

## 2023-05-19 DIAGNOSIS — Z9181 History of falling: Secondary | ICD-10-CM | POA: Insufficient documentation

## 2023-05-19 DIAGNOSIS — I2699 Other pulmonary embolism without acute cor pulmonale: Secondary | ICD-10-CM

## 2023-05-19 DIAGNOSIS — D72829 Elevated white blood cell count, unspecified: Secondary | ICD-10-CM | POA: Insufficient documentation

## 2023-05-19 DIAGNOSIS — S80211A Abrasion, right knee, initial encounter: Secondary | ICD-10-CM | POA: Diagnosis not present

## 2023-05-19 DIAGNOSIS — Z86711 Personal history of pulmonary embolism: Secondary | ICD-10-CM | POA: Diagnosis present

## 2023-05-19 LAB — CBC WITH DIFFERENTIAL (CANCER CENTER ONLY)
Abs Immature Granulocytes: 0.1 10*3/uL — ABNORMAL HIGH (ref 0.00–0.07)
Basophils Absolute: 0 10*3/uL (ref 0.0–0.1)
Basophils Relative: 0 %
Eosinophils Absolute: 0.2 10*3/uL (ref 0.0–0.5)
Eosinophils Relative: 1 %
HCT: 38.3 % (ref 36.0–46.0)
Hemoglobin: 12.6 g/dL (ref 12.0–15.0)
Immature Granulocytes: 1 %
Lymphocytes Relative: 69 %
Lymphs Abs: 14.5 10*3/uL — ABNORMAL HIGH (ref 0.7–4.0)
MCH: 30.4 pg (ref 26.0–34.0)
MCHC: 32.9 g/dL (ref 30.0–36.0)
MCV: 92.5 fL (ref 80.0–100.0)
Monocytes Absolute: 1.7 10*3/uL — ABNORMAL HIGH (ref 0.1–1.0)
Monocytes Relative: 8 %
Neutro Abs: 4.4 10*3/uL (ref 1.7–7.7)
Neutrophils Relative %: 21 %
Platelet Count: 232 10*3/uL (ref 150–400)
RBC: 4.14 MIL/uL (ref 3.87–5.11)
RDW: 13.9 % (ref 11.5–15.5)
Smear Review: NORMAL
WBC Count: 20.9 10*3/uL — ABNORMAL HIGH (ref 4.0–10.5)
nRBC: 0 % (ref 0.0–0.2)

## 2023-05-19 LAB — CMP (CANCER CENTER ONLY)
ALT: 11 U/L (ref 0–44)
AST: 15 U/L (ref 15–41)
Albumin: 4.2 g/dL (ref 3.5–5.0)
Alkaline Phosphatase: 103 U/L (ref 38–126)
Anion gap: 7 (ref 5–15)
BUN: 19 mg/dL (ref 8–23)
CO2: 27 mmol/L (ref 22–32)
Calcium: 10.1 mg/dL (ref 8.9–10.3)
Chloride: 102 mmol/L (ref 98–111)
Creatinine: 0.95 mg/dL (ref 0.44–1.00)
GFR, Estimated: >60 mL/min (ref >60)
Glucose, Bld: 121 mg/dL — ABNORMAL HIGH (ref 70–99)
Potassium: 4.3 mmol/L (ref 3.5–5.1)
Sodium: 136 mmol/L (ref 135–145)
Total Bilirubin: 0.5 mg/dL (ref 0.3–1.2)
Total Protein: 7 g/dL (ref 6.5–8.1)

## 2023-05-19 NOTE — Progress Notes (Signed)
Hematology and Oncology Follow Up Visit  [name] [id number] [birth year] 77 y.o. 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 q day -- maintanence on 09/06/2018 EC ASA 81 mg po q day   Interim History:  [name] is here today for follow-up. She had a fall back in April and had a laceration to her right brow and right thumb as well as abrasion to the right knee. No syncope reported. She had been picking up trash in her yard and the wind got her off balance.  She does have history of vertigo.  No fever, chills, n/v, cough, chest pain, palpitation, abdominal pain or changes in bowel or bladder habits.  Tingling in her feet unchanged from baseline.  No swelling noted in her extremities.  Appetite comes and goes. I encouraged her to try drinking a protein shake once a day.  Weight is 144 lbs. She is doing her best to stay hydrated.   ECOG Performance Status: 1 - Symptomatic but completely ambulatory  Medications:  Allergies as of 05/19/2023       Reactions   Naproxen Shortness Of Breath, Itching, Swelling   Tizanidine Shortness Of Breath   dizziness   Naprosyn [naproxen]    Unknown reaction        Medication List        Accurate as of May 19, 2023  2:31 PM. If you have any questions, ask your nurse or doctor.          acetaminophen 650 MG CR tablet Commonly known as: TYLENOL Take 650 mg by mouth every 8 (eight) hours as needed for pain.   Advair Diskus 250-50 MCG/ACT Aepb Generic drug: fluticasone-salmeterol Inhale one puff into the lungs 2 (two) times daily.   albuterol 108 (90 Base) MCG/ACT inhaler Commonly known as: ProAir HFA Inhale 2 puffs into the lungs every 4 (four) hours as needed for wheezing or shortness of breath.   albuterol (2.5 MG/3ML) 0.083% nebulizer  solution Commonly known as: PROVENTIL Take 3 mLs (2.5 mg total) by nebulization every 6 (six) hours as needed for wheezing or shortness of breath.   amLODipine 10 MG tablet Commonly known as: NORVASC Take 10 mg by mouth daily.   aspirin EC 81 MG tablet Take 81 mg by mouth daily. Swallow whole.   budesonide 0.25 MG/2ML nebulizer solution Commonly known as: Pulmicort Take 2 mLs (0.25 mg total) by nebulization 2 (two) times daily. Dx: W09.81 What changed:  when to take this reasons to take this   buPROPion 300 MG 24 hr tablet Commonly known as: WELLBUTRIN XL Take 300 mg by mouth every morning.   cetirizine 10 MG tablet Commonly known as: ZYRTEC Take 10 mg by mouth daily.   co-enzyme Q-10 30 MG capsule Take 30 mg by mouth 3 (three) times daily. Gummies   diphenhydrAMINE 25 MG tablet Commonly known as: SOMINEX Take 25 mg by mouth at bedtime as needed.   donepezil 10 MG tablet Commonly known as: ARICEPT Take 10 mg by mouth at bedtime.   DSS 250 MG Caps Take 250 mg by mouth daily.   famotidine 20 MG tablet Commonly known as: PEPCID TAKE ONE TABLET BY MOUTH AT BEDTIME What changed: additional instructions   ferrous sulfate 324 (65 Fe) MG Tbec Take 1 tablet (325 mg total) by mouth daily.   fluticasone  50 MCG/ACT nasal spray Commonly known as: FLONASE Place 2 sprays into both nostrils daily.   latanoprost 0.005 % ophthalmic solution Commonly known as: XALATAN Place 1 drop into both eyes at bedtime.   levothyroxine 50 MCG tablet Commonly known as: SYNTHROID Take 50 mcg by mouth daily.   meclizine 25 MG tablet Commonly known as: ANTIVERT Take 25-50 mg by mouth 3 (three) times daily.   memantine 5 MG tablet Commonly known as: NAMENDA Take 5 mg by mouth 2 (two) times daily.   montelukast 10 MG tablet Commonly known as: SINGULAIR Take 10 mg by mouth at bedtime.   pantoprazole 40 MG tablet Commonly known as: PROTONIX Take 40 mg by mouth daily.   pregabalin  75 MG capsule Commonly known as: LYRICA TAKE ONE CAPSULE BY MOUTH TWICE DAILY   pregabalin 100 MG capsule Commonly known as: LYRICA Take 100 mg by mouth 2 (two) times daily.   rOPINIRole 2 MG tablet Commonly known as: REQUIP Take 2 mg by mouth at bedtime.   senna 8.6 MG Tabs tablet Commonly known as: SENOKOT Take 8.6 mg by mouth at bedtime.   venlafaxine XR 150 MG 24 hr capsule Commonly known as: EFFEXOR-XR Take 150 mg by mouth every morning.   Vitamin D (Ergocalciferol) 1.25 MG (50000 UNIT) Caps capsule Commonly known as: DRISDOL Take 50,000 Units by mouth every Sunday.   Xarelto 10 MG Tabs tablet Generic drug: rivaroxaban TAKE ONE TABLET BY MOUTH EVERY DAY WITH SUPPER        Allergies:  Allergies  Allergen Reactions   Naproxen Shortness Of Breath, Itching and Swelling   Tizanidine Shortness Of Breath    dizziness   Naprosyn [Naproxen]     Unknown reaction    Past Medical History, Surgical history, Social history, and Family History were reviewed and updated.  Review of Systems: All other 10 point review of systems is negative.   Physical Exam:  vitals were not taken for this visit.   Wt Readings from Last 3 Encounters:  03/25/23 142 lb (64.4 kg)  10/17/22 149 lb 1.9 oz (67.6 kg)  07/27/22 156 lb (70.8 kg)    Ocular: Sclerae unicteric, pupils equal, round and reactive to light Ear-nose-throat: Oropharynx clear, dentition fair Lymphatic: No cervical or supraclavicular adenopathy Lungs no rales or rhonchi, good excursion bilaterally Heart regular rate and rhythm, no murmur appreciated Abd soft, nontender, positive bowel sounds MSK no focal spinal tenderness, no joint edema Neuro: non-focal, well-oriented, appropriate affect Breasts: Deferred   Lab Results  Component Value Date   WBC 20.9 (H) 05/19/2023   HGB 12.6 05/19/2023   HCT 38.3 05/19/2023   MCV 92.5 05/19/2023   PLT 232 05/19/2023   Lab Results  Component Value Date   FERRITIN 124  09/22/2021   IRON 60 09/22/2021   TIBC 249 09/22/2021   UIBC 188 09/22/2021   IRONPCTSAT 24 09/22/2021   Lab Results  Component Value Date   RBC 4.14 05/19/2023   No results found for: "KPAFRELGTCHN", "LAMBDASER", "KAPLAMBRATIO" No results found for: "IGGSERUM", "IGA", "IGMSERUM" No results found for: "TOTALPROTELP", "ALBUMINELP", "A1GS", "A2GS", "BETS", "BETA2SER", "GAMS", "MSPIKE", "SPEI"   Chemistry      Component Value Date/Time   NA 142 10/17/2022 1003   NA 141 11/03/2015 1117   K 4.3 10/17/2022 1003   CL 105 10/17/2022 1003   CO2 27 10/17/2022 1003   BUN 10 10/17/2022 1003   BUN 22 11/03/2015 1117   CREATININE 1.10 (H) 10/17/2022 1003  Component Value Date/Time   CALCIUM 10.4 (H) 10/17/2022 1003   ALKPHOS 92 10/17/2022 1003   AST 21 10/17/2022 1003   ALT 15 10/17/2022 1003   BILITOT 0.5 10/17/2022 1003       Impression and Plan: [name] is a very pleasant 77 yo caucasian female with history of pulmonary emboli and lower extremity DVT. She had a transiently positive lupus anticoagulant.   She continues to do well. No s/s of new recurrence.   She will stay on her same regimen with Xarelto and 1 baby aspirin daily.  I spoke with Dr. Myna Hidalgo and reviewed her CBC with Diff. Elevated WBC count felt to be reactive with recent fall.  Follow-up in 6 months.   Eileen Stanford, NP 5/31/20242:31 PM

## 2023-05-21 LAB — CARDIOLIPIN ANTIBODIES, IGG, IGM, IGA
Anticardiolipin IgA: <9 APL U/mL (ref 0–11)
Anticardiolipin IgG: <9 GPL U/mL (ref 0–14)
Anticardiolipin IgM: <9 MPL U/mL (ref 0–12)

## 2023-05-21 LAB — LUPUS ANTICOAGULANT PANEL
DRVVT: 34 s (ref 0.0–47.0)
PTT Lupus Anticoagulant: 24.8 s (ref 0.0–43.5)

## 2023-05-22 LAB — BETA-2-GLYCOPROTEIN I ABS, IGG/M/A
Beta-2 Glyco I IgG: <9 GPI IgG units (ref 0–20)
Beta-2-Glycoprotein I IgA: <9 GPI IgA units (ref 0–25)
Beta-2-Glycoprotein I IgM: 19 GPI IgM units (ref 0–32)

## 2023-07-03 ENCOUNTER — Encounter (INDEPENDENT_AMBULATORY_CARE_PROVIDER_SITE_OTHER): Payer: Medicare Other | Admitting: Ophthalmology

## 2023-10-04 ENCOUNTER — Other Ambulatory Visit: Payer: Self-pay | Admitting: Hematology & Oncology

## 2023-11-08 ENCOUNTER — Other Ambulatory Visit: Payer: Medicare Other

## 2023-11-08 ENCOUNTER — Ambulatory Visit: Payer: Medicare Other | Admitting: Hematology & Oncology

## 2023-12-01 ENCOUNTER — Ambulatory Visit: Payer: Medicare Other | Admitting: Hematology & Oncology

## 2023-12-01 ENCOUNTER — Other Ambulatory Visit: Payer: Medicare Other

## 2024-01-04 ENCOUNTER — Ambulatory Visit: Payer: Medicare Other | Admitting: Hematology & Oncology

## 2024-01-04 ENCOUNTER — Inpatient Hospital Stay: Payer: Medicare Other

## 2024-04-24 ENCOUNTER — Other Ambulatory Visit: Payer: Self-pay

## 2024-04-24 ENCOUNTER — Inpatient Hospital Stay: Attending: Hematology & Oncology

## 2024-04-24 ENCOUNTER — Inpatient Hospital Stay (HOSPITAL_BASED_OUTPATIENT_CLINIC_OR_DEPARTMENT_OTHER): Admitting: Hematology & Oncology

## 2024-04-24 ENCOUNTER — Encounter: Payer: Self-pay | Admitting: Hematology & Oncology

## 2024-04-24 VITALS — BP 136/66 | HR 104 | Temp 97.9°F | Resp 20 | Ht 62.0 in | Wt 144.1 lb

## 2024-04-24 DIAGNOSIS — C9110 Chronic lymphocytic leukemia of B-cell type not having achieved remission: Secondary | ICD-10-CM | POA: Diagnosis not present

## 2024-04-24 DIAGNOSIS — Z86711 Personal history of pulmonary embolism: Secondary | ICD-10-CM | POA: Insufficient documentation

## 2024-04-24 DIAGNOSIS — Z79899 Other long term (current) drug therapy: Secondary | ICD-10-CM | POA: Diagnosis not present

## 2024-04-24 DIAGNOSIS — Z7901 Long term (current) use of anticoagulants: Secondary | ICD-10-CM | POA: Diagnosis not present

## 2024-04-24 DIAGNOSIS — Z886 Allergy status to analgesic agent status: Secondary | ICD-10-CM | POA: Insufficient documentation

## 2024-04-24 DIAGNOSIS — R634 Abnormal weight loss: Secondary | ICD-10-CM | POA: Insufficient documentation

## 2024-04-24 DIAGNOSIS — D6862 Lupus anticoagulant syndrome: Secondary | ICD-10-CM | POA: Insufficient documentation

## 2024-04-24 DIAGNOSIS — R760 Raised antibody titer: Secondary | ICD-10-CM

## 2024-04-24 DIAGNOSIS — I2699 Other pulmonary embolism without acute cor pulmonale: Secondary | ICD-10-CM | POA: Diagnosis present

## 2024-04-24 DIAGNOSIS — I2601 Septic pulmonary embolism with acute cor pulmonale: Secondary | ICD-10-CM

## 2024-04-24 DIAGNOSIS — R0602 Shortness of breath: Secondary | ICD-10-CM | POA: Insufficient documentation

## 2024-04-24 DIAGNOSIS — Z86718 Personal history of other venous thrombosis and embolism: Secondary | ICD-10-CM | POA: Diagnosis not present

## 2024-04-24 HISTORY — DX: Chronic lymphocytic leukemia of B-cell type not having achieved remission: C91.10

## 2024-04-24 LAB — CBC WITH DIFFERENTIAL (CANCER CENTER ONLY)
Abs Immature Granulocytes: 0.25 10*3/uL — ABNORMAL HIGH (ref 0.00–0.07)
Basophils Absolute: 0.2 10*3/uL — ABNORMAL HIGH (ref 0.0–0.1)
Basophils Relative: 0 %
Eosinophils Absolute: 0.1 10*3/uL (ref 0.0–0.5)
Eosinophils Relative: 0 %
HCT: 30.3 % — ABNORMAL LOW (ref 36.0–46.0)
Hemoglobin: 9.4 g/dL — ABNORMAL LOW (ref 12.0–15.0)
Immature Granulocytes: 1 %
Lymphocytes Relative: 78 %
Lymphs Abs: 29.8 10*3/uL — ABNORMAL HIGH (ref 0.7–4.0)
MCH: 28.1 pg (ref 26.0–34.0)
MCHC: 31 g/dL (ref 30.0–36.0)
MCV: 90.4 fL (ref 80.0–100.0)
Monocytes Absolute: 3.9 10*3/uL — ABNORMAL HIGH (ref 0.1–1.0)
Monocytes Relative: 10 %
Neutro Abs: 4.1 10*3/uL (ref 1.7–7.7)
Neutrophils Relative %: 11 %
Platelet Count: 243 10*3/uL (ref 150–400)
RBC: 3.35 MIL/uL — ABNORMAL LOW (ref 3.87–5.11)
RDW: 16.5 % — ABNORMAL HIGH (ref 11.5–15.5)
Smear Review: NORMAL
WBC Count: 38.3 10*3/uL — ABNORMAL HIGH (ref 4.0–10.5)
nRBC: 0 % (ref 0.0–0.2)

## 2024-04-24 LAB — CMP (CANCER CENTER ONLY)
ALT: 9 U/L (ref 0–44)
AST: 13 U/L — ABNORMAL LOW (ref 15–41)
Albumin: 4.3 g/dL (ref 3.5–5.0)
Alkaline Phosphatase: 126 U/L (ref 38–126)
Anion gap: 12 (ref 5–15)
BUN: 15 mg/dL (ref 8–23)
CO2: 24 mmol/L (ref 22–32)
Calcium: 9.8 mg/dL (ref 8.9–10.3)
Chloride: 103 mmol/L (ref 98–111)
Creatinine: 0.99 mg/dL (ref 0.44–1.00)
GFR, Estimated: 59 mL/min — ABNORMAL LOW (ref >60)
Glucose, Bld: 123 mg/dL — ABNORMAL HIGH (ref 70–99)
Potassium: 4 mmol/L (ref 3.5–5.1)
Sodium: 139 mmol/L (ref 135–145)
Total Bilirubin: 0.6 mg/dL (ref 0.0–1.2)
Total Protein: 7.3 g/dL (ref 6.5–8.1)

## 2024-04-24 NOTE — Progress Notes (Signed)
 Hematology and Oncology Follow Up Visit  [name] [id number] [birth year] 78 y.o. 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 q day -- maintanence on 09/06/2018 EC ASA 81 mg po q day   Interim History:  [name] is here today for follow-up.  Unfortunately, I think that we do have a new problem with her.  Her white cell count has been going up quite a bit.  A year ago, white cell count was 20,000.  Today, the white cell count is 38,000.  Her hemoglobin is dropped to 9.4.  Her lymphocytes are 78%.  I have to believe that she has CLL.  We are sending off her peripheral blood for evaluation.  I suspect that she has bone marrow involvement.  I would think that she will have a low reticulocyte count.  Unfortunately, we do not have a blood smear made for me to look at.  She does feel short of breath.  I do worry about the possibility of adenopathy in the mediastinum that could be pressing on her airways.  I think we are to have to work quickly on this likely diagnosis of CLL.  She will need to have a bone marrow biopsy done.  She will need to have a CT of the neck and body.  She has had no fever.  She has had no obvious change in bowel or bladder habits.  There has been no rashes.  She has had no leg swelling.  There is been no bleeding.  She continues on Xarelto  and baby aspirin .  Currently, I would say that her performance status is probably ECOG 1.    Medications:  Allergies as of 04/24/2024       Reactions   Naprosyn [naproxen] Other (See Comments)   Angioedema   Naproxen Shortness Of Breath, Itching, Swelling   Tizanidine  Shortness Of Breath, Other (See Comments)   dizziness        Medication List        Accurate as of Apr 24, 2024  2:12 PM. If you have any  questions, ask your nurse or doctor.          STOP taking these medications    aspirin  EC 81 MG tablet Stopped by: [name]   senna 8.6 MG Tabs tablet Commonly known as: SENOKOT Stopped by: [name]       TAKE these medications    acetaminophen  650 MG CR tablet Commonly known as: TYLENOL  Take 650 mg by mouth every 8 (eight) hours as needed for pain.   Advair Diskus 250-50 MCG/DOSE Aepb Generic drug: fluticasone -salmeterol Inhale one puff into the lungs 2 (two) times daily.   albuterol  108 (90 Base) MCG/ACT inhaler Commonly known as: ProAir  HFA Inhale 2 puffs into the lungs every 4 (four) hours as needed for wheezing or shortness of breath.   albuterol  (2.5 MG/3ML) 0.083% nebulizer solution Commonly known as: PROVENTIL  Take 3 mLs (2.5 mg total) by nebulization every 6 (six) hours as needed for wheezing or shortness of breath.   amLODipine  10 MG tablet Commonly known as: NORVASC  Take 10 mg by mouth daily.   cetirizine 10 MG tablet Commonly known as: ZYRTEC Take 10 mg by mouth daily.   famotidine  20 MG tablet Commonly known as: PEPCID  TAKE ONE TABLET BY MOUTH AT BEDTIME  What changed: additional instructions   ferrous sulfate  324 (65 Fe) MG Tbec Take 1 tablet (325 mg total) by mouth daily.   fluticasone  50 MCG/ACT nasal spray Commonly known as: FLONASE  Place 2 sprays into both nostrils daily.   latanoprost  0.005 % ophthalmic solution Commonly known as: XALATAN  Place 1 drop into both eyes at bedtime.   levothyroxine  50 MCG tablet Commonly known as: SYNTHROID  Take 50 mcg by mouth daily.   meclizine  25 MG tablet Commonly known as: ANTIVERT  Take 25-50 mg by mouth 3 (three) times daily.   memantine 5 MG tablet Commonly known as: NAMENDA Take 5 mg by mouth 2 (two) times daily.   montelukast  10 MG tablet Commonly known as: SINGULAIR  Take 10 mg by mouth at bedtime.   pantoprazole  40 MG tablet Commonly known as: PROTONIX  Take 40 mg by  mouth daily.   rOPINIRole  2 MG tablet Commonly known as: REQUIP  Take 2 mg by mouth at bedtime.   traZODone 50 MG tablet Commonly known as: DESYREL Take 50 mg by mouth at bedtime.   venlafaxine  XR 150 MG 24 hr capsule Commonly known as: EFFEXOR -XR Take 150 mg by mouth every morning.   venlafaxine  XR 37.5 MG 24 hr capsule Commonly known as: EFFEXOR -XR Take 37.5 mg by mouth daily.   Vitamin D (Ergocalciferol) 1.25 MG (50000 UNIT) Caps capsule Commonly known as: DRISDOL Take 50,000 Units by mouth every Sunday.   Xarelto  10 MG Tabs tablet Generic drug: rivaroxaban  TAKE ONE TABLET BY MOUTH EVERY DAY WITH SUPPER        Allergies:  Allergies  Allergen Reactions   Naprosyn [Naproxen] Other (See Comments)    Angioedema   Naproxen Shortness Of Breath, Itching and Swelling   Tizanidine  Shortness Of Breath and Other (See Comments)    dizziness    Past Medical History, Surgical history, Social history, and Family History were reviewed and updated.  Review of Systems: Review of Systems  Constitutional:  Positive for weight loss.  HENT: Negative.    Eyes: Negative.   Respiratory:  Positive for shortness of breath.   Cardiovascular: Negative.   Gastrointestinal: Negative.   Genitourinary: Negative.   Musculoskeletal: Negative.   Skin: Negative.   Neurological: Negative.   Endo/Heme/Allergies: Negative.   Psychiatric/Behavioral: Negative.       Physical Exam:  height is 5\' 2"  (1.575 m) and weight is 144 lb 1.3 oz (65.4 kg). Her oral temperature is 97.9 F (36.6 C). Her blood pressure is 136/66 and her pulse is 104 (abnormal). Her respiration is 20 and oxygen  saturation is 97%.   Wt Readings from Last 3 Encounters:  04/24/24 144 lb 1.3 oz (65.4 kg)  05/19/23 144 lb 1.9 oz (65.4 kg)  03/25/23 142 lb (64.4 kg)    Physical Exam Vitals reviewed.  HENT:     Head: Normocephalic and atraumatic.  Eyes:     Pupils: Pupils are equal, round, and reactive to light.   Cardiovascular:     Rate and Rhythm: Normal rate and regular rhythm.     Heart sounds: Normal heart sounds.  Pulmonary:     Effort: Pulmonary effort is normal.     Breath sounds: Normal breath sounds.  Abdominal:     General: Bowel sounds are normal.     Palpations: Abdomen is soft.  Musculoskeletal:        General: No tenderness or deformity. Normal range of motion.     Cervical back: Normal range of motion.  Lymphadenopathy:     Cervical:  No cervical adenopathy.  Skin:    General: Skin is warm and dry.     Findings: No erythema or rash.  Neurological:     Mental Status: She is alert and oriented to person, place, and time.  Psychiatric:        Behavior: Behavior normal.        Thought Content: Thought content normal.        Judgment: Judgment normal.      Lab Results  Component Value Date   WBC 20.9 (H) 05/19/2023   HGB 12.6 05/19/2023   HCT 38.3 05/19/2023   MCV 92.5 05/19/2023   PLT 232 05/19/2023   Lab Results  Component Value Date   FERRITIN 124 09/22/2021   IRON 60 09/22/2021   TIBC 249 09/22/2021   UIBC 188 09/22/2021   IRONPCTSAT 24 09/22/2021   Lab Results  Component Value Date   RBC 4.14 05/19/2023   No results found for: "KPAFRELGTCHN", "LAMBDASER", "KAPLAMBRATIO" No results found for: "IGGSERUM", "IGA", "IGMSERUM" No results found for: "TOTALPROTELP", "ALBUMINELP", "A1GS", "A2GS", "BETS", "BETA2SER", "GAMS", "MSPIKE", "SPEI"   Chemistry      Component Value Date/Time   NA 136 05/19/2023 1420   NA 141 11/03/2015 1117   K 4.3 05/19/2023 1420   CL 102 05/19/2023 1420   CO2 27 05/19/2023 1420   BUN 19 05/19/2023 1420   BUN 22 11/03/2015 1117   CREATININE 0.95 05/19/2023 1420      Component Value Date/Time   CALCIUM 10.1 05/19/2023 1420   ALKPHOS 103 05/19/2023 1420   AST 15 05/19/2023 1420   ALT 11 05/19/2023 1420   BILITOT 0.5 05/19/2023 1420       Impression and Plan: Ms. Choate is a very pleasant 78 yo caucasian female with  history of pulmonary emboli and lower extremity DVT. She had a transiently positive lupus anticoagulant.  The last time that we saw her, there was no lupus anticoagulant.  Again, I suspect that there is a new problem with CLL.  We will see what the flow cytometry shows on her peripheral blood.  I do worry about the anemia.  We will  have to have her blood checked pretty frequently to make sure that her blood count does not drop and that she will need to be transfused.   I suspect that we probably will have to start treatment on her within a month.  I suspect that we probably will use a combination of Rituxan/Gazyva and then oral targeted therapy with one of the BKI inhibitors.  I would like to see her back probably in 3 or 4 weeks.  By then, if we should have the results back from her CAT scan and the bone marrow.   Ivor Mars, MD 5/7/20252:12 PM

## 2024-04-25 ENCOUNTER — Telehealth: Payer: Self-pay | Admitting: Hematology & Oncology

## 2024-04-25 NOTE — Telephone Encounter (Signed)
 Called and lvm for patient to give our office a call back to schedule a lab only appointment on Monday or Tuesday.

## 2024-04-26 LAB — CARDIOLIPIN ANTIBODIES, IGG, IGM, IGA
Anticardiolipin IgA: <9 U/mL (ref 0–11)
Anticardiolipin IgG: <9 GPL U/mL (ref 0–14)
Anticardiolipin IgM: 12 [MPL'U]/mL (ref 0–12)

## 2024-04-26 LAB — LUPUS ANTICOAGULANT PANEL
DRVVT: 48.7 s — ABNORMAL HIGH (ref 0.0–47.0)
PTT Lupus Anticoagulant: 24.4 s (ref 0.0–43.5)

## 2024-04-26 LAB — DRVVT MIX: dRVVT Mix: 45.2 s — ABNORMAL HIGH (ref 0.0–40.4)

## 2024-04-26 LAB — BETA-2-GLYCOPROTEIN I ABS, IGG/M/A
Beta-2 Glyco I IgG: <9 GPI IgG units (ref 0–20)
Beta-2-Glycoprotein I IgA: <9 GPI IgA units (ref 0–25)
Beta-2-Glycoprotein I IgM: <9 GPI IgM units (ref 0–32)

## 2024-04-26 LAB — DRVVT CONFIRM: dRVVT Confirm: 1.1 ratio (ref 0.8–1.2)

## 2024-04-29 ENCOUNTER — Inpatient Hospital Stay

## 2024-04-29 DIAGNOSIS — C9110 Chronic lymphocytic leukemia of B-cell type not having achieved remission: Secondary | ICD-10-CM

## 2024-04-29 DIAGNOSIS — I2699 Other pulmonary embolism without acute cor pulmonale: Secondary | ICD-10-CM | POA: Diagnosis not present

## 2024-04-29 LAB — CBC WITH DIFFERENTIAL (CANCER CENTER ONLY)
Abs Immature Granulocytes: 0.24 10*3/uL — ABNORMAL HIGH (ref 0.00–0.07)
Basophils Absolute: 0.2 10*3/uL — ABNORMAL HIGH (ref 0.0–0.1)
Basophils Relative: 0 %
Eosinophils Absolute: 0.1 10*3/uL (ref 0.0–0.5)
Eosinophils Relative: 0 %
HCT: 28.4 % — ABNORMAL LOW (ref 36.0–46.0)
Hemoglobin: 8.9 g/dL — ABNORMAL LOW (ref 12.0–15.0)
Immature Granulocytes: 1 %
Lymphocytes Relative: 78 %
Lymphs Abs: 32 10*3/uL — ABNORMAL HIGH (ref 0.7–4.0)
MCH: 28.2 pg (ref 26.0–34.0)
MCHC: 31.3 g/dL (ref 30.0–36.0)
MCV: 89.9 fL (ref 80.0–100.0)
Monocytes Absolute: 5 10*3/uL — ABNORMAL HIGH (ref 0.1–1.0)
Monocytes Relative: 12 %
Neutro Abs: 3.8 10*3/uL (ref 1.7–7.7)
Neutrophils Relative %: 9 %
Platelet Count: 234 10*3/uL (ref 150–400)
RBC: 3.16 MIL/uL — ABNORMAL LOW (ref 3.87–5.11)
RDW: 16.6 % — ABNORMAL HIGH (ref 11.5–15.5)
Smear Review: NORMAL
WBC Count: 41.3 10*3/uL — ABNORMAL HIGH (ref 4.0–10.5)
nRBC: 0 % (ref 0.0–0.2)

## 2024-04-29 LAB — SAVE SMEAR(SSMR), FOR PROVIDER SLIDE REVIEW

## 2024-04-29 LAB — RETICULOCYTES
Immature Retic Fract: 24.6 % — ABNORMAL HIGH (ref 2.3–15.9)
RBC.: 3.16 MIL/uL — ABNORMAL LOW (ref 3.87–5.11)
Retic Count, Absolute: 101.1 10*3/uL (ref 19.0–186.0)
Retic Ct Pct: 3.2 % — ABNORMAL HIGH (ref 0.4–3.1)

## 2024-05-01 LAB — SURGICAL PATHOLOGY

## 2024-05-02 LAB — FLOW CYTOMETRY

## 2024-05-03 ENCOUNTER — Ambulatory Visit (HOSPITAL_COMMUNITY)
Admission: RE | Admit: 2024-05-03 | Discharge: 2024-05-03 | Disposition: A | Discharge status: Home / Self Care | Source: Ambulatory Visit | Attending: Hematology & Oncology | Admitting: Hematology & Oncology

## 2024-05-03 DIAGNOSIS — C9110 Chronic lymphocytic leukemia of B-cell type not having achieved remission: Secondary | ICD-10-CM

## 2024-05-03 MED ORDER — IOHEXOL 300 MG/ML  SOLN
100.0000 mL | Freq: Once | INTRAMUSCULAR | Status: AC | PRN
  Administered 2024-05-03: 100 mL via INTRAVENOUS

## 2024-05-04 ENCOUNTER — Ambulatory Visit: Payer: Self-pay | Admitting: Hematology & Oncology

## 2024-05-13 ENCOUNTER — Other Ambulatory Visit: Payer: Self-pay | Admitting: Radiology

## 2024-05-13 DIAGNOSIS — C9110 Chronic lymphocytic leukemia of B-cell type not having achieved remission: Secondary | ICD-10-CM

## 2024-05-14 NOTE — H&P (Signed)
 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 female with a history of PE and DVT(xarelto , asa), htn, hypothyroidism, GERD, and newly diagnosed CLL. She is being treated by Dr. Maria Shiner for oncology care, last 04/24/24 in office. At that time, it was noted WBC 38, hgb 9.4, lymphocytes are 78%. As a part of ongoing workup and toevaluated for marrow involvement, additional labs, CT imaging, and IR bone marrow biopsy were ordered.  Patient presents today for image guided bone marrow biopsy and aspiration.   She has been NPO since [place] and has a ride available.    Patient is Full Code  Past Medical History:  Diagnosis Date   Allergic rhinitis    Allergic rhinitis    Anxiety    Arthritis    "hands, feet, shoulders, arms" (02/22/2018)   Bilateral pulmonary embolism (HCC) 02/21/2018   Chronic cough    CLL (chronic lymphocytic leukemia) (HCC) 04/24/2024   Depression    Disease of thyroid  gland    Dupuytren contracture    "both feet, both hands" (02/22/2018)   DVT (deep venous thrombosis) (HCC)    bil le   GERD (gastroesophageal reflux disease)    Glaucoma    Hypertension    Hypothyroidism    Mild persistent asthma    cougher not a wheezr gets chokes easily   Pneumonia    PONV (postoperative nausea and vomiting)     Past Surgical History:  Procedure Laterality Date   BLADDER SUSPENSION  X 2   BUNIONECTOMY     RIGHT FOOT ARCH REPAIRED AS WELL   CATARACT EXTRACTION W/ INTRAOCULAR LENS  IMPLANT, BILATERAL Bilateral    DILATION AND CURETTAGE OF UTERUS  X 3   DUPUYTREN CONTRACTURE RELEASE Bilateral    "feet"   FASCIECTOMY Right 01/23/2018   Procedure: SEGMENTAL FASCIECTOMY RIGHT MIDDLE FINGER;  Surgeon: Lyanne Sample, MD;   Location: [facility];  Service: Orthopedics;  Laterality: Right;  AXILLARY BLOCK   fracture surgery right tibia and ankle     KNEE ARTHROSCOPY Right    REDUCTION MAMMAPLASTY Bilateral 2018   TOTAL ABDOMINAL HYSTERECTOMY  1975   "got pregnant w/IUD in then lost baby"   TOTAL KNEE ARTHROPLASTY Right 06/08/2020   Procedure: TOTAL KNEE ARTHROPLASTY;  Surgeon: Christie Cox, MD;  Location: [facility];  Service: Orthopedics;  Laterality: Right;    Allergies: Naprosyn [naproxen], Naproxen, and Tizanidine   Medications: Prior to Admission medications   Medication Sig Start Date End Date Taking? Authorizing Provider  acetaminophen  (TYLENOL ) 650 MG CR tablet Take 650 mg by mouth every 8 (eight) hours as needed for pain.    [provider]  ADVAIR DISKUS 250-50 MCG/DOSE AEPB Inhale one puff into the lungs 2 (two) times daily. 10/01/20   Farris Hong, PA-C  albuterol  (PROAIR  HFA) 108 (90 BASE) MCG/ACT inhaler Inhale 2 puffs into the lungs every 4 (four) hours as needed for wheezing or shortness of breath. 11/20/14   Rosa College D, MD  albuterol  (PROVENTIL ) (2.5 MG/3ML) 0.083% nebulizer solution Take 3 mLs (2.5 mg total) by nebulization every 6 (six) hours as needed for wheezing or shortness of breath. Patient not taking: Reported on 04/24/2024 11/26/14   Rosa College D, MD  amLODipine  (NORVASC ) 10 MG tablet Take 10 mg  by mouth daily.     [provider]  cetirizine (ZYRTEC) 10 MG tablet Take 10 mg by mouth daily.    [provider]  famotidine  (PEPCID ) 20 MG tablet TAKE ONE TABLET BY MOUTH AT BEDTIME Patient taking differently: Take 20 mg by mouth at bedtime. As needed 10/27/20   Farris Hong, PA-C  ferrous sulfate  324 (65 Fe) MG TBEC Take 1 tablet (325 mg total) by mouth daily. 08/10/20   Farris Hong, PA-C  fluticasone  (FLONASE ) 50 MCG/ACT nasal spray Place 2 sprays into both nostrils daily. 10/22/21   Gaylyn Keas, Mary-Margaret, FNP  latanoprost  (XALATAN )  0.005 % ophthalmic solution Place 1 drop into both eyes at bedtime.  08/18/15   [provider]  levothyroxine  (SYNTHROID ) 50 MCG tablet Take 50 mcg by mouth daily. 11/02/20   [provider]  meclizine  (ANTIVERT ) 25 MG tablet Take 25-50 mg by mouth 3 (three) times daily. 09/14/20   [provider]  memantine (NAMENDA) 5 MG tablet Take 5 mg by mouth 2 (two) times daily. 12/03/20   [provider]  montelukast  (SINGULAIR ) 10 MG tablet Take 10 mg by mouth at bedtime.    [provider]  pantoprazole  (PROTONIX ) 40 MG tablet Take 40 mg by mouth daily.  08/29/15   [provider]  rOPINIRole  (REQUIP ) 2 MG tablet Take 2 mg by mouth at bedtime.  11/10/14   [provider]  traZODone (DESYREL) 50 MG tablet Take 50 mg by mouth at bedtime. 04/04/24 07/03/24  [provider]  venlafaxine  XR (EFFEXOR -XR) 150 MG 24 hr capsule Take 150 mg by mouth every morning. 09/30/20   [provider]  venlafaxine  XR (EFFEXOR -XR) 37.5 MG 24 hr capsule Take 37.5 mg by mouth daily. 04/04/24 07/03/24  [provider]  Vitamin D, Ergocalciferol, (DRISDOL) 1.25 MG (50000 UNIT) CAPS capsule Take 50,000 Units by mouth every Sunday. 09/21/20   [provider]  XARELTO  10 MG TABS tablet TAKE ONE TABLET BY MOUTH EVERY DAY WITH SUPPER 10/04/23   Ennever, Sherryll Donald, MD     Family History  Problem Relation Age of Onset   Hypertension Mother    Arthritis Mother    Breast cancer Mother    Colon cancer Mother    Colon polyps Mother    Diabetes Father    Pneumonia Father    Heart attack Father    Heart disease Father    Dementia Father    Diabetes Brother    Breast cancer Maternal Aunt    Sleep apnea Neg Hx     Social History   Socioeconomic History   Marital status: Widowed    Spouse name: Not on file   Number of children: 2   Years of education: 37   Highest education level: Not on file  Occupational History   Occupation: Retired   Tobacco Use   Smoking status: Never   Smokeless tobacco: Never  Vaping Use   Vaping status: Never Used  Substance and Sexual Activity   Alcohol use: Not Currently    Comment: 02/22/2018 "might have a drink q couple years"   Drug use: No   Sexual activity: Not Currently  Other Topics Concern   Not on file  Social History Narrative   ** Merged History Encounter **       Lives at home with her brother. Right-handed. 3-4 cups coffee per day.   Social Drivers of Health   Financial Resource Strain: Low Risk  (04/17/2024)  Received from [facility]   Overall Financial Resource Strain (CARDIA)    Difficulty of Paying Living Expenses: Not hard at all  Food Insecurity: No Food Insecurity (04/17/2024)   [facility]   Hunger Vital Sign    Worried About Running Out of Food in the Last Year: Never true    Ran Out of Food in the Last Year: Never true  Transportation Needs: No Transportation Needs (04/17/2024)   Received from [facility] - Transportation    Lack of Transportation (Medical): No    Lack of Transportation (Non-Medical): No  Physical Activity: Unknown (04/17/2024)   Received from [facility]   Exercise Vital Sign    Days of Exercise per Week: 0 days    Minutes of Exercise per Session: Not on file  Stress: Stress Concern Present (04/17/2024)   Received from [facility] - Occupational Stress Questionnaire    Feeling of Stress : To some extent  Social Connections: Socially Integrated (04/17/2024)   Received from [facility]   Social Network    How would you rate your social network (family, work, friends)?: Good participation with social networks       Review of Systems: denies fever,HA,CP,abd/back pain,N/V or bleeding; she does have dyspnea, cough  Vital Signs: Vitals:   05/15/24 0909  BP: 135/75  Pulse: (!) 101  Resp: 18  Temp: 97.7 F (36.5 C)  SpO2: 99%     Advance Care Plan: No  documents on file     Physical Exam: awake, answers simple questions ok; chest- few fine bibasilar crackles; heart- RRR; abd-soft,+BS,NT; no LE edema  Imaging: CT CHEST ABDOMEN PELVIS W CONTRAST Result Date: 05/03/2024 EXAMINATION: CT CHEST ABDOMEN PELVIS W CONTRAST CLINICAL INDICATION: Female, 78 years old. Hematologic malignancy, staging; New diagnosis of CLL. TECHNIQUE: Axial CT of the chest, abdomen, and pelvis with 100 mL Omnipaque  300 intravenous contrast. Multiplanar reformations provided. Unless otherwise specified, incidental thyroid , adrenal, renal lesions do not require dedicated imaging follow up. Additionally, any mentioned pulmonary nodules do not require dedicated imaging follow-up based on the Fleischner guidelines unless otherwise specified. Coronary calcifications are not identified unless otherwise specified. COMPARISON: 05/21/2019 FINDINGS: CHEST: The visualized thyroid  is normal. The thoracic aorta is nonaneurysmal. Scattered atherosclerotic changes are present. The main pulmonary artery is normal in caliber. The heart is normal in size. There are coronary calcifications with calcification of the aortic annulus. There is no free fluid or pathologic lymphadenopathy by size criteria. The trachea and mainstem bronchi appear patent. Subsegmental atelectatic changes are noted within the lung bases. The lungs are otherwise clear. ABDOMEN/PELVIS: The liver contains a small cyst. The gallbladder is normal. There is splenomegaly with the spleen measuring 15.0 cm in axial plane. The pancreas is normal. The adrenals are normal. The kidneys demonstrate mild cortical scarring. The abdominal aorta is normal in caliber. Scattered atherosclerotic changes are present. The urinary bladder is normal. The uterus is surgically absent. Large and small bowel loops are otherwise within normal limits. There is no free fluid or concerning lymphadenopathy. BONES: No suspicious osseous lesions are identified. There is  diffuse osseous demineralization with degenerative changes of the spine and bony pelvis. IMPRESSION: Nonspecific splenomegaly. No concerning lymphadenopathy within the chest, abdomen, or pelvis. DOSE REDUCTION: All CT scans are performed using radiation dose reduction techniques, when applicable. Technical factors are evaluated and adjusted to ensure appropriate moderation of exposure. Electronically signed by: [place] Engel MD 05/03/2024 08:11 PM  EDT RP Workstation: [id number]    Labs:  CBC: Recent Labs    05/19/23 1420 04/24/24 1323 04/29/24 1241  WBC 20.9* 38.3* 41.3*  HGB 12.6 9.4* 8.9*  HCT 38.3 30.3* 28.4*  PLT 232 243 234    COAGS: No results for input(s): "INR", "APTT" in the last 8760 hours.  BMP: Recent Labs    05/19/23 1420 04/24/24 1323  NA 136 139  K 4.3 4.0  CL 102 103  CO2 27 24  GLUCOSE 121* 123*  BUN 19 15  CALCIUM 10.1 9.8  CREATININE 0.95 0.99  GFRNONAA >60 59*    LIVER FUNCTION TESTS: Recent Labs    05/19/23 1420 04/24/24 1323  BILITOT 0.5 0.6  AST 15 13*  ALT 11 9  ALKPHOS 103 126  PROT 7.0 7.3  ALBUMIN 4.2 4.3    TUMOR MARKERS: No results for input(s): "AFPTM", "CEA", "CA199", "CHROMGRNA" in the last 8760 hours.  Assessment and Plan: Pt with hx recently diagnosed CLL Request for  image guided bone marrow biopsy and aspiration to rule out marrow involvement No contraindications for procedure identified in ROS, physical exam, or review of pre-sedation considerations. 5/7 Labs reviewed and within acceptable range. CBC to be repeated w/diff 05/15/24. 05/03/24 CT imaging available and reviewed: No suspicious osseous lesions are identified. There is diffuse osseous demineralization with degenerative changes of the spine and bony pelvis.She  was not asked to hold blood thinners for this low bleeding risk procedure.   VSS, afebrile   Risks and benefits of image guided bone marrow biopsy and aspiration was discussed with the patient /daughter  including, but not limited to bleeding, infection, damage to adjacent structures or low yield requiring additional tests.  All of the questions were answered and there is agreement to proceed.  Consent signed and in chart.   Thank you for allowing our service to participate in [name] 's care.    Electronically Signed: Terressa Fess, NP Ernestina Headland Danise Durie 05/14/2024, 4:19 PM     I spent a total of  15 minutes   in face to face in clinical consultation, greater than 50% of which was counseling/coordinating care for image guided bone marrow biopsy and aspiration.    (A copy of this note was sent to the referring provider and the time of visit.)

## 2024-05-15 ENCOUNTER — Other Ambulatory Visit: Payer: Self-pay

## 2024-05-15 ENCOUNTER — Ambulatory Visit (HOSPITAL_COMMUNITY)
Admission: RE | Admit: 2024-05-15 | Discharge: 2024-05-15 | Disposition: A | Discharge status: Home / Self Care | Source: Ambulatory Visit | Attending: Hematology & Oncology | Admitting: Hematology & Oncology

## 2024-05-15 ENCOUNTER — Encounter (HOSPITAL_COMMUNITY): Payer: Self-pay

## 2024-05-15 DIAGNOSIS — Z1379 Encounter for other screening for genetic and chromosomal anomalies: Secondary | ICD-10-CM | POA: Diagnosis not present

## 2024-05-15 DIAGNOSIS — I10 Essential (primary) hypertension: Secondary | ICD-10-CM | POA: Insufficient documentation

## 2024-05-15 DIAGNOSIS — Z86718 Personal history of other venous thrombosis and embolism: Secondary | ICD-10-CM | POA: Insufficient documentation

## 2024-05-15 DIAGNOSIS — Z86711 Personal history of pulmonary embolism: Secondary | ICD-10-CM | POA: Diagnosis not present

## 2024-05-15 DIAGNOSIS — C9110 Chronic lymphocytic leukemia of B-cell type not having achieved remission: Secondary | ICD-10-CM | POA: Insufficient documentation

## 2024-05-15 DIAGNOSIS — Z7901 Long term (current) use of anticoagulants: Secondary | ICD-10-CM | POA: Insufficient documentation

## 2024-05-15 DIAGNOSIS — E039 Hypothyroidism, unspecified: Secondary | ICD-10-CM | POA: Diagnosis not present

## 2024-05-15 DIAGNOSIS — K219 Gastro-esophageal reflux disease without esophagitis: Secondary | ICD-10-CM | POA: Diagnosis not present

## 2024-05-15 LAB — CBC WITH DIFFERENTIAL/PLATELET
Abs Immature Granulocytes: 0.13 10*3/uL — ABNORMAL HIGH (ref 0.00–0.07)
Basophils Absolute: 0.1 10*3/uL (ref 0.0–0.1)
Basophils Relative: 0 %
Eosinophils Absolute: 0.1 10*3/uL (ref 0.0–0.5)
Eosinophils Relative: 0 %
HCT: 29.3 % — ABNORMAL LOW (ref 36.0–46.0)
Hemoglobin: 8.9 g/dL — ABNORMAL LOW (ref 12.0–15.0)
Immature Granulocytes: 1 %
Lymphocytes Relative: 79 %
Lymphs Abs: 22.2 10*3/uL — ABNORMAL HIGH (ref 0.7–4.0)
MCH: 28.1 pg (ref 26.0–34.0)
MCHC: 30.4 g/dL (ref 30.0–36.0)
MCV: 92.4 fL (ref 80.0–100.0)
Monocytes Absolute: 2.8 10*3/uL — ABNORMAL HIGH (ref 0.1–1.0)
Monocytes Relative: 10 %
Neutro Abs: 2.9 10*3/uL (ref 1.7–7.7)
Neutrophils Relative %: 10 %
Platelets: 250 10*3/uL (ref 150–400)
RBC: 3.17 MIL/uL — ABNORMAL LOW (ref 3.87–5.11)
RDW: 16.9 % — ABNORMAL HIGH (ref 11.5–15.5)
WBC: 28.2 10*3/uL — ABNORMAL HIGH (ref 4.0–10.5)
nRBC: 0 % (ref 0.0–0.2)

## 2024-05-15 MED ORDER — SODIUM CHLORIDE 0.9 % IV SOLN: INTRAVENOUS | Status: DC

## 2024-05-15 MED ORDER — MIDAZOLAM HCL 2 MG/2ML IJ SOLN
INTRAMUSCULAR | Status: AC | PRN
  Administered 2024-05-15 (×2): 1 mg via INTRAVENOUS

## 2024-05-15 MED ORDER — MIDAZOLAM HCL 2 MG/2ML IJ SOLN
INTRAMUSCULAR | Status: AC
  Filled 2024-05-15: qty 2

## 2024-05-15 MED ORDER — FENTANYL CITRATE (PF) 100 MCG/2ML IJ SOLN
INTRAMUSCULAR | Status: AC | PRN
  Administered 2024-05-15 (×2): 50 ug via INTRAVENOUS

## 2024-05-15 MED ORDER — FENTANYL CITRATE (PF) 100 MCG/2ML IJ SOLN
INTRAMUSCULAR | Status: AC
Start: 2024-05-15 — End: ?
  Filled 2024-05-15: qty 2

## 2024-05-15 NOTE — Discharge Instructions (Signed)
 Please call Interventional Radiology clinic [phone] with any questions or concerns.  You may remove your dressing and shower tomorrow.   Bone Marrow Aspiration and Bone Marrow Biopsy, Adult, Care After This sheet gives you information about how to care for yourself after your procedure. Your health care provider may also give you more specific instructions. If you have problems or questions, contact your health care provider. What can I expect after the procedure? After the procedure, it is common to have: Mild pain and tenderness. Swelling. Bruising. Follow these instructions at home: Puncture site care Follow instructions from your health care provider about how to take care of the puncture site. Make sure you: Wash your hands with soap and water before and after you change your bandage (dressing). If soap and water are not available, use hand sanitizer. Change your dressing as told by your health care provider. Check your puncture site every day for signs of infection. Check for: More redness, swelling, or pain. Fluid or blood. Warmth. Pus or a bad smell.   Activity Return to your normal activities as told by your health care provider. Ask your health care provider what activities are safe for you. Do not lift anything that is heavier than 10 lb (4.5 kg), or the limit that you are told, until your health care provider says that it is safe. Do not drive for 24 hours if you were given a sedative during your procedure. General instructions Take over-the-counter and prescription medicines only as told by your health care provider. Do not take baths, swim, or use a hot tub until your health care provider approves. Ask your health care provider if you may take showers. You may only be allowed to take sponge baths. If directed, put ice on the affected area. To do this: Put ice in a plastic bag. Place a towel between your skin and the bag. Leave the ice on for 20 minutes, 2-3 times a  day. Keep all follow-up visits as told by your health care provider. This is important.   Contact a health care provider if: Your pain is not controlled with medicine. You have a fever. You have more redness, swelling, or pain around the puncture site. You have fluid or blood coming from the puncture site. Your puncture site feels warm to the touch. You have pus or a bad smell coming from the puncture site. Summary After the procedure, it is common to have mild pain, tenderness, swelling, and bruising. Follow instructions from your health care provider about how to take care of the puncture site and what activities are safe for you. Take over-the-counter and prescription medicines only as told by your health care provider. Contact a health care provider if you have any signs of infection, such as fluid or blood coming from the puncture site. This information is not intended to replace advice given to you by your health care provider. Make sure you discuss any questions you have with your health care provider. Document Revised: 04/23/2019 Document Reviewed: 04/23/2019 Elsevier Patient Education  2021 Elsevier Inc.   Moderate Conscious Sedation, Adult, Care After This sheet gives you information about how to care for yourself after your procedure. Your health care provider may also give you more specific instructions. If you have problems or questions, contact your health care provider. What can I expect after the procedure? After the procedure, it is common to have: Sleepiness for several hours. Impaired judgment for several hours. Difficulty with balance. Vomiting if you eat too  soon. Follow these instructions at home: For the time period you were told by your health care provider: Rest. Do not participate in activities where you could fall or become injured. Do not drive or use machinery. Do not drink alcohol. Do not take sleeping pills or medicines that cause drowsiness. Do not  make important decisions or sign legal documents. Do not take care of children on your own.      Eating and drinking Follow the diet recommended by your health care provider. Drink enough fluid to keep your urine pale yellow. If you vomit: Drink water, juice, or soup when you can drink without vomiting. Make sure you have little or no nausea before eating solid foods.   General instructions Take over-the-counter and prescription medicines only as told by your health care provider. Have a responsible adult stay with you for the time you are told. It is important to have someone help care for you until you are awake and alert. Do not smoke. Keep all follow-up visits as told by your health care provider. This is important. Contact a health care provider if: You are still sleepy or having trouble with balance after 24 hours. You feel light-headed. You keep feeling nauseous or you keep vomiting. You develop a rash. You have a fever. You have redness or swelling around the IV site. Get help right away if: You have trouble breathing. You have new-onset confusion at home. Summary After the procedure, it is common to feel sleepy, have impaired judgment, or feel nauseous if you eat too soon. Rest after you get home. Know the things you should not do after the procedure. Follow the diet recommended by your health care provider and drink enough fluid to keep your urine pale yellow. Get help right away if you have trouble breathing or new-onset confusion at home. This information is not intended to replace advice given to you by your health care provider. Make sure you discuss any questions you have with your health care provider. Document Revised: 04/03/2020 Document Reviewed: 10/31/2019 Elsevier Patient Education  2021 [employer].

## 2024-05-15 NOTE — Procedures (Signed)
Interventional Radiology Procedure:   Indications: CLL  Procedure: CT guided bone marrow biopsy  Findings: 2 aspirates and 1 core from right ilium  Complications: None     EBL: Minimal, less than 10 ml  Plan: Discharge to home in one hour.   Jaelyn Bourgoin R. Anselm Pancoast, MD  Pager: [phone]

## 2024-05-17 ENCOUNTER — Ambulatory Visit (HOSPITAL_COMMUNITY)

## 2024-05-17 LAB — SURGICAL PATHOLOGY

## 2024-05-21 ENCOUNTER — Encounter (HOSPITAL_COMMUNITY): Payer: Self-pay | Admitting: Hematology & Oncology

## 2024-05-22 ENCOUNTER — Encounter: Payer: Self-pay | Admitting: Hematology & Oncology

## 2024-05-22 ENCOUNTER — Inpatient Hospital Stay

## 2024-05-22 ENCOUNTER — Inpatient Hospital Stay: Attending: Hematology & Oncology

## 2024-05-22 ENCOUNTER — Inpatient Hospital Stay (HOSPITAL_BASED_OUTPATIENT_CLINIC_OR_DEPARTMENT_OTHER): Admitting: Hematology & Oncology

## 2024-05-22 ENCOUNTER — Other Ambulatory Visit: Payer: Self-pay

## 2024-05-22 VITALS — BP 116/94 | HR 84 | Temp 98.2°F | Resp 16 | Ht 62.0 in | Wt 143.0 lb

## 2024-05-22 DIAGNOSIS — C9110 Chronic lymphocytic leukemia of B-cell type not having achieved remission: Secondary | ICD-10-CM

## 2024-05-22 DIAGNOSIS — R161 Splenomegaly, not elsewhere classified: Secondary | ICD-10-CM | POA: Diagnosis not present

## 2024-05-22 DIAGNOSIS — D649 Anemia, unspecified: Secondary | ICD-10-CM | POA: Insufficient documentation

## 2024-05-22 DIAGNOSIS — R634 Abnormal weight loss: Secondary | ICD-10-CM | POA: Diagnosis not present

## 2024-05-22 DIAGNOSIS — D500 Iron deficiency anemia secondary to blood loss (chronic): Secondary | ICD-10-CM

## 2024-05-22 DIAGNOSIS — R0602 Shortness of breath: Secondary | ICD-10-CM | POA: Diagnosis not present

## 2024-05-22 DIAGNOSIS — I2699 Other pulmonary embolism without acute cor pulmonale: Secondary | ICD-10-CM | POA: Insufficient documentation

## 2024-05-22 DIAGNOSIS — Z7901 Long term (current) use of anticoagulants: Secondary | ICD-10-CM | POA: Diagnosis not present

## 2024-05-22 DIAGNOSIS — D6862 Lupus anticoagulant syndrome: Secondary | ICD-10-CM | POA: Insufficient documentation

## 2024-05-22 DIAGNOSIS — Z5112 Encounter for antineoplastic immunotherapy: Secondary | ICD-10-CM | POA: Diagnosis present

## 2024-05-22 DIAGNOSIS — Z886 Allergy status to analgesic agent status: Secondary | ICD-10-CM | POA: Diagnosis not present

## 2024-05-22 DIAGNOSIS — Z86711 Personal history of pulmonary embolism: Secondary | ICD-10-CM | POA: Diagnosis not present

## 2024-05-22 DIAGNOSIS — Z86718 Personal history of other venous thrombosis and embolism: Secondary | ICD-10-CM | POA: Insufficient documentation

## 2024-05-22 DIAGNOSIS — R945 Abnormal results of liver function studies: Secondary | ICD-10-CM | POA: Diagnosis not present

## 2024-05-22 DIAGNOSIS — Z79620 Long term (current) use of immunosuppressive biologic: Secondary | ICD-10-CM | POA: Diagnosis not present

## 2024-05-22 LAB — SAVE SMEAR(SSMR), FOR PROVIDER SLIDE REVIEW

## 2024-05-22 LAB — CBC WITH DIFFERENTIAL (CANCER CENTER ONLY)
Abs Immature Granulocytes: 0.09 10*3/uL — ABNORMAL HIGH (ref 0.00–0.07)
Basophils Absolute: 0.1 10*3/uL (ref 0.0–0.1)
Basophils Relative: 0 %
Eosinophils Absolute: 0.1 10*3/uL (ref 0.0–0.5)
Eosinophils Relative: 0 %
HCT: 24.3 % — ABNORMAL LOW (ref 36.0–46.0)
Hemoglobin: 7.5 g/dL — ABNORMAL LOW (ref 12.0–15.0)
Immature Granulocytes: 0 %
Lymphocytes Relative: 77 %
Lymphs Abs: 17.2 10*3/uL — ABNORMAL HIGH (ref 0.7–4.0)
MCH: 28.1 pg (ref 26.0–34.0)
MCHC: 30.9 g/dL (ref 30.0–36.0)
MCV: 91 fL (ref 80.0–100.0)
Monocytes Absolute: 2.3 10*3/uL — ABNORMAL HIGH (ref 0.1–1.0)
Monocytes Relative: 10 %
Neutro Abs: 2.8 10*3/uL (ref 1.7–7.7)
Neutrophils Relative %: 13 %
Platelet Count: 232 10*3/uL (ref 150–400)
RBC: 2.67 MIL/uL — ABNORMAL LOW (ref 3.87–5.11)
RDW: 17.3 % — ABNORMAL HIGH (ref 11.5–15.5)
Smear Review: NORMAL
WBC Count: 22.5 10*3/uL — ABNORMAL HIGH (ref 4.0–10.5)
nRBC: 0 % (ref 0.0–0.2)

## 2024-05-22 LAB — SAMPLE TO BLOOD BANK

## 2024-05-22 LAB — ABO/RH: ABO/RH(D): O POS

## 2024-05-22 LAB — LACTATE DEHYDROGENASE: LDH: 272 U/L — ABNORMAL HIGH (ref 98–192)

## 2024-05-22 LAB — CMP (CANCER CENTER ONLY)
ALT: 7 U/L (ref 0–44)
AST: 11 U/L — ABNORMAL LOW (ref 15–41)
Albumin: 3.8 g/dL (ref 3.5–5.0)
Alkaline Phosphatase: 119 U/L (ref 38–126)
Anion gap: 9 (ref 5–15)
BUN: 16 mg/dL (ref 8–23)
CO2: 26 mmol/L (ref 22–32)
Calcium: 9.5 mg/dL (ref 8.9–10.3)
Chloride: 103 mmol/L (ref 98–111)
Creatinine: 1.02 mg/dL — ABNORMAL HIGH (ref 0.44–1.00)
GFR, Estimated: 57 mL/min — ABNORMAL LOW (ref >60)
Glucose, Bld: 249 mg/dL — ABNORMAL HIGH (ref 70–99)
Potassium: 4.2 mmol/L (ref 3.5–5.1)
Sodium: 138 mmol/L (ref 135–145)
Total Bilirubin: 0.6 mg/dL (ref 0.0–1.2)
Total Protein: 6.6 g/dL (ref 6.5–8.1)

## 2024-05-22 LAB — RETICULOCYTES
Immature Retic Fract: 27 % — ABNORMAL HIGH (ref 2.3–15.9)
RBC.: 2.66 MIL/uL — ABNORMAL LOW (ref 3.87–5.11)
Retic Count, Absolute: 60.4 K/uL (ref 19.0–186.0)
Retic Ct Pct: 2.3 % (ref 0.4–3.1)

## 2024-05-22 LAB — FERRITIN: Ferritin: 435 ng/mL — ABNORMAL HIGH (ref 11–307)

## 2024-05-22 MED ORDER — BRUKINSA 80 MG PO CAPS
160.0000 mg | ORAL_CAPSULE | Freq: Two times a day (BID) | ORAL | 12 refills | Status: DC
  Filled 2024-05-23: qty 120, 30d supply, fill #0
  Filled 2024-06-10: qty 120, 30d supply, fill #1
  Filled 2024-07-16: qty 120, 30d supply, fill #2
  Filled 2024-08-08: qty 120, 30d supply, fill #3
  Filled 2024-09-10: qty 120, 30d supply, fill #4
  Filled 2024-10-08: qty 120, 30d supply, fill #5

## 2024-05-22 MED ORDER — FAMCICLOVIR 250 MG PO TABS: 250.0000 mg | ORAL_TABLET | Freq: Every day | ORAL | 12 refills | Status: AC

## 2024-05-22 MED ORDER — ALLOPURINOL 100 MG PO TABS
100.0000 mg | ORAL_TABLET | Freq: Every day | ORAL | 0 refills | Status: DC
Start: 2024-05-22 — End: 2024-09-24

## 2024-05-22 NOTE — Progress Notes (Signed)
 Hematology and Oncology Follow Up Visit  [name] [id number] 1946/02/28 78 y.o. 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 q day -- maintanence on 09/06/2018 EC ASA 81 mg po q day Gazyva/Brukinsa/Venetoclax -- start on 05/28/2024   Interim History:  [name] is here today for follow-up.  We definitely have a diagnosis of CLL.  She did have a bone marrow biopsy that was done.  This was done on 05/15/2024.  The pathology report ([id number]) shows CLL.  She has 7% involvement of her bone marrow.  This is kappa light chain restricted.  The problem today is that she is quite anemic.  I do not think this is hemolytic anemia.  Her corrected reticulocyte count is probably about 1%.  Work I have to clearly transfuse her..  She does have some adverse chromosomes on FISH panel.  I think were going to have to treat her.  I think she probably would be a good candidate for time-limited therapy.  I think that the protocol with Gazyva/Brukinsa/venetoclax would be a very effective protocol for her.  I think she would be able to tolerate this.  I do not see that she needs a Port-A-Cath.  She seems to have fairly good peripheral access.  I know she has other health issues.  She is on Xarelto .  She is on baby aspirin .  I would keep her on these.  She has had no fever.  We did do a CT scan on her.  She does have splenomegaly.  Surprising, there is no obvious adenopathy that was noted.   Currently, I would say that her performance status is probably ECOG 1.       Medications:  Allergies as of 05/22/2024       Reactions   Naprosyn [naproxen] Other (See Comments)   Angioedema   Naproxen Shortness Of Breath, Itching, Swelling   Tizanidine  Shortness Of Breath, Other (See Comments)    dizziness        Medication List        Accurate as of May 22, 2024  4:05 PM. If you have any questions, ask your nurse or doctor.          acetaminophen  650 MG CR tablet Commonly known as: TYLENOL  Take 650 mg by mouth every 8 (eight) hours as needed for pain.   Advair Diskus 250-50 MCG/DOSE Aepb Generic drug: fluticasone -salmeterol Inhale one puff into the lungs 2 (two) times daily.   albuterol  108 (90 Base) MCG/ACT inhaler Commonly known as: ProAir  HFA Inhale 2 puffs into the lungs every 4 (four) hours as needed for wheezing or shortness of breath.   albuterol  (2.5 MG/3ML) 0.083% nebulizer solution Commonly known as: PROVENTIL  Take 3 mLs (2.5 mg total) by nebulization every 6 (six) hours as needed for wheezing or shortness of breath.   amLODipine  10 MG tablet Commonly known as: NORVASC  Take 10 mg by mouth daily.   cetirizine 10 MG tablet Commonly known as: ZYRTEC Take 10 mg by mouth daily.   famotidine  20 MG tablet Commonly known as: PEPCID  TAKE ONE TABLET BY MOUTH AT BEDTIME What changed: additional instructions   ferrous sulfate  324 (65 Fe) MG Tbec Take 1 tablet (325 mg total) by mouth daily.   fluticasone  50 MCG/ACT nasal spray Commonly known as: FLONASE  Place  2 sprays into both nostrils daily.   latanoprost  0.005 % ophthalmic solution Commonly known as: XALATAN  Place 1 drop into both eyes at bedtime.   levothyroxine  50 MCG tablet Commonly known as: SYNTHROID  Take 50 mcg by mouth daily.   meclizine  25 MG tablet Commonly known as: ANTIVERT  Take 25-50 mg by mouth 3 (three) times daily.   memantine 5 MG tablet Commonly known as: NAMENDA Take 5 mg by mouth 2 (two) times daily.   montelukast  10 MG tablet Commonly known as: SINGULAIR  Take 10 mg by mouth at bedtime.   pantoprazole  40 MG tablet Commonly known as: PROTONIX  Take 40 mg by mouth daily.   rOPINIRole  2 MG tablet Commonly known as: REQUIP  Take 2 mg by mouth at bedtime.   traZODone  50 MG tablet Commonly known as: DESYREL Take 50 mg by mouth at bedtime.   venlafaxine  XR 150 MG 24 hr capsule Commonly known as: EFFEXOR -XR Take 150 mg by mouth every morning.   venlafaxine  XR 37.5 MG 24 hr capsule Commonly known as: EFFEXOR -XR Take 37.5 mg by mouth daily.   Vitamin D (Ergocalciferol) 1.25 MG (50000 UNIT) Caps capsule Commonly known as: DRISDOL Take 50,000 Units by mouth every Sunday.   Xarelto  10 MG Tabs tablet Generic drug: rivaroxaban  TAKE ONE TABLET BY MOUTH EVERY DAY WITH SUPPER        Allergies:  Allergies  Allergen Reactions   Naprosyn [Naproxen] Other (See Comments)    Angioedema   Naproxen Shortness Of Breath, Itching and Swelling   Tizanidine  Shortness Of Breath and Other (See Comments)    dizziness    Past Medical History, Surgical history, Social history, and Family History were reviewed and updated.  Review of Systems: Review of Systems  Constitutional:  Positive for weight loss.  HENT: Negative.    Eyes: Negative.   Respiratory:  Positive for shortness of breath.   Cardiovascular: Negative.   Gastrointestinal: Negative.   Genitourinary: Negative.   Musculoskeletal: Negative.   Skin: Negative.   Neurological: Negative.   Endo/Heme/Allergies: Negative.   Psychiatric/Behavioral: Negative.       Physical Exam:  height is 5\' 2"  (1.575 m) and weight is 143 lb (64.9 kg). Her oral temperature is 98.2 F (36.8 C). Her blood pressure is 116/94 (abnormal) and her pulse is 84. Her respiration is 16 and oxygen  saturation is 96%.   Wt Readings from Last 3 Encounters:  05/22/24 143 lb (64.9 kg)  05/15/24 144 lb 1.3 oz (65.4 kg)  04/24/24 144 lb 1.3 oz (65.4 kg)    Physical Exam Vitals reviewed.  HENT:     Head: Normocephalic and atraumatic.  Eyes:     Pupils: Pupils are equal, round, and reactive to light.  Cardiovascular:     Rate and Rhythm: Normal rate and regular rhythm.     Heart sounds: Normal heart sounds.  Pulmonary:      Effort: Pulmonary effort is normal.     Breath sounds: Normal breath sounds.  Abdominal:     General: Bowel sounds are normal.     Palpations: Abdomen is soft.  Musculoskeletal:        General: No tenderness or deformity. Normal range of motion.     Cervical back: Normal range of motion.  Lymphadenopathy:     Cervical: No cervical adenopathy.  Skin:    General: Skin is warm and dry.     Findings: No erythema or rash.  Neurological:     Mental Status: She is alert and oriented  to person, place, and time.  Psychiatric:        Behavior: Behavior normal.        Thought Content: Thought content normal.        Judgment: Judgment normal.      Lab Results  Component Value Date   WBC 22.5 (H) 05/22/2024   HGB 7.5 (L) 05/22/2024   HCT 24.3 (L) 05/22/2024   MCV 91.0 05/22/2024   PLT 232 05/22/2024   Lab Results  Component Value Date   FERRITIN 124 09/22/2021   IRON 60 09/22/2021   TIBC 249 09/22/2021   UIBC 188 09/22/2021   IRONPCTSAT 24 09/22/2021   Lab Results  Component Value Date   RETICCTPCT 2.3 05/22/2024   RBC 2.66 (L) 05/22/2024   No results found for: "KPAFRELGTCHN", "LAMBDASER", "KAPLAMBRATIO" No results found for: "IGGSERUM", "IGA", "IGMSERUM" No results found for: "TOTALPROTELP", "ALBUMINELP", "A1GS", "A2GS", "BETS", "BETA2SER", "GAMS", "MSPIKE", "SPEI"   Chemistry      Component Value Date/Time   NA 138 05/22/2024 1404   NA 141 11/03/2015 1117   K 4.2 05/22/2024 1404   CL 103 05/22/2024 1404   CO2 26 05/22/2024 1404   BUN 16 05/22/2024 1404   BUN 22 11/03/2015 1117   CREATININE 1.02 (H) 05/22/2024 1404      Component Value Date/Time   CALCIUM 9.5 05/22/2024 1404   ALKPHOS 119 05/22/2024 1404   AST 11 (L) 05/22/2024 1404   ALT 7 05/22/2024 1404   BILITOT 0.6 05/22/2024 1404       Impression and Plan: Ms. Garraway is a very pleasant 78 yo caucasian female with history of pulmonary emboli and lower extremity DVT. She had a transiently positive lupus  anticoagulant.  The last time that we saw her, there was no lupus anticoagulant.  Now, she has CLL.  She, surprising, has a decreasing white blood cell count.  However, I just worry about the adverse chromosomes that were noted.  She is anemic.  This is not hemolysis.  I think this is just secondary to marrow involvement by CLL.  We will go ahead and give her 2 units of blood tomorrow.  I think this will make her feel a lot better.  We will try to get her chemotherapy set up for next week.  I would start her out on Gazyva.  I will also get her on Brukinsa.  I will hold off on the venetoclax until we have good clearance of the CLL and her bone marrow.  She will clearly need to be on allopurinol.  She will also need to be on Famvir.  I had a long talk with she and her daughter.  I went over the treatment protocol.  I explained the side effects.  I really think that she should do okay.  We will try to go ahead and get her started next week.  I will plan to see her back probably in about 3 to 4 weeks.  We will follow-up on her blood counts.   Ivor Mars, MD 6/4/20254:05 PM

## 2024-05-22 NOTE — Progress Notes (Signed)
 START OFF PATHWAY REGIMEN - Lymphoma and CLL   [id number]:Obinutuzumab 1,000 IV D1,8,15/D1 (Cycles 1-8) + Venetoclax 400 mg PO Daily (with ramp-up) (Cycles 3-24) + Zanubrutinib 160 mg PO BID D1-28 (Cycles 1-24) q28 Days:   Cycle 1: A cycle is 28 days:     Zanubrutinib      Obinutuzumab    Cycle 2: A cycle is 28 days:     Zanubrutinib      Obinutuzumab    Cycle 3: A cycle is 28 days:     Venetoclax      Venetoclax      Venetoclax      Venetoclax      Zanubrutinib      Obinutuzumab    Cycles 4 through 8: A cycle is every 28 days:     Venetoclax      Zanubrutinib      Obinutuzumab    Cycles 9 through 24: A cycle is every 28 days:     Venetoclax      Zanubrutinib   **Always confirm dose/schedule in your pharmacy ordering system**  Patient Characteristics: Chronic Lymphocytic Leukemia (CLL), Treatment Indicated, First Line Disease Type: Chronic Lymphocytic Leukemia (CLL) Disease Type: Not Applicable Disease Type: Not Applicable Treatment Indicated<= Treatment Indicated Line of Therapy: First Line Intent of Therapy: Curative Intent, Discussed with Patient

## 2024-05-23 ENCOUNTER — Encounter (HOSPITAL_COMMUNITY): Payer: Self-pay

## 2024-05-23 ENCOUNTER — Encounter: Payer: Self-pay | Admitting: Hematology & Oncology

## 2024-05-23 ENCOUNTER — Telehealth: Payer: Self-pay | Admitting: Pharmacy Technician

## 2024-05-23 ENCOUNTER — Other Ambulatory Visit: Payer: Self-pay

## 2024-05-23 ENCOUNTER — Telehealth: Payer: Self-pay

## 2024-05-23 ENCOUNTER — Other Ambulatory Visit: Payer: Self-pay | Admitting: Pharmacy Technician

## 2024-05-23 ENCOUNTER — Inpatient Hospital Stay

## 2024-05-23 ENCOUNTER — Other Ambulatory Visit (HOSPITAL_COMMUNITY): Payer: Self-pay

## 2024-05-23 DIAGNOSIS — D500 Iron deficiency anemia secondary to blood loss (chronic): Secondary | ICD-10-CM | POA: Insufficient documentation

## 2024-05-23 DIAGNOSIS — C9110 Chronic lymphocytic leukemia of B-cell type not having achieved remission: Secondary | ICD-10-CM

## 2024-05-23 DIAGNOSIS — Z5112 Encounter for antineoplastic immunotherapy: Secondary | ICD-10-CM | POA: Diagnosis not present

## 2024-05-23 HISTORY — DX: Iron deficiency anemia secondary to blood loss (chronic): D50.0

## 2024-05-23 LAB — KAPPA/LAMBDA LIGHT CHAINS
Kappa free light chain: 111.9 mg/L — ABNORMAL HIGH (ref 3.3–19.4)
Kappa, lambda light chain ratio: 6.05 — ABNORMAL HIGH (ref 0.26–1.65)
Lambda free light chains: 18.5 mg/L (ref 5.7–26.3)

## 2024-05-23 LAB — BETA 2 MICROGLOBULIN, SERUM: Beta-2 Microglobulin: 4.1 mg/L — ABNORMAL HIGH (ref 0.6–2.4)

## 2024-05-23 LAB — PREPARE RBC (CROSSMATCH)

## 2024-05-23 LAB — IRON AND IRON BINDING CAPACITY (CC-WL,HP ONLY)
Iron: 30 ug/dL (ref 28–170)
Saturation Ratios: 13 % (ref 10.4–31.8)
TIBC: 234 ug/dL — ABNORMAL LOW (ref 250–450)
UIBC: 204 ug/dL (ref 148–442)

## 2024-05-23 LAB — IGG, IGA, IGM
IgA: 107 mg/dL (ref 64–422)
IgG (Immunoglobin G), Serum: 741 mg/dL (ref 586–1602)
IgM (Immunoglobulin M), Srm: 255 mg/dL — ABNORMAL HIGH (ref 26–217)

## 2024-05-23 MED ORDER — ACETAMINOPHEN 325 MG PO TABS
650.0000 mg | ORAL_TABLET | Freq: Once | ORAL | Status: AC
  Administered 2024-05-23: 650 mg via ORAL
  Filled 2024-05-23: qty 2

## 2024-05-23 MED ORDER — SODIUM CHLORIDE 0.9% IV SOLUTION
250.0000 mL | INTRAVENOUS | Status: DC
  Administered 2024-05-23: 100 mL via INTRAVENOUS

## 2024-05-23 MED ORDER — DIPHENHYDRAMINE HCL 25 MG PO CAPS
25.0000 mg | ORAL_CAPSULE | Freq: Once | ORAL | Status: AC
  Administered 2024-05-23: 25 mg via ORAL
  Filled 2024-05-23: qty 1

## 2024-05-23 NOTE — Telephone Encounter (Signed)
 Oral Oncology Patient Advocate Encounter  Was successful in securing patient a $8000 grant from [employer] to provide copayment coverage for [employer].  This will keep the out of pocket expense at $0.     [employer] ID: [id number]   The billing information is as follows and has been shared with [facility].    RxBin: [id number] PCN: [id number] Member ID: [id number] Group ID: [id number] Dates of Eligibility: 04/23/2024 through 04/22/2024  Fund:  Chronic Lymphocytic Leukemia  Jeffery Gammell ([name]) [name], CPhT  [facility], [place], [place], [place] Oral Chemotherapy Patient Advocate Phone: [phone]  Fax: [phone]

## 2024-05-23 NOTE — Telephone Encounter (Signed)
 Oral Oncology Patient Advocate Encounter  Prior Authorization for Brukinsa has been approved.    PA# [id number] Effective dates: 12/20/2023 through 12/18/2024  Patients co-pay is $667.69.    Thersa Mohiuddin ([name]) [name], CPhT  [facility], [place], [place], [place] Oral Chemotherapy Patient Advocate Phone: [phone]  Fax: [phone]

## 2024-05-23 NOTE — Addendum Note (Signed)
 Addended by: Gray Layman R on: 05/23/2024 01:36 PM   Modules accepted: Orders

## 2024-05-23 NOTE — Progress Notes (Signed)
 Specialty Pharmacy Initial Fill Coordination Note  [name] is a 78 y.o. female contacted today regarding refills of specialty medication(s) Zanubrutinib (Brukinsa) .  Patient requested Delivery  on 05/27/24  to verified address [address] [facility] [facility] [place] [address]   Medication will be filled on 05/24/2024.   Patient is aware of $0 copayment. Radio producer.  Arella Blinder ([name]) [name], CPhT  [facility], [place], [place], [place] Oral Chemotherapy Patient Advocate Phone: [phone]  Fax: [phone]

## 2024-05-23 NOTE — Progress Notes (Signed)
 Patient counseled on Brukinsa in telephone encounter opened on 05/23/24.  Robyn Galati, PharmD Hematology/Oncology Clinical Pharmacist [facility] [phone]

## 2024-05-23 NOTE — Telephone Encounter (Signed)
 Oral Chemotherapy Pharmacist Encounter  I spoke with patient for overview of: Brukinsa (zanubrutinib) for the treatment of CLL in combination with obinutuzumab and in the future venetoclax (usually started at cycle 3), planned duration until disease progression or unacceptable toxicity.   Counseled patient on administration, dosing, side effects, monitoring, drug-food interactions, safe handling, storage, and disposal.  Patient will take Brukinsa 80mg  capsules, 2 capsules (160mg ) by mouth every 12 hours. Patient knows to avoid grapefruit or grapefruit juice while on therapy with Brukinsa.  Brukinsa and Gazyva start date: 05/28/2024  Adverse effects include but are not limited to: bruising, decreased blood counts, rash, diarrhea, fatigue, musculoskeletal pain/arthralgias, peripheral edema, change in kidney function (increase of serum creatinine), hyperglycemia, and hemorrhage. Small risk of atrial fibrillation also discussed with patient. Patient will obtain anti diarrheal and alert the office of 4 or more loose stools above baseline.  Reviewed with patient importance of keeping a medication schedule and plan for any missed doses. No barriers to medication adherence identified.  Medication reconciliation performed and medication/allergy  list updated. All questions answered. Patient voiced understanding and appreciation.   Medication education handout placed in mail for patient. Patient knows to call the office with questions or concerns. Oral Chemotherapy Clinic phone number provided to patient.   Cloe Sockwell, PharmD Hematology/Oncology Clinical Pharmacist [facility] Oral Chemotherapy Navigation Clinic [phone] 05/23/2024   10:06 AM

## 2024-05-23 NOTE — Patient Instructions (Signed)
 Blood Transfusion, Adult A blood transfusion is a procedure in which you receive blood or a type of blood cell (blood component) through an IV. You may need a blood transfusion when you have a low blood count, which is a low number of any blood cell. This may result from a bleeding disorder, illness, injury, or surgery. The blood may come from a donor, or you may be able to have your own blood collected and stored (autologous blood donation) before a planned surgery. The blood given in a transfusion may be made up of different blood components. You may receive: Red blood cells. These carry oxygen to the cells in the body. Platelets. These help your blood to clot. Plasma. This is the liquid part of your blood. It carries proteins and other substances throughout the body. White blood cells. These help you fight infections. If you have hemophilia or another clotting disorder, you may also receive other types of blood products. Depending on the type of blood product, this procedure may take 1-4 hours to complete. Tell a health care provider about: Any bleeding problems you have. Any previous reactions you have had during a blood transfusion. Any allergies you have. All medicines you are taking, including vitamins, herbs, eye drops, creams, and over-the-counter medicines. Any surgeries you have had. Any medical conditions you have. Whether you are pregnant or may be pregnant. What are the risks? Talk with your health care provider about risks. The most common problems include: A mild allergic reaction, such as red, swollen areas of skin (hives) and itching. Fever or chills. This may be the body's response to new blood cells received. This may occur during or up to 4 hours after the transfusion. More serious problems may include: A serious allergic reaction that causes difficulty breathing or swelling around the face and lips. Transfusion-associated circulatory overload (TACO), or too much fluid in  the lungs. This may cause breathing problems. Transfusion-related acute lung injury (TRALI), which causes breathing difficulty and low oxygen in the blood. This can occur within hours of the transfusion or several days later. Iron overload. This can happen after receiving many blood transfusions over a period of time. Infection or virus being transmitted. This is rare because donated blood is carefully tested before it is given. Hemolytic transfusion reaction. This is rare. It happens when the body's defense system (immune system)tries to attack the new blood cells. Symptoms may include fever, chills, nausea, low blood pressure, and low back or chest pain. Transfusion-associated graft-versus-host disease (TAGVHD). This is rare. It happens when donated cells attack the body's healthy tissues. What happens before the procedure? You will have a blood test to check your blood type. This test is done to know what kind of blood your body will accept and to match it to the donor blood. If you are going to have a planned surgery, you may be able to do an autologous blood donation. This may be done in case you need to have a transfusion. You will have your temperature, blood pressure, and pulse checked before the transfusion. If you have had an allergic reaction to a transfusion in the past, you may be given medicine to help prevent a reaction. This medicine may be given to you by mouth (orally) or through an IV. What happens during the procedure?  An IV will be inserted into one of your veins. The bag of blood will be attached to your IV. The blood will then enter through your vein. Your temperature, blood pressure,  and pulse will be monitored during the transfusion. This monitoring is done to detect early signs of a transfusion reaction. Tell your nurse right away if you have any of these symptoms during the transfusion: Shortness of breath or trouble breathing. Chest or back pain. Fever or  chills. Itching or hives. If you have any signs or symptoms of a reaction, your transfusion will be stopped and you may be given medicine. When the transfusion is complete, your IV will be removed. Pressure may be applied to the IV site for a few minutes. A bandage (dressing)will be applied. The procedure may vary among health care providers and hospitals. What happens after the procedure? Your temperature, blood pressure, pulse, breathing rate, and blood oxygen level will be monitored until you leave the hospital or clinic. Your blood may be tested to see how you have responded to the transfusion. You may be warmed with fluids or blankets to maintain a normal body temperature. If you receive your blood transfusion in an outpatient setting, you will be told whom to contact to report any reactions. Where to find more information Visit the American Red Cross: redcross.org Summary A blood transfusion is a procedure in which you receive blood or a type of blood cell (blood component) through an IV. The blood given in a transfusion may be made up of different blood components. You may receive red blood cells, platelets, plasma, or white blood cells depending on the condition treated. Your temperature, blood pressure, and pulse will be monitored before, during, and after the transfusion. After the transfusion, your blood may be tested to see how your body has responded. This information is not intended to replace advice given to you by your health care provider. Make sure you discuss any questions you have with your health care provider. Document Revised: 03/04/2022 Document Reviewed: 03/04/2022 Elsevier Patient Education  2024 [employer].

## 2024-05-23 NOTE — Addendum Note (Signed)
 Addended by: Ivor Mars on: 05/23/2024 02:36 PM   Modules accepted: Orders

## 2024-05-23 NOTE — Telephone Encounter (Signed)
 Oral Oncology Patient Advocate Encounter  Prior Authorization for Venclexta has been approved.    PA# [id number] Effective dates: 12/20/2023 through 12/18/2024  Patients co-pay is $667.69.    [name] ([name]) [name], CPhT  [facility], [place], [place], [place] Oral Chemotherapy Patient Advocate Phone: [phone]  Fax: [phone]

## 2024-05-23 NOTE — Telephone Encounter (Signed)
 Oral Oncology Patient Advocate Encounter   Received notification that prior authorization for Brukinsa is required.   PA submitted on 05/23/2024 Key [id number] Status is pending     [name] ([name]) [name], CPhT  [facility], [place], [place], [place] Oral Chemotherapy Patient Advocate Phone: [phone]  Fax: [phone]

## 2024-05-23 NOTE — Telephone Encounter (Signed)
 Patient successfully OnBoarded and drug education provided by pharmacist. Medication scheduled to be shipped on 06/06 for delivery on 06/09 from [facility] to patient's address. Patient also knows to call me at [phone] with any questions or concerns regarding receiving medication or if there is any unexpected change in co-pay.   Manual Navarra ([name]) [name], CPhT  [facility], [place], [place], [place] Oral Chemotherapy Patient Advocate Phone: [phone]  Fax: [phone]

## 2024-05-23 NOTE — Telephone Encounter (Signed)
 Oral Oncology Patient Advocate Encounter   Received notification that prior authorization for Venclexta is required.   PA submitted on 05/23/2024 Key [id number] Status is pending     [name] ([name]) [name], CPhT  [facility], [place], [place], [place] Oral Chemotherapy Patient Advocate Phone: [phone]  Fax: [phone]

## 2024-05-23 NOTE — Telephone Encounter (Signed)
 Oral Oncology Pharmacist Encounter  Received new prescription for Brukinsa (zanubrutinib) for the treatment of newly diagnosed  in conjunction with obinutuzumab and in the future the addition of venetoclax, planned duration until disease progression or unacceptable toxicity. 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 Venetoclax would not start until cycle 3 of the regimen. Prescription for venetoclax has not been sent in but prior authorization will be submitted for future.   Labs from 05/22/2024 (CBC, CMP) assessed, no interventions needed for Brukinsa. Prescription dose and frequency assessed for appropriateness.   Current medication list in Epic reviewed, DDIs with Brukinsa identified: - xarelto , trazodone, venlafaxine , and aspirin  (cat C): zanubrutinib may increase antiplatelet effects of therapeutic antiplatelet agents and aticoagulants. Will notify MD of additional monitoring needed for any bleeding.  Evaluated chart and no patient barriers to medication adherence noted.   Prescription has been e-scribed to the [facility] for benefits analysis and approval.  Oral Oncology Clinic will continue to follow for insurance authorization, copayment issues, initial counseling and start date.  Chaynce Schafer, PharmD Hematology/Oncology Clinical Pharmacist [facility] Oral [facility]otherapy [facility] [phone] 05/23/2024 8:58 AM

## 2024-05-24 ENCOUNTER — Other Ambulatory Visit: Payer: Self-pay

## 2024-05-24 LAB — BPAM RBC
Blood Product Expiration Date: 202507072359
Blood Product Expiration Date: 202507072359
ISSUE DATE / TIME: 202506050859
ISSUE DATE / TIME: 202506050859
Unit Type and Rh: 5100
Unit Type and Rh: 5100

## 2024-05-24 LAB — TYPE AND SCREEN
ABO/RH(D): O POS
Antibody Screen: NEGATIVE
Unit division: 0
Unit division: 0

## 2024-05-27 ENCOUNTER — Other Ambulatory Visit: Payer: Self-pay | Admitting: *Deleted

## 2024-05-27 DIAGNOSIS — C9110 Chronic lymphocytic leukemia of B-cell type not having achieved remission: Secondary | ICD-10-CM

## 2024-05-27 MED ORDER — ONDANSETRON HCL 8 MG PO TABS: 8.0000 mg | ORAL_TABLET | Freq: Three times a day (TID) | ORAL | 1 refills | Status: DC | PRN

## 2024-05-27 MED ORDER — PROCHLORPERAZINE MALEATE 10 MG PO TABS: 10.0000 mg | ORAL_TABLET | Freq: Four times a day (QID) | ORAL | 1 refills | Status: DC | PRN

## 2024-05-28 ENCOUNTER — Inpatient Hospital Stay

## 2024-05-28 VITALS — BP 156/63 | HR 100 | Temp 97.5°F | Resp 16

## 2024-05-28 DIAGNOSIS — C9110 Chronic lymphocytic leukemia of B-cell type not having achieved remission: Secondary | ICD-10-CM

## 2024-05-28 DIAGNOSIS — Z5112 Encounter for antineoplastic immunotherapy: Secondary | ICD-10-CM | POA: Diagnosis not present

## 2024-05-28 LAB — CBC WITH DIFFERENTIAL (CANCER CENTER ONLY)
Abs Immature Granulocytes: 0.04 10*3/uL (ref 0.00–0.07)
Basophils Absolute: 0.1 10*3/uL (ref 0.0–0.1)
Basophils Relative: 0 %
Eosinophils Absolute: 0.1 10*3/uL (ref 0.0–0.5)
Eosinophils Relative: 0 %
HCT: 35 % — ABNORMAL LOW (ref 36.0–46.0)
Hemoglobin: 11.1 g/dL — ABNORMAL LOW (ref 12.0–15.0)
Immature Granulocytes: 0 %
Lymphocytes Relative: 81 %
Lymphs Abs: 18.5 10*3/uL — ABNORMAL HIGH (ref 0.7–4.0)
MCH: 29.2 pg (ref 26.0–34.0)
MCHC: 31.7 g/dL (ref 30.0–36.0)
MCV: 92.1 fL (ref 80.0–100.0)
Monocytes Absolute: 1.6 10*3/uL — ABNORMAL HIGH (ref 0.1–1.0)
Monocytes Relative: 7 %
Neutro Abs: 2.7 10*3/uL (ref 1.7–7.7)
Neutrophils Relative %: 12 %
Platelet Count: 214 10*3/uL (ref 150–400)
RBC: 3.8 MIL/uL — ABNORMAL LOW (ref 3.87–5.11)
RDW: 16.4 % — ABNORMAL HIGH (ref 11.5–15.5)
Smear Review: NORMAL
WBC Count: 22.9 10*3/uL — ABNORMAL HIGH (ref 4.0–10.5)
nRBC: 0 % (ref 0.0–0.2)

## 2024-05-28 LAB — CMP (CANCER CENTER ONLY)
ALT: 7 U/L (ref 0–44)
AST: 10 U/L — ABNORMAL LOW (ref 15–41)
Albumin: 3.9 g/dL (ref 3.5–5.0)
Alkaline Phosphatase: 118 U/L (ref 38–126)
Anion gap: 9 (ref 5–15)
BUN: 20 mg/dL (ref 8–23)
CO2: 26 mmol/L (ref 22–32)
Calcium: 9.5 mg/dL (ref 8.9–10.3)
Chloride: 105 mmol/L (ref 98–111)
Creatinine: 1.1 mg/dL — ABNORMAL HIGH (ref 0.44–1.00)
GFR, Estimated: 52 mL/min — ABNORMAL LOW (ref >60)
Glucose, Bld: 135 mg/dL — ABNORMAL HIGH (ref 70–99)
Potassium: 4.2 mmol/L (ref 3.5–5.1)
Sodium: 140 mmol/L (ref 135–145)
Total Bilirubin: 0.5 mg/dL (ref 0.0–1.2)
Total Protein: 6.7 g/dL (ref 6.5–8.1)

## 2024-05-28 LAB — HEPATITIS B SURFACE ANTIGEN: Hepatitis B Surface Ag: NONREACTIVE

## 2024-05-28 MED ORDER — ACETAMINOPHEN 325 MG PO TABS
650.0000 mg | ORAL_TABLET | Freq: Once | ORAL | Status: AC
  Administered 2024-05-28: 650 mg via ORAL
  Filled 2024-05-28: qty 2

## 2024-05-28 MED ORDER — SODIUM CHLORIDE 0.9 % IV SOLN
20.0000 mg | Freq: Once | INTRAVENOUS | Status: AC
  Administered 2024-05-28: 20 mg via INTRAVENOUS
  Filled 2024-05-28: qty 2

## 2024-05-28 MED ORDER — DIPHENHYDRAMINE HCL 50 MG/ML IJ SOLN
50.0000 mg | Freq: Once | INTRAMUSCULAR | Status: AC
  Administered 2024-05-28: 50 mg via INTRAVENOUS
  Filled 2024-05-28: qty 1

## 2024-05-28 MED ORDER — SODIUM CHLORIDE 0.9 % IV SOLN
100.0000 mg | Freq: Once | INTRAVENOUS | Status: AC
  Administered 2024-05-28: 100 mg via INTRAVENOUS
  Filled 2024-05-28: qty 4

## 2024-05-28 MED ORDER — SODIUM CHLORIDE 0.9 % IV SOLN: INTRAVENOUS | Status: DC

## 2024-05-28 MED ORDER — LORAZEPAM 2 MG/ML IJ SOLN
0.5000 mg | Freq: Once | INTRAMUSCULAR | Status: AC
  Administered 2024-05-28: 0.5 mg via INTRAVENOUS
  Filled 2024-05-28: qty 1

## 2024-05-28 NOTE — Progress Notes (Signed)
 Ok to use 6.10.25 labs to begin todays treatment per dr Maria Shiner  Patient has restless legs syndrome.  Seems to be worsened after the Benadryl .  Benadryl  order changed to Cetirizine IV per pharmacy per dr Maria Shiner order.

## 2024-05-28 NOTE — Progress Notes (Addendum)
 Ok to treat with CMET, Hep B labs, and ANC pending.  Jobe Mulder [place], [place], BCPS, BCOP 05/28/2024 8:23 AM

## 2024-05-28 NOTE — Patient Instructions (Signed)
Obinutuzumab Injection What is this medication? OBINUTUZUMAB (OH bi nue TOOZ ue mab) treats leukemia and lymphoma. It works by blocking a protein that causes cancer cells to grow and multiply. This helps to slow or stop the spread of cancer cells. It is a monoclonal antibody. This medicine may be used for other purposes; ask your health care provider or pharmacist if you have questions. COMMON BRAND NAME(S): GAZYVA What should I tell my care team before I take this medication? They need to know if you have any of these conditions: Heart disease Infection, especially a viral infection, such as hepatitis B Lung or breathing disease Take medications that treat or prevent blood clots An unusual or allergic reaction to obinutuzumab, other medications, foods, dyes, or preservatives Pregnant or trying to get pregnant Breastfeeding How should I use this medication? This medication is for infusion into a vein. It is given by a care team in a hospital or clinic setting. Talk to your care team about the use of this medication in children. Special care may be needed. Overdosage: If you think you have taken too much of this medicine contact a poison control center or emergency room at once. NOTE: This medicine is only for you. Do not share this medicine with others. What if I miss a dose? Keep appointments for follow-up doses as directed. It is important not to miss your dose. Call your care team if you are unable to keep an appointment. What may interact with this medication? Live virus vaccines This list may not describe all possible interactions. Give your health care provider a list of all the medicines, herbs, non-prescription drugs, or dietary supplements you use. Also tell them if you smoke, drink alcohol, or use illegal drugs. Some items may interact with your medicine. What should I watch for while using this medication? Report any side effects that you notice during your treatment right away,  such as changes in your breathing, fever, chills, dizziness or lightheadedness. These effects are more common with the first dose. Visit your care team for checks on your progress. You will need to have regular blood work. Report any other side effects. The side effects of this medication can continue after you finish your treatment. Continue your course of treatment even though you feel ill unless your care team tells you to stop. Call your care team for advice if you get a fever, chills or sore throat, or other symptoms of a cold or flu. Do not treat yourself. This medication decreases your body's ability to fight infections. Try to avoid being around people who are sick. This medication may increase your risk to bruise or bleed. Call your care team if you notice any unusual bleeding. Do not become pregnant while taking this medication or for 6 months after stopping it. Inform your care team if you wish to become pregnant or think you might be pregnant. There is a potential for serious side effects to an unborn child. Talk to your care team or pharmacist for more information. Do not breast-feed an infant while taking this medication or for 6 months after stopping it. What side effects may I notice from receiving this medication? Side effects that you should report to your care team as soon as possible: Allergic reactions--skin rash, itching, hives, swelling of the face, lips, tongue, or throat Bleeding--bloody or black, tar-like stools, vomiting blood or brown material that looks like coffee grounds, red or dark brown urine, small red or purple spots on skin, unusual bruising  or bleeding Blood clot--pain, swelling, or warmth in the leg, shortness of breath, chest pain Dizziness, loss of balance or coordination, confusion or trouble speaking Infection--fever, chills, cough, sore throat, wounds that don't heal, pain or trouble when passing urine, general feeling of discomfort or being unwell Infusion  reactions--chest pain, shortness of breath or trouble breathing, feeling faint or lightheaded Liver injury--right upper belly pain, loss of appetite, nausea, light-colored stool, dark yellow or brown urine, yellowing skin or eyes, unusual weakness or fatigue Tumor lysis syndrome (TLS)--nausea, vomiting, diarrhea, decrease in the amount of urine, dark urine, unusual weakness or fatigue, confusion, muscle pain or cramps, fast or irregular heartbeat, joint pain Side effects that usually do not require medical attention (report to your care team if they continue or are bothersome): Bone, joint, or muscle pain Constipation Diarrhea Fatigue Runny or stuffy nose Sore throat This list may not describe all possible side effects. Call your doctor for medical advice about side effects. You may report side effects to FDA at 1-800-FDA-1088. Where should I keep my medication? This medication is only given in a hospital or clinic and will not be stored at home. NOTE: This sheet is a summary. It may not cover all possible information. If you have questions about this medicine, talk to your doctor, pharmacist, or health care provider.  2024 Elsevier/Gold Standard (2022-04-27 00:00:00)

## 2024-05-29 ENCOUNTER — Inpatient Hospital Stay

## 2024-05-29 VITALS — BP 144/95 | HR 99 | Temp 98.1°F | Resp 18

## 2024-05-29 DIAGNOSIS — C9110 Chronic lymphocytic leukemia of B-cell type not having achieved remission: Secondary | ICD-10-CM

## 2024-05-29 DIAGNOSIS — Z5112 Encounter for antineoplastic immunotherapy: Secondary | ICD-10-CM | POA: Diagnosis not present

## 2024-05-29 MED ORDER — CETIRIZINE HCL 10 MG/ML IV SOLN
10.0000 mg | Freq: Once | INTRAVENOUS | Status: AC
  Administered 2024-05-29: 10 mg via INTRAVENOUS
  Filled 2024-05-29: qty 1

## 2024-05-29 MED ORDER — SODIUM CHLORIDE 0.9 % IV SOLN
20.0000 mg | Freq: Once | INTRAVENOUS | Status: DC
  Filled 2024-05-29: qty 2

## 2024-05-29 MED ORDER — SODIUM CHLORIDE 0.9 % IV SOLN: INTRAVENOUS | Status: DC

## 2024-05-29 MED ORDER — ACETAMINOPHEN 325 MG PO TABS
650.0000 mg | ORAL_TABLET | Freq: Once | ORAL | Status: AC
  Administered 2024-05-29: 650 mg via ORAL
  Filled 2024-05-29: qty 2

## 2024-05-29 MED ORDER — SODIUM CHLORIDE 0.9 % IV SOLN
900.0000 mg | Freq: Once | INTRAVENOUS | Status: AC
  Administered 2024-05-29: 900 mg via INTRAVENOUS
  Filled 2024-05-29: qty 36

## 2024-05-29 MED ORDER — DEXAMETHASONE SODIUM PHOSPHATE 10 MG/ML IJ SOLN
10.0000 mg | Freq: Once | INTRAMUSCULAR | Status: AC
  Administered 2024-05-29: 10 mg via INTRAVENOUS
  Filled 2024-05-29: qty 1

## 2024-05-29 NOTE — Patient Instructions (Signed)
 [facility] [place] [facility]  Discharge Instructions: Thank you for choosing [facility] to provide your oncology and hematology care.   If you have a lab appointment with the Cancer Center, please go directly to the Cancer Center and check in at the registration area.  Wear comfortable clothing and clothing appropriate for easy access to any Portacath or PICC line.   We strive to give you quality time with your provider. You may need to reschedule your appointment if you arrive late (15 or more minutes).  Arriving late affects you and other patients whose appointments are after yours.  Also, if you miss three or more appointments without notifying the office, you may be dismissed from the clinic at the provider's discretion.      For prescription refill requests, have your pharmacy contact our office and allow 72 hours for refills to be completed.    Today you received the following chemotherapy and/or immunotherapy agents Enjaymo      To help prevent nausea and vomiting after your treatment, we encourage you to take your nausea medication as directed.  BELOW ARE SYMPTOMS THAT SHOULD BE REPORTED IMMEDIATELY: *FEVER GREATER THAN 100.4 F (38 C) OR HIGHER *CHILLS OR SWEATING *NAUSEA AND VOMITING THAT IS NOT CONTROLLED WITH YOUR NAUSEA MEDICATION *UNUSUAL SHORTNESS OF BREATH *UNUSUAL BRUISING OR BLEEDING *URINARY PROBLEMS (pain or burning when urinating, or frequent urination) *BOWEL PROBLEMS (unusual diarrhea, constipation, pain near the anus) TENDERNESS IN MOUTH AND THROAT WITH OR WITHOUT PRESENCE OF ULCERS (sore throat, sores in mouth, or a toothache) UNUSUAL RASH, SWELLING OR PAIN  UNUSUAL VAGINAL DISCHARGE OR ITCHING   Items with * indicate a potential emergency and should be followed up as soon as possible or go to the Emergency Department if any problems should occur.  Please show the CHEMOTHERAPY ALERT CARD or IMMUNOTHERAPY  ALERT CARD at check-in to the Emergency Department and triage nurse. Should you have questions after your visit or need to cancel or reschedule your appointment, please contact [facility] [place] - A DEPT OF [facility]  [phone] and follow the prompts.  Office hours are 8:00 a.m. to 4:30 p.m. Monday - Friday. Please note that voicemails left after 4:00 p.m. may not be returned until the following business day.  We are closed weekends and major holidays. You have access to a nurse at all times for urgent questions. Please call the main number to the clinic [phone] and follow the prompts.  For any non-urgent questions, you may also contact your provider using MyChart. We now offer e-Visits for anyone 59 and older to request care online for non-urgent symptoms. For details visit [email or web address].   Also download the MyChart app! Go to the app store, search "MyChart", open the app, select [place], and log in with your MyChart username and password.

## 2024-05-29 NOTE — Progress Notes (Signed)
 Patient's daughter informed this nurse that yesterday after the treatment her mother's cognitive issues worsened. Dr. Maria Shiner informed. He thinks that it might be from the premedication that patient received. Verbal order to change the Decadron  dose from 20 mg to 10 mg. Edwina Gram [facility] informed and she will change the treatment plan accordingly.

## 2024-05-29 NOTE — Progress Notes (Signed)
 Dexamethasone  dose in obinutuzumab careplan changed to 10 mg per Dr. Birt Bulla instructions.

## 2024-05-30 LAB — HEPATITIS B CORE ANTIBODY, TOTAL: HEP B CORE AB: NEGATIVE

## 2024-06-04 ENCOUNTER — Encounter (HOSPITAL_COMMUNITY): Payer: Self-pay | Admitting: Hematology & Oncology

## 2024-06-04 ENCOUNTER — Inpatient Hospital Stay

## 2024-06-04 VITALS — BP 145/78 | HR 102 | Temp 97.7°F | Resp 20

## 2024-06-04 DIAGNOSIS — Z5112 Encounter for antineoplastic immunotherapy: Secondary | ICD-10-CM | POA: Diagnosis not present

## 2024-06-04 DIAGNOSIS — C9110 Chronic lymphocytic leukemia of B-cell type not having achieved remission: Secondary | ICD-10-CM

## 2024-06-04 LAB — CMP (CANCER CENTER ONLY)
ALT: 9 U/L (ref 0–44)
AST: 12 U/L — ABNORMAL LOW (ref 15–41)
Albumin: 4.1 g/dL (ref 3.5–5.0)
Alkaline Phosphatase: 91 U/L (ref 38–126)
Anion gap: 10 (ref 5–15)
BUN: 14 mg/dL (ref 8–23)
CO2: 26 mmol/L (ref 22–32)
Calcium: 9.7 mg/dL (ref 8.9–10.3)
Chloride: 104 mmol/L (ref 98–111)
Creatinine: 0.95 mg/dL (ref 0.44–1.00)
GFR, Estimated: >60 mL/min (ref >60)
Glucose, Bld: 169 mg/dL — ABNORMAL HIGH (ref 70–99)
Potassium: 4.1 mmol/L (ref 3.5–5.1)
Sodium: 140 mmol/L (ref 135–145)
Total Bilirubin: 0.5 mg/dL (ref 0.0–1.2)
Total Protein: 6.5 g/dL (ref 6.5–8.1)

## 2024-06-04 LAB — CBC WITH DIFFERENTIAL (CANCER CENTER ONLY)
Abs Immature Granulocytes: 0.02 10*3/uL (ref 0.00–0.07)
Basophils Absolute: 0 10*3/uL (ref 0.0–0.1)
Basophils Relative: 1 %
Eosinophils Absolute: 0 10*3/uL (ref 0.0–0.5)
Eosinophils Relative: 1 %
HCT: 37.4 % (ref 36.0–46.0)
Hemoglobin: 12 g/dL (ref 12.0–15.0)
Immature Granulocytes: 1 %
Lymphocytes Relative: 30 %
Lymphs Abs: 1.2 10*3/uL (ref 0.7–4.0)
MCH: 29 pg (ref 26.0–34.0)
MCHC: 32.1 g/dL (ref 30.0–36.0)
MCV: 90.3 fL (ref 80.0–100.0)
Monocytes Absolute: 0.3 10*3/uL (ref 0.1–1.0)
Monocytes Relative: 7 %
Neutro Abs: 2.4 10*3/uL (ref 1.7–7.7)
Neutrophils Relative %: 60 %
Platelet Count: 224 10*3/uL (ref 150–400)
RBC: 4.14 MIL/uL (ref 3.87–5.11)
RDW: 16.2 % — ABNORMAL HIGH (ref 11.5–15.5)
Smear Review: NORMAL
WBC Count: 3.9 10*3/uL — ABNORMAL LOW (ref 4.0–10.5)
nRBC: 0 % (ref 0.0–0.2)

## 2024-06-04 LAB — HEPATITIS B SURFACE ANTIGEN: Hepatitis B Surface Ag: NONREACTIVE

## 2024-06-04 MED ORDER — CETIRIZINE HCL 10 MG/ML IV SOLN
10.0000 mg | Freq: Once | INTRAVENOUS | Status: AC
  Administered 2024-06-04: 10 mg via INTRAVENOUS
  Filled 2024-06-04: qty 1

## 2024-06-04 MED ORDER — ACETAMINOPHEN 325 MG PO TABS
650.0000 mg | ORAL_TABLET | Freq: Once | ORAL | Status: AC
  Administered 2024-06-04: 650 mg via ORAL
  Filled 2024-06-04: qty 2

## 2024-06-04 MED ORDER — SODIUM CHLORIDE 0.9 % IV SOLN
1000.0000 mg | Freq: Once | INTRAVENOUS | Status: AC
  Administered 2024-06-04: 1000 mg via INTRAVENOUS
  Filled 2024-06-04: qty 40

## 2024-06-04 MED ORDER — SODIUM CHLORIDE 0.9 % IV SOLN: INTRAVENOUS | Status: DC

## 2024-06-04 MED ORDER — DEXAMETHASONE SODIUM PHOSPHATE 10 MG/ML IJ SOLN
10.0000 mg | Freq: Once | INTRAMUSCULAR | Status: AC
  Administered 2024-06-04: 10 mg via INTRAVENOUS
  Filled 2024-06-04: qty 1

## 2024-06-04 NOTE — Patient Instructions (Signed)
 [facility] [place] [facility]  Discharge Instructions: Thank you for choosing [facility] to provide your oncology and hematology care.   If you have a lab appointment with the Cancer Center, please go directly to the Cancer Center and check in at the registration area.  Wear comfortable clothing and clothing appropriate for easy access to any Portacath or PICC line.   We strive to give you quality time with your provider. You may need to reschedule your appointment if you arrive late (15 or more minutes).  Arriving late affects you and other patients whose appointments are after yours.  Also, if you miss three or more appointments without notifying the office, you may be dismissed from the clinic at the provider's discretion.      For prescription refill requests, have your pharmacy contact our office and allow 72 hours for refills to be completed.    Today you received the following chemotherapy and/or immunotherapy agents Gazyva      To help prevent nausea and vomiting after your treatment, we encourage you to take your nausea medication as directed.  BELOW ARE SYMPTOMS THAT SHOULD BE REPORTED IMMEDIATELY: *FEVER GREATER THAN 100.4 F (38 C) OR HIGHER *CHILLS OR SWEATING *NAUSEA AND VOMITING THAT IS NOT CONTROLLED WITH YOUR NAUSEA MEDICATION *UNUSUAL SHORTNESS OF BREATH *UNUSUAL BRUISING OR BLEEDING *URINARY PROBLEMS (pain or burning when urinating, or frequent urination) *BOWEL PROBLEMS (unusual diarrhea, constipation, pain near the anus) TENDERNESS IN MOUTH AND THROAT WITH OR WITHOUT PRESENCE OF ULCERS (sore throat, sores in mouth, or a toothache) UNUSUAL RASH, SWELLING OR PAIN  UNUSUAL VAGINAL DISCHARGE OR ITCHING   Items with * indicate a potential emergency and should be followed up as soon as possible or go to the Emergency Department if any problems should occur.  Please show the CHEMOTHERAPY ALERT CARD or IMMUNOTHERAPY ALERT  CARD at check-in to the Emergency Department and triage nurse. Should you have questions after your visit or need to cancel or reschedule your appointment, please contact [facility] [place] - A DEPT OF [facility]  [phone] and follow the prompts.  Office hours are 8:00 a.m. to 4:30 p.m. Monday - Friday. Please note that voicemails left after 4:00 p.m. may not be returned until the following business day.  We are closed weekends and major holidays. You have access to a nurse at all times for urgent questions. Please call the main number to the clinic [phone] and follow the prompts.  For any non-urgent questions, you may also contact your provider using MyChart. We now offer e-Visits for anyone 75 and older to request care online for non-urgent symptoms. For details visit [email or web address].   Also download the MyChart app! Go to the app store, search "MyChart", open the app, select , and log in with your MyChart username and password.

## 2024-06-05 LAB — HEPATITIS B CORE ANTIBODY, TOTAL: HEP B CORE AB: NEGATIVE

## 2024-06-10 ENCOUNTER — Other Ambulatory Visit: Payer: Self-pay

## 2024-06-10 ENCOUNTER — Encounter (INDEPENDENT_AMBULATORY_CARE_PROVIDER_SITE_OTHER): Payer: Self-pay

## 2024-06-10 NOTE — Progress Notes (Signed)
 Specialty Pharmacy Refill Coordination Note  [name] is a 78 y.o. female contacted today regarding refills of specialty medication(s) Zanubrutinib  (Brukinsa )  Spoke with patient's daughter  Patient requested (Patient-Rptd) Delivery   Delivery date: 06/18/24   Verified address: (Patient-Rptd) [address]   Medication will be filled on 06.30.25.

## 2024-06-11 ENCOUNTER — Inpatient Hospital Stay (HOSPITAL_BASED_OUTPATIENT_CLINIC_OR_DEPARTMENT_OTHER): Admitting: Family

## 2024-06-11 ENCOUNTER — Encounter: Payer: Self-pay | Admitting: Family

## 2024-06-11 ENCOUNTER — Other Ambulatory Visit: Payer: Self-pay

## 2024-06-11 ENCOUNTER — Inpatient Hospital Stay

## 2024-06-11 VITALS — BP 144/61 | HR 89 | Temp 97.7°F | Resp 17

## 2024-06-11 DIAGNOSIS — C9110 Chronic lymphocytic leukemia of B-cell type not having achieved remission: Secondary | ICD-10-CM

## 2024-06-11 DIAGNOSIS — R945 Abnormal results of liver function studies: Secondary | ICD-10-CM | POA: Diagnosis not present

## 2024-06-11 DIAGNOSIS — Z5112 Encounter for antineoplastic immunotherapy: Secondary | ICD-10-CM | POA: Diagnosis not present

## 2024-06-11 LAB — CMP (CANCER CENTER ONLY)
ALT: 5 U/L (ref 0–44)
AST: 9 U/L — ABNORMAL LOW (ref 15–41)
Albumin: 4.1 g/dL (ref 3.5–5.0)
Alkaline Phosphatase: 82 U/L (ref 38–126)
Anion gap: 9 (ref 5–15)
BUN: 13 mg/dL (ref 8–23)
CO2: 26 mmol/L (ref 22–32)
Calcium: 9.5 mg/dL (ref 8.9–10.3)
Chloride: 105 mmol/L (ref 98–111)
Creatinine: 0.93 mg/dL (ref 0.44–1.00)
GFR, Estimated: >60 mL/min (ref >60)
Glucose, Bld: 126 mg/dL — ABNORMAL HIGH (ref 70–99)
Potassium: 4.6 mmol/L (ref 3.5–5.1)
Sodium: 140 mmol/L (ref 135–145)
Total Bilirubin: 0.6 mg/dL (ref 0.0–1.2)
Total Protein: 6.4 g/dL — ABNORMAL LOW (ref 6.5–8.1)

## 2024-06-11 LAB — CBC WITH DIFFERENTIAL (CANCER CENTER ONLY)
Abs Immature Granulocytes: 0.01 10*3/uL (ref 0.00–0.07)
Basophils Absolute: 0.1 10*3/uL (ref 0.0–0.1)
Basophils Relative: 1 %
Eosinophils Absolute: 0.1 10*3/uL (ref 0.0–0.5)
Eosinophils Relative: 2 %
HCT: 39.4 % (ref 36.0–46.0)
Hemoglobin: 12.8 g/dL (ref 12.0–15.0)
Immature Granulocytes: 0 %
Lymphocytes Relative: 29 %
Lymphs Abs: 1.2 10*3/uL (ref 0.7–4.0)
MCH: 29.3 pg (ref 26.0–34.0)
MCHC: 32.5 g/dL (ref 30.0–36.0)
MCV: 90.2 fL (ref 80.0–100.0)
Monocytes Absolute: 0.2 10*3/uL (ref 0.1–1.0)
Monocytes Relative: 6 %
Neutro Abs: 2.5 10*3/uL (ref 1.7–7.7)
Neutrophils Relative %: 62 %
Platelet Count: 251 10*3/uL (ref 150–400)
RBC: 4.37 MIL/uL (ref 3.87–5.11)
RDW: 16.3 % — ABNORMAL HIGH (ref 11.5–15.5)
Smear Review: NORMAL
WBC Count: 4.1 10*3/uL (ref 4.0–10.5)
nRBC: 0 % (ref 0.0–0.2)

## 2024-06-11 LAB — HEPATITIS PANEL, ACUTE
HCV Ab: NONREACTIVE
Hep A IgM: NONREACTIVE
Hep B C IgM: NONREACTIVE
Hepatitis B Surface Ag: NONREACTIVE

## 2024-06-11 LAB — URIC ACID: Uric Acid, Serum: 3.2 mg/dL (ref 2.5–7.1)

## 2024-06-11 MED ORDER — DEXAMETHASONE SODIUM PHOSPHATE 10 MG/ML IJ SOLN
10.0000 mg | Freq: Once | INTRAMUSCULAR | Status: AC
  Administered 2024-06-11: 10 mg via INTRAVENOUS
  Filled 2024-06-11: qty 1

## 2024-06-11 MED ORDER — SODIUM CHLORIDE 0.9 % IV SOLN: INTRAVENOUS | Status: DC

## 2024-06-11 MED ORDER — ACETAMINOPHEN 325 MG PO TABS
650.0000 mg | ORAL_TABLET | Freq: Once | ORAL | Status: AC
  Administered 2024-06-11: 650 mg via ORAL
  Filled 2024-06-11: qty 2

## 2024-06-11 MED ORDER — SODIUM CHLORIDE 0.9 % IV SOLN
1000.0000 mg | Freq: Once | INTRAVENOUS | Status: AC
  Administered 2024-06-11: 1000 mg via INTRAVENOUS
  Filled 2024-06-11: qty 40

## 2024-06-11 MED ORDER — CETIRIZINE HCL 10 MG/ML IV SOLN
10.0000 mg | Freq: Once | INTRAVENOUS | Status: AC
  Administered 2024-06-11: 10 mg via INTRAVENOUS
  Filled 2024-06-11: qty 1

## 2024-06-11 MED ORDER — SODIUM CHLORIDE 0.9% FLUSH: 10.0000 mL | INTRAVENOUS | Status: DC | PRN

## 2024-06-11 NOTE — Patient Instructions (Signed)
 [facility] [place] [facility]  Discharge Instructions: Thank you for choosing [facility] to provide your oncology and hematology care.   If you have a lab appointment with the Cancer Center, please go directly to the Cancer Center and check in at the registration area.  Wear comfortable clothing and clothing appropriate for easy access to any Portacath or PICC line.   We strive to give you quality time with your provider. You may need to reschedule your appointment if you arrive late (15 or more minutes).  Arriving late affects you and other patients whose appointments are after yours.  Also, if you miss three or more appointments without notifying the office, you may be dismissed from the clinic at the provider's discretion.      For prescription refill requests, have your pharmacy contact our office and allow 72 hours for refills to be completed.    Today you received the following chemotherapy and/or immunotherapy agents Gazyva      To help prevent nausea and vomiting after your treatment, we encourage you to take your nausea medication as directed.  BELOW ARE SYMPTOMS THAT SHOULD BE REPORTED IMMEDIATELY: *FEVER GREATER THAN 100.4 F (38 C) OR HIGHER *CHILLS OR SWEATING *NAUSEA AND VOMITING THAT IS NOT CONTROLLED WITH YOUR NAUSEA MEDICATION *UNUSUAL SHORTNESS OF BREATH *UNUSUAL BRUISING OR BLEEDING *URINARY PROBLEMS (pain or burning when urinating, or frequent urination) *BOWEL PROBLEMS (unusual diarrhea, constipation, pain near the anus) TENDERNESS IN MOUTH AND THROAT WITH OR WITHOUT PRESENCE OF ULCERS (sore throat, sores in mouth, or a toothache) UNUSUAL RASH, SWELLING OR PAIN  UNUSUAL VAGINAL DISCHARGE OR ITCHING   Items with * indicate a potential emergency and should be followed up as soon as possible or go to the Emergency Department if any problems should occur.  Please show the CHEMOTHERAPY ALERT CARD or IMMUNOTHERAPY ALERT  CARD at check-in to the Emergency Department and triage nurse. Should you have questions after your visit or need to cancel or reschedule your appointment, please contact [facility] [place] - A DEPT OF [facility]  [phone] and follow the prompts.  Office hours are 8:00 a.m. to 4:30 p.m. Monday - Friday. Please note that voicemails left after 4:00 p.m. may not be returned until the following business day.  We are closed weekends and major holidays. You have access to a nurse at all times for urgent questions. Please call the main number to the clinic [phone] and follow the prompts.  For any non-urgent questions, you may also contact your provider using MyChart. We now offer e-Visits for anyone 75 and older to request care online for non-urgent symptoms. For details visit [email or web address].   Also download the MyChart app! Go to the app store, search "MyChart", open the app, select , and log in with your MyChart username and password.

## 2024-06-11 NOTE — Progress Notes (Unsigned)
 Hematology and Oncology Follow Up Visit  [name] [id number] July 28, 1946 78 y.o. 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 q day -- maintanence on 09/06/2018 EC ASA 81 mg po q day Gazyva /Brukinsa /Venetoclax -- start on 05/28/2024   Interim History:  [name] is here today with her daughter for follow-up and treatment. She is doing well overall. Her energy has improved quite a bit along with her lab work. He biggest issue right now has been her development of sun downers. She is seeing a psychiatrist and is on medication to help with this. Her family is also encouraging her to walk, work jigsaw puzzles and limit her screen time.  Her appetite has been less so we encouraged her today to increase her protein intake.  Hydration is good. Weight is stable at 142 lbs.  No fever, chills, n/v, cough, rash, chest pain, palpitations, abdominal pain or changes in bowel or bladder habits.  She has history of SOB with exertion and occasional episodes of dizziness that are unchanged from baseline.  No swelling in her extremities.  Numbness, tingling and tenderness in her joints with arthritis.  No falls or syncope reported.  No obvious blood loss. No abnormal bruising or petechiae.   ECOG Performance Status: 1 - Symptomatic but completely ambulatory  Medications:  Allergies as of 06/11/2024       Reactions   Naprosyn [naproxen] Other (See Comments)   Angioedema   Naproxen Shortness Of Breath, Itching, Swelling   Tizanidine  Shortness Of Breath, Other (See Comments)   dizziness        Medication List        Accurate as of June 11, 2024  8:40 AM. If you have any questions, ask your nurse or doctor.          acetaminophen  650 MG CR tablet Commonly  known as: TYLENOL  Take 650 mg by mouth every 8 (eight) hours as needed for pain.   Advair Diskus 250-50 MCG/DOSE Aepb Generic drug: fluticasone -salmeterol Inhale one puff into the lungs 2 (two) times daily.   albuterol  108 (90 Base) MCG/ACT inhaler Commonly known as: ProAir  HFA Inhale 2 puffs into the lungs every 4 (four) hours as needed for wheezing or shortness of breath.   albuterol  (2.5 MG/3ML) 0.083% nebulizer solution Commonly known as: PROVENTIL  Take 3 mLs (2.5 mg total) by nebulization every 6 (six) hours as needed for wheezing or shortness of breath.   allopurinol  100 MG tablet Commonly known as: Zyloprim  Take 1 tablet (100 mg total) by mouth daily. Please start taking this 2 days before you start the IV medicine.  You only take this for 30 days and no more after that.  Pete   amLODipine  10 MG tablet Commonly known as: NORVASC  Take 10 mg by mouth daily.   Brukinsa  80 MG capsule Generic drug: zanubrutinib  Take 2 capsules (160 mg total) by mouth 2 (two) times daily.   cetirizine  10 MG tablet Commonly known as: ZYRTEC  Take 10 mg by mouth daily.   famciclovir  250 MG tablet Commonly known as: FAMVIR  Take 1 tablet (250 mg total) by mouth daily.   famotidine  20 MG tablet Commonly known as: PEPCID  TAKE ONE TABLET BY MOUTH AT BEDTIME   ferrous sulfate  324 (65 Fe) MG Tbec Take 1 tablet (325 mg total)  by mouth daily.   fluticasone  50 MCG/ACT nasal spray Commonly known as: FLONASE  Place 2 sprays into both nostrils daily.   latanoprost  0.005 % ophthalmic solution Commonly known as: XALATAN  Place 1 drop into both eyes at bedtime.   levothyroxine  50 MCG tablet Commonly known as: SYNTHROID  Take 50 mcg by mouth daily.   meclizine  25 MG tablet Commonly known as: ANTIVERT  Take 25-50 mg by mouth 3 (three) times daily.   memantine 5 MG tablet Commonly known as: NAMENDA Take 5 mg by mouth 2 (two) times daily.   montelukast  10 MG tablet Commonly known as:  SINGULAIR  Take 10 mg by mouth at bedtime.   ondansetron  8 MG tablet Commonly known as: Zofran  Take 1 tablet (8 mg total) by mouth every 8 (eight) hours as needed for nausea or vomiting.   pantoprazole  40 MG tablet Commonly known as: PROTONIX  Take 40 mg by mouth daily.   prochlorperazine  10 MG tablet Commonly known as: COMPAZINE  Take 1 tablet (10 mg total) by mouth every 6 (six) hours as needed for nausea or vomiting.   rOPINIRole  2 MG tablet Commonly known as: REQUIP  Take 2 mg by mouth at bedtime.   traZODone 50 MG tablet Commonly known as: DESYREL Take 50 mg by mouth at bedtime.   venlafaxine  XR 150 MG 24 hr capsule Commonly known as: EFFEXOR -XR Take 150 mg by mouth every morning.   venlafaxine  XR 37.5 MG 24 hr capsule Commonly known as: EFFEXOR -XR Take 37.5 mg by mouth daily.   Vitamin D (Ergocalciferol) 1.25 MG (50000 UNIT) Caps capsule Commonly known as: DRISDOL Take 50,000 Units by mouth every Sunday.   Xarelto  10 MG Tabs tablet Generic drug: rivaroxaban  TAKE ONE TABLET BY MOUTH EVERY DAY WITH SUPPER        Allergies:  Allergies  Allergen Reactions   Naprosyn [Naproxen] Other (See Comments)    Angioedema   Naproxen Shortness Of Breath, Itching and Swelling   Tizanidine  Shortness Of Breath and Other (See Comments)    dizziness    Past Medical History, Surgical history, Social history, and Family History were reviewed and updated.  Review of Systems: All other 10 point review of systems is negative.   Physical Exam:  vitals were not taken for this visit.   Wt Readings from Last 3 Encounters:  05/22/24 143 lb (64.9 kg)  05/15/24 144 lb 1.3 oz (65.4 kg)  04/24/24 144 lb 1.3 oz (65.4 kg)    Ocular: Sclerae unicteric, pupils equal, round and reactive to light Ear-nose-throat: Oropharynx clear, dentition fair Lymphatic: No cervical or supraclavicular adenopathy Lungs no rales or rhonchi, good excursion bilaterally Heart regular rate and rhythm, no  murmur appreciated Abd soft, nontender, positive bowel sounds MSK no focal spinal tenderness, no joint edema Neuro: non-focal, well-oriented, appropriate affect Breasts: Deferred   Lab Results  Component Value Date   WBC 4.1 06/11/2024   HGB 12.8 06/11/2024   HCT 39.4 06/11/2024   MCV 90.2 06/11/2024   PLT 251 06/11/2024   Lab Results  Component Value Date   FERRITIN 435 (H) 05/22/2024   IRON 30 05/22/2024   TIBC 234 (L) 05/22/2024   UIBC 204 05/22/2024   IRONPCTSAT 13 05/22/2024   Lab Results  Component Value Date   RETICCTPCT 2.3 05/22/2024   RBC 4.37 06/11/2024   Lab Results  Component Value Date   KPAFRELGTCHN 111.9 (H) 05/22/2024   LAMBDASER 18.5 05/22/2024   KAPLAMBRATIO 6.05 (H) 05/22/2024   Lab Results  Component Value Date  IGGSERUM 741 05/22/2024   IGA 107 05/22/2024   IGMSERUM 255 (H) 05/22/2024   No results found for: STEPHANY CARLOTA BENSON MARKEL EARLA JOANNIE DOC VICK, SPEI   Chemistry      Component Value Date/Time   NA 140 06/04/2024 0740   NA 141 11/03/2015 1117   K 4.1 06/04/2024 0740   CL 104 06/04/2024 0740   CO2 26 06/04/2024 0740   BUN 14 06/04/2024 0740   BUN 22 11/03/2015 1117   CREATININE 0.95 06/04/2024 0740      Component Value Date/Time   CALCIUM 9.7 06/04/2024 0740   ALKPHOS 91 06/04/2024 0740   AST 12 (L) 06/04/2024 0740   ALT 9 06/04/2024 0740   BILITOT 0.5 06/04/2024 0740       Impression and Plan:  Ms. Wible is a very pleasant 78 yo caucasian female with history of pulmonary emboli and lower extremity DVT with transiently positive lupus anticoagulant (resolved). 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 This has improved tremendously with treatment so far.  Follow-up 1 month for start of cycle 2.   Lauraine Pepper, NP 6/24/20258:40 AM

## 2024-06-12 ENCOUNTER — Other Ambulatory Visit: Payer: Self-pay

## 2024-06-12 ENCOUNTER — Encounter: Payer: Self-pay | Admitting: Hematology & Oncology

## 2024-06-13 ENCOUNTER — Other Ambulatory Visit: Payer: Self-pay

## 2024-06-13 NOTE — Progress Notes (Signed)
 Specialty Pharmacy Ongoing Clinical Assessment Note  [name] is a 78 y.o. female who is being followed by the specialty pharmacy service for RxSp Oncology   Patient's specialty medication(s) reviewed today: Zanubrutinib  (Brukinsa )   Missed doses in the last 4 weeks: 0   Patient/Caregiver did not have any additional questions or concerns.   Therapeutic benefit summary: Patient is achieving benefit   Adverse events/side effects summary: No adverse events/side effects   Patient's therapy is appropriate to: Continue    Goals Addressed             This Visit's Progress    Maintain optimal adherence to therapy   On track    Patient is on track. Patient will maintain adherence         Follow up: 3 months  [facility] Specialty Pharmacist

## 2024-06-14 ENCOUNTER — Other Ambulatory Visit: Payer: Self-pay

## 2024-06-16 ENCOUNTER — Other Ambulatory Visit: Payer: Self-pay

## 2024-06-18 ENCOUNTER — Other Ambulatory Visit: Payer: Self-pay | Admitting: Hematology & Oncology

## 2024-06-18 DIAGNOSIS — C9110 Chronic lymphocytic leukemia of B-cell type not having achieved remission: Secondary | ICD-10-CM

## 2024-06-20 ENCOUNTER — Other Ambulatory Visit: Payer: Self-pay | Admitting: Hematology & Oncology

## 2024-06-25 ENCOUNTER — Inpatient Hospital Stay: Attending: Hematology & Oncology

## 2024-06-25 ENCOUNTER — Inpatient Hospital Stay

## 2024-06-25 ENCOUNTER — Inpatient Hospital Stay (HOSPITAL_BASED_OUTPATIENT_CLINIC_OR_DEPARTMENT_OTHER): Admitting: Family

## 2024-06-25 ENCOUNTER — Encounter: Payer: Self-pay | Admitting: Hematology & Oncology

## 2024-06-25 VITALS — BP 159/69 | HR 87 | Temp 97.8°F | Resp 18 | Ht 61.0 in | Wt 141.1 lb

## 2024-06-25 VITALS — BP 157/68 | HR 90 | Temp 98.9°F | Resp 18

## 2024-06-25 DIAGNOSIS — D6862 Lupus anticoagulant syndrome: Secondary | ICD-10-CM | POA: Diagnosis not present

## 2024-06-25 DIAGNOSIS — Z79620 Long term (current) use of immunosuppressive biologic: Secondary | ICD-10-CM | POA: Diagnosis not present

## 2024-06-25 DIAGNOSIS — Z86718 Personal history of other venous thrombosis and embolism: Secondary | ICD-10-CM | POA: Diagnosis not present

## 2024-06-25 DIAGNOSIS — Z7901 Long term (current) use of anticoagulants: Secondary | ICD-10-CM | POA: Diagnosis not present

## 2024-06-25 DIAGNOSIS — D649 Anemia, unspecified: Secondary | ICD-10-CM | POA: Diagnosis not present

## 2024-06-25 DIAGNOSIS — Z5112 Encounter for antineoplastic immunotherapy: Secondary | ICD-10-CM | POA: Insufficient documentation

## 2024-06-25 DIAGNOSIS — R161 Splenomegaly, not elsewhere classified: Secondary | ICD-10-CM | POA: Insufficient documentation

## 2024-06-25 DIAGNOSIS — Z86711 Personal history of pulmonary embolism: Secondary | ICD-10-CM | POA: Diagnosis not present

## 2024-06-25 DIAGNOSIS — C9110 Chronic lymphocytic leukemia of B-cell type not having achieved remission: Secondary | ICD-10-CM | POA: Diagnosis not present

## 2024-06-25 DIAGNOSIS — I2699 Other pulmonary embolism without acute cor pulmonale: Secondary | ICD-10-CM | POA: Diagnosis present

## 2024-06-25 DIAGNOSIS — Z886 Allergy status to analgesic agent status: Secondary | ICD-10-CM | POA: Diagnosis not present

## 2024-06-25 DIAGNOSIS — Z79899 Other long term (current) drug therapy: Secondary | ICD-10-CM | POA: Insufficient documentation

## 2024-06-25 DIAGNOSIS — R945 Abnormal results of liver function studies: Secondary | ICD-10-CM

## 2024-06-25 LAB — CBC WITH DIFFERENTIAL (CANCER CENTER ONLY)
Abs Immature Granulocytes: 0.01 K/uL (ref 0.00–0.07)
Basophils Absolute: 0.1 K/uL (ref 0.0–0.1)
Basophils Relative: 2 %
Eosinophils Absolute: 0.1 K/uL (ref 0.0–0.5)
Eosinophils Relative: 3 %
HCT: 39.2 % (ref 36.0–46.0)
Hemoglobin: 13.1 g/dL (ref 12.0–15.0)
Immature Granulocytes: 0 %
Lymphocytes Relative: 32 %
Lymphs Abs: 1.2 K/uL (ref 0.7–4.0)
MCH: 30.5 pg (ref 26.0–34.0)
MCHC: 33.4 g/dL (ref 30.0–36.0)
MCV: 91.2 fL (ref 80.0–100.0)
Monocytes Absolute: 0.3 K/uL (ref 0.1–1.0)
Monocytes Relative: 7 %
Neutro Abs: 2.1 K/uL (ref 1.7–7.7)
Neutrophils Relative %: 56 %
Platelet Count: 131 K/uL — ABNORMAL LOW (ref 150–400)
RBC: 4.3 MIL/uL (ref 3.87–5.11)
RDW: 15.9 % — ABNORMAL HIGH (ref 11.5–15.5)
WBC Count: 3.7 K/uL — ABNORMAL LOW (ref 4.0–10.5)
nRBC: 0 % (ref 0.0–0.2)

## 2024-06-25 LAB — CMP (CANCER CENTER ONLY)
ALT: 9 U/L (ref 0–44)
AST: 12 U/L — ABNORMAL LOW (ref 15–41)
Albumin: 4.2 g/dL (ref 3.5–5.0)
Alkaline Phosphatase: 76 U/L (ref 38–126)
Anion gap: 9 (ref 5–15)
BUN: 12 mg/dL (ref 8–23)
CO2: 28 mmol/L (ref 22–32)
Calcium: 9.8 mg/dL (ref 8.9–10.3)
Chloride: 103 mmol/L (ref 98–111)
Creatinine: 0.93 mg/dL (ref 0.44–1.00)
GFR, Estimated: >60 mL/min (ref >60)
Glucose, Bld: 118 mg/dL — ABNORMAL HIGH (ref 70–99)
Potassium: 4.2 mmol/L (ref 3.5–5.1)
Sodium: 140 mmol/L (ref 135–145)
Total Bilirubin: 0.5 mg/dL (ref 0.0–1.2)
Total Protein: 6.6 g/dL (ref 6.5–8.1)

## 2024-06-25 LAB — URIC ACID: Uric Acid, Serum: 3 mg/dL (ref 2.5–7.1)

## 2024-06-25 MED ORDER — ACETAMINOPHEN 325 MG PO TABS
650.0000 mg | ORAL_TABLET | Freq: Once | ORAL | Status: AC
  Administered 2024-06-25: 650 mg via ORAL
  Filled 2024-06-25: qty 2

## 2024-06-25 MED ORDER — CETIRIZINE HCL 10 MG/ML IV SOLN
10.0000 mg | Freq: Once | INTRAVENOUS | Status: AC
  Administered 2024-06-25: 10 mg via INTRAVENOUS
  Filled 2024-06-25: qty 1

## 2024-06-25 MED ORDER — DEXAMETHASONE SODIUM PHOSPHATE 10 MG/ML IJ SOLN
10.0000 mg | Freq: Once | INTRAMUSCULAR | Status: AC
  Administered 2024-06-25: 10 mg via INTRAVENOUS
  Filled 2024-06-25: qty 1

## 2024-06-25 MED ORDER — SODIUM CHLORIDE 0.9 % IV SOLN: INTRAVENOUS | Status: DC

## 2024-06-25 MED ORDER — SODIUM CHLORIDE 0.9 % IV SOLN
1000.0000 mg | Freq: Once | INTRAVENOUS | Status: AC
  Administered 2024-06-25: 1000 mg via INTRAVENOUS
  Filled 2024-06-25: qty 40

## 2024-06-25 NOTE — Progress Notes (Signed)
 Hematology and Oncology Follow Up Visit  MITCHELLE SULTAN [id number] 1946-02-23 78 y.o. 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 q day -- maintanence on 09/06/2018 EC ASA 81 mg po q day Gazyva /Brukinsa /Venetoclax -- start on 05/28/2024   Interim History:  Ms. Raftery is here today with a friend for follow-up and treatment cycle 2. She is doing well and states that her vertigo/dizziness is much improved.  No new falls, no syncope.  Energy has been good.  No fever, chills, n/v, cough, rash, SOB, chest pain, palpitations, abdominal pain or changes in bowel or bladder habits.  No swelling, tenderness, numbness or tingling in her extremities.  Appetite and hydration are good. Weight is stable at 141 lbs.   ECOG Performance Status: 1 - Symptomatic but completely ambulatory  Medications:  Allergies as of 06/25/2024       Reactions   Naprosyn [naproxen] Other (See Comments)   Angioedema   Naproxen Shortness Of Breath, Itching, Swelling   Tizanidine  Shortness Of Breath, Other (See Comments)   dizziness        Medication List        Accurate as of June 25, 2024  9:09 AM. If you have any questions, ask your nurse or doctor.          acetaminophen  650 MG CR tablet Commonly known as: TYLENOL  Take 650 mg by mouth every 8 (eight) hours as needed for pain.   Advair Diskus 250-50 MCG/DOSE Aepb Generic drug: fluticasone -salmeterol Inhale one puff into the lungs 2 (two) times daily.   albuterol  108 (90 Base) MCG/ACT inhaler Commonly known as: ProAir  HFA Inhale 2 puffs into the lungs every 4 (four) hours as needed for wheezing or shortness of breath.   albuterol  (2.5 MG/3ML) 0.083% nebulizer solution Commonly known as: PROVENTIL  Take 3 mLs (2.5 mg total)  by nebulization every 6 (six) hours as needed for wheezing or shortness of breath.   allopurinol  100 MG tablet Commonly known as: Zyloprim  Take 1 tablet (100 mg total) by mouth daily. Please start taking this 2 days before you start the IV medicine.  You only take this for 30 days and no more after that.  Pete   amLODipine  10 MG tablet Commonly known as: NORVASC  Take 10 mg by mouth daily.   Brukinsa  80 MG capsule Generic drug: zanubrutinib  Take 2 capsules (160 mg total) by mouth 2 (two) times daily.   cetirizine  10 MG tablet Commonly known as: ZYRTEC  Take 10 mg by mouth daily.   famciclovir  250 MG tablet Commonly known as: FAMVIR  Take 1 tablet (250 mg total) by mouth daily.   famotidine  20 MG tablet Commonly known as: PEPCID  TAKE ONE TABLET BY MOUTH AT BEDTIME   ferrous sulfate  324 (65 Fe) MG Tbec Take 1 tablet (325 mg total) by mouth daily.   fluticasone  50 MCG/ACT nasal spray Commonly known as: FLONASE  Place 2 sprays into both nostrils daily.   latanoprost  0.005 % ophthalmic solution Commonly known as: XALATAN  Place 1 drop into both eyes at bedtime.   levothyroxine  50 MCG tablet Commonly known as: SYNTHROID  Take 50 mcg by mouth daily.   meclizine  25 MG tablet Commonly known as: ANTIVERT  Take 25-50 mg by mouth 3 (three) times daily.   memantine 5 MG tablet Commonly known as: NAMENDA Take  5 mg by mouth 2 (two) times daily.   montelukast  10 MG tablet Commonly known as: SINGULAIR  Take 10 mg by mouth at bedtime.   ondansetron  8 MG tablet Commonly known as: Zofran  Take 1 tablet (8 mg total) by mouth every 8 (eight) hours as needed for nausea or vomiting.   pantoprazole  40 MG tablet Commonly known as: PROTONIX  Take 40 mg by mouth daily.   prochlorperazine  10 MG tablet Commonly known as: COMPAZINE  Take 1 tablet (10 mg total) by mouth every 6 (six) hours as needed for nausea or vomiting.   rOPINIRole  2 MG tablet Commonly known as: REQUIP  Take 2 mg by mouth  at bedtime.   traZODone 50 MG tablet Commonly known as: DESYREL Take 50 mg by mouth at bedtime.   venlafaxine  XR 150 MG 24 hr capsule Commonly known as: EFFEXOR -XR Take 150 mg by mouth every morning.   venlafaxine  XR 37.5 MG 24 hr capsule Commonly known as: EFFEXOR -XR Take 37.5 mg by mouth daily.   Vitamin D (Ergocalciferol) 1.25 MG (50000 UNIT) Caps capsule Commonly known as: DRISDOL Take 50,000 Units by mouth every Sunday.   Xarelto  10 MG Tabs tablet Generic drug: rivaroxaban  TAKE ONE TABLET BY MOUTH EVERY DAY WITH SUPPER        Allergies:  Allergies  Allergen Reactions   Naprosyn [Naproxen] Other (See Comments)    Angioedema   Naproxen Shortness Of Breath, Itching and Swelling   Tizanidine  Shortness Of Breath and Other (See Comments)    dizziness    Past Medical History, Surgical history, Social history, and Family History were reviewed and updated.  Review of Systems: All other 10 point review of systems is negative.   Physical Exam:  height is 5' 1 (1.549 m) and weight is 141 lb 1.9 oz (64 kg). Her oral temperature is 97.8 F (36.6 C). Her blood pressure is 159/69 (abnormal) and her pulse is 87. Her respiration is 18 and oxygen  saturation is 100%.   Wt Readings from Last 3 Encounters:  06/25/24 141 lb 1.9 oz (64 kg)  05/22/24 143 lb (64.9 kg)  05/15/24 144 lb 1.3 oz (65.4 kg)    Ocular: Sclerae unicteric, pupils equal, round and reactive to light Ear-nose-throat: Oropharynx clear, dentition fair Lymphatic: No cervical or supraclavicular adenopathy Lungs no rales or rhonchi, good excursion bilaterally Heart regular rate and rhythm, no murmur appreciated Abd soft, nontender, positive bowel sounds MSK no focal spinal tenderness, no joint edema Neuro: non-focal, well-oriented, appropriate affect Breasts: Deferred   Lab Results  Component Value Date   WBC 3.7 (L) 06/25/2024   HGB 13.1 06/25/2024   HCT 39.2 06/25/2024   MCV 91.2 06/25/2024   PLT 131  (L) 06/25/2024   Lab Results  Component Value Date   FERRITIN 435 (H) 05/22/2024   IRON 30 05/22/2024   TIBC 234 (L) 05/22/2024   UIBC 204 05/22/2024   IRONPCTSAT 13 05/22/2024   Lab Results  Component Value Date   RETICCTPCT 2.3 05/22/2024   RBC 4.30 06/25/2024   Lab Results  Component Value Date   KPAFRELGTCHN 111.9 (H) 05/22/2024   LAMBDASER 18.5 05/22/2024   KAPLAMBRATIO 6.05 (H) 05/22/2024   Lab Results  Component Value Date   IGGSERUM 741 05/22/2024   IGA 107 05/22/2024   IGMSERUM 255 (H) 05/22/2024   No results found for: STEPHANY RINGS, A1GS, A2GS, BETS, BETA2SER, GAMS, MSPIKE, SPEI   Chemistry      Component Value Date/Time   NA 140 06/25/2024 0742  NA 141 11/03/2015 1117   K 4.2 06/25/2024 0742   CL 103 06/25/2024 0742   CO2 28 06/25/2024 0742   BUN 12 06/25/2024 0742   BUN 22 11/03/2015 1117   CREATININE 0.93 06/25/2024 0742      Component Value Date/Time   CALCIUM 9.8 06/25/2024 0742   ALKPHOS 76 06/25/2024 0742   AST 12 (L) 06/25/2024 0742   ALT 9 06/25/2024 0742   BILITOT 0.5 06/25/2024 0742       Impression and Plan: Ms. Bartelt is a very pleasant 78 yo caucasian female with history of pulmonary emboli and lower extremity DVT with transiently positive lupus anticoagulant (resolved). 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 This has improved tremendously with treatment so far.  Follow-up 1 month for start of cycle 3.   Lauraine Pepper, NP 7/8/20259:09 AM

## 2024-06-25 NOTE — Patient Instructions (Signed)
 [facility] [place] [facility]  Discharge Instructions: Thank you for choosing [facility] to provide your oncology and hematology care.   If you have a lab appointment with the Cancer Center, please go directly to the Cancer Center and check in at the registration area.  Wear comfortable clothing and clothing appropriate for easy access to any Portacath or PICC line.   We strive to give you quality time with your provider. You may need to reschedule your appointment if you arrive late (15 or more minutes).  Arriving late affects you and other patients whose appointments are after yours.  Also, if you miss three or more appointments without notifying the office, you may be dismissed from the clinic at the provider's discretion.      For prescription refill requests, have your pharmacy contact our office and allow 72 hours for refills to be completed.    Today you received the following chemotherapy and/or immunotherapy agents Gazyva      To help prevent nausea and vomiting after your treatment, we encourage you to take your nausea medication as directed.  BELOW ARE SYMPTOMS THAT SHOULD BE REPORTED IMMEDIATELY: *FEVER GREATER THAN 100.4 F (38 C) OR HIGHER *CHILLS OR SWEATING *NAUSEA AND VOMITING THAT IS NOT CONTROLLED WITH YOUR NAUSEA MEDICATION *UNUSUAL SHORTNESS OF BREATH *UNUSUAL BRUISING OR BLEEDING *URINARY PROBLEMS (pain or burning when urinating, or frequent urination) *BOWEL PROBLEMS (unusual diarrhea, constipation, pain near the anus) TENDERNESS IN MOUTH AND THROAT WITH OR WITHOUT PRESENCE OF ULCERS (sore throat, sores in mouth, or a toothache) UNUSUAL RASH, SWELLING OR PAIN  UNUSUAL VAGINAL DISCHARGE OR ITCHING   Items with * indicate a potential emergency and should be followed up as soon as possible or go to the Emergency Department if any problems should occur.  Please show the CHEMOTHERAPY ALERT CARD or IMMUNOTHERAPY ALERT  CARD at check-in to the Emergency Department and triage nurse. Should you have questions after your visit or need to cancel or reschedule your appointment, please contact [facility] [place] - A DEPT OF [facility]  [phone] and follow the prompts.  Office hours are 8:00 a.m. to 4:30 p.m. Monday - Friday. Please note that voicemails left after 4:00 p.m. may not be returned until the following business day.  We are closed weekends and major holidays. You have access to a nurse at all times for urgent questions. Please call the main number to the clinic [phone] and follow the prompts.  For any non-urgent questions, you may also contact your provider using MyChart. We now offer e-Visits for anyone 75 and older to request care online for non-urgent symptoms. For details visit [email or web address].   Also download the MyChart app! Go to the app store, search "MyChart", open the app, select , and log in with your MyChart username and password.

## 2024-06-26 ENCOUNTER — Other Ambulatory Visit: Payer: Self-pay

## 2024-07-16 ENCOUNTER — Other Ambulatory Visit: Payer: Self-pay

## 2024-07-16 NOTE — Progress Notes (Signed)
 Specialty Pharmacy Refill Coordination Note  [name] is a 78 y.o. female contacted today regarding refills of specialty medication(s) Zanubrutinib  (Brukinsa )   Patient requested Delivery   Delivery date: 07/17/24   Verified address: [address]   [place] [address]   Medication will be filled on 07/16/24.

## 2024-07-18 ENCOUNTER — Other Ambulatory Visit: Payer: Self-pay

## 2024-07-23 ENCOUNTER — Inpatient Hospital Stay

## 2024-07-23 ENCOUNTER — Other Ambulatory Visit: Payer: Self-pay

## 2024-07-23 ENCOUNTER — Inpatient Hospital Stay (HOSPITAL_BASED_OUTPATIENT_CLINIC_OR_DEPARTMENT_OTHER): Admitting: Hematology & Oncology

## 2024-07-23 ENCOUNTER — Encounter: Payer: Self-pay | Admitting: Hematology & Oncology

## 2024-07-23 ENCOUNTER — Inpatient Hospital Stay: Attending: Hematology & Oncology

## 2024-07-23 VITALS — BP 180/85 | HR 90 | Temp 98.7°F | Resp 18

## 2024-07-23 VITALS — BP 160/78 | HR 96 | Temp 97.9°F | Resp 16 | Ht 61.0 in | Wt 140.0 lb

## 2024-07-23 DIAGNOSIS — Z886 Allergy status to analgesic agent status: Secondary | ICD-10-CM | POA: Diagnosis not present

## 2024-07-23 DIAGNOSIS — Z86718 Personal history of other venous thrombosis and embolism: Secondary | ICD-10-CM | POA: Diagnosis not present

## 2024-07-23 DIAGNOSIS — E039 Hypothyroidism, unspecified: Secondary | ICD-10-CM | POA: Diagnosis not present

## 2024-07-23 DIAGNOSIS — Z7901 Long term (current) use of anticoagulants: Secondary | ICD-10-CM | POA: Insufficient documentation

## 2024-07-23 DIAGNOSIS — C9110 Chronic lymphocytic leukemia of B-cell type not having achieved remission: Secondary | ICD-10-CM

## 2024-07-23 DIAGNOSIS — Z86711 Personal history of pulmonary embolism: Secondary | ICD-10-CM | POA: Insufficient documentation

## 2024-07-23 DIAGNOSIS — D6862 Lupus anticoagulant syndrome: Secondary | ICD-10-CM | POA: Insufficient documentation

## 2024-07-23 DIAGNOSIS — Z5112 Encounter for antineoplastic immunotherapy: Secondary | ICD-10-CM | POA: Diagnosis present

## 2024-07-23 DIAGNOSIS — I2699 Other pulmonary embolism without acute cor pulmonale: Secondary | ICD-10-CM | POA: Diagnosis present

## 2024-07-23 DIAGNOSIS — Z79899 Other long term (current) drug therapy: Secondary | ICD-10-CM | POA: Diagnosis not present

## 2024-07-23 DIAGNOSIS — R161 Splenomegaly, not elsewhere classified: Secondary | ICD-10-CM | POA: Insufficient documentation

## 2024-07-23 LAB — CMP (CANCER CENTER ONLY)
ALT: 11 U/L (ref 0–44)
AST: 18 U/L (ref 15–41)
Albumin: 4.6 g/dL (ref 3.5–5.0)
Alkaline Phosphatase: 124 U/L (ref 38–126)
Anion gap: 11 (ref 5–15)
BUN: 11 mg/dL (ref 8–23)
CO2: 26 mmol/L (ref 22–32)
Calcium: 10.6 mg/dL — ABNORMAL HIGH (ref 8.9–10.3)
Chloride: 105 mmol/L (ref 98–111)
Creatinine: 0.96 mg/dL (ref 0.44–1.00)
GFR, Estimated: >60 mL/min (ref >60)
Glucose, Bld: 141 mg/dL — ABNORMAL HIGH (ref 70–99)
Potassium: 5.2 mmol/L — ABNORMAL HIGH (ref 3.5–5.1)
Sodium: 141 mmol/L (ref 135–145)
Total Bilirubin: 0.7 mg/dL (ref 0.0–1.2)
Total Protein: 7 g/dL (ref 6.5–8.1)

## 2024-07-23 LAB — CBC WITH DIFFERENTIAL (CANCER CENTER ONLY)
Abs Granulocyte: 2.3 K/uL (ref 1.5–6.5)
Abs Immature Granulocytes: 0.06 K/uL (ref 0.00–0.07)
Basophils Absolute: 0.1 K/uL (ref 0.0–0.1)
Basophils Relative: 2 %
Eosinophils Absolute: 0.1 K/uL (ref 0.0–0.5)
Eosinophils Relative: 2 %
HCT: 43.1 % (ref 36.0–46.0)
Hemoglobin: 14.4 g/dL (ref 12.0–15.0)
Immature Granulocytes: 1 %
Lymphocytes Relative: 33 %
Lymphs Abs: 1.4 K/uL (ref 0.7–4.0)
MCH: 30.8 pg (ref 26.0–34.0)
MCHC: 33.4 g/dL (ref 30.0–36.0)
MCV: 92.3 fL (ref 80.0–100.0)
Monocytes Absolute: 0.3 K/uL (ref 0.1–1.0)
Monocytes Relative: 7 %
Neutro Abs: 2.3 K/uL (ref 1.7–7.7)
Neutrophils Relative %: 55 %
Platelet Count: 205 K/uL (ref 150–400)
RBC: 4.67 MIL/uL (ref 3.87–5.11)
RDW: 14.7 % (ref 11.5–15.5)
Smear Review: NORMAL
WBC Count: 4.2 K/uL (ref 4.0–10.5)
nRBC: 0 % (ref 0.0–0.2)

## 2024-07-23 LAB — URIC ACID: Uric Acid, Serum: 3.4 mg/dL (ref 2.5–7.1)

## 2024-07-23 MED ORDER — SODIUM CHLORIDE 0.9 % IV SOLN: INTRAVENOUS | Status: DC

## 2024-07-23 MED ORDER — CETIRIZINE HCL 10 MG/ML IV SOLN
10.0000 mg | Freq: Once | INTRAVENOUS | Status: AC
  Administered 2024-07-23: 10 mg via INTRAVENOUS
  Filled 2024-07-23: qty 1

## 2024-07-23 MED ORDER — DEXAMETHASONE SODIUM PHOSPHATE 10 MG/ML IJ SOLN
10.0000 mg | Freq: Once | INTRAMUSCULAR | Status: AC
  Administered 2024-07-23: 10 mg via INTRAVENOUS
  Filled 2024-07-23: qty 1

## 2024-07-23 MED ORDER — ACETAMINOPHEN 325 MG PO TABS
650.0000 mg | ORAL_TABLET | Freq: Once | ORAL | Status: AC
  Administered 2024-07-23: 650 mg via ORAL
  Filled 2024-07-23: qty 2

## 2024-07-23 MED ORDER — SODIUM CHLORIDE 0.9 % IV SOLN
1000.0000 mg | Freq: Once | INTRAVENOUS | Status: AC
  Administered 2024-07-23: 1000 mg via INTRAVENOUS
  Filled 2024-07-23: qty 40

## 2024-07-23 NOTE — Patient Instructions (Signed)
 [facility] [place] [facility]  Discharge Instructions: Thank you for choosing [facility] to provide your oncology and hematology care.   If you have a lab appointment with the Cancer Center, please go directly to the Cancer Center and check in at the registration area.  Wear comfortable clothing and clothing appropriate for easy access to any Portacath or PICC line.   We strive to give you quality time with your provider. You may need to reschedule your appointment if you arrive late (15 or more minutes).  Arriving late affects you and other patients whose appointments are after yours.  Also, if you miss three or more appointments without notifying the office, you may be dismissed from the clinic at the provider's discretion.      For prescription refill requests, have your pharmacy contact our office and allow 72 hours for refills to be completed.    Today you received the following chemotherapy and/or immunotherapy agents Gazyva      To help prevent nausea and vomiting after your treatment, we encourage you to take your nausea medication as directed.  BELOW ARE SYMPTOMS THAT SHOULD BE REPORTED IMMEDIATELY: *FEVER GREATER THAN 100.4 F (38 C) OR HIGHER *CHILLS OR SWEATING *NAUSEA AND VOMITING THAT IS NOT CONTROLLED WITH YOUR NAUSEA MEDICATION *UNUSUAL SHORTNESS OF BREATH *UNUSUAL BRUISING OR BLEEDING *URINARY PROBLEMS (pain or burning when urinating, or frequent urination) *BOWEL PROBLEMS (unusual diarrhea, constipation, pain near the anus) TENDERNESS IN MOUTH AND THROAT WITH OR WITHOUT PRESENCE OF ULCERS (sore throat, sores in mouth, or a toothache) UNUSUAL RASH, SWELLING OR PAIN  UNUSUAL VAGINAL DISCHARGE OR ITCHING   Items with * indicate a potential emergency and should be followed up as soon as possible or go to the Emergency Department if any problems should occur.  Please show the CHEMOTHERAPY ALERT CARD or IMMUNOTHERAPY ALERT  CARD at check-in to the Emergency Department and triage nurse. Should you have questions after your visit or need to cancel or reschedule your appointment, please contact [facility] [place] - A DEPT OF [facility]  [phone] and follow the prompts.  Office hours are 8:00 a.m. to 4:30 p.m. Monday - Friday. Please note that voicemails left after 4:00 p.m. may not be returned until the following business day.  We are closed weekends and major holidays. You have access to a nurse at all times for urgent questions. Please call the main number to the clinic [phone] and follow the prompts.  For any non-urgent questions, you may also contact your provider using MyChart. We now offer e-Visits for anyone 75 and older to request care online for non-urgent symptoms. For details visit [email or web address].   Also download the MyChart app! Go to the app store, search "MyChart", open the app, select , and log in with your MyChart username and password.

## 2024-07-23 NOTE — Progress Notes (Signed)
 Hematology and Oncology Follow Up Visit  [name] [id number] 22-Dec-1945 78 y.o. 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 q day -- maintanence on 09/06/2018 EC ASA 81 mg po q day Gazyva /Brukinsa /Venetoclax -- start on 05/28/2024   Interim History:  [name] is here today for follow-up.  She is doing quite nicely.  As expected, she is responding.  Her white cell count is coming down well.  Her lymphocytes have come down quite well.  There is no adenopathy.  I cannot feel any palpable spleen.  She is doing well on the Brukinsa .  At some point, we will add the venetoclax.  She has had no fever.  She has had no bleeding.  She has had no mouth sores.  There is been no change in bowel or bladder habits.  She does have some knee problems.  She is post have knee surgery a while back but because of COVID this all got postponed.  She has had no rashes.  There has been no pruritus.  She has had no leg swelling.  There is been no tingling in the hands or feet.  Her appetite is doing pretty well.  Overall, I would say that her performance status is probably ECOG 2.    Medications:  Allergies as of 07/23/2024       Reactions   Naprosyn [naproxen] Other (See Comments)   Angioedema   Naproxen Shortness Of Breath, Itching, Swelling   Tizanidine  Shortness Of Breath, Other (See Comments)   dizziness        Medication List        Accurate as of July 23, 2024 10:57 AM. If you have any questions, ask your nurse or doctor.          acetaminophen  650 MG CR tablet Commonly known as: TYLENOL  Take 650 mg by mouth every 8 (eight) hours as needed for pain.   Advair Diskus 250-50 MCG/DOSE Aepb Generic drug: fluticasone -salmeterol Inhale one puff into the  lungs 2 (two) times daily.   albuterol  108 (90 Base) MCG/ACT inhaler Commonly known as: ProAir  HFA Inhale 2 puffs into the lungs every 4 (four) hours as needed for wheezing or shortness of breath.   albuterol  (2.5 MG/3ML) 0.083% nebulizer solution Commonly known as: PROVENTIL  Take 3 mLs (2.5 mg total) by nebulization every 6 (six) hours as needed for wheezing or shortness of breath.   allopurinol  100 MG tablet Commonly known as: Zyloprim  Take 1 tablet (100 mg total) by mouth daily. Please start taking this 2 days before you start the IV medicine.  You only take this for 30 days and no more after that.  Pete   amLODipine  10 MG tablet Commonly known as: NORVASC  Take 10 mg by mouth daily.   Brukinsa  80 MG capsule Generic drug: zanubrutinib  Take 2 capsules (160 mg total) by mouth 2 (two) times daily.   cetirizine  10 MG tablet Commonly known as: ZYRTEC  Take 10 mg by mouth daily.   famciclovir  250 MG tablet Commonly known as: FAMVIR  Take 1 tablet (250 mg total) by mouth daily.   famotidine  20 MG tablet Commonly known as: PEPCID  Take 20 mg by mouth at bedtime. What changed: Another medication with the same name was removed. Continue taking this medication, and follow the directions you see  here. Changed by: Maude JONELLE Crease   ferrous sulfate  324 (65 Fe) MG Tbec Take 1 tablet (325 mg total) by mouth daily.   fluticasone  50 MCG/ACT nasal spray Commonly known as: FLONASE  Place 2 sprays into both nostrils daily.   latanoprost  0.005 % ophthalmic solution Commonly known as: XALATAN  Place 1 drop into both eyes at bedtime.   levothyroxine  50 MCG tablet Commonly known as: SYNTHROID  Take 50 mcg by mouth daily.   meclizine  25 MG tablet Commonly known as: ANTIVERT  Take 25-50 mg by mouth 3 (three) times daily.   memantine 10 MG tablet Commonly known as: NAMENDA Take 10 mg by mouth 2 (two) times daily. What changed: Another medication with the same name was removed. Continue  taking this medication, and follow the directions you see here. Changed by: Maude JONELLE Crease   montelukast  10 MG tablet Commonly known as: SINGULAIR  Take 10 mg by mouth at bedtime.   ondansetron  8 MG tablet Commonly known as: Zofran  Take 1 tablet (8 mg total) by mouth every 8 (eight) hours as needed for nausea or vomiting.   pantoprazole  40 MG tablet Commonly known as: PROTONIX  Take 40 mg by mouth daily.   prochlorperazine  10 MG tablet Commonly known as: COMPAZINE  Take 1 tablet (10 mg total) by mouth every 6 (six) hours as needed for nausea or vomiting.   rOPINIRole  2 MG tablet Commonly known as: REQUIP  Take 2 mg by mouth at bedtime.   traZODone 50 MG tablet Commonly known as: DESYREL Take 50 mg by mouth at bedtime.   venlafaxine  XR 150 MG 24 hr capsule Commonly known as: EFFEXOR -XR Take 150 mg by mouth every morning. What changed: Another medication with the same name was removed. Continue taking this medication, and follow the directions you see here. Changed by: Maude JONELLE Crease   Vitamin D (Ergocalciferol) 1.25 MG (50000 UNIT) Caps capsule Commonly known as: DRISDOL Take 1 capsule by mouth once a week. What changed: Another medication with the same name was removed. Continue taking this medication, and follow the directions you see here. Changed by: Maude JONELLE Crease   Xarelto  10 MG Tabs tablet Generic drug: rivaroxaban  TAKE ONE TABLET BY MOUTH EVERY DAY WITH SUPPER        Allergies:  Allergies  Allergen Reactions   Naprosyn [Naproxen] Other (See Comments)    Angioedema   Naproxen Shortness Of Breath, Itching and Swelling   Tizanidine  Shortness Of Breath and Other (See Comments)    dizziness    Past Medical History, Surgical history, Social history, and Family History were reviewed and updated.  Review of Systems: All other 10 point review of systems is negative.   Physical Exam:  height is 5' 1 (1.549 m) and weight is 140 lb (63.5 kg). Her oral  temperature is 97.9 F (36.6 C). Her blood pressure is 184/76 (abnormal) and her pulse is 96. Her respiration is 16.   Wt Readings from Last 3 Encounters:  07/23/24 140 lb (63.5 kg)  06/25/24 141 lb 1.9 oz (64 kg)  05/22/24 143 lb (64.9 kg)    Ocular: Sclerae unicteric, pupils equal, round and reactive to light Ear-nose-throat: Oropharynx clear, dentition fair Lymphatic: No cervical or supraclavicular adenopathy Lungs no rales or rhonchi, good excursion bilaterally Heart regular rate and rhythm, no murmur appreciated Abd soft, nontender, positive bowel sounds MSK no focal spinal tenderness, no joint edema Neuro: non-focal, well-oriented, appropriate affect Breasts: Deferred   Lab Results  Component Value Date   WBC 4.2 07/23/2024  HGB 14.4 07/23/2024   HCT 43.1 07/23/2024   MCV 92.3 07/23/2024   PLT 205 07/23/2024   Lab Results  Component Value Date   FERRITIN 435 (H) 05/22/2024   IRON 30 05/22/2024   TIBC 234 (L) 05/22/2024   UIBC 204 05/22/2024   IRONPCTSAT 13 05/22/2024   Lab Results  Component Value Date   RETICCTPCT 2.3 05/22/2024   RBC 4.67 07/23/2024   Lab Results  Component Value Date   KPAFRELGTCHN 111.9 (H) 05/22/2024   LAMBDASER 18.5 05/22/2024   KAPLAMBRATIO 6.05 (H) 05/22/2024   Lab Results  Component Value Date   IGGSERUM 741 05/22/2024   IGA 107 05/22/2024   IGMSERUM 255 (H) 05/22/2024   No results found for: STEPHANY CARLOTA BENSON MARKEL EARLA JOANNIE DOC VICK, SPEI   Chemistry      Component Value Date/Time   NA 141 07/23/2024 0947   NA 141 11/03/2015 1117   K 5.2 (H) 07/23/2024 0947   CL 105 07/23/2024 0947   CO2 26 07/23/2024 0947   BUN 11 07/23/2024 0947   BUN 22 11/03/2015 1117   CREATININE 0.96 07/23/2024 0947      Component Value Date/Time   CALCIUM 10.6 (H) 07/23/2024 0947   ALKPHOS 124 07/23/2024 0947   AST 18 07/23/2024 0947   ALT 11 07/23/2024 0947   BILITOT 0.7 07/23/2024 0947        Impression and Plan: [name] is a very pleasant 78 yo caucasian female with history of pulmonary emboli and lower extremity DVT with transiently positive lupus anticoagulant (resolved).  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   Again, she is responding very well by her blood counts.  At some point, we probably will need to do another bone marrow biopsy on her.  I probably would do that maybe sometime in September between cycle 4 and cycle 5 of treatment of the Gazyva .  Right now, I will continue to hold on the venetoclax.  Will plan to get her back in another month.  Maude JONELLE Crease, MD 8/5/202510:57 AM

## 2024-08-01 NOTE — Progress Notes (Signed)
 History of Present Illness The patient is a 78 year old female with a history of CLL, hypothyroidism, gout, and hypertension, presenting with back pain.  She has been experiencing persistent back pain, which has intensified over the past week to the point where it impedes her mobility. The pain radiates from her back to the front and extends down both legs to her ankles. She also reports vertigo, which limits her ability to drive and necessitates the use of a cane for support. Her activities are largely confined to her home, with occasional outings for meals with a group of older individuals who provide transportation. She has noticed bruising across her back and front. The intensity of her pain fluctuates, often starting mildly in the morning and escalating by lunchtime. She has previously found relief from prednisone  and over-the-counter medication, taking two to three tablets at a time. She has previously experienced similar back pain and recalls being prescribed medication that provided some relief, although she cannot remember the specific drug.  Additionally, she reports frequent urination, necessitating bathroom visits 5 to 6 times at night and every 30 to 45 minutes during the day. This is not a new symptom for her. Her sleep is disrupted due to frequent urination.  She continues to take meclizine  for her vertigo, with all her medications refilled about a week ago.  She has Dupuytren's contracture.  Social History: Sleep: She reports sleeping an hour or two at a time, waking up frequently. Living Condition: She prefers to stay at home due to her vertigo and pain.  Vitals:   08/01/24 0930  BP: 120/70  Pulse:   Resp:   Temp:   SpO2:    Gen: Alert, oriented, non toxic, and well hydrated.  No signs of acute distress. HEENT: Normocephalic.  Atraumatic.  PERRLA.   Neck: Supple. No lymphadenopathy Respiratory:  No use of accessory muscles. Cardiovascular: Regular rate and rhythm.    Abdominal:  Soft, non tender, non distended.    Neuro: Cranial nerves intact grossly.  No loss of strength, sensation MS:  Decreased ROM lumbar spine Skin:  No rashes noted Psych: Oriented, alert.  Assessment & Plan  1. Chronic bilateral low back pain with bilateral sciatica  POCT urinalysis dipstick    2. CLL (chronic lymphocytic leukemia) (*)      3. Chronic cough  predniSONE  (DELTASONE ) 20 mg tablet    4. Mild intermittent asthma with acute exacerbation (*)  predniSONE  (DELTASONE ) 20 mg tablet     MDM:   1. Low back pain with radicular symptoms. - Reports severe back pain radiating to the front and down to her ankles, significantly limiting mobility. - Pain has been persistent and worsened over the past week. - Prednisone  has been effective in managing her pain in the past. - Prescription for prednisone  has been sent to the pharmacy.  2. Polyuria. - Reports frequent urination, up to 5-6 times at night and every 30-45 minutes during the day. - Urinalysis shows no signs of infection but indicates mild dehydration. - Mild dehydration likely due to reluctance to drink water  because of frequent urination. - Referral to urology will be considered if polyuria continues to be an issue.  3. Vertigo. - Experiences severe vertigo, limiting ability to drive and perform daily activities. - Currently taking meclizine  for vertigo management. - Recently refilled meclizine  prescription.  Patient  verbalized to me that they understood what their problem is, what they need to do about it, and why it is important that they do it.  The patient/family voices understanding of all medications. No barriers to adherence were noted. Patient is taking all medications as prescribed and is tolerating well.  Plan for follow-up as discussed or as needed if any worsening symptoms or change in condition.

## 2024-08-08 ENCOUNTER — Other Ambulatory Visit: Payer: Self-pay

## 2024-08-08 ENCOUNTER — Other Ambulatory Visit: Payer: Self-pay | Admitting: Pharmacy Technician

## 2024-08-08 NOTE — Progress Notes (Signed)
 Clinical Intervention Note  Clinical Intervention Notes: Patient reported new medication. No drug-drug interactions with Brukinsa  and new prednisone  and famotidine  prescriptions.   Clinical Intervention Outcomes: Prevention of an adverse drug event   [name] Specialty Pharmacist

## 2024-08-08 NOTE — Progress Notes (Signed)
 Specialty Pharmacy Refill Coordination Note  [name] is a 78 y.o. female contacted today regarding refills of specialty medication(s) Zanubrutinib  (Brukinsa )  Spoke with Daughter   Patient requested Delivery   Delivery date: 08/15/24   Verified address: [address]  [place] [facility] [place]   Medication will be filled on 08/14/24.

## 2024-08-14 ENCOUNTER — Other Ambulatory Visit: Payer: Self-pay

## 2024-08-21 ENCOUNTER — Inpatient Hospital Stay: Admitting: Hematology & Oncology

## 2024-08-21 ENCOUNTER — Telehealth: Payer: Self-pay

## 2024-08-21 ENCOUNTER — Inpatient Hospital Stay

## 2024-08-21 MED ORDER — TRAMADOL HCL 50 MG PO TABS: 50.0000 mg | ORAL_TABLET | Freq: Four times a day (QID) | ORAL | 0 refills | Status: DC | PRN

## 2024-08-21 NOTE — Telephone Encounter (Signed)
 Addendum: While on phone with pt daughter she stated that her mother mixed up her Brukinsa  doses and was taking double the amount for 3 days.  Dr. Timmy aware and stated that her white count may drop a little but no other big side effects should be noticed. Pt daughter stated her mother has been fine and they haven't noticed any side effects either.  Pt daughter verbalized understanding that when she is done with this prescription to take 3 days off then restart the prescription to get back on track for dosing. No further needs identified.

## 2024-08-21 NOTE — Telephone Encounter (Signed)
 Pt did not show for her appointment and per Dr. Timmy request this RN called patient to check on her.  This RN spoke with pt daughter who stated the patient has 3 compression fractures and is in too much pain to come into to be seen.  Per daughter pt will be seen by a spine specialist on 08/23/2024 and has not received any pain medication. Pt daughter stated she tried to have her mom go to the ER to have her pain addressed but patient did not want to go. Dr. Timmy aware and stated he will send in a prescription for pain. Pt daughter and patient were very appreciative of help and stated they had rescheduled their appointment.

## 2024-08-28 ENCOUNTER — Inpatient Hospital Stay (HOSPITAL_BASED_OUTPATIENT_CLINIC_OR_DEPARTMENT_OTHER): Admitting: Hematology & Oncology

## 2024-08-28 ENCOUNTER — Inpatient Hospital Stay

## 2024-08-28 ENCOUNTER — Inpatient Hospital Stay: Attending: Hematology & Oncology

## 2024-08-28 ENCOUNTER — Encounter: Payer: Self-pay | Admitting: Hematology & Oncology

## 2024-08-28 VITALS — Ht 61.0 in | Wt 142.0 lb

## 2024-08-28 VITALS — BP 157/81 | HR 112 | Temp 98.4°F | Resp 19

## 2024-08-28 DIAGNOSIS — I2699 Other pulmonary embolism without acute cor pulmonale: Secondary | ICD-10-CM | POA: Diagnosis present

## 2024-08-28 DIAGNOSIS — Z86711 Personal history of pulmonary embolism: Secondary | ICD-10-CM | POA: Diagnosis not present

## 2024-08-28 DIAGNOSIS — D6862 Lupus anticoagulant syndrome: Secondary | ICD-10-CM | POA: Insufficient documentation

## 2024-08-28 DIAGNOSIS — Z79899 Other long term (current) drug therapy: Secondary | ICD-10-CM | POA: Diagnosis not present

## 2024-08-28 DIAGNOSIS — Z7901 Long term (current) use of anticoagulants: Secondary | ICD-10-CM | POA: Insufficient documentation

## 2024-08-28 DIAGNOSIS — Z886 Allergy status to analgesic agent status: Secondary | ICD-10-CM | POA: Insufficient documentation

## 2024-08-28 DIAGNOSIS — Z5112 Encounter for antineoplastic immunotherapy: Secondary | ICD-10-CM | POA: Insufficient documentation

## 2024-08-28 DIAGNOSIS — Z79620 Long term (current) use of immunosuppressive biologic: Secondary | ICD-10-CM | POA: Diagnosis not present

## 2024-08-28 DIAGNOSIS — Z86718 Personal history of other venous thrombosis and embolism: Secondary | ICD-10-CM | POA: Diagnosis not present

## 2024-08-28 DIAGNOSIS — M4850XA Collapsed vertebra, not elsewhere classified, site unspecified, initial encounter for fracture: Secondary | ICD-10-CM | POA: Diagnosis not present

## 2024-08-28 DIAGNOSIS — C9110 Chronic lymphocytic leukemia of B-cell type not having achieved remission: Secondary | ICD-10-CM

## 2024-08-28 DIAGNOSIS — Z7982 Long term (current) use of aspirin: Secondary | ICD-10-CM | POA: Diagnosis not present

## 2024-08-28 DIAGNOSIS — R161 Splenomegaly, not elsewhere classified: Secondary | ICD-10-CM | POA: Insufficient documentation

## 2024-08-28 DIAGNOSIS — E039 Hypothyroidism, unspecified: Secondary | ICD-10-CM

## 2024-08-28 LAB — CMP (CANCER CENTER ONLY)
ALT: 12 U/L (ref 0–44)
AST: 18 U/L (ref 15–41)
Albumin: 3.9 g/dL (ref 3.5–5.0)
Alkaline Phosphatase: 147 U/L — ABNORMAL HIGH (ref 38–126)
Anion gap: 13 (ref 5–15)
BUN: 14 mg/dL (ref 8–23)
CO2: 25 mmol/L (ref 22–32)
Calcium: 9.9 mg/dL (ref 8.9–10.3)
Chloride: 102 mmol/L (ref 98–111)
Creatinine: 0.78 mg/dL (ref 0.44–1.00)
GFR, Estimated: >60 mL/min
Glucose, Bld: 130 mg/dL — ABNORMAL HIGH (ref 70–99)
Potassium: 4.3 mmol/L (ref 3.5–5.1)
Sodium: 139 mmol/L (ref 135–145)
Total Bilirubin: 0.6 mg/dL (ref 0.0–1.2)
Total Protein: 6.7 g/dL (ref 6.5–8.1)

## 2024-08-28 LAB — CBC WITH DIFFERENTIAL (CANCER CENTER ONLY)
Abs Immature Granulocytes: 0.18 K/uL — ABNORMAL HIGH (ref 0.00–0.07)
Basophils Absolute: 0.1 K/uL (ref 0.0–0.1)
Basophils Relative: 1 %
Eosinophils Absolute: 0.2 K/uL (ref 0.0–0.5)
Eosinophils Relative: 3 %
HCT: 37.6 % (ref 36.0–46.0)
Hemoglobin: 12.8 g/dL (ref 12.0–15.0)
Immature Granulocytes: 3 %
Lymphocytes Relative: 21 %
Lymphs Abs: 1.3 K/uL (ref 0.7–4.0)
MCH: 31.6 pg (ref 26.0–34.0)
MCHC: 34 g/dL (ref 30.0–36.0)
MCV: 92.8 fL (ref 80.0–100.0)
Monocytes Absolute: 0.7 K/uL (ref 0.1–1.0)
Monocytes Relative: 11 %
Neutro Abs: 3.6 K/uL (ref 1.7–7.7)
Neutrophils Relative %: 61 %
Platelet Count: 355 K/uL (ref 150–400)
RBC: 4.05 MIL/uL (ref 3.87–5.11)
RDW: 14 % (ref 11.5–15.5)
WBC Count: 5.9 K/uL (ref 4.0–10.5)
nRBC: 0 % (ref 0.0–0.2)

## 2024-08-28 LAB — TSH: TSH: 1.56 u[IU]/mL (ref 0.350–4.500)

## 2024-08-28 LAB — IRON AND IRON BINDING CAPACITY (CC-WL,HP ONLY)
Iron: 37 ug/dL (ref 28–170)
Saturation Ratios: 14 % (ref 10.4–31.8)
TIBC: 266 ug/dL (ref 250–450)
UIBC: 229 ug/dL

## 2024-08-28 LAB — SAVE SMEAR(SSMR), FOR PROVIDER SLIDE REVIEW

## 2024-08-28 LAB — FERRITIN: Ferritin: 1360 ng/mL — ABNORMAL HIGH (ref 11–307)

## 2024-08-28 MED ORDER — ACETAMINOPHEN 325 MG PO TABS
650.0000 mg | ORAL_TABLET | Freq: Once | ORAL | Status: AC
  Administered 2024-08-28: 650 mg via ORAL
  Filled 2024-08-28: qty 2

## 2024-08-28 MED ORDER — SODIUM CHLORIDE 0.9 % IV SOLN
1000.0000 mg | Freq: Once | INTRAVENOUS | Status: AC
  Administered 2024-08-28: 1000 mg via INTRAVENOUS
  Filled 2024-08-28: qty 40

## 2024-08-28 MED ORDER — HYDROMORPHONE HCL 1 MG/ML IJ SOLN: 1.0000 mg | INTRAMUSCULAR | Status: DC | PRN

## 2024-08-28 MED ORDER — KETOROLAC TROMETHAMINE 15 MG/ML IJ SOLN: 15.0000 mg | Freq: Once | INTRAMUSCULAR | Status: DC

## 2024-08-28 MED ORDER — KETOROLAC TROMETHAMINE 15 MG/ML IJ SOLN
15.0000 mg | Freq: Once | INTRAMUSCULAR | Status: AC
  Administered 2024-08-28: 15 mg via INTRAVENOUS
  Filled 2024-08-28: qty 1

## 2024-08-28 MED ORDER — SODIUM CHLORIDE 0.9 % IV SOLN: INTRAVENOUS | Status: DC

## 2024-08-28 MED ORDER — HYDROMORPHONE HCL 1 MG/ML IJ SOLN
1.0000 mg | Freq: Once | INTRAMUSCULAR | Status: DC
  Filled 2024-08-28: qty 1

## 2024-08-28 MED ORDER — CETIRIZINE HCL 10 MG/ML IV SOLN
10.0000 mg | Freq: Once | INTRAVENOUS | Status: AC
  Administered 2024-08-28: 10 mg via INTRAVENOUS
  Filled 2024-08-28: qty 1

## 2024-08-28 MED ORDER — DEXAMETHASONE SODIUM PHOSPHATE 10 MG/ML IJ SOLN
10.0000 mg | Freq: Once | INTRAMUSCULAR | Status: AC
  Administered 2024-08-28: 10 mg via INTRAVENOUS
  Filled 2024-08-28: qty 1

## 2024-08-28 MED ORDER — HYDROMORPHONE HCL 1 MG/ML IJ SOLN
1.0000 mg | Freq: Once | INTRAMUSCULAR | Status: AC
  Administered 2024-08-28: 1 mg via INTRAVENOUS
  Filled 2024-08-28: qty 1

## 2024-08-28 NOTE — Patient Instructions (Signed)
 [facility] - A DEPT OF [facility]  Discharge Instructions: Thank you for choosing [facility] to provide your oncology and hematology care.   If you have a lab appointment with the Cancer Center, please go directly to the Cancer Center and check in at the registration area.  Wear comfortable clothing and clothing appropriate for easy access to any Portacath or PICC line.   We strive to give you quality time with your provider. You may need to reschedule your appointment if you arrive late (15 or more minutes).  Arriving late affects you and other patients whose appointments are after yours.  Also, if you miss three or more appointments without notifying the office, you may be dismissed from the clinic at the provider's discretion.      For prescription refill requests, have your pharmacy contact our office and allow 72 hours for refills to be completed.    Today you received the following chemotherapy and/or immunotherapy agents gazyva       To help prevent nausea and vomiting after your treatment, we encourage you to take your nausea medication as directed.  BELOW ARE SYMPTOMS THAT SHOULD BE REPORTED IMMEDIATELY: *FEVER GREATER THAN 100.4 F (38 C) OR HIGHER *CHILLS OR SWEATING *NAUSEA AND VOMITING THAT IS NOT CONTROLLED WITH YOUR NAUSEA MEDICATION *UNUSUAL SHORTNESS OF BREATH *UNUSUAL BRUISING OR BLEEDING *URINARY PROBLEMS (pain or burning when urinating, or frequent urination) *BOWEL PROBLEMS (unusual diarrhea, constipation, pain near the anus) TENDERNESS IN MOUTH AND THROAT WITH OR WITHOUT PRESENCE OF ULCERS (sore throat, sores in mouth, or a toothache) UNUSUAL RASH, SWELLING OR PAIN  UNUSUAL VAGINAL DISCHARGE OR ITCHING   Items with * indicate a potential emergency and should be followed up as soon as possible or go to the Emergency Department if any problems should occur.  Please show the CHEMOTHERAPY ALERT CARD or IMMUNOTHERAPY ALERT  CARD at check-in to the Emergency Department and triage nurse. Should you have questions after your visit or need to cancel or reschedule your appointment, please contact [facility] - A DEPT OF [facility]  [phone] and follow the prompts.  Office hours are 8:00 a.m. to 4:30 p.m. Monday - Friday. Please note that voicemails left after 4:00 p.m. may not be returned until the following business day.  We are closed weekends and major holidays. You have access to a nurse at all times for urgent questions. Please call the main number to the clinic [phone] and follow the prompts.  For any non-urgent questions, you may also contact your provider using MyChart. We now offer e-Visits for anyone 82 and older to request care online for non-urgent symptoms. For details visit [email or web address].   Also download the MyChart app! Go to the app store, search MyChart, open the app, select [facility], and log in with your MyChart username and password.

## 2024-08-28 NOTE — Progress Notes (Signed)
 Hematology and Oncology Follow Up Visit  [name] [id number] October 20, 1946 78 y.o. 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 q day -- maintanence on 09/06/2018 EC ASA 81 mg po q day Gazyva /Brukinsa /Venetoclax -- start on 05/28/2024   Interim History:  [name] is here today for follow-up.  Unfortunately, she is really in tough shape today.  She has compression fractures in her back.  She has a new 1 at L2.  She has 1 at T5 and T3.  It does not sound like anything can be done for the thoracic compression fractures.  They are going to see about maybe doing kyphoplasty at L2.  She has a back brace on..  She is having quite a bit of pain.  I told her that she can certainly take her Ultram  that we have given her.  She has done well with her CLL.  She has responded as expected..  I do not want to start venetoclax right now just because of everything going on with her back.  She is on Brukinsa  and doing well with that..  She has had no nausea or vomiting.  She has had no change in bowel or bladder habits.  She has had no bleeding.  Has been no leg swelling.  She has had no rashes..  Overall, I would say that her performance status is probably ECOG 2.   Medications:  Allergies as of 08/28/2024       Reactions   Naprosyn [naproxen] Other (See Comments)   Angioedema   Naproxen Shortness Of Breath, Itching, Swelling   Tizanidine  Shortness Of Breath, Other (See Comments)   dizziness        Medication List        Accurate as of August 28, 2024  8:06 AM. If you have any questions, ask your nurse or doctor.          acetaminophen  650 MG CR tablet Commonly known as: TYLENOL  Take 650 mg by mouth every 8 (eight) hours as needed for pain.   Advair Diskus  250-50 MCG/DOSE Aepb Generic drug: fluticasone -salmeterol Inhale one puff into the lungs 2 (two) times daily.   albuterol  108 (90 Base) MCG/ACT inhaler Commonly known as: ProAir  HFA Inhale 2 puffs into the lungs every 4 (four) hours as needed for wheezing or shortness of breath.   albuterol  (2.5 MG/3ML) 0.083% nebulizer solution Commonly known as: PROVENTIL  Take 3 mLs (2.5 mg total) by nebulization every 6 (six) hours as needed for wheezing or shortness of breath.   allopurinol  100 MG tablet Commonly known as: Zyloprim  Take 1 tablet (100 mg total) by mouth daily. Please start taking this 2 days before you start the IV medicine.  You only take this for 30 days and no more after that.  Pete   amLODipine  10 MG tablet Commonly known as: NORVASC  Take 10 mg by mouth daily.   Brukinsa  80 MG capsule Generic drug: zanubrutinib  Take 2 capsules (160 mg total) by mouth 2 (two) times daily.   cetirizine  10 MG tablet Commonly known as: ZYRTEC  Take 10 mg by mouth daily.   famciclovir  250 MG tablet Commonly known as: FAMVIR  Take 1 tablet (250 mg total) by mouth daily.   famotidine  20 MG tablet Commonly known as: PEPCID  Take 20 mg by mouth  at bedtime.   ferrous sulfate  324 (65 Fe) MG Tbec Take 1 tablet (325 mg total) by mouth daily.   fluticasone  50 MCG/ACT nasal spray Commonly known as: FLONASE  Place 2 sprays into both nostrils daily.   latanoprost  0.005 % ophthalmic solution Commonly known as: XALATAN  Place 1 drop into both eyes at bedtime.   levothyroxine  50 MCG tablet Commonly known as: SYNTHROID  Take 50 mcg by mouth daily.   meclizine  25 MG tablet Commonly known as: ANTIVERT  Take 25-50 mg by mouth 3 (three) times daily.   memantine 10 MG tablet Commonly known as: NAMENDA Take 10 mg by mouth 2 (two) times daily.   montelukast  10 MG tablet Commonly known as: SINGULAIR  Take 10 mg by mouth at bedtime.   ondansetron  8 MG tablet Commonly known as: Zofran  Take 1 tablet  (8 mg total) by mouth every 8 (eight) hours as needed for nausea or vomiting.   pantoprazole  40 MG tablet Commonly known as: PROTONIX  Take 40 mg by mouth daily.   prochlorperazine  10 MG tablet Commonly known as: COMPAZINE  Take 1 tablet (10 mg total) by mouth every 6 (six) hours as needed for nausea or vomiting.   rOPINIRole  2 MG tablet Commonly known as: REQUIP  Take 2 mg by mouth at bedtime.   traMADol  50 MG tablet Commonly known as: ULTRAM  Take 1 tablet (50 mg total) by mouth every 6 (six) hours as needed.   traZODone 50 MG tablet Commonly known as: DESYREL Take 50 mg by mouth at bedtime.   venlafaxine  XR 150 MG 24 hr capsule Commonly known as: EFFEXOR -XR Take 150 mg by mouth every morning.   Vitamin D (Ergocalciferol) 1.25 MG (50000 UNIT) Caps capsule Commonly known as: DRISDOL Take 1 capsule by mouth once a week.   Xarelto  10 MG Tabs tablet Generic drug: rivaroxaban  TAKE ONE TABLET BY MOUTH EVERY DAY WITH SUPPER        Allergies:  Allergies  Allergen Reactions   Naprosyn [Naproxen] Other (See Comments)    Angioedema   Naproxen Shortness Of Breath, Itching and Swelling   Tizanidine  Shortness Of Breath and Other (See Comments)    dizziness    Past Medical History, Surgical history, Social history, and Family History were reviewed and updated.  Review of Systems: All other 10 point review of systems is negative.   Physical Exam:  Vital signs show temperature of 98.4.  Pulse 84.  Blood pressure 149/66.  Weight is 142 pounds.  Wt Readings from Last 3 Encounters:  07/23/24 140 lb (63.5 kg)  06/25/24 141 lb 1.9 oz (64 kg)  05/22/24 143 lb (64.9 kg)    Physical Exam Vitals reviewed.  HENT:     Head: Normocephalic and atraumatic.  Eyes:     Pupils: Pupils are equal, round, and reactive to light.  Cardiovascular:     Rate and Rhythm: Normal rate and regular rhythm.     Heart sounds: Normal heart sounds.  Pulmonary:     Effort: Pulmonary effort is  normal.     Breath sounds: Normal breath sounds.  Abdominal:     General: Bowel sounds are normal.     Palpations: Abdomen is soft.  Musculoskeletal:        General: No tenderness or deformity. Normal range of motion.     Cervical back: Normal range of motion.     Comments: Acute exam shows tenderness to the back.  She has back brace on.  She has very limited mobility.  Lymphadenopathy:  Cervical: No cervical adenopathy.  Skin:    General: Skin is warm and dry.     Findings: No erythema or rash.  Neurological:     Mental Status: She is alert and oriented to person, place, and time.  Psychiatric:        Behavior: Behavior normal.        Thought Content: Thought content normal.        Judgment: Judgment normal.      Lab Results  Component Value Date   WBC 4.2 07/23/2024   HGB 14.4 07/23/2024   HCT 43.1 07/23/2024   MCV 92.3 07/23/2024   PLT 205 07/23/2024   Lab Results  Component Value Date   FERRITIN 435 (H) 05/22/2024   IRON 30 05/22/2024   TIBC 234 (L) 05/22/2024   UIBC 204 05/22/2024   IRONPCTSAT 13 05/22/2024   Lab Results  Component Value Date   RETICCTPCT 2.3 05/22/2024   RBC 4.67 07/23/2024   Lab Results  Component Value Date   KPAFRELGTCHN 111.9 (H) 05/22/2024   LAMBDASER 18.5 05/22/2024   KAPLAMBRATIO 6.05 (H) 05/22/2024   Lab Results  Component Value Date   IGGSERUM 741 05/22/2024   IGA 107 05/22/2024   IGMSERUM 255 (H) 05/22/2024   No results found for: STEPHANY CARLOTA BENSON MARKEL EARLA JOANNIE DOC VICK, SPEI   Chemistry      Component Value Date/Time   NA 141 07/23/2024 0947   NA 141 11/03/2015 1117   K 5.2 (H) 07/23/2024 0947   CL 105 07/23/2024 0947   CO2 26 07/23/2024 0947   BUN 11 07/23/2024 0947   BUN 22 11/03/2015 1117   CREATININE 0.96 07/23/2024 0947      Component Value Date/Time   CALCIUM 10.6 (H) 07/23/2024 0947   ALKPHOS 124 07/23/2024 0947   AST 18 07/23/2024 0947   ALT 11 07/23/2024  0947   BILITOT 0.7 07/23/2024 0947       Impression and Plan: [name] is a very pleasant 78 yo caucasian female with history of pulmonary emboli and lower extremity DVT with transiently positive lupus anticoagulant (resolved).  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   The real problem now is her back.  Hopefully, this will be able to be attended to.  Hopefully she will be able to have kyphoplasty.  We will go ahead with her Gazyva  today.  I will go ahead and give her some Dilaudid  and Toradol .  I think this would be reasonable to try to help with her pain.  She might need some muscle relaxers also.  We will go ahead and plan to get her back in another month.   Maude JONELLE Crease, MD 9/10/20258:06 AM

## 2024-08-29 ENCOUNTER — Other Ambulatory Visit: Payer: Self-pay

## 2024-08-29 LAB — T4: T4, Total: 10.8 ug/dL (ref 4.5–12.0)

## 2024-08-29 NOTE — Progress Notes (Signed)
 Specialty Pharmacy Ongoing Clinical Assessment Note  [name] is a 78 y.o. female who is being followed by the specialty pharmacy service for RxSp Oncology   Patient's specialty medication(s) reviewed today: Zanubrutinib  (Brukinsa )   Missed doses in the last 4 weeks: 0   Patient/Caregiver did not have any additional questions or concerns.   Therapeutic benefit summary: Patient is achieving benefit   Adverse events/side effects summary: No adverse events/side effects   Patient's therapy is appropriate to: Continue    Goals Addressed             This Visit's Progress    Maintain optimal adherence to therapy   On track    Patient is on track. Patient will maintain adherence.         Follow up: 3 months  [name] Specialty Pharmacist

## 2024-09-06 ENCOUNTER — Other Ambulatory Visit: Payer: Self-pay

## 2024-09-10 ENCOUNTER — Other Ambulatory Visit: Payer: Self-pay

## 2024-09-10 ENCOUNTER — Other Ambulatory Visit (HOSPITAL_COMMUNITY): Payer: Self-pay

## 2024-09-10 NOTE — Progress Notes (Signed)
 Specialty Pharmacy Refill Coordination Note  [name] is a 78 y.o. female contacted today regarding refills of specialty medication(s) Zanubrutinib  (Brukinsa )  Spoke with patient's daughter  Patient requested Delivery   Delivery date: 09/16/24   Verified address: [address]  [facility] [place]   Medication will be filled on 09.26.25.

## 2024-09-12 ENCOUNTER — Other Ambulatory Visit: Payer: Self-pay

## 2024-09-17 ENCOUNTER — Inpatient Hospital Stay

## 2024-09-17 ENCOUNTER — Inpatient Hospital Stay: Admitting: Hematology & Oncology

## 2024-09-24 ENCOUNTER — Inpatient Hospital Stay

## 2024-09-24 ENCOUNTER — Other Ambulatory Visit: Payer: Self-pay

## 2024-09-24 ENCOUNTER — Encounter: Payer: Self-pay | Admitting: Hematology & Oncology

## 2024-09-24 ENCOUNTER — Inpatient Hospital Stay: Admitting: Hematology & Oncology

## 2024-09-24 ENCOUNTER — Inpatient Hospital Stay: Attending: Hematology & Oncology

## 2024-09-24 VITALS — BP 157/76 | HR 100 | Temp 98.6°F | Resp 19

## 2024-09-24 VITALS — BP 129/61 | HR 92 | Temp 97.7°F | Resp 19 | Ht 61.0 in | Wt 138.0 lb

## 2024-09-24 DIAGNOSIS — Z5112 Encounter for antineoplastic immunotherapy: Secondary | ICD-10-CM | POA: Diagnosis present

## 2024-09-24 DIAGNOSIS — M4800 Spinal stenosis, site unspecified: Secondary | ICD-10-CM | POA: Insufficient documentation

## 2024-09-24 DIAGNOSIS — Z86711 Personal history of pulmonary embolism: Secondary | ICD-10-CM | POA: Diagnosis not present

## 2024-09-24 DIAGNOSIS — Z79899 Other long term (current) drug therapy: Secondary | ICD-10-CM | POA: Insufficient documentation

## 2024-09-24 DIAGNOSIS — Q928 Other specified trisomies and partial trisomies of autosomes: Secondary | ICD-10-CM | POA: Insufficient documentation

## 2024-09-24 DIAGNOSIS — R2242 Localized swelling, mass and lump, left lower limb: Secondary | ICD-10-CM | POA: Diagnosis not present

## 2024-09-24 DIAGNOSIS — R5383 Other fatigue: Secondary | ICD-10-CM | POA: Diagnosis not present

## 2024-09-24 DIAGNOSIS — Z886 Allergy status to analgesic agent status: Secondary | ICD-10-CM | POA: Diagnosis not present

## 2024-09-24 DIAGNOSIS — Z86718 Personal history of other venous thrombosis and embolism: Secondary | ICD-10-CM | POA: Insufficient documentation

## 2024-09-24 DIAGNOSIS — Z7982 Long term (current) use of aspirin: Secondary | ICD-10-CM | POA: Diagnosis not present

## 2024-09-24 DIAGNOSIS — D6862 Lupus anticoagulant syndrome: Secondary | ICD-10-CM | POA: Insufficient documentation

## 2024-09-24 DIAGNOSIS — C9110 Chronic lymphocytic leukemia of B-cell type not having achieved remission: Secondary | ICD-10-CM

## 2024-09-24 DIAGNOSIS — I2699 Other pulmonary embolism without acute cor pulmonale: Secondary | ICD-10-CM | POA: Diagnosis present

## 2024-09-24 DIAGNOSIS — R161 Splenomegaly, not elsewhere classified: Secondary | ICD-10-CM | POA: Insufficient documentation

## 2024-09-24 DIAGNOSIS — Z7901 Long term (current) use of anticoagulants: Secondary | ICD-10-CM | POA: Diagnosis not present

## 2024-09-24 DIAGNOSIS — Z79620 Long term (current) use of immunosuppressive biologic: Secondary | ICD-10-CM | POA: Diagnosis not present

## 2024-09-24 DIAGNOSIS — M549 Dorsalgia, unspecified: Secondary | ICD-10-CM | POA: Diagnosis not present

## 2024-09-24 DIAGNOSIS — M4850XA Collapsed vertebra, not elsewhere classified, site unspecified, initial encounter for fracture: Secondary | ICD-10-CM

## 2024-09-24 LAB — CBC WITH DIFFERENTIAL (CANCER CENTER ONLY)
Abs Immature Granulocytes: 0.11 K/uL — ABNORMAL HIGH (ref 0.00–0.07)
Basophils Absolute: 0.1 K/uL (ref 0.0–0.1)
Basophils Relative: 2 %
Eosinophils Absolute: 0.2 K/uL (ref 0.0–0.5)
Eosinophils Relative: 3 %
HCT: 40.2 % (ref 36.0–46.0)
Hemoglobin: 13.4 g/dL (ref 12.0–15.0)
Immature Granulocytes: 2 %
Lymphocytes Relative: 22 %
Lymphs Abs: 1.4 K/uL (ref 0.7–4.0)
MCH: 31.5 pg (ref 26.0–34.0)
MCHC: 33.3 g/dL (ref 30.0–36.0)
MCV: 94.4 fL (ref 80.0–100.0)
Monocytes Absolute: 0.5 K/uL (ref 0.1–1.0)
Monocytes Relative: 7 %
Neutro Abs: 4 K/uL (ref 1.7–7.7)
Neutrophils Relative %: 64 %
Platelet Count: 302 K/uL (ref 150–400)
RBC: 4.26 MIL/uL (ref 3.87–5.11)
RDW: 14.2 % (ref 11.5–15.5)
WBC Count: 6.2 K/uL (ref 4.0–10.5)
nRBC: 0 % (ref 0.0–0.2)

## 2024-09-24 LAB — CMP (CANCER CENTER ONLY)
ALT: 11 U/L (ref 0–44)
AST: 22 U/L (ref 15–41)
Albumin: 4.2 g/dL (ref 3.5–5.0)
Alkaline Phosphatase: 177 U/L — ABNORMAL HIGH (ref 38–126)
Anion gap: 13 (ref 5–15)
BUN: 11 mg/dL (ref 8–23)
CO2: 24 mmol/L (ref 22–32)
Calcium: 10.3 mg/dL (ref 8.9–10.3)
Chloride: 103 mmol/L (ref 98–111)
Creatinine: 1.01 mg/dL — ABNORMAL HIGH (ref 0.44–1.00)
GFR, Estimated: 57 mL/min — ABNORMAL LOW (ref >60)
Glucose, Bld: 118 mg/dL — ABNORMAL HIGH (ref 70–99)
Potassium: 4.4 mmol/L (ref 3.5–5.1)
Sodium: 140 mmol/L (ref 135–145)
Total Bilirubin: 0.5 mg/dL (ref 0.0–1.2)
Total Protein: 6.8 g/dL (ref 6.5–8.1)

## 2024-09-24 LAB — LACTATE DEHYDROGENASE: LDH: 300 U/L — ABNORMAL HIGH (ref 98–192)

## 2024-09-24 LAB — SAVE SMEAR(SSMR), FOR PROVIDER SLIDE REVIEW

## 2024-09-24 MED ORDER — CETIRIZINE HCL 10 MG/ML IV SOLN
10.0000 mg | Freq: Once | INTRAVENOUS | Status: AC
  Administered 2024-09-24: 10 mg via INTRAVENOUS
  Filled 2024-09-24: qty 1

## 2024-09-24 MED ORDER — ACETAMINOPHEN 325 MG PO TABS
650.0000 mg | ORAL_TABLET | Freq: Once | ORAL | Status: AC
  Administered 2024-09-24: 650 mg via ORAL
  Filled 2024-09-24: qty 2

## 2024-09-24 MED ORDER — SODIUM CHLORIDE 0.9 % IV SOLN: INTRAVENOUS | Status: DC

## 2024-09-24 MED ORDER — SODIUM CHLORIDE 0.9 % IV SOLN
1000.0000 mg | Freq: Once | INTRAVENOUS | Status: AC
  Administered 2024-09-24: 1000 mg via INTRAVENOUS
  Filled 2024-09-24: qty 40

## 2024-09-24 MED ORDER — DEXAMETHASONE SODIUM PHOSPHATE 10 MG/ML IJ SOLN
10.0000 mg | Freq: Once | INTRAMUSCULAR | Status: AC
  Administered 2024-09-24: 10 mg via INTRAVENOUS
  Filled 2024-09-24: qty 1

## 2024-09-24 NOTE — Progress Notes (Signed)
 Hematology and Oncology Follow Up Visit  [name] [id number] 1946/07/03 78 y.o. 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 q day -- maintanence on 09/06/2018 EC ASA 81 mg po q day Gazyva /Brukinsa /Venetoclax -- start on 05/28/2024   Interim History:  [name] is here today for follow-up.  She has some problems with her back.  She still having a lot of pain in the back..  She actually underwent an MRI.  This was done on 09/16/2024.  The MRI showed that she had disc bulges.  She had some foraminal stenosis.  They also mention that there is some concerning infiltrative marrow lesion which we certainly know about because she has CLL.  I have a hard time believing that all the pain is from this infiltrative marrow issue.  Again she has had this for quite a while and never has had problems with back pain.  I will have to go ahead and get a PET scan on her.  I think was also interesting is that in the left lower leg, she has this subcutaneous nodule.  This is in the upper part of the lower leg.  Again I am not sure exactly what this is.  I suppose that we might be looking at something that is more aggressive than CLL.  I suppose that she could always be having some kind of transformation to a high-grade lymphoma.  Overall, I think that she has responded well to treatment.  She has had no problems with the Brukinsa .  We have not yet started the venetoclax.  She is in a back brace right now.  Her appetite is okay.  She has had no nausea or vomiting.  She has had no fever.  There is been no bleeding.  She has had no flareups of her asthma.  Overall, I would have to say that her performance status is probably ECOG 2.    Medications:  Allergies as of  09/24/2024       Reactions   Naprosyn [naproxen] Other (See Comments)   Angioedema   Naproxen Shortness Of Breath, Itching, Swelling   Tizanidine  Shortness Of Breath, Other (See Comments)   dizziness        Medication List        Accurate as of September 24, 2024  8:52 AM. If you have any questions, ask your nurse or doctor.          acetaminophen  650 MG CR tablet Commonly known as: TYLENOL  Take 650 mg by mouth every 8 (eight) hours as needed for pain.   Advair Diskus 250-50 MCG/DOSE Aepb Generic drug: fluticasone -salmeterol Inhale one puff into the lungs 2 (two) times daily.   albuterol  108 (90 Base) MCG/ACT inhaler Commonly known as: ProAir  HFA Inhale 2 puffs into the lungs every 4 (four) hours as needed for wheezing or shortness of breath.   albuterol  (2.5 MG/3ML) 0.083% nebulizer solution Commonly known as: PROVENTIL  Take 3 mLs (2.5 mg total) by nebulization every 6 (six) hours as needed for wheezing or shortness of breath.   allopurinol  100 MG tablet Commonly known as: Zyloprim  Take 1 tablet (100 mg total) by mouth daily. Please start taking this 2 days before you start the IV medicine.  You only take this for 30 days and no more after  that.  Jeralyn   amLODipine  10 MG tablet Commonly known as: NORVASC  Take 10 mg by mouth daily.   Brukinsa  80 MG capsule Generic drug: zanubrutinib  Take 2 capsules (160 mg total) by mouth 2 (two) times daily.   cetirizine  10 MG tablet Commonly known as: ZYRTEC  Take 10 mg by mouth daily.   DULoxetine 30 MG capsule Commonly known as: CYMBALTA Take by mouth 2 (two) times daily.   famciclovir  250 MG tablet Commonly known as: FAMVIR  Take 1 tablet (250 mg total) by mouth daily.   famotidine  20 MG tablet Commonly known as: PEPCID  Take 20 mg by mouth at bedtime.   fluticasone  50 MCG/ACT nasal spray Commonly known as: FLONASE  Place 2 sprays into both nostrils daily.   latanoprost  0.005 % ophthalmic solution Commonly known as:  XALATAN  Place 1 drop into both eyes at bedtime.   levothyroxine  50 MCG tablet Commonly known as: SYNTHROID  Take 50 mcg by mouth daily.   meclizine  25 MG tablet Commonly known as: ANTIVERT  Take 25-50 mg by mouth 3 (three) times daily.   memantine 10 MG tablet Commonly known as: NAMENDA Take 10 mg by mouth 2 (two) times daily.   ondansetron  8 MG tablet Commonly known as: Zofran  Take 1 tablet (8 mg total) by mouth every 8 (eight) hours as needed for nausea or vomiting.   pantoprazole  40 MG tablet Commonly known as: PROTONIX  Take 40 mg by mouth daily.   prochlorperazine  10 MG tablet Commonly known as: COMPAZINE  Take 1 tablet (10 mg total) by mouth every 6 (six) hours as needed for nausea or vomiting.   rOPINIRole  2 MG tablet Commonly known as: REQUIP  Take 2 mg by mouth at bedtime.   traMADol  50 MG tablet Commonly known as: ULTRAM  Take 1 tablet (50 mg total) by mouth every 6 (six) hours as needed.   traZODone 50 MG tablet Commonly known as: DESYREL Take 50 mg by mouth at bedtime.   venlafaxine  XR 150 MG 24 hr capsule Commonly known as: EFFEXOR -XR Take 150 mg by mouth every morning.   Vitamin D (Ergocalciferol) 1.25 MG (50000 UNIT) Caps capsule Commonly known as: DRISDOL Take 1 capsule by mouth once a week.   Xarelto  10 MG Tabs tablet Generic drug: rivaroxaban  TAKE ONE TABLET BY MOUTH EVERY DAY WITH SUPPER        Allergies:  Allergies  Allergen Reactions   Naprosyn [Naproxen] Other (See Comments)    Angioedema   Naproxen Shortness Of Breath, Itching and Swelling   Tizanidine  Shortness Of Breath and Other (See Comments)    dizziness    Past Medical History, Surgical history, Social history, and Family History were reviewed and updated.  Review of Systems: Review of Systems  Constitutional:  Positive for malaise/fatigue.  HENT: Negative.    Eyes: Negative.   Respiratory: Negative.    Cardiovascular: Negative.   Gastrointestinal: Negative.    Genitourinary: Negative.   Musculoskeletal:  Positive for back pain.  Skin: Negative.   Neurological: Negative.   Endo/Heme/Allergies: Negative.   Psychiatric/Behavioral: Negative.       Physical Exam:  Vital signs show temperature of 97.7.  Pulse 92.  Blood pressure 129/61.  Weight is 138 pounds.     Wt Readings from Last 3 Encounters:  09/24/24 138 lb (62.6 kg)  08/28/24 142 lb (64.4 kg)  07/23/24 140 lb (63.5 kg)    Physical Exam Vitals reviewed.  HENT:     Head: Normocephalic and atraumatic.  Eyes:     Pupils: Pupils are  equal, round, and reactive to light.  Cardiovascular:     Rate and Rhythm: Normal rate and regular rhythm.     Heart sounds: Normal heart sounds.  Pulmonary:     Effort: Pulmonary effort is normal.     Breath sounds: Normal breath sounds.  Abdominal:     General: Bowel sounds are normal.     Palpations: Abdomen is soft.  Musculoskeletal:        General: No tenderness or deformity. Normal range of motion.     Cervical back: Normal range of motion.     Comments: Back exam shows tenderness to the back.  She has back brace on.  She has very limited mobility.  In the left lower leg, she does have a subcutaneous nodule that is mobile.  It is nontender.  It is in the upper part of the left lower leg.  It is medial to the tibia.  It probably measures about 2 cm.  Lymphadenopathy:     Cervical: No cervical adenopathy.  Skin:    General: Skin is warm and dry.     Findings: No erythema or rash.  Neurological:     Mental Status: She is alert and oriented to person, place, and time.  Psychiatric:        Behavior: Behavior normal.        Thought Content: Thought content normal.        Judgment: Judgment normal.      Lab Results  Component Value Date   WBC 6.2 09/24/2024   HGB 13.4 09/24/2024   HCT 40.2 09/24/2024   MCV 94.4 09/24/2024   PLT 302 09/24/2024   Lab Results  Component Value Date   FERRITIN 1,360 (H) 08/28/2024   IRON 37  08/28/2024   TIBC 266 08/28/2024   UIBC 229 08/28/2024   IRONPCTSAT 14 08/28/2024   Lab Results  Component Value Date   RETICCTPCT 2.3 05/22/2024   RBC 4.26 09/24/2024   Lab Results  Component Value Date   KPAFRELGTCHN 111.9 (H) 05/22/2024   LAMBDASER 18.5 05/22/2024   KAPLAMBRATIO 6.05 (H) 05/22/2024   Lab Results  Component Value Date   IGGSERUM 741 05/22/2024   IGA 107 05/22/2024   IGMSERUM 255 (H) 05/22/2024   No results found for: STEPHANY CARLOTA BENSON MARKEL EARLA JOANNIE DOC VICK, SPEI   Chemistry      Component Value Date/Time   NA 139 08/28/2024 0813   NA 141 11/03/2015 1117   K 4.3 08/28/2024 0813   CL 102 08/28/2024 0813   CO2 25 08/28/2024 0813   BUN 14 08/28/2024 0813   BUN 22 11/03/2015 1117   CREATININE 0.78 08/28/2024 0813      Component Value Date/Time   CALCIUM 9.9 08/28/2024 0813   ALKPHOS 147 (H) 08/28/2024 0813   AST 18 08/28/2024 0813   ALT 12 08/28/2024 0813   BILITOT 0.6 08/28/2024 0813       Impression and Plan: [name] is a very pleasant 78 yo caucasian female with history of pulmonary emboli and lower extremity DVT with transiently positive lupus anticoagulant (resolved).  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   I really feel bad for all the pain that she is having.  Again, I just would be shocked that this would be from her underlying CLL.  Again we will see about a PET scan.  I think what is interesting is this nodule under the skin in the left lower leg.  I think we can probably have to get this biopsy.  I really need to see what this is first.  I would go ahead and get a CT scan to see if this may be able to help us .  Again, we will give her treatment today.  She will get her Gazyva  today.  I think she is doing well on the Brukinsa .  I am not yet ready to add the venetoclax with all that is going on with her.  We will have to follow this up closely.  We clearly  will have to get her back to see us  in another few weeks.   Maude JONELLE Crease, MD 10/7/20258:52 AM

## 2024-09-24 NOTE — Patient Instructions (Signed)
 [facility] [place] [facility]  Discharge Instructions: Thank you for choosing [facility] to provide your oncology and hematology care.   If you have a lab appointment with the Cancer Center, please go directly to the Cancer Center and check in at the registration area.  Wear comfortable clothing and clothing appropriate for easy access to any Portacath or PICC line.   We strive to give you quality time with your provider. You may need to reschedule your appointment if you arrive late (15 or more minutes).  Arriving late affects you and other patients whose appointments are after yours.  Also, if you miss three or more appointments without notifying the office, you may be dismissed from the clinic at the provider's discretion.      For prescription refill requests, have your pharmacy contact our office and allow 72 hours for refills to be completed.    Today you received the following chemotherapy and/or immunotherapy agents Gazyva      To help prevent nausea and vomiting after your treatment, we encourage you to take your nausea medication as directed.  BELOW ARE SYMPTOMS THAT SHOULD BE REPORTED IMMEDIATELY: *FEVER GREATER THAN 100.4 F (38 C) OR HIGHER *CHILLS OR SWEATING *NAUSEA AND VOMITING THAT IS NOT CONTROLLED WITH YOUR NAUSEA MEDICATION *UNUSUAL SHORTNESS OF BREATH *UNUSUAL BRUISING OR BLEEDING *URINARY PROBLEMS (pain or burning when urinating, or frequent urination) *BOWEL PROBLEMS (unusual diarrhea, constipation, pain near the anus) TENDERNESS IN MOUTH AND THROAT WITH OR WITHOUT PRESENCE OF ULCERS (sore throat, sores in mouth, or a toothache) UNUSUAL RASH, SWELLING OR PAIN  UNUSUAL VAGINAL DISCHARGE OR ITCHING   Items with * indicate a potential emergency and should be followed up as soon as possible or go to the Emergency Department if any problems should occur.  Please show the CHEMOTHERAPY ALERT CARD or IMMUNOTHERAPY ALERT  CARD at check-in to the Emergency Department and triage nurse. Should you have questions after your visit or need to cancel or reschedule your appointment, please contact [facility] [place] - A DEPT OF [facility]  [phone] and follow the prompts.  Office hours are 8:00 a.m. to 4:30 p.m. Monday - Friday. Please note that voicemails left after 4:00 p.m. may not be returned until the following business day.  We are closed weekends and major holidays. You have access to a nurse at all times for urgent questions. Please call the main number to the clinic [phone] and follow the prompts.  For any non-urgent questions, you may also contact your provider using MyChart. We now offer e-Visits for anyone 75 and older to request care online for non-urgent symptoms. For details visit [email or web address].   Also download the MyChart app! Go to the app store, search "MyChart", open the app, select , and log in with your MyChart username and password.

## 2024-09-26 ENCOUNTER — Other Ambulatory Visit: Payer: Self-pay | Admitting: Hematology & Oncology

## 2024-09-30 ENCOUNTER — Other Ambulatory Visit: Payer: Self-pay

## 2024-10-01 ENCOUNTER — Other Ambulatory Visit: Payer: Self-pay

## 2024-10-01 ENCOUNTER — Encounter: Payer: Self-pay | Admitting: Hematology & Oncology

## 2024-10-01 ENCOUNTER — Observation Stay (HOSPITAL_COMMUNITY)

## 2024-10-01 ENCOUNTER — Encounter (HOSPITAL_COMMUNITY): Payer: Self-pay

## 2024-10-01 ENCOUNTER — Inpatient Hospital Stay (HOSPITAL_COMMUNITY)
Admission: EM | Admit: 2024-10-01 | Discharge: 2024-10-05 | DRG: 543 | Disposition: A | Discharge status: Home / Self Care | Source: Ambulatory Visit | Attending: Hospitalist | Admitting: Hospitalist

## 2024-10-01 DIAGNOSIS — G8929 Other chronic pain: Secondary | ICD-10-CM | POA: Diagnosis present

## 2024-10-01 DIAGNOSIS — Z86711 Personal history of pulmonary embolism: Secondary | ICD-10-CM

## 2024-10-01 DIAGNOSIS — Z96651 Presence of right artificial knee joint: Secondary | ICD-10-CM | POA: Diagnosis present

## 2024-10-01 DIAGNOSIS — I252 Old myocardial infarction: Secondary | ICD-10-CM

## 2024-10-01 DIAGNOSIS — Z7901 Long term (current) use of anticoagulants: Secondary | ICD-10-CM

## 2024-10-01 DIAGNOSIS — Z7951 Long term (current) use of inhaled steroids: Secondary | ICD-10-CM

## 2024-10-01 DIAGNOSIS — K219 Gastro-esophageal reflux disease without esophagitis: Secondary | ICD-10-CM | POA: Diagnosis present

## 2024-10-01 DIAGNOSIS — D500 Iron deficiency anemia secondary to blood loss (chronic): Secondary | ICD-10-CM

## 2024-10-01 DIAGNOSIS — G2581 Restless legs syndrome: Secondary | ICD-10-CM | POA: Diagnosis present

## 2024-10-01 DIAGNOSIS — J4530 Mild persistent asthma, uncomplicated: Secondary | ICD-10-CM | POA: Diagnosis present

## 2024-10-01 DIAGNOSIS — F03918 Unspecified dementia, unspecified severity, with other behavioral disturbance: Secondary | ICD-10-CM | POA: Diagnosis present

## 2024-10-01 DIAGNOSIS — Z79890 Hormone replacement therapy (postmenopausal): Secondary | ICD-10-CM

## 2024-10-01 DIAGNOSIS — M549 Dorsalgia, unspecified: Secondary | ICD-10-CM | POA: Diagnosis not present

## 2024-10-01 DIAGNOSIS — C9110 Chronic lymphocytic leukemia of B-cell type not having achieved remission: Secondary | ICD-10-CM | POA: Diagnosis present

## 2024-10-01 DIAGNOSIS — M48061 Spinal stenosis, lumbar region without neurogenic claudication: Secondary | ICD-10-CM | POA: Diagnosis present

## 2024-10-01 DIAGNOSIS — G4700 Insomnia, unspecified: Secondary | ICD-10-CM | POA: Diagnosis present

## 2024-10-01 DIAGNOSIS — E119 Type 2 diabetes mellitus without complications: Secondary | ICD-10-CM | POA: Diagnosis present

## 2024-10-01 DIAGNOSIS — Z86718 Personal history of other venous thrombosis and embolism: Secondary | ICD-10-CM

## 2024-10-01 DIAGNOSIS — M545 Low back pain, unspecified: Secondary | ICD-10-CM | POA: Diagnosis not present

## 2024-10-01 DIAGNOSIS — I10 Essential (primary) hypertension: Secondary | ICD-10-CM | POA: Diagnosis present

## 2024-10-01 DIAGNOSIS — D6862 Lupus anticoagulant syndrome: Secondary | ICD-10-CM | POA: Diagnosis present

## 2024-10-01 DIAGNOSIS — M4807 Spinal stenosis, lumbosacral region: Secondary | ICD-10-CM | POA: Diagnosis present

## 2024-10-01 DIAGNOSIS — M8448XA Pathological fracture, other site, initial encounter for fracture: Principal | ICD-10-CM | POA: Diagnosis present

## 2024-10-01 DIAGNOSIS — F0393 Unspecified dementia, unspecified severity, with mood disturbance: Secondary | ICD-10-CM | POA: Diagnosis present

## 2024-10-01 DIAGNOSIS — R0609 Other forms of dyspnea: Secondary | ICD-10-CM | POA: Diagnosis present

## 2024-10-01 DIAGNOSIS — H409 Unspecified glaucoma: Secondary | ICD-10-CM | POA: Diagnosis present

## 2024-10-01 DIAGNOSIS — E039 Hypothyroidism, unspecified: Secondary | ICD-10-CM | POA: Diagnosis present

## 2024-10-01 DIAGNOSIS — F32A Depression, unspecified: Secondary | ICD-10-CM | POA: Diagnosis present

## 2024-10-01 DIAGNOSIS — F0394 Unspecified dementia, unspecified severity, with anxiety: Secondary | ICD-10-CM | POA: Diagnosis present

## 2024-10-01 DIAGNOSIS — Z8249 Family history of ischemic heart disease and other diseases of the circulatory system: Secondary | ICD-10-CM

## 2024-10-01 LAB — CBC WITH DIFFERENTIAL/PLATELET
Abs Immature Granulocytes: 0.11 K/uL — ABNORMAL HIGH (ref 0.00–0.07)
Basophils Absolute: 0.1 K/uL (ref 0.0–0.1)
Basophils Relative: 2 %
Eosinophils Absolute: 0.1 K/uL (ref 0.0–0.5)
Eosinophils Relative: 2 %
HCT: 41.9 % (ref 36.0–46.0)
Hemoglobin: 13.3 g/dL (ref 12.0–15.0)
Immature Granulocytes: 2 %
Lymphocytes Relative: 30 %
Lymphs Abs: 1.7 K/uL (ref 0.7–4.0)
MCH: 30.1 pg (ref 26.0–34.0)
MCHC: 31.7 g/dL (ref 30.0–36.0)
MCV: 94.8 fL (ref 80.0–100.0)
Monocytes Absolute: 0.5 K/uL (ref 0.1–1.0)
Monocytes Relative: 8 %
Neutro Abs: 3.2 K/uL (ref 1.7–7.7)
Neutrophils Relative %: 56 %
Platelets: 215 K/uL (ref 150–400)
RBC: 4.42 MIL/uL (ref 3.87–5.11)
RDW: 14.1 % (ref 11.5–15.5)
WBC: 5.7 K/uL (ref 4.0–10.5)
nRBC: 0 % (ref 0.0–0.2)

## 2024-10-01 LAB — URINALYSIS, ROUTINE W REFLEX MICROSCOPIC
Glucose, UA: NEGATIVE mg/dL
Hgb urine dipstick: NEGATIVE
Ketones, ur: 5 mg/dL — AB
Leukocytes,Ua: NEGATIVE
Nitrite: NEGATIVE
Protein, ur: 100 mg/dL — AB
Specific Gravity, Urine: 1.036 — ABNORMAL HIGH (ref 1.005–1.030)
pH: 5 (ref 5.0–8.0)

## 2024-10-01 LAB — COMPREHENSIVE METABOLIC PANEL WITH GFR
ALT: 9 U/L (ref 0–44)
AST: 20 U/L (ref 15–41)
Albumin: 4.1 g/dL (ref 3.5–5.0)
Alkaline Phosphatase: 175 U/L — ABNORMAL HIGH (ref 38–126)
Anion gap: 11 (ref 5–15)
BUN: 11 mg/dL (ref 8–23)
CO2: 24 mmol/L (ref 22–32)
Calcium: 10.2 mg/dL (ref 8.9–10.3)
Chloride: 103 mmol/L (ref 98–111)
Creatinine, Ser: 0.83 mg/dL (ref 0.44–1.00)
GFR, Estimated: >60 mL/min (ref >60)
Glucose, Bld: 141 mg/dL — ABNORMAL HIGH (ref 70–99)
Potassium: 4.2 mmol/L (ref 3.5–5.1)
Sodium: 139 mmol/L (ref 135–145)
Total Bilirubin: 0.4 mg/dL (ref 0.0–1.2)
Total Protein: 6.5 g/dL (ref 6.5–8.1)

## 2024-10-01 MED ORDER — ACETAMINOPHEN 650 MG RE SUPP: 650.0000 mg | Freq: Four times a day (QID) | RECTAL | Status: DC | PRN

## 2024-10-01 MED ORDER — ROPINIROLE HCL 1 MG PO TABS
2.0000 mg | ORAL_TABLET | Freq: Every day | ORAL | Status: DC
  Administered 2024-10-01 – 2024-10-04 (×4): 2 mg via ORAL
  Filled 2024-10-01 (×5): qty 2

## 2024-10-01 MED ORDER — RIVAROXABAN 10 MG PO TABS
10.0000 mg | ORAL_TABLET | Freq: Every day | ORAL | Status: DC
Start: 2024-10-01 — End: 2024-10-02
  Administered 2024-10-01: 10 mg via ORAL
  Filled 2024-10-01: qty 1

## 2024-10-01 MED ORDER — FLUTICASONE PROPIONATE 50 MCG/ACT NA SUSP
2.0000 | Freq: Every day | NASAL | Status: DC
  Administered 2024-10-03 – 2024-10-05 (×3): 2 via NASAL
  Filled 2024-10-01: qty 16

## 2024-10-01 MED ORDER — OXYCODONE HCL 5 MG PO TABS
5.0000 mg | ORAL_TABLET | Freq: Once | ORAL | Status: AC
  Administered 2024-10-01: 5 mg via ORAL
  Filled 2024-10-01: qty 1

## 2024-10-01 MED ORDER — DULOXETINE HCL 30 MG PO CPEP
30.0000 mg | ORAL_CAPSULE | Freq: Two times a day (BID) | ORAL | Status: DC
  Administered 2024-10-01 – 2024-10-05 (×8): 30 mg via ORAL
  Filled 2024-10-01 (×8): qty 1

## 2024-10-01 MED ORDER — FENTANYL CITRATE (PF) 50 MCG/ML IJ SOSY
50.0000 ug | PREFILLED_SYRINGE | Freq: Once | INTRAMUSCULAR | Status: AC | PRN
  Administered 2024-10-01: 50 ug via INTRAVENOUS
  Filled 2024-10-01: qty 1

## 2024-10-01 MED ORDER — LEVOTHYROXINE SODIUM 50 MCG PO TABS
50.0000 ug | ORAL_TABLET | Freq: Every day | ORAL | Status: DC
  Administered 2024-10-02 – 2024-10-05 (×4): 50 ug via ORAL
  Filled 2024-10-01 (×4): qty 1

## 2024-10-01 MED ORDER — TRAZODONE HCL 50 MG PO TABS
50.0000 mg | ORAL_TABLET | Freq: Every day | ORAL | Status: DC
  Administered 2024-10-01: 50 mg via ORAL
  Filled 2024-10-01: qty 1

## 2024-10-01 MED ORDER — NALOXONE HCL 0.4 MG/ML IJ SOLN: 0.4000 mg | INTRAMUSCULAR | Status: DC | PRN

## 2024-10-01 MED ORDER — GUAIFENESIN-DM 100-10 MG/5ML PO SYRP: 5.0000 mL | ORAL_SOLUTION | ORAL | Status: DC | PRN

## 2024-10-01 MED ORDER — LORATADINE 10 MG PO TABS
10.0000 mg | ORAL_TABLET | Freq: Every day | ORAL | Status: DC
  Administered 2024-10-02 – 2024-10-05 (×4): 10 mg via ORAL
  Filled 2024-10-01 (×4): qty 1

## 2024-10-01 MED ORDER — FAMOTIDINE 20 MG PO TABS
20.0000 mg | ORAL_TABLET | Freq: Every day | ORAL | Status: DC
  Administered 2024-10-01 – 2024-10-04 (×4): 20 mg via ORAL
  Filled 2024-10-01 (×4): qty 1

## 2024-10-01 MED ORDER — ALBUTEROL SULFATE (2.5 MG/3ML) 0.083% IN NEBU: 2.5000 mg | INHALATION_SOLUTION | Freq: Four times a day (QID) | RESPIRATORY_TRACT | Status: DC | PRN

## 2024-10-01 MED ORDER — MORPHINE SULFATE (PF) 2 MG/ML IV SOLN: 1.0000 mg | INTRAVENOUS | Status: DC | PRN

## 2024-10-01 MED ORDER — LATANOPROST 0.005 % OP SOLN
1.0000 [drp] | Freq: Every day | OPHTHALMIC | Status: DC
  Administered 2024-10-01 – 2024-10-04 (×4): 1 [drp] via OPHTHALMIC
  Filled 2024-10-01: qty 2.5

## 2024-10-01 MED ORDER — AMLODIPINE BESYLATE 10 MG PO TABS
10.0000 mg | ORAL_TABLET | Freq: Every day | ORAL | Status: DC
  Administered 2024-10-02 – 2024-10-05 (×4): 10 mg via ORAL
  Filled 2024-10-01 (×4): qty 1

## 2024-10-01 MED ORDER — VENLAFAXINE HCL ER 75 MG PO CP24
150.0000 mg | ORAL_CAPSULE | Freq: Every morning | ORAL | Status: DC
  Administered 2024-10-02 – 2024-10-05 (×4): 150 mg via ORAL
  Filled 2024-10-01: qty 2
  Filled 2024-10-01 (×3): qty 4

## 2024-10-01 MED ORDER — MEMANTINE HCL 10 MG PO TABS
10.0000 mg | ORAL_TABLET | Freq: Two times a day (BID) | ORAL | Status: DC
  Administered 2024-10-01 – 2024-10-05 (×8): 10 mg via ORAL
  Filled 2024-10-01 (×7): qty 1

## 2024-10-01 MED ORDER — ACETAMINOPHEN 325 MG PO TABS
650.0000 mg | ORAL_TABLET | Freq: Four times a day (QID) | ORAL | Status: DC | PRN
  Filled 2024-10-01: qty 2

## 2024-10-01 MED ORDER — LIDOCAINE 5 % EX PTCH
1.0000 | MEDICATED_PATCH | CUTANEOUS | Status: DC
  Administered 2024-10-01 – 2024-10-04 (×4): 1 via TRANSDERMAL
  Filled 2024-10-01 (×4): qty 1

## 2024-10-01 MED ORDER — FLUTICASONE FUROATE-VILANTEROL 200-25 MCG/ACT IN AEPB
1.0000 | INHALATION_SPRAY | Freq: Every day | RESPIRATORY_TRACT | Status: DC
  Administered 2024-10-02 – 2024-10-05 (×3): 1 via RESPIRATORY_TRACT
  Filled 2024-10-01: qty 28

## 2024-10-01 MED ORDER — LABETALOL HCL 5 MG/ML IV SOLN
5.0000 mg | INTRAVENOUS | Status: DC | PRN
  Administered 2024-10-02 – 2024-10-03 (×2): 5 mg via INTRAVENOUS
  Filled 2024-10-01 (×2): qty 4

## 2024-10-01 MED ORDER — OXYCODONE HCL 5 MG PO TABS
5.0000 mg | ORAL_TABLET | Freq: Four times a day (QID) | ORAL | Status: DC | PRN
  Administered 2024-10-02 – 2024-10-03 (×2): 5 mg via ORAL
  Filled 2024-10-01 (×2): qty 1

## 2024-10-01 NOTE — ED Provider Notes (Signed)
 [place] EMERGENCY DEPARTMENT AT [facility] Provider Note   CSN: [id number] Arrival date & time: 10/01/24  1528     Patient presents with: Pain   [name] is a 78 y.o. female here with concern for back pain.  The patient calls for oncology for CLL and has had chronic pain in her back for several weeks.  She had an MRI of the lumbar spine performed last month at [facility], which had noting as listed below.  She reports she has been taking her Norco at home but is not enough to control her pain she has difficulty getting up or even walking.  She did talk to her oncologist today who advised that she come into the hospital for admission for pain control and also for potential further diagnostic workup for alternative malignancy process.  They note that she has a new bony growth on her left leg of unclear etiology.  IMPRESSION:  1. Acute/subacute superior endplate compression of L5 as above.  2. The T11, T12 and L1 vertebrae are sclerotic with stents of T1-weighted hypointensity, hyperintense on T2-weighted pulse sequence. This T1-weighted hypointensity appears involving both the right and left pedicles of T11 and T12. This is concerning for an infiltrative marrow lesion, recommend clinical correlation.  3. Mild bilateral neuroforaminal stenosis L2-3.  4. Mild central canal stenosis and moderate bilateral neuroforaminal stenosis L3-4.  5. Mild central canal stenosis, moderate left lateral recess stenosis and mild bilateral neuroforaminal stenosis L4-5.  6. Mild left lateral recess stenosis, moderate left neuroforaminal stenosis and mild right neuroforaminal stenosis L5-S1.   {Add pertinent medical, surgical, social history, OB history to HPI:32947} HPI     Prior to Admission medications   Medication Sig Start Date End Date Taking? Authorizing Provider  acetaminophen  (TYLENOL ) 650 MG CR tablet Take 650 mg by mouth every 8 (eight) hours as needed for pain.    [provider]  ADVAIR DISKUS 250-50 MCG/DOSE AEPB Inhale one puff into the lungs 2 (two) times daily. 10/01/20   Gladis Elsie BROCKS, PA-C  albuterol  (PROAIR  HFA) 108 (90 BASE) MCG/ACT inhaler Inhale 2 puffs into the lungs every 4 (four) hours as needed for wheezing or shortness of breath. 11/20/14   Neysa Rama D, MD  albuterol  (PROVENTIL ) (2.5 MG/3ML) 0.083% nebulizer solution Take 3 mLs (2.5 mg total) by nebulization every 6 (six) hours as needed for wheezing or shortness of breath. 11/26/14   Neysa Rama BIRCH, MD  amLODipine  (NORVASC ) 10 MG tablet Take 10 mg by mouth daily.     [provider]  cetirizine  (ZYRTEC ) 10 MG tablet Take 10 mg by mouth daily.    [provider]  DULoxetine (CYMBALTA) 30 MG capsule Take by mouth 2 (two) times daily. 08/02/24 09/01/24  [provider]  famciclovir  (FAMVIR ) 250 MG tablet Take 1 tablet (250 mg total) by mouth daily. 05/22/24   Timmy Maude SAUNDERS, MD  famotidine  (PEPCID ) 20 MG tablet Take 20 mg by mouth at bedtime. 07/17/24   [provider]  fluticasone  (FLONASE ) 50 MCG/ACT nasal spray Place 2 sprays into both nostrils daily. 10/22/21   Gladis, Mary-Margaret, FNP  latanoprost  (XALATAN ) 0.005 % ophthalmic solution Place 1 drop into both eyes at bedtime.  08/18/15   [provider]  levothyroxine  (SYNTHROID ) 50 MCG tablet Take 50 mcg by mouth daily. 11/02/20   [provider]  meclizine  (ANTIVERT ) 25 MG tablet Take 25-50 mg by mouth 3 (three) times daily. 09/14/20   [provider]  memantine (NAMENDA) 10 MG tablet Take 10 mg by mouth 2 (two) times daily. 05/01/24   [provider]  ondansetron  (ZOFRAN ) 8 MG tablet Take 1 tablet (8 mg total) by mouth every 8 (eight) hours as needed for nausea or vomiting. 05/27/24   Timmy Maude SAUNDERS, MD  pantoprazole  (PROTONIX ) 40 MG tablet Take 40 mg by mouth daily.  08/29/15   [provider]  prochlorperazine  (COMPAZINE ) 10 MG tablet Take 1 tablet (10 mg  total) by mouth every 6 (six) hours as needed for nausea or vomiting. 05/27/24   Timmy Maude SAUNDERS, MD  rOPINIRole  (REQUIP ) 2 MG tablet Take 2 mg by mouth at bedtime.  11/10/14   [provider]  traMADol  (ULTRAM ) 50 MG tablet Take 1 tablet (50 mg total) by mouth every 6 (six) hours as needed. 08/21/24   Timmy Maude SAUNDERS, MD  traZODone (DESYREL) 50 MG tablet Take 50 mg by mouth at bedtime. 04/04/24 08/28/24  [provider]  venlafaxine  XR (EFFEXOR -XR) 150 MG 24 hr capsule Take 150 mg by mouth every morning. 09/30/20   [provider]  Vitamin D, Ergocalciferol, (DRISDOL) 1.25 MG (50000 UNIT) CAPS capsule Take 1 capsule by mouth once a week. 05/01/24   [provider]  XARELTO  10 MG TABS tablet TAKE ONE TABLET BY MOUTH EVERY DAY WITH SUPPER 09/26/24   Timmy Maude SAUNDERS, MD  zanubrutinib  (BRUKINSA ) 80 MG capsule Take 2 capsules (160 mg total) by mouth 2 (two) times daily. 05/22/24   Timmy Maude SAUNDERS, MD    Allergies: Naprosyn [naproxen], Naproxen, and Tizanidine     Review of Systems  Updated Vital Signs BP (!) 181/69   Pulse 88   Temp 97.8 F (36.6 C) (Oral)   Resp 19   SpO2 100%   Physical Exam Constitutional:      General: She is not in acute distress. HENT:     Head: Normocephalic and atraumatic.  Eyes:     Conjunctiva/sclera: Conjunctivae normal.     Pupils: Pupils are equal, round, and reactive to light.  Cardiovascular:     Rate and Rhythm: Normal rate and regular rhythm.  Pulmonary:     Effort: Pulmonary effort is normal. No respiratory distress.  Abdominal:     General: There is no distension.     Tenderness: There is no abdominal tenderness.  Musculoskeletal:     Comments: Bony growth left leg  Skin:    General: Skin is warm and dry.  Neurological:     General: No focal deficit present.     Mental Status: She is alert and oriented to person, place, and time. Mental status is at baseline.  Psychiatric:        Mood and Affect: Mood normal.         Behavior: Behavior normal.     (all labs ordered are listed, but only abnormal results are displayed) Labs Reviewed  CBC WITH DIFFERENTIAL/PLATELET - Abnormal; Notable for the following components:      Result Value   Abs Immature Granulocytes 0.11 (*)    All other components within normal limits  COMPREHENSIVE METABOLIC PANEL WITH GFR - Abnormal; Notable for the following components:   Glucose, Bld 141 (*)    Alkaline Phosphatase 175 (*)    All other components within normal limits  URINALYSIS, ROUTINE W REFLEX MICROSCOPIC    EKG: None  Radiology: No results found.  {Document cardiac monitor, telemetry assessment procedure when appropriate:32947} Procedures   Medications Ordered in the ED  fentaNYL  (SUBLIMAZE ) injection 50 mcg (has no administration in time range)  oxyCODONE  (Oxy IR/ROXICODONE ) immediate release tablet 5 mg (5 mg Oral Given 10/01/24 1737)      {Click here for ABCD2, HEART and other calculators REFRESH Note before signing:1}                              Medical Decision Making Amount and/or Complexity of Data Reviewed Labs: ordered.  Risk Prescription drug management.   Patient is here with acute on chronic low back pain, no traumatic falls or recent accidents.  No red flags for cauda equina syndrome.  She does not have weakness in her legs but appears to be pain limited with standing (she also reports he has restless leg syndrome which makes her have a hard time walking).  She is not able to pain control at home with Norco.  I reviewed her external records including her oncology notes.  I was not able to speak directly to Dr Timmy, but from the history provided by the patient's family, as well as the outpatient records, we will plan to admit the patient to the hospital for pain control overnight, and a consult by her oncologist in the morning for further potential inpatient diagnostic workup.  There were no emergent findings on the patient's blood  work at this time.  The patient and her granddaughter at the bedside, who provided supplemental history, were both comfortable with this plan.  {Document critical care time when appropriate  Document review of labs and clinical decision tools ie CHADS2VASC2, etc  Document your independent review of radiology images and any outside records  Document your discussion with family members, caretakers and with consultants  Document social determinants of health affecting pt's care  Document your decision making why or why not admission, treatments were needed:32947:::1}   Final diagnoses:  Acute low back pain, unspecified back pain laterality, unspecified whether sciatica present    ED Discharge Orders     None

## 2024-10-01 NOTE — H&P (Signed)
 History and Physical    GALAXY BORDEN FMW:[id number] DOB: June 04, 1946 DOA: 10/01/2024  PCP: Medicine, [facility] (Inactive)  Patient coming from: Home  Chief Complaint: Back pain  HPI: FRAYA UEDA is a 78 y.o. 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  Patient is seen by oncologist Dr. Timmy for her CLL and is currently on Gazyva /Brukinsa .  She had a recent oncology follow-up office visit in 10/7 for worsening back pain and had recently undergone an MRI at [facility] 2 weeks ago on 9/29 which showed (findings listed below).  Oncology had also noted a nodule under the skin of her left lower leg and concerned about possible transformation to high-grade lymphoma and had ordered CT scan of left lower extremity with contrast and PET scan.  CT has not been done yet.    MRI of lumbar spine done at [facility] on 09/16/2024 showing: IMPRESSION:  1. Acute/subacute superior endplate compression of L5 as above.  2. The T11, T12 and L1 vertebrae are sclerotic with stents of T1-weighted hypointensity, hyperintense on T2-weighted pulse sequence. This T1-weighted hypointensity appears involving both the right and left pedicles of T11 and T12. This is concerning for an infiltrative marrow lesion, recommend clinical correlation.  3. Mild bilateral neuroforaminal stenosis L2-3.  4. Mild central canal stenosis and moderate bilateral neuroforaminal stenosis L3-4.  5. Mild central canal stenosis, moderate left lateral recess stenosis and mild bilateral neuroforaminal stenosis L4-5.  6. Mild left lateral recess stenosis, moderate left neuroforaminal stenosis and mild right neuroforaminal stenosis L5-S1.   PET scan done at [facility] on 09/27/2024 showing: FINDINGS:  #  Bones:  Uptake within multiple thoracic and lumbar vertebrae consistent with metastasis. Single focus of uptake involving a lower left anterior rib. Asymmetrically increased uptake within the right shoulder.  #  Soft tissues: Normal distribution of radiotracer throughout the soft tissues.  #  Additional comments:    IMPRESSION:   Focal uptake within the thoracic lumbar spine.  Asymmetric increased uptake within the right shoulder, may be degenerative.   Patient presents to the ED today with a chief complaint of back pain.  History provided by the patient and her granddaughter at bedside.  Patient is reporting low back pain.  Granddaughter states her back pain has been going on for the past 3 months but has been worse for the past 3 weeks.  She is taking hydrocodone  twice a day at home which is not helping much.  No fevers, lower extremity weakness, saddle anesthesia, or bowel/bladder dysfunction reported.  No falls or trauma reported.  Patient is complaining of dyspnea on exertion and cough.  She is compliant with her home rivaroxaban .  No chest pain.  Patient talked to her oncologist today who advised her to come into the hospital for admission for pain control for her back pain and also for potential further diagnostic workup for alternative malignancy process.  ED Course: Hypertensive with systolic up to 190 but remainder of vital signs stable.  No significant or acute findings on CBC and CMP.  Alkaline phosphatase elevated on previous labs as well.  UA not suggestive of infection.  EDP was not able to get in touch with patient's primary oncologist but did speak to on-call oncologist Dr. Loretha.  Oncology will consult in the morning and give recommendations.  Patient was given oxycodone  and IV fentanyl .  Review of Systems:  Review of Systems  All other systems reviewed and are negative.   Past Medical History:  Diagnosis Date   Allergic rhinitis    Allergic rhinitis    Anxiety    Arthritis     hands, feet, shoulders, arms (02/22/2018)   Bilateral pulmonary embolism (HCC) 02/21/2018   Chronic cough    CLL (chronic lymphocytic leukemia) (HCC) 04/24/2024   Depression    Disease of thyroid  gland    Dupuytren contracture    both feet, both hands (02/22/2018)   DVT (deep venous thrombosis) (HCC)    bil le   GERD (gastroesophageal reflux disease)    Glaucoma    Hypertension    Hypothyroidism    Iron deficiency anemia due to chronic blood loss 05/23/2024   Mild persistent asthma    cougher not a wheezr gets chokes easily   Pneumonia    PONV (postoperative nausea and vomiting)     Past Surgical History:  Procedure Laterality Date   BLADDER SUSPENSION  X 2   BUNIONECTOMY     RIGHT FOOT ARCH REPAIRED AS WELL   CATARACT EXTRACTION W/ INTRAOCULAR LENS  IMPLANT, BILATERAL Bilateral    DILATION AND CURETTAGE OF UTERUS  X 3   DUPUYTREN CONTRACTURE RELEASE Bilateral    feet   FASCIECTOMY Right 01/23/2018   Procedure: SEGMENTAL FASCIECTOMY RIGHT MIDDLE FINGER;  Surgeon: Murrell Kuba, MD;  Location:  SURGERY CENTER;  Service: Orthopedics;  Laterality: Right;  AXILLARY BLOCK   fracture surgery right tibia and ankle     KNEE ARTHROSCOPY Right    REDUCTION MAMMAPLASTY Bilateral 2018   TOTAL ABDOMINAL HYSTERECTOMY  1975   got pregnant w/IUD in then lost baby   TOTAL KNEE ARTHROPLASTY Right 06/08/2020   Procedure: TOTAL KNEE ARTHROPLASTY;  Surgeon: Rubie Kemps, MD;  Location: [facility];  Service: Orthopedics;  Laterality: Right;     reports that she has never smoked. She has never used smokeless tobacco. She reports that she does not currently use alcohol. She reports that she does not use drugs.  Allergies  Allergen Reactions   Naprosyn [Naproxen] Other (See Comments)    Angioedema   Naproxen Shortness Of Breath, Itching and Swelling   Tizanidine  Shortness Of Breath and Other (See Comments)    dizziness    Family History  Problem Relation Age of Onset    Hypertension Mother    Arthritis Mother    Breast cancer Mother    Colon cancer Mother    Colon polyps Mother    Diabetes Father    Pneumonia Father    Heart attack Father    Heart disease Father    Dementia Father    Diabetes Brother    Breast cancer Maternal Aunt    Sleep apnea Neg Hx     Prior to Admission medications   Medication Sig Start Date End Date Taking? Authorizing Provider  acetaminophen  (TYLENOL ) 650 MG CR tablet Take 650 mg by mouth every 8 (eight) hours as needed for pain.    [provider]  ADVAIR DISKUS 250-50 MCG/DOSE AEPB Inhale one puff into the lungs 2 (two) times daily. 10/01/20   Gladis Elsie BROCKS, PA-C  albuterol  (PROAIR  HFA) 108 (90 BASE) MCG/ACT inhaler Inhale 2 puffs into the lungs every 4 (four) hours as needed for wheezing or shortness of breath. 11/20/14   Neysa Rama D, MD  albuterol  (PROVENTIL ) (2.5 MG/3ML) 0.083% nebulizer solution Take 3 mLs (2.5 mg total) by nebulization every 6 (six)  hours as needed for wheezing or shortness of breath. 11/26/14   Neysa Rama D, MD  amLODipine  (NORVASC ) 10 MG tablet Take 10 mg by mouth daily.     [provider]  cetirizine  (ZYRTEC ) 10 MG tablet Take 10 mg by mouth daily.    [provider]  DULoxetine (CYMBALTA) 30 MG capsule Take by mouth 2 (two) times daily. 08/02/24 09/01/24  [provider]  famciclovir  (FAMVIR ) 250 MG tablet Take 1 tablet (250 mg total) by mouth daily. 05/22/24   Timmy Maude SAUNDERS, MD  famotidine  (PEPCID ) 20 MG tablet Take 20 mg by mouth at bedtime. 07/17/24   [provider]  fluticasone  (FLONASE ) 50 MCG/ACT nasal spray Place 2 sprays into both nostrils daily. 10/22/21   Gladis, Mary-Margaret, FNP  latanoprost  (XALATAN ) 0.005 % ophthalmic solution Place 1 drop into both eyes at bedtime.  08/18/15   [provider]  levothyroxine  (SYNTHROID ) 50 MCG tablet Take 50 mcg by mouth daily. 11/02/20   [provider]  meclizine  (ANTIVERT ) 25 MG  tablet Take 25-50 mg by mouth 3 (three) times daily. 09/14/20   [provider]  memantine (NAMENDA) 10 MG tablet Take 10 mg by mouth 2 (two) times daily. 05/01/24   [provider]  ondansetron  (ZOFRAN ) 8 MG tablet Take 1 tablet (8 mg total) by mouth every 8 (eight) hours as needed for nausea or vomiting. 05/27/24   Timmy Maude SAUNDERS, MD  pantoprazole  (PROTONIX ) 40 MG tablet Take 40 mg by mouth daily.  08/29/15   [provider]  prochlorperazine  (COMPAZINE ) 10 MG tablet Take 1 tablet (10 mg total) by mouth every 6 (six) hours as needed for nausea or vomiting. 05/27/24   Timmy Maude SAUNDERS, MD  rOPINIRole  (REQUIP ) 2 MG tablet Take 2 mg by mouth at bedtime.  11/10/14   [provider]  traMADol  (ULTRAM ) 50 MG tablet Take 1 tablet (50 mg total) by mouth every 6 (six) hours as needed. 08/21/24   Timmy Maude SAUNDERS, MD  traZODone (DESYREL) 50 MG tablet Take 50 mg by mouth at bedtime. 04/04/24 08/28/24  [provider]  venlafaxine  XR (EFFEXOR -XR) 150 MG 24 hr capsule Take 150 mg by mouth every morning. 09/30/20   [provider]  Vitamin D, Ergocalciferol, (DRISDOL) 1.25 MG (50000 UNIT) CAPS capsule Take 1 capsule by mouth once a week. 05/01/24   [provider]  XARELTO  10 MG TABS tablet TAKE ONE TABLET BY MOUTH EVERY DAY WITH SUPPER 09/26/24   Timmy Maude SAUNDERS, MD  zanubrutinib  (BRUKINSA ) 80 MG capsule Take 2 capsules (160 mg total) by mouth 2 (two) times daily. 05/22/24   Timmy Maude SAUNDERS, MD    Physical Exam: Vitals:   10/01/24 1845 10/01/24 1850 10/01/24 1855 10/01/24 1900  BP: (!) 190/73   (!) 181/81  Pulse: 89 88 88 92  Resp:    16  Temp:      TempSrc:      SpO2: 95% 99% 97% 93%    Physical Exam Vitals reviewed.  Constitutional:      General: She is not in acute distress. HENT:     Head: Normocephalic and atraumatic.  Eyes:     Extraocular Movements: Extraocular movements intact.  Cardiovascular:     Rate and Rhythm: Normal rate and  regular rhythm.     Heart sounds: Normal heart sounds.  Pulmonary:     Effort: Pulmonary effort is normal. No respiratory distress.     Breath sounds: Normal breath sounds.  Abdominal:     General: Bowel sounds are normal.     Palpations: Abdomen is soft.     Tenderness: There is no abdominal tenderness. There is no guarding.  Musculoskeletal:     Cervical back: Normal range of motion.     Right lower leg: No edema.     Left lower leg: No edema.     Comments: Subcutaneous nodule noted on the upper part of her left lower leg, medial to the tibia which is mobile and nontender.  Skin:    General: Skin is warm and dry.  Neurological:     General: No focal deficit present.     Mental Status: She is alert and oriented to person, place, and time.     Sensory: No sensory deficit.     Motor: No weakness.     Comments: Strength 5/5 in bilateral lower extremities and no sensory deficit     Labs on Admission: I have personally reviewed following labs and imaging studies  CBC: Recent Labs  Lab 10/01/24 1552  WBC 5.7  NEUTROABS 3.2  HGB 13.3  HCT 41.9  MCV 94.8  PLT 215   Basic Metabolic Panel: Recent Labs  Lab 10/01/24 1552  NA 139  K 4.2  CL 103  CO2 24  GLUCOSE 141*  BUN 11  CREATININE 0.83  CALCIUM 10.2   GFR: Estimated Creatinine Clearance: 48.9 mL/min (by C-G formula based on SCr of 0.83 mg/dL). Liver Function Tests: Recent Labs  Lab 10/01/24 1552  AST 20  ALT 9  ALKPHOS 175*  BILITOT 0.4  PROT 6.5  ALBUMIN 4.1   No results for input(s): LIPASE, AMYLASE in the last 168 hours. No results for input(s): AMMONIA in the last 168 hours. Coagulation Profile: No results for input(s): INR, PROTIME in the last 168 hours. Cardiac Enzymes: No results for input(s): CKTOTAL, CKMB, CKMBINDEX, TROPONINI in the last 168 hours. BNP (last 3 results) No results for input(s): PROBNP in the last 8760 hours. HbA1C: No results for input(s): HGBA1C in the  last 72 hours. CBG: No results for input(s): GLUCAP in the last 168 hours. Lipid Profile: No results for input(s): CHOL, HDL, LDLCALC, TRIG, CHOLHDL, LDLDIRECT in the last 72 hours. Thyroid  Function Tests: No results for input(s): TSH, T4TOTAL, FREET4, T3FREE, THYROIDAB in the last 72 hours. Anemia Panel: No results for input(s): VITAMINB12, FOLATE, FERRITIN, TIBC, IRON, RETICCTPCT in the last 72 hours. Urine analysis:    Component Value Date/Time   COLORURINE AMBER (A) 10/01/2024 1847   APPEARANCEUR HAZY (A) 10/01/2024 1847   LABSPEC 1.036 (H) 10/01/2024 1847   PHURINE 5.0 10/01/2024 1847   GLUCOSEU NEGATIVE 10/01/2024 1847   HGBUR NEGATIVE 10/01/2024 1847   BILIRUBINUR SMALL (A) 10/01/2024 1847   KETONESUR 5 (A) 10/01/2024 1847   PROTEINUR 100 (A) 10/01/2024 1847   NITRITE NEGATIVE 10/01/2024 1847   LEUKOCYTESUR NEGATIVE 10/01/2024 1847    Radiological Exams on Admission: No results found.  Assessment and Plan  Acute on chronic low back pain CLL currently on treatment No falls or trauma reported.  No red flag symptoms to suggest cauda equina syndrome.  Recent MRI of lumbar spine done 2 weeks ago showing acute/subacute superior endplate compression of L5, concern for an infiltrative marrow lesion, and central canal stenosis/bilateral neuroforaminal stenosis at multiple levels (see HPI).  PET scan done 4 days ago showing uptake within multiple thoracic and lumbar vertebrae consistent with metastases, a single focus of uptake involving a lower left anterior rib, and  asymmetrically increased uptake within the right shoulder/possibly degenerative.  She has a left lower leg subcutaneous nodule on exam and oncology concerned about possible transformation to high-grade lymphoma.  Plan was to get an outpatient CT of her left lower extremity with contrast which has not been done yet so I have ordered inpatient CT scan.  Oncology will consult in the morning  and give recommendations.  Continue pain management: IV morphine as needed, Oxy IR as needed, Tylenol  as needed, and lidocaine  patch.  Dyspnea on exertion, cough Lungs clear on exam.  Chest x-ray ordered.  PE less likely as patient is compliant with her home rivaroxaban  and not hypoxic.  Not endorsing chest pain.  Hypertension Blood pressure elevated with SBP 170-180s.  Continue pain management.  Continue home amlodipine .  IV labetalol PRN SBP >160.  History of DVT/PE in 2019 Continue rivaroxaban .  Hypothyroidism Continue levothyroxine .  RLS Continue ropinirole .  Dementia Continue memantine.  Delirium precautions.  Asthma Stable, no signs of acute exacerbation.  Continue home inhalers.  GERD Continue famotidine .  Glaucoma Continue latanoprost  eyedrops.  Anxiety/depression/insomnia Continue duloxetine, venlafaxine , and trazodone.  Diet controlled type 2 diabetes Hemoglobin A1c was 6.9 in March 2019 and had improved to 5.8 in August 2021.  Repeat A1c ordered.  Home medications initiated per history provided by the patient and her granddaughter at bedside.  Awaiting pharmacy med rec for verification.   DVT prophylaxis: Rivaroxaban  Code Status: Full Code (discussed with the patient and her granddaughter) Family Communication: Granddaughter updated. Level of care: Med-Surg Admission status: It is my clinical opinion that referral for OBSERVATION is reasonable and necessary in this patient based on the above information provided. The aforementioned taken together are felt to place the patient at high risk for further clinical deterioration. However, it is anticipated that the patient may be medically stable for discharge from the hospital within 24 to 48 hours.  Editha Ram MD [facility]  If 7PM-7AM, please contact night-coverage [email or web address]  10/01/2024, 7:38 PM

## 2024-10-01 NOTE — ED Triage Notes (Signed)
 Pt is here for pain management. Pt has back lower and upper back pain.

## 2024-10-01 NOTE — ED Notes (Signed)
 Notified Dr and RN of pt's BP readings.

## 2024-10-02 ENCOUNTER — Observation Stay (HOSPITAL_COMMUNITY)

## 2024-10-02 DIAGNOSIS — C9110 Chronic lymphocytic leukemia of B-cell type not having achieved remission: Secondary | ICD-10-CM

## 2024-10-02 DIAGNOSIS — M549 Dorsalgia, unspecified: Secondary | ICD-10-CM | POA: Diagnosis not present

## 2024-10-02 DIAGNOSIS — M545 Low back pain, unspecified: Secondary | ICD-10-CM | POA: Diagnosis not present

## 2024-10-02 LAB — HEMOGLOBIN A1C
Hgb A1c MFr Bld: 5.6 % (ref 4.8–5.6)
Mean Plasma Glucose: 114.02 mg/dL

## 2024-10-02 MED ORDER — ENSURE PLUS HIGH PROTEIN PO LIQD
237.0000 mL | Freq: Two times a day (BID) | ORAL | Status: DC
  Administered 2024-10-02 – 2024-10-05 (×4): 237 mL via ORAL

## 2024-10-02 MED ORDER — ENSURE PLUS HIGH PROTEIN PO LIQD: 237.0000 mL | Freq: Two times a day (BID) | ORAL | Status: DC

## 2024-10-02 MED ORDER — QUETIAPINE FUMARATE ER 50 MG PO TB24
50.0000 mg | ORAL_TABLET | Freq: Every day | ORAL | Status: DC
  Administered 2024-10-02 – 2024-10-04 (×3): 50 mg via ORAL
  Filled 2024-10-02 (×3): qty 1

## 2024-10-02 MED ORDER — IOHEXOL 300 MG/ML  SOLN
100.0000 mL | Freq: Once | INTRAMUSCULAR | Status: AC | PRN
  Administered 2024-10-02: 100 mL via INTRAVENOUS

## 2024-10-02 MED ORDER — GADOBUTROL 1 MMOL/ML IV SOLN
6.0000 mL | Freq: Once | INTRAVENOUS | Status: AC | PRN
  Administered 2024-10-02: 6 mL via INTRAVENOUS

## 2024-10-02 MED ORDER — LORAZEPAM 2 MG/ML IJ SOLN
1.0000 mg | Freq: Once | INTRAMUSCULAR | Status: AC
  Administered 2024-10-02: 1 mg via INTRAVENOUS
  Filled 2024-10-02: qty 1

## 2024-10-02 MED ORDER — SODIUM CHLORIDE 0.9 % IV SOLN: INTRAVENOUS | Status: DC

## 2024-10-02 MED ORDER — HEPARIN SODIUM (PORCINE) 5000 UNIT/ML IJ SOLN
5000.0000 [IU] | Freq: Three times a day (TID) | INTRAMUSCULAR | Status: DC
  Administered 2024-10-02 – 2024-10-05 (×10): 5000 [IU] via SUBCUTANEOUS
  Filled 2024-10-02 (×10): qty 1

## 2024-10-02 NOTE — Progress Notes (Signed)
 PHARMACY - ANTICOAGULATION CONSULT NOTE  Pharmacy Consult for Heparin  prophylaxis Indication: PTA Xarelto  on hold for procedures  Allergies  Allergen Reactions   Naproxen Shortness Of Breath, Itching, Swelling and Other (See Comments)    Angioedema   Tizanidine  Shortness Of Breath and Other (See Comments)    Dizziness, also    Patient Measurements:    Vital Signs: Temp: 98.3 F (36.8 C) (10/15 0600) Temp Source: Oral (10/15 0600) BP: 168/69 (10/15 0600) Pulse Rate: 90 (10/15 0600)  Labs: Recent Labs    10/01/24 1552  HGB 13.3  HCT 41.9  PLT 215  CREATININE 0.83    Estimated Creatinine Clearance: 48.9 mL/min (by C-G formula based on SCr of 0.83 mg/dL).   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  PMH is significant for CLL, PE/DVT, transiently positive Lupus anticoagulant, on chronic Xarelto  10mg  daily.  Last Xarelto  dose taken inpatient on 10/14.  Xarelto  is on hold for possible invasive procedures. Discussed with Dr. Timmy the need for full treatment dose heparin  vs prophylactic dosing, and Dr. Timmy wants to use prophylactic dosing.   Goal of Therapy:  Monitor platelets by anticoagulation  protocol: Yes   Plan:  Heparin  5000 units SQ q8h Pharmacy will sign off.    Wanda Hasting PharmD, BCPS [facility] main pharmacy [phone] 10/02/2024 7:23 AM

## 2024-10-02 NOTE — Plan of Care (Signed)
   Problem: Activity: Goal: Risk for activity intolerance will decrease Outcome: Progressing

## 2024-10-02 NOTE — Progress Notes (Signed)
 Patient's bed switched to air mattress per patient request

## 2024-10-02 NOTE — Progress Notes (Signed)
 PROGRESS NOTE  [name] FMW:[id number] DOB: 04/16/1946 DOA: 10/01/2024 PCP: Medicine, [facility] (Inactive)   LOS: 0 days   Brief narrative:   [name] is a 78 y.o. 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  Patient has been seen by oncology Dr. Timmy for her CLL and is currently on Gazyva /Brukinsa .  She had a recent oncology follow-up office visit in 10/7 for worsening back pain and had recently undergone an MRI at [facility] 2 weeks ago on 9/29 which showed acute/subacute superior endplate compression of the L5 infiltrative marrow lesions.  She also had PET scan done 09/27/2024 showed increased uptake in the multiple thoracic lumbar vertebra consistent with metastasis.   Oncology had also noted a nodule under the skin of her left lower leg and concerned about possible transformation to high-grade lymphoma.  Patient has had back pain going on for 3 months but has been worse for the past 3 weeks, not helped with hydrocodone  at home.  Denies any lower extremity weakness or saddle anesthesia.  In the ED patient was noted to be hypertensive.  Labs were unremarkable except for elevated alkaline phosphatase.  Urine analysis was negative.   Patient was given oxycodone  and IV fentanyl  and was considered for admission to the hospital for further evaluation and treatment.  Assessment/Plan: Principal Problem:   Back pain Active Problems:   Hypothyroidism   Essential hypertension   DOE (dyspnea on exertion)   CLL (chronic lymphocytic leukemia) (HCC)  Acute on chronic low back pain CLL currently on treatment Recent MRI of the back with findings suggestive of metastasis..  She has a left lower leg  subcutaneous nodule on exam and oncology concerned about possible transformation to high-grade lymphoma.  Plan was to get an outpatient CT of her left lower extremity with contrast which has not been done so has been ordered.  Oncology following the patient and plan for CT-guided bone marrow biopsy and repeat MRI of the Back.  Continue pain management: IV morphine as needed, Oxy IR as needed, Tylenol  as needed, and lidocaine  patch.   Dyspnea on exertion, cough Lungs clear on exam.  Chest x-ray without infiltrate.    Hypertension Continue pain management and  amlodipine .  Continue as needed IV labetalol PRN SBP >160.   History of DVT/PE in 2019 Continue rivaroxaban .   Hypothyroidism Continue levothyroxine .   RLS Continue ropinirole .   Dementia Continue memantine.  Delirium precautions.   Asthma Stable, no signs of acute exacerbation.  Continue home inhalers.   GERD Continue famotidine .   Glaucoma Continue latanoprost  eyedrops.   Anxiety/depression/insomnia Continue duloxetine, venlafaxine , and trazodone.   Diet controlled type 2 diabetes Hemoglobin A1c was 6.9 in March 2019.  Repeat hemoglobin A1c.   DVT prophylaxis: heparin  injection 5,000 Units Start: 10/02/24 0900   Disposition: Uncertain at this time.  Will get PT OT evaluation.  Status is: Observation The patient will require care spanning > 2 midnights and should be moved to inpatient because: Back pain under workup, repeat MRI and CT pulmonary biopsy, pending clinical improvement    Code Status:     Code Status: Full Code  Family Communication: Spoke with patient's granddaughter at bedside  Consultants: Oncology  Procedures: None.  Anti-infectives:  none  Anti-infectives (From admission, onward)  None        Subjective: Today, patient was seen and examined at bedside.  Patient still complains of back pain which is a little better.  Was able to sleep better today.  Objective: Vitals:    10/02/24 0600 10/02/24 0826  BP: (!) 168/69   Pulse: 90   Resp: 16   Temp: 98.3 F (36.8 C)   SpO2: 96% 95%    Intake/Output Summary (Last 24 hours) at 10/02/2024 9061 Last data filed at 10/02/2024 0200 Gross per 24 hour  Intake 240 ml  Output 200 ml  Net 40 ml   There were no vitals filed for this visit. There is no height or weight on file to calculate BMI.   Physical Exam:  GENERAL: Patient is alert awake and oriented. Not in obvious distress. HENT: No scleral pallor or icterus. Pupils equally reactive to light. Oral mucosa is moist NECK: is supple, no gross swelling noted. CHEST: Clear to auscultation.   Diminished breath sounds bilaterally.  Tenderness on palpation of the back. CVS: S1 and S2 heard, no murmur. Regular rate and rhythm.  ABDOMEN: Soft, non-tender, bowel sounds are present. EXTREMITIES: No edema.  Subcutaneous nodule in the left upper leg. CNS: Cranial nerves are intact.  Moves lower extremities. SKIN: warm and dry without rashes.  Data Review: I have personally reviewed the following laboratory data and studies,  CBC: Recent Labs  Lab 10/01/24 1552  WBC 5.7  NEUTROABS 3.2  HGB 13.3  HCT 41.9  MCV 94.8  PLT 215   Basic Metabolic Panel: Recent Labs  Lab 10/01/24 1552  NA 139  K 4.2  CL 103  CO2 24  GLUCOSE 141*  BUN 11  CREATININE 0.83  CALCIUM 10.2   Liver Function Tests: Recent Labs  Lab 10/01/24 1552  AST 20  ALT 9  ALKPHOS 175*  BILITOT 0.4  PROT 6.5  ALBUMIN 4.1   No results for input(s): LIPASE, AMYLASE in the last 168 hours. No results for input(s): AMMONIA in the last 168 hours. Cardiac Enzymes: No results for input(s): CKTOTAL, CKMB, CKMBINDEX, TROPONINI in the last 168 hours. BNP (last 3 results) No results for input(s): BNP in the last 8760 hours.  ProBNP (last 3 results) No results for input(s): PROBNP in the last 8760 hours.  CBG: No results for input(s): GLUCAP in the last 168 hours. No  results found for this or any previous visit (from the past 240 hours).   Studies: DG CHEST PORT 1 VIEW Result Date: 10/01/2024 CLINICAL DATA:  Shortness of breath, cough EXAM: PORTABLE CHEST 1 VIEW COMPARISON:  07/11/2020 FINDINGS: Linear left basilar atelectasis. Right lung clear. No effusions. Heart and mediastinal contours are within normal limits. No acute bony abnormality. IMPRESSION: Left base atelectasis. No active disease. Electronically Signed   By: Franky Crease M.D.   On: 10/01/2024 21:56      Vernal Alstrom, MD  [facility] 10/02/2024  If 7PM-7AM, please contact night-coverage

## 2024-10-02 NOTE — Progress Notes (Signed)
 Patient BP was 163/78 after checking twice, gave PRN 1mL labetalol, BP rechecked 30 minutes later and was 148/73

## 2024-10-02 NOTE — Consult Note (Addendum)
 This [name] is well-known to me.  She is a very nice 78 year old white female.  She has a history of CLL.  She has had treatment with Gazyva /Brukinsa /venetoclax.  She has done okay.  I think she has responded.  Recently, she began to have a lot of severe back pain.  Again, I am not sure as to what the actual cause of the back pain is.  I have never seen CLL because back pain like this.  She had a bone scan done at an outside facility which showed metastatic disease to the spine.  She is admitted because of severe pain and the need for an urgent workup to figure out where the pain is coming from.  I had her set up with a PET scan but as an inpatient, she is not can be able to get this.  She has a nodule in the left leg.  I am not sure exactly what this is.  However, this is something that I suspect we will probably going to have to biopsy.  The 1 possibility that I can think of is that her leukemia has transformed into a high-grade lymphoma.  Again, I just want her to have more comfort.  She is under a lot of stress.  Her daughter recently had a heart attack.  As such, she has not been able to help her mom out.  Her appetite is marginal.  She does have an element of dementia.  She really cannot tell me a lot.  There is no problems with bowel or bladder incontinence.  She has had no nausea or vomiting.  She has had no problems with cough or shortness of breath.  Her vital signs are all stable.  Blood pressure 168/69.  Temperature 98.3.  Pulse 90.  Her lungs are clear bilaterally.  She has decent air movement bilaterally.  Cardiac exam regular rate and rhythm.  She has no murmurs.  Abdomen is soft.  Bowel sounds are present.  She has no fluid wave.  There is no palpable liver or spleen tip.  Extremities does show this nodule in the upper inner aspect of the left lower leg.  Neurological exam shows no focal neurological deficits.  Again, I am still not clear as to what is causing all of this  pain.  I have never seen CLL do this.  I had to believe that she is responding.  We will get a bone marrow biopsy on her.  I think this could certainly help us  see how the bone marrow looks.  Her blood counts do look all that bad.  Is possible and probably be easiest to get a biopsy of this nodule in the left leg.  She is going to have a CT scan.  I like to get an MRI of the back.  I know this will be a little bit cumbersome for her but we really need to see exactly what is involved with her spine.  I do think that a air mattress bed would not be a bad idea for her.  Hopefully we can get 1 for her.  We will certainly follow along.  I really do appreciate everybody's help on 3 E.  I know this is incredibly challenging.  I do appreciate everybody's compassion.   Jeralyn Crease, MD  [place] 1:5

## 2024-10-03 ENCOUNTER — Observation Stay (HOSPITAL_COMMUNITY)

## 2024-10-03 ENCOUNTER — Ambulatory Visit: Attending: Radiation Oncology | Admitting: Radiation Oncology

## 2024-10-03 DIAGNOSIS — F03918 Unspecified dementia, unspecified severity, with other behavioral disturbance: Secondary | ICD-10-CM | POA: Diagnosis present

## 2024-10-03 DIAGNOSIS — K219 Gastro-esophageal reflux disease without esophagitis: Secondary | ICD-10-CM | POA: Diagnosis present

## 2024-10-03 DIAGNOSIS — J4530 Mild persistent asthma, uncomplicated: Secondary | ICD-10-CM | POA: Diagnosis present

## 2024-10-03 DIAGNOSIS — H409 Unspecified glaucoma: Secondary | ICD-10-CM | POA: Diagnosis present

## 2024-10-03 DIAGNOSIS — M545 Low back pain, unspecified: Secondary | ICD-10-CM | POA: Diagnosis not present

## 2024-10-03 DIAGNOSIS — Z86711 Personal history of pulmonary embolism: Secondary | ICD-10-CM | POA: Diagnosis not present

## 2024-10-03 DIAGNOSIS — M549 Dorsalgia, unspecified: Secondary | ICD-10-CM | POA: Diagnosis present

## 2024-10-03 DIAGNOSIS — Z79890 Hormone replacement therapy (postmenopausal): Secondary | ICD-10-CM | POA: Diagnosis not present

## 2024-10-03 DIAGNOSIS — F32A Depression, unspecified: Secondary | ICD-10-CM | POA: Diagnosis present

## 2024-10-03 DIAGNOSIS — Z7951 Long term (current) use of inhaled steroids: Secondary | ICD-10-CM | POA: Diagnosis not present

## 2024-10-03 DIAGNOSIS — M8448XA Pathological fracture, other site, initial encounter for fracture: Secondary | ICD-10-CM | POA: Diagnosis present

## 2024-10-03 DIAGNOSIS — I10 Essential (primary) hypertension: Secondary | ICD-10-CM | POA: Diagnosis present

## 2024-10-03 DIAGNOSIS — I252 Old myocardial infarction: Secondary | ICD-10-CM | POA: Diagnosis not present

## 2024-10-03 DIAGNOSIS — F0393 Unspecified dementia, unspecified severity, with mood disturbance: Secondary | ICD-10-CM | POA: Diagnosis present

## 2024-10-03 DIAGNOSIS — D6862 Lupus anticoagulant syndrome: Secondary | ICD-10-CM | POA: Diagnosis present

## 2024-10-03 DIAGNOSIS — E039 Hypothyroidism, unspecified: Secondary | ICD-10-CM | POA: Diagnosis present

## 2024-10-03 DIAGNOSIS — E119 Type 2 diabetes mellitus without complications: Secondary | ICD-10-CM | POA: Diagnosis present

## 2024-10-03 DIAGNOSIS — F0394 Unspecified dementia, unspecified severity, with anxiety: Secondary | ICD-10-CM | POA: Diagnosis present

## 2024-10-03 DIAGNOSIS — Z7901 Long term (current) use of anticoagulants: Secondary | ICD-10-CM | POA: Diagnosis not present

## 2024-10-03 DIAGNOSIS — G4700 Insomnia, unspecified: Secondary | ICD-10-CM | POA: Diagnosis present

## 2024-10-03 DIAGNOSIS — C9110 Chronic lymphocytic leukemia of B-cell type not having achieved remission: Secondary | ICD-10-CM | POA: Diagnosis present

## 2024-10-03 DIAGNOSIS — Z86718 Personal history of other venous thrombosis and embolism: Secondary | ICD-10-CM | POA: Diagnosis not present

## 2024-10-03 DIAGNOSIS — Z96651 Presence of right artificial knee joint: Secondary | ICD-10-CM | POA: Diagnosis present

## 2024-10-03 DIAGNOSIS — Z8249 Family history of ischemic heart disease and other diseases of the circulatory system: Secondary | ICD-10-CM | POA: Diagnosis not present

## 2024-10-03 DIAGNOSIS — M48061 Spinal stenosis, lumbar region without neurogenic claudication: Secondary | ICD-10-CM | POA: Diagnosis present

## 2024-10-03 DIAGNOSIS — G2581 Restless legs syndrome: Secondary | ICD-10-CM | POA: Diagnosis present

## 2024-10-03 LAB — MAGNESIUM: Magnesium: 2 mg/dL (ref 1.7–2.4)

## 2024-10-03 LAB — PHOSPHORUS: Phosphorus: 3.4 mg/dL (ref 2.5–4.6)

## 2024-10-03 LAB — CBC
HCT: 40.5 % (ref 36.0–46.0)
Hemoglobin: 12.6 g/dL (ref 12.0–15.0)
MCH: 30.1 pg (ref 26.0–34.0)
MCHC: 31.1 g/dL (ref 30.0–36.0)
MCV: 96.9 fL (ref 80.0–100.0)
Platelets: 199 K/uL (ref 150–400)
RBC: 4.18 MIL/uL (ref 3.87–5.11)
RDW: 14.2 % (ref 11.5–15.5)
WBC: 3.8 K/uL — ABNORMAL LOW (ref 4.0–10.5)
nRBC: 0 % (ref 0.0–0.2)

## 2024-10-03 LAB — COMPREHENSIVE METABOLIC PANEL WITH GFR
ALT: 9 U/L (ref 0–44)
AST: 18 U/L (ref 15–41)
Albumin: 3.8 g/dL (ref 3.5–5.0)
Alkaline Phosphatase: 156 U/L — ABNORMAL HIGH (ref 38–126)
Anion gap: 10 (ref 5–15)
BUN: 8 mg/dL (ref 8–23)
CO2: 24 mmol/L (ref 22–32)
Calcium: 9.4 mg/dL (ref 8.9–10.3)
Chloride: 105 mmol/L (ref 98–111)
Creatinine, Ser: 0.72 mg/dL (ref 0.44–1.00)
GFR, Estimated: >60 mL/min (ref >60)
Glucose, Bld: 113 mg/dL — ABNORMAL HIGH (ref 70–99)
Potassium: 3.8 mmol/L (ref 3.5–5.1)
Sodium: 139 mmol/L (ref 135–145)
Total Bilirubin: 0.4 mg/dL (ref 0.0–1.2)
Total Protein: 6 g/dL — ABNORMAL LOW (ref 6.5–8.1)

## 2024-10-03 LAB — URIC ACID: Uric Acid, Serum: 3.2 mg/dL (ref 2.5–7.1)

## 2024-10-03 LAB — LACTATE DEHYDROGENASE: LDH: 256 U/L — ABNORMAL HIGH (ref 98–192)

## 2024-10-03 MED ORDER — DEXAMETHASONE SODIUM PHOSPHATE 4 MG/ML IJ SOLN
20.0000 mg | INTRAMUSCULAR | Status: DC
  Administered 2024-10-03 – 2024-10-04 (×2): 20 mg via INTRAVENOUS
  Filled 2024-10-03 (×4): qty 5

## 2024-10-03 MED ORDER — HYDRALAZINE HCL 20 MG/ML IJ SOLN: 10.0000 mg | Freq: Four times a day (QID) | INTRAMUSCULAR | Status: DC | PRN

## 2024-10-03 MED ORDER — ZOLEDRONIC ACID 4 MG/5ML IV CONC
4.0000 mg | Freq: Once | INTRAVENOUS | Status: DC
  Filled 2024-10-03: qty 5

## 2024-10-03 MED ORDER — ZOLEDRONIC ACID 4 MG/100ML IV SOLN
4.0000 mg | Freq: Once | INTRAVENOUS | Status: AC
  Administered 2024-10-03: 4 mg via INTRAVENOUS
  Filled 2024-10-03: qty 100

## 2024-10-03 MED ORDER — LIDOCAINE HCL 1 % IJ SOLN
INTRAMUSCULAR | Status: AC
Start: 2024-10-03 — End: 2024-10-03
  Filled 2024-10-03: qty 20

## 2024-10-03 NOTE — Plan of Care (Signed)
  Problem: Clinical Measurements: Goal: Ability to maintain clinical measurements within normal limits will improve Outcome: Progressing Goal: Diagnostic test results will improve Outcome: Progressing   Problem: Activity: Goal: Risk for activity intolerance will decrease Outcome: Progressing   Problem: Coping: Goal: Level of anxiety will decrease Outcome: Progressing   Problem: Pain Managment: Goal: General experience of comfort will improve and/or be controlled Outcome: Progressing   Problem: Safety: Goal: Ability to remain free from injury will improve Outcome: Progressing

## 2024-10-03 NOTE — Progress Notes (Signed)
 Thankfully, her pain is doing better.  The air mattress is really helping around.  She is at least able to get out of bed and go to the bathroom.  She is supposed to have a biopsy of this subcutaneous nodule in the left leg today.  I have to believe that what is in this nodule is also in her spine.  She has had no problems with bowels or bladder.  There has been no bleeding.  She has had no fever.  She has had no cough.  She had the MRI of her back.  Cervical spine looks okay.  She has some fragment at T5 that is pressing on the cord.  I do not see any obvious cord edema.  I do not see any evidence of cord compression.  Again, I have a hard time believing that this is from her having CLL.  Again, her pain is less.    I did talk to her about radiation therapy.  When I talked to her daughter yesterday, she said that there was no way that her mom was going to take radiation therapy.  I explained how radiation therapy works.  I told [name] that it would just be to her spine that I would not think that she would have issues with side effects.  She would not lose her hair.  I would not think it would cause any problems with nausea or vomiting.  He treatment only takes about 10-15 minutes.  I would think that she would only get 2 weeks of treatment.  After hearing this, I think she would be willing to take radiation therapy which clearly is needed.  She is going to have the biopsy today.  For right now, I do not see any neurological issues with respect to her back.  It is still not clear as to what is causing all of this.  Hopefully, the biopsy of the subcutaneous nodule will enlighten us .  She is getting the heparin  for DVT therapy.  I do appreciate everybody's help on 3 E.    Jeralyn Crease, MD  Psalm 23:4

## 2024-10-03 NOTE — Consult Note (Signed)
 Chief Complaint: Chronic Lymphocytic Leukemia - IR consulted for bone marrow biopsy and aspiration  Referring Provider(s): Ennever, Maude SAUNDERS, MD   Supervising Physician: Jenna Hacker  Patient Status: [facility] - In-pt  History of Present Illness: [name] is a 78 y.o. 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 Currently follows with Dr. Timmy of oncology for her CLL and is on Gazyva /Brukinsa . She has been having severely worsening back pain. She had MRI done at [facility] 2 weeks ago showing likely metastases to the spine. Pt was seen by Dr. Timmy here on current admission and pt was found to have nodule on left lower leg, which was biopsied in IR today. He is also requesting a bone marrow biopsy for further current diagnostic evaluation of patients CLL status.  Today pt without complaint, is doing well after left lower leg nodule biopsy, no real pain. Pt to be NPO at midnight for bone marrow bx tomorrow.    Patient is Full Code  Past Medical History:  Diagnosis Date   Allergic rhinitis    Allergic rhinitis    Anxiety    Arthritis    hands, feet, shoulders, arms (02/22/2018)   Bilateral pulmonary embolism (HCC) 02/21/2018   Chronic cough    CLL (chronic lymphocytic leukemia) (HCC) 04/24/2024   Depression    Disease of thyroid  gland    Dupuytren contracture    both feet, both hands (02/22/2018)   DVT (deep venous thrombosis) (HCC)    bil le   GERD (gastroesophageal reflux disease)    Glaucoma    Hypertension    Hypothyroidism    Iron deficiency anemia due to chronic blood loss 05/23/2024   Mild persistent asthma    cougher not a wheezr gets chokes easily   Pneumonia    PONV (postoperative nausea and vomiting)     Past Surgical History:  Procedure Laterality Date    BLADDER SUSPENSION  X 2   BUNIONECTOMY     RIGHT FOOT ARCH REPAIRED AS WELL   CATARACT EXTRACTION W/ INTRAOCULAR LENS  IMPLANT, BILATERAL Bilateral    DILATION AND CURETTAGE OF UTERUS  X 3   DUPUYTREN CONTRACTURE RELEASE Bilateral    feet   FASCIECTOMY Right 01/23/2018   Procedure: SEGMENTAL FASCIECTOMY RIGHT MIDDLE FINGER;  Surgeon: Murrell Kuba, MD;  Location: [facility];  Service: Orthopedics;  Laterality: Right;  AXILLARY BLOCK   fracture surgery right tibia and ankle     KNEE ARTHROSCOPY Right    REDUCTION MAMMAPLASTY Bilateral 2018   TOTAL ABDOMINAL HYSTERECTOMY  1975   got pregnant w/IUD in then lost baby   TOTAL KNEE ARTHROPLASTY Right 06/08/2020   Procedure: TOTAL KNEE ARTHROPLASTY;  Surgeon: Rubie Kemps, MD;  Location: [facility];  Service: Orthopedics;  Laterality: Right;    Allergies: Naproxen and Tizanidine   Medications: Prior to Admission medications   Medication Sig Start Date End Date Taking? Authorizing Provider  ADVAIR DISKUS 250-50 MCG/DOSE AEPB Inhale one puff into the lungs 2 (two) times daily. Patient taking differently: Inhale 1 puff into the lungs in the morning and at bedtime. 10/01/20  Yes Gladis Elsie BROCKS, PA-C  albuterol  (PROAIR  HFA) 108 (90 BASE) MCG/ACT inhaler Inhale 2 puffs into the lungs every 4 (four) hours as needed for wheezing or shortness of breath. 11/20/14  Yes Neysa Rama D, MD  albuterol  (PROVENTIL ) (2.5 MG/3ML)  0.083% nebulizer solution Take 3 mLs (2.5 mg total) by nebulization every 6 (six) hours as needed for wheezing or shortness of breath. 11/26/14  Yes Young, Reggy D, MD  amLODipine  (NORVASC ) 10 MG tablet Take 10 mg by mouth daily.    Yes [provider]  DULoxetine (CYMBALTA) 60 MG capsule Take 60 mg by mouth daily.   Yes [provider]  famciclovir  (FAMVIR ) 250 MG tablet Take 1 tablet (250 mg total) by mouth daily. 05/22/24  Yes Timmy Maude SAUNDERS, MD  famotidine  (PEPCID ) 20 MG tablet Take 20 mg by  mouth at bedtime. 07/17/24  Yes [provider]  fluticasone  (FLONASE ) 50 MCG/ACT nasal spray Place 2 sprays into both nostrils daily. Patient taking differently: Place 2 sprays into both nostrils in the morning. 10/22/21  Yes Martin, Mary-Margaret, FNP  HYDROcodone -acetaminophen  (NORCO) 7.5-325 MG tablet Take 1 tablet by mouth See admin instructions. Take 1 tablet by mouth at 7 AM & 8 PM and additional 1 tablet twice a day as needed for unresolved pain   Yes [provider]  latanoprost  (XALATAN ) 0.005 % ophthalmic solution Place 1 drop into both eyes at bedtime.  08/18/15  Yes [provider]  levothyroxine  (SYNTHROID ) 50 MCG tablet Take 50 mcg by mouth daily before breakfast. 11/02/20  Yes [provider]  meclizine  (ANTIVERT ) 25 MG tablet Take 25 mg by mouth 3 (three) times daily as needed for dizziness. 09/14/20  Yes [provider]  memantine (NAMENDA) 10 MG tablet Take 10 mg by mouth in the morning and at bedtime. 05/01/24  Yes [provider]  montelukast  (SINGULAIR ) 10 MG tablet Take 10 mg by mouth at bedtime.   Yes [provider]  ondansetron  (ZOFRAN ) 8 MG tablet Take 1 tablet (8 mg total) by mouth every 8 (eight) hours as needed for nausea or vomiting. 05/27/24  Yes Timmy Maude SAUNDERS, MD  pantoprazole  (PROTONIX ) 40 MG tablet Take 40 mg by mouth at bedtime. 08/29/15  Yes [provider]  prochlorperazine  (COMPAZINE ) 10 MG tablet Take 1 tablet (10 mg total) by mouth every 6 (six) hours as needed for nausea or vomiting. 05/27/24  Yes Timmy Maude SAUNDERS, MD  rOPINIRole  (REQUIP ) 2 MG tablet Take 3 mg by mouth at bedtime. 11/10/14  Yes [provider]  traZODone (DESYREL) 50 MG tablet Take 50 mg by mouth at bedtime. 04/04/24 10/01/24 Yes [provider]  TYLENOL  500 MG tablet Take 1,000 mg by mouth every 8 (eight) hours as needed (for pain or headaches).   Yes [provider]  Vitamin D, Ergocalciferol,  (DRISDOL) 1.25 MG (50000 UNIT) CAPS capsule Take 50,000 Units by mouth every Sunday. 05/01/24  Yes [provider]  XARELTO  10 MG TABS tablet TAKE ONE TABLET BY MOUTH EVERY DAY WITH SUPPER Patient taking differently: Take 10 mg by mouth daily with supper. 09/26/24  Yes Timmy Maude SAUNDERS, MD  zanubrutinib  (BRUKINSA ) 80 MG capsule Take 2 capsules (160 mg total) by mouth 2 (two) times daily. 05/22/24  Yes Ennever, Maude SAUNDERS, MD  cetirizine  (ZYRTEC ) 10 MG tablet Take 10 mg by mouth daily.    [provider]  ferrous sulfate  324 MG TBEC Take 324 mg by mouth daily with breakfast.    [provider]  methocarbamol (ROBAXIN) 500 MG tablet Take 500 mg by mouth 2 (two) times daily as needed for muscle spasms.    [provider]  traMADol  (ULTRAM ) 50 MG tablet Take 1 tablet (50 mg total) by mouth every  6 (six) hours as needed. Patient not taking: Reported on 10/01/2024 08/21/24   Timmy Maude SAUNDERS, MD     Family History  Problem Relation Age of Onset   Hypertension Mother    Arthritis Mother    Breast cancer Mother    Colon cancer Mother    Colon polyps Mother    Diabetes Father    Pneumonia Father    Heart attack Father    Heart disease Father    Dementia Father    Diabetes Brother    Breast cancer Maternal Aunt    Sleep apnea Neg Hx     Social History   Socioeconomic History   Marital status: Widowed    Spouse name: Not on file   Number of children: 2   Years of education: 33   Highest education level: Not on file  Occupational History   Occupation: Retired  Tobacco Use   Smoking status: Never   Smokeless tobacco: Never  Vaping Use   Vaping status: Never Used  Substance and Sexual Activity   Alcohol use: Not Currently    Comment: 02/22/2018 might have a drink q couple years   Drug use: No   Sexual activity: Not Currently  Other Topics Concern   Not on file  Social History Narrative   ** Merged History Encounter **       Lives at home with her  brother. Right-handed. 3-4 cups coffee per day.   Social Drivers of Corporate investment banker Strain: Low Risk  (08/02/2024)   Received from [facility]   Overall Financial Resource Strain (CARDIA)    How hard is it for you to pay for the very basics like food, housing, medical care, and heating?: Not hard at all  Food Insecurity: No Food Insecurity (10/02/2024)   Hunger Vital Sign    Worried About Running Out of Food in the Last Year: Never true    Ran Out of Food in the Last Year: Never true  Transportation Needs: No Transportation Needs (10/02/2024)   PRAPARE - Administrator, Civil Service (Medical): No    Lack of Transportation (Non-Medical): No  Physical Activity: Inactive (08/02/2024)   Received from [facility]   Exercise Vital Sign    On average, how many days per week do you engage in moderate to strenuous exercise (like a brisk walk)?: 0 days    Minutes of Exercise per Session: Not on file  Stress: Stress Concern Present (08/02/2024)   Received from [facility] - Occupational Stress Questionnaire    Do you feel stress - tense, restless, nervous, or anxious, or unable to sleep at night because your mind is troubled all the time - these days?: To some extent  Social Connections: Socially Isolated (10/02/2024)   Social Connection and Isolation Panel    Frequency of Communication with Friends and Family: Three times a week    Frequency of Social Gatherings with Friends and Family: Never    Attends Religious Services: Never    Database administrator or Organizations: No    Attends Banker Meetings: Never    Marital Status: Widowed     Review of Systems: A 12 point ROS discussed and pertinent positives are indicated in the HPI above.  All other systems are negative.   Vital Signs: BP (!) 159/80 (BP Location: Left Arm)   Pulse (!) 105   Temp 97.9 F (36.6 C) (Oral)  Resp 18   SpO2 97%    Advance Care Plan: No documents on file  Physical Exam Vitals and nursing note reviewed.  Constitutional:      Appearance: Normal appearance.  HENT:     Mouth/Throat:     Mouth: Mucous membranes are moist.     Pharynx: Oropharynx is clear.  Cardiovascular:     Rate and Rhythm: Regular rhythm. Tachycardia present.  Pulmonary:     Effort: Pulmonary effort is normal.     Breath sounds: Normal breath sounds.  Abdominal:     Palpations: Abdomen is soft.     Tenderness: There is no abdominal tenderness.  Musculoskeletal:     Right lower leg: No edema.     Left lower leg: No edema.  Skin:    General: Skin is warm and dry.     Comments: + palpable mobile nodule of left lower leg. Visible but no overlying abnormality.  Neurological:     Mental Status: She is alert and oriented to person, place, and time. Mental status is at baseline.     Imaging: US  CORE BIOPSY (SOFT TISSUE) Result Date: 10/03/2024 CLINICAL DATA:  Soft tissue nodule left lower extremity. EXAM: MUSCLE/SOFT TISSUE CORE BIOPSY ANESTHESIA/SEDATION: 1% lidocaine  MEDICATIONS: None PROCEDURE: None COMPLICATIONS: None immediate FINDINGS: Ultrasound imaging of the pretibial region in the left lower extremity demonstrates an irregular soft tissue nodule in the subcutaneous tissue with a hypoechoic with center and hyperechoic border with minimal vascularity. The left lower extremity was prepped and draped in usual sterile fashion. Local anesthesia was achieved by infiltrating subcutaneous tissue with 1% lidocaine . A small incision was created in the patient's skin and the 18 gauge super core biopsy needle was advanced under ultrasound guidance until the needle tip was on the proximal edge. The biopsy needle was deployed and a sample was obtained. Samples placed in formalin. A second sample was obtained in similar fashion. Post biopsy color flow images demonstrate no active hemorrhage. Sterile dressing applied. IMPRESSION:  Satisfactory core needle biopsy of a left lower extremity soft tissue nodule as described above. Electronically Signed   By: Cordella Banner   On: 10/03/2024 10:49   MR Lumbar Spine W Wo Contrast Result Date: 10/02/2024 EXAM: MRI CERVICAL, THORACIC, AND LUMBAR SPINE WITH AND WITHOUT CONTRAST 10/02/2024 12:34:56 PM TECHNIQUE: Multiplanar multisequence MRI of the cervical, thoracic, and lumbar spine was performed without and with the administration of 6 mL gadobutrol  (GADAVIST ) 1 MMOL/ML injection. COMPARISON: None available. CLINICAL HISTORY: Hematologic malignancy, monitor; History of CLL. On treatment. Now with severe back pain. MR Thoracic, Lumbar and Cervical w/wo: 6 ml Gadavist  (MULTIPLE, MULTIPLE REMINDERS TO HOLD STILL. PT. CONTINUED TO MOVE. WAS MEDICATED); Hx of CLL. Pt is here for pain management. Pt has back lower and upper back pain. FINDINGS: BONES AND ALIGNMENT: Cervical spine: Straightening of the normal cervical lordosis. No suspicious osseous lesions. Normal vertebral body heights. Marrow signal is unremarkable. No abnormal enhancement. Thoracic spine: There is edema throughout the T5 vertebral body with irregularity of the endplates and up to 80% height loss centrally compatible with acute compression fracture. There is approximately 3 mm retropulsion at the superior endplate of T5. Additional mild chronic deformity of the T3 vertebral body with mild height loss. Additional edema in the T10 through T12 vertebral bodies with irregularity of the endplates and associated height loss. There is approximately 50% height loss of the T11 vertebral body with 2 mm retropulsion at the superior endplate. Additional 55% height loss of the  T12 vertebral body centrally without significant retropulsion. There is associated enhancement in the T10 through T12 vertebral bodies. For the remaining thoracic vertebral bodies (T1-T2, T4, T6-T9), vertebral body heights are normal, marrow signal is unremarkable, and  there is no abnormal enhancement. Lumbar spine: Signal abnormality in the superior aspect of the L1 vertebral body with associated edema and irregularity of the superior endplate concerning for compression fracture with minimal height loss. There is additional irregularity of the L2 superior endplate with associated edema with no more than 20% height loss. Irregularity of the L5 superior endplate with up to 20% height loss and edema and mild enhancement which may reflect additional compression fracture. No significant retropulsion in the lumbar spine. For the remaining lumbar vertebral bodies (L3, L4), vertebral body heights are normal, marrow signal is unremarkable, and there is no abnormal enhancement. Overall: Degenerative endplate changes at multiple levels. SPINAL CORD: Cervical spine: Mild flattening of the ventral cervical cord at C5-C6 and C6-C7. Thoracic spine: Retropulsion of fracture fragments of T5 causes indentation of the thecal sac with mild flattening of the ventral cord. No definite cord signal abnormality at this level. There is mild dural enhancement along the ventral and dorsal aspects of the spinal canal from T9 to the level of T11 and T12 which may be reactive in the setting of acute fracture. There is no evidence of nodule or mass-like enhancement. Lumbar spine: The conus medullaris extends to the L1-L2 level. SOFT TISSUES: Cervical: Unremarkable. Thoracic: Unremarkable. Lumbar: Cyst in the posterior aspect of the left kidney. CERVICAL DISC LEVELS: C2-C3: Small disc bulge without significant spinal canal or foraminal stenosis. C3-C4: No significant spinal canal stenosis. Facet arthrosis slightly greater on the left. Mild left foraminal stenosis. C4-C5: Small disc bulge. Bilateral facet arthrosis. No significant spinal canal or foraminal stenosis. C5-C6: Diffuse disc bulge which indents the ventral thecal sac and likely contacts the ventral cervical cord with mild flattening of the cord.  Bilateral facet arthrosis. Uncovertebral hypertrophy on the left. Mild left foraminal stenosis. C6-C7: Diffuse disc bulge which indents the ventral thecal sac with mild flattening of the ventral cervical cord and bilateral facet arthrosis. No significant foraminal stenosis. C7-T1: No significant spinal canal or foraminal stenosis. THORACIC DISC LEVELS: T5-T6: Retropulsion of fracture fragments and facet arthrosis contributes to moderate foraminal stenosis bilaterally. T6-T7 through T8-T9: Multiple shallow disc bulges without significant spinal canal stenosis. Facet arthrosis at multiple levels. T10-T11: Additional moderate bilateral foraminal stenosis. Other thoracic levels: T1-T2, T2-T3, T3-T4, T4-T5, T9-T10, T11-T12: No significant disc herniation. No spinal canal stenosis or neural foraminal narrowing. LUMBAR DISC LEVELS: T12-L1: Mild facet arthrosis. No significant spinal canal or foraminal stenosis. L1-L2: Small disc bulge and mild facet arthrosis. No significant spinal canal or foraminal stenosis. L2-L3: Moderate disc height loss. Diffuse disc bulge. Small right subarticular disc protrusion. Mild to moderate facet arthrosis. No significant spinal canal stenosis or foraminal stenosis. L3-L4: Mild to moderate disc height loss. Diffuse disc bulge. Moderate facet arthrosis. Mild lateral recess narrowing and mild spinal canal stenosis. Bilateral foraminal disc protrusions. Mild bilateral foraminal stenosis slightly greater on the left. L4-L5: Diffuse disc bulge. Small central annular fissure. Mild lateral recess narrowing. Moderate facet arthrosis with bilateral facet effusions. Mild spinal canal stenosis. Mild foraminal stenosis on the left. L5-S1: Moderate to severe disc height loss. Small disc bulge. Mild to moderate facet arthrosis. No significant spinal canal stenosis. Mild right and moderate left foraminal stenosis. IMPRESSION: 1. Acute compression fracture of T5 with up to 80% height loss  and 3 mm  retropulsion, causing indentation of the thecal sac with mild ventral cord flattening and contributing to moderate bilateral foraminal stenosis at T5-T6. 2. Acute/subacute compression fractures involving T10T12 with edema and enhancement, including approximately 50% height loss at T11 with 2 mm retropulsion and 55% height loss at T12 without significant retropulsion. 3. Additional probable subacute compression fractures at L2 and L5. Probable minimal superior endplate compression at L1. 4. Findings concerning for pathologic fractures in the setting of known hematologic malignancy. 5. Degenerative changes as above. No high-grade spinal canal stenosis. Moderate foraminal stenosis at multiple levels. Electronically signed by: Donnice Mania MD 10/02/2024 01:51 PM EDT RP Workstation: [id number]   MR THORACIC SPINE W WO CONTRAST Result Date: 10/02/2024 EXAM: MRI CERVICAL, THORACIC, AND LUMBAR SPINE WITH AND WITHOUT CONTRAST 10/02/2024 12:34:56 PM TECHNIQUE: Multiplanar multisequence MRI of the cervical, thoracic, and lumbar spine was performed without and with the administration of 6 mL gadobutrol  (GADAVIST ) 1 MMOL/ML injection. COMPARISON: None available. CLINICAL HISTORY: Hematologic malignancy, monitor; History of CLL. On treatment. Now with severe back pain. MR Thoracic, Lumbar and Cervical w/wo: 6 ml Gadavist  (MULTIPLE, MULTIPLE REMINDERS TO HOLD STILL. PT. CONTINUED TO MOVE. WAS MEDICATED); Hx of CLL. Pt is here for pain management. Pt has back lower and upper back pain. FINDINGS: BONES AND ALIGNMENT: Cervical spine: Straightening of the normal cervical lordosis. No suspicious osseous lesions. Normal vertebral body heights. Marrow signal is unremarkable. No abnormal enhancement. Thoracic spine: There is edema throughout the T5 vertebral body with irregularity of the endplates and up to 80% height loss centrally compatible with acute compression fracture. There is approximately 3 mm retropulsion at the superior  endplate of T5. Additional mild chronic deformity of the T3 vertebral body with mild height loss. Additional edema in the T10 through T12 vertebral bodies with irregularity of the endplates and associated height loss. There is approximately 50% height loss of the T11 vertebral body with 2 mm retropulsion at the superior endplate. Additional 55% height loss of the T12 vertebral body centrally without significant retropulsion. There is associated enhancement in the T10 through T12 vertebral bodies. For the remaining thoracic vertebral bodies (T1-T2, T4, T6-T9), vertebral body heights are normal, marrow signal is unremarkable, and there is no abnormal enhancement. Lumbar spine: Signal abnormality in the superior aspect of the L1 vertebral body with associated edema and irregularity of the superior endplate concerning for compression fracture with minimal height loss. There is additional irregularity of the L2 superior endplate with associated edema with no more than 20% height loss. Irregularity of the L5 superior endplate with up to 20% height loss and edema and mild enhancement which may reflect additional compression fracture. No significant retropulsion in the lumbar spine. For the remaining lumbar vertebral bodies (L3, L4), vertebral body heights are normal, marrow signal is unremarkable, and there is no abnormal enhancement. Overall: Degenerative endplate changes at multiple levels. SPINAL CORD: Cervical spine: Mild flattening of the ventral cervical cord at C5-C6 and C6-C7. Thoracic spine: Retropulsion of fracture fragments of T5 causes indentation of the thecal sac with mild flattening of the ventral cord. No definite cord signal abnormality at this level. There is mild dural enhancement along the ventral and dorsal aspects of the spinal canal from T9 to the level of T11 and T12 which may be reactive in the setting of acute fracture. There is no evidence of nodule or mass-like enhancement. Lumbar spine: The  conus medullaris extends to the L1-L2 level. SOFT TISSUES: Cervical: Unremarkable. Thoracic: Unremarkable. Lumbar:  Cyst in the posterior aspect of the left kidney. CERVICAL DISC LEVELS: C2-C3: Small disc bulge without significant spinal canal or foraminal stenosis. C3-C4: No significant spinal canal stenosis. Facet arthrosis slightly greater on the left. Mild left foraminal stenosis. C4-C5: Small disc bulge. Bilateral facet arthrosis. No significant spinal canal or foraminal stenosis. C5-C6: Diffuse disc bulge which indents the ventral thecal sac and likely contacts the ventral cervical cord with mild flattening of the cord. Bilateral facet arthrosis. Uncovertebral hypertrophy on the left. Mild left foraminal stenosis. C6-C7: Diffuse disc bulge which indents the ventral thecal sac with mild flattening of the ventral cervical cord and bilateral facet arthrosis. No significant foraminal stenosis. C7-T1: No significant spinal canal or foraminal stenosis. THORACIC DISC LEVELS: T5-T6: Retropulsion of fracture fragments and facet arthrosis contributes to moderate foraminal stenosis bilaterally. T6-T7 through T8-T9: Multiple shallow disc bulges without significant spinal canal stenosis. Facet arthrosis at multiple levels. T10-T11: Additional moderate bilateral foraminal stenosis. Other thoracic levels: T1-T2, T2-T3, T3-T4, T4-T5, T9-T10, T11-T12: No significant disc herniation. No spinal canal stenosis or neural foraminal narrowing. LUMBAR DISC LEVELS: T12-L1: Mild facet arthrosis. No significant spinal canal or foraminal stenosis. L1-L2: Small disc bulge and mild facet arthrosis. No significant spinal canal or foraminal stenosis. L2-L3: Moderate disc height loss. Diffuse disc bulge. Small right subarticular disc protrusion. Mild to moderate facet arthrosis. No significant spinal canal stenosis or foraminal stenosis. L3-L4: Mild to moderate disc height loss. Diffuse disc bulge. Moderate facet arthrosis. Mild lateral recess  narrowing and mild spinal canal stenosis. Bilateral foraminal disc protrusions. Mild bilateral foraminal stenosis slightly greater on the left. L4-L5: Diffuse disc bulge. Small central annular fissure. Mild lateral recess narrowing. Moderate facet arthrosis with bilateral facet effusions. Mild spinal canal stenosis. Mild foraminal stenosis on the left. L5-S1: Moderate to severe disc height loss. Small disc bulge. Mild to moderate facet arthrosis. No significant spinal canal stenosis. Mild right and moderate left foraminal stenosis. IMPRESSION: 1. Acute compression fracture of T5 with up to 80% height loss and 3 mm retropulsion, causing indentation of the thecal sac with mild ventral cord flattening and contributing to moderate bilateral foraminal stenosis at T5-T6. 2. Acute/subacute compression fractures involving T10T12 with edema and enhancement, including approximately 50% height loss at T11 with 2 mm retropulsion and 55% height loss at T12 without significant retropulsion. 3. Additional probable subacute compression fractures at L2 and L5. Probable minimal superior endplate compression at L1. 4. Findings concerning for pathologic fractures in the setting of known hematologic malignancy. 5. Degenerative changes as above. No high-grade spinal canal stenosis. Moderate foraminal stenosis at multiple levels. Electronically signed by: Donnice Mania MD 10/02/2024 01:51 PM EDT RP Workstation: [id number]   MR CERVICAL SPINE W WO CONTRAST Result Date: 10/02/2024 EXAM: MRI CERVICAL, THORACIC, AND LUMBAR SPINE WITH AND WITHOUT CONTRAST 10/02/2024 12:34:56 PM TECHNIQUE: Multiplanar multisequence MRI of the cervical, thoracic, and lumbar spine was performed without and with the administration of 6 mL gadobutrol  (GADAVIST ) 1 MMOL/ML injection. COMPARISON: None available. CLINICAL HISTORY: Hematologic malignancy, monitor; History of CLL. On treatment. Now with severe back pain. MR Thoracic, Lumbar and Cervical w/wo: 6 ml  Gadavist  (MULTIPLE, MULTIPLE REMINDERS TO HOLD STILL. PT. CONTINUED TO MOVE. WAS MEDICATED); Hx of CLL. Pt is here for pain management. Pt has back lower and upper back pain. FINDINGS: BONES AND ALIGNMENT: Cervical spine: Straightening of the normal cervical lordosis. No suspicious osseous lesions. Normal vertebral body heights. Marrow signal is unremarkable. No abnormal enhancement. Thoracic spine: There is edema throughout the T5  vertebral body with irregularity of the endplates and up to 80% height loss centrally compatible with acute compression fracture. There is approximately 3 mm retropulsion at the superior endplate of T5. Additional mild chronic deformity of the T3 vertebral body with mild height loss. Additional edema in the T10 through T12 vertebral bodies with irregularity of the endplates and associated height loss. There is approximately 50% height loss of the T11 vertebral body with 2 mm retropulsion at the superior endplate. Additional 55% height loss of the T12 vertebral body centrally without significant retropulsion. There is associated enhancement in the T10 through T12 vertebral bodies. For the remaining thoracic vertebral bodies (T1-T2, T4, T6-T9), vertebral body heights are normal, marrow signal is unremarkable, and there is no abnormal enhancement. Lumbar spine: Signal abnormality in the superior aspect of the L1 vertebral body with associated edema and irregularity of the superior endplate concerning for compression fracture with minimal height loss. There is additional irregularity of the L2 superior endplate with associated edema with no more than 20% height loss. Irregularity of the L5 superior endplate with up to 20% height loss and edema and mild enhancement which may reflect additional compression fracture. No significant retropulsion in the lumbar spine. For the remaining lumbar vertebral bodies (L3, L4), vertebral body heights are normal, marrow signal is unremarkable, and there is  no abnormal enhancement. Overall: Degenerative endplate changes at multiple levels. SPINAL CORD: Cervical spine: Mild flattening of the ventral cervical cord at C5-C6 and C6-C7. Thoracic spine: Retropulsion of fracture fragments of T5 causes indentation of the thecal sac with mild flattening of the ventral cord. No definite cord signal abnormality at this level. There is mild dural enhancement along the ventral and dorsal aspects of the spinal canal from T9 to the level of T11 and T12 which may be reactive in the setting of acute fracture. There is no evidence of nodule or mass-like enhancement. Lumbar spine: The conus medullaris extends to the L1-L2 level. SOFT TISSUES: Cervical: Unremarkable. Thoracic: Unremarkable. Lumbar: Cyst in the posterior aspect of the left kidney. CERVICAL DISC LEVELS: C2-C3: Small disc bulge without significant spinal canal or foraminal stenosis. C3-C4: No significant spinal canal stenosis. Facet arthrosis slightly greater on the left. Mild left foraminal stenosis. C4-C5: Small disc bulge. Bilateral facet arthrosis. No significant spinal canal or foraminal stenosis. C5-C6: Diffuse disc bulge which indents the ventral thecal sac and likely contacts the ventral cervical cord with mild flattening of the cord. Bilateral facet arthrosis. Uncovertebral hypertrophy on the left. Mild left foraminal stenosis. C6-C7: Diffuse disc bulge which indents the ventral thecal sac with mild flattening of the ventral cervical cord and bilateral facet arthrosis. No significant foraminal stenosis. C7-T1: No significant spinal canal or foraminal stenosis. THORACIC DISC LEVELS: T5-T6: Retropulsion of fracture fragments and facet arthrosis contributes to moderate foraminal stenosis bilaterally. T6-T7 through T8-T9: Multiple shallow disc bulges without significant spinal canal stenosis. Facet arthrosis at multiple levels. T10-T11: Additional moderate bilateral foraminal stenosis. Other thoracic levels: T1-T2,  T2-T3, T3-T4, T4-T5, T9-T10, T11-T12: No significant disc herniation. No spinal canal stenosis or neural foraminal narrowing. LUMBAR DISC LEVELS: T12-L1: Mild facet arthrosis. No significant spinal canal or foraminal stenosis. L1-L2: Small disc bulge and mild facet arthrosis. No significant spinal canal or foraminal stenosis. L2-L3: Moderate disc height loss. Diffuse disc bulge. Small right subarticular disc protrusion. Mild to moderate facet arthrosis. No significant spinal canal stenosis or foraminal stenosis. L3-L4: Mild to moderate disc height loss. Diffuse disc bulge. Moderate facet arthrosis. Mild lateral recess narrowing  and mild spinal canal stenosis. Bilateral foraminal disc protrusions. Mild bilateral foraminal stenosis slightly greater on the left. L4-L5: Diffuse disc bulge. Small central annular fissure. Mild lateral recess narrowing. Moderate facet arthrosis with bilateral facet effusions. Mild spinal canal stenosis. Mild foraminal stenosis on the left. L5-S1: Moderate to severe disc height loss. Small disc bulge. Mild to moderate facet arthrosis. No significant spinal canal stenosis. Mild right and moderate left foraminal stenosis. IMPRESSION: 1. Acute compression fracture of T5 with up to 80% height loss and 3 mm retropulsion, causing indentation of the thecal sac with mild ventral cord flattening and contributing to moderate bilateral foraminal stenosis at T5-T6. 2. Acute/subacute compression fractures involving T10T12 with edema and enhancement, including approximately 50% height loss at T11 with 2 mm retropulsion and 55% height loss at T12 without significant retropulsion. 3. Additional probable subacute compression fractures at L2 and L5. Probable minimal superior endplate compression at L1. 4. Findings concerning for pathologic fractures in the setting of known hematologic malignancy. 5. Degenerative changes as above. No high-grade spinal canal stenosis. Moderate foraminal stenosis at multiple  levels. Electronically signed by: Donnice Mania MD 10/02/2024 01:50 PM EDT RP Workstation: [id number]   CT TIBIA FIBULA LEFT W CONTRAST Result Date: 10/02/2024 EXAM: CT Left Lower Extremity, With IV Contrast, 10/02/2024 12:58:34 PM TECHNIQUE: Axial images were acquired through the left lower extremity with IV contrast. Reformatted images were reviewed. Automated exposure control, iterative reconstruction, and/or weight based adjustment of the mA/kV was utilized to reduce the radiation dose to as low as reasonably achievable. COMPARISON: None available. CLINICAL HISTORY: Subcutaneous nodules of the calf. History of chronic lymphocytic leukemia. FINDINGS: BONES AND JOINTS: Severe osteoarthritis of the knee is suspected with a small free osteochondral fragment anteriorly in the knee joint just anterior to the tibial spine. 2 chronic lag screws are present in the medial malleolus. No acute bony findings. SOFT TISSUES: The soft tissues are remarkable for a 1.5 x 1.5 x 1.8 cm soft tissue nodule in the subcutaneous tissues of the proximal calf, present on image 44, series 9. This nodule has a more solid central component measuring 0.6 x 1.5 cm and a peripheral fatty infiltrate component. Possibilities may include inflammatory lesions or subcutaneous malignancy. Consider biopsy. Additionally, there are smaller clustered nodules on images 49 through 63 of series 6 in the medial proximal calf subcutaneous tissues, with the index nodule measuring 0.6 cm in diameter on image 54, series 6. A small knee joint effusion is also present. IMPRESSION: 1. Severe osteoarthritis of the left knee with suspected small free osteochondral fragment anterior to the tibial spine. 2. Soft tissue nodule in the subcutaneous tissues of the proximal left calf (1.5 x 1.5 x 1.8 cm) with solid central component and peripheral fatty component; inflammatory lesion or subcutaneous malignancy considered. Consider biopsy. 3. Smaller clustered  subcutaneous nodules in the medial proximal left calf, index nodule 0.6 cm. 4. Small left knee joint effusion. 5. Chronic lag screws in the medial malleolus. Electronically signed by: Ryan Salvage MD 10/02/2024 01:29 PM EDT RP Workstation: [id number]   DG CHEST PORT 1 VIEW Result Date: 10/01/2024 CLINICAL DATA:  Shortness of breath, cough EXAM: PORTABLE CHEST 1 VIEW COMPARISON:  07/11/2020 FINDINGS: Linear left basilar atelectasis. Right lung clear. No effusions. Heart and mediastinal contours are within normal limits. No acute bony abnormality. IMPRESSION: Left base atelectasis. No active disease. Electronically Signed   By: Franky Crease M.D.   On: 10/01/2024 21:56    Labs:  CBC: Recent Labs  08/28/24 0813 09/24/24 0823 10/01/24 1552 10/03/24 0704  WBC 5.9 6.2 5.7 3.8*  HGB 12.8 13.4 13.3 12.6  HCT 37.6 40.2 41.9 40.5  PLT 355 302 215 199    COAGS: No results for input(s): INR, APTT in the last 8760 hours.  BMP: Recent Labs    08/28/24 0813 09/24/24 0823 10/01/24 1552 10/03/24 0704  NA 139 140 139 139  K 4.3 4.4 4.2 3.8  CL 102 103 103 105  CO2 25 24 24 24   GLUCOSE 130* 118* 141* 113*  BUN 14 11 11 8   CALCIUM 9.9 10.3 10.2 9.4  CREATININE 0.78 1.01* 0.83 0.72  GFRNONAA >60 57* >60 >60    LIVER FUNCTION TESTS: Recent Labs    08/28/24 0813 09/24/24 0823 10/01/24 1552 10/03/24 0704  BILITOT 0.6 0.5 0.4 0.4  AST 18 22 20 18   ALT 12 11 9 9   ALKPHOS 147* 177* 175* 156*  PROT 6.7 6.8 6.5 6.0*  ALBUMIN 3.9 4.2 4.1 3.8    TUMOR MARKERS: No results for input(s): AFPTM, CEA, CA199, CHROMGRNA in the last 8760 hours.  Assessment and Plan:  TIONNA GIGANTE is a 78 y.o. 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 Currently follows with Dr. Timmy of  oncology for her CLL and is on Gazyva /Brukinsa . She has been having severely worsening back pain. She had MRI done at [facility] 2 weeks ago showing likely metastases to the spine. Pt was seen by Dr. Timmy here on current admission and pt was found to have nodule on left lower leg, which was biopsied in IR today. He is also requesting a bone marrow biopsy for further current diagnostic evaluation of patients CLL status.  Risks and benefits of bone marrow biopsy was discussed with the patient and/or patient's family including, but not limited to bleeding, infection, damage to adjacent structures or low yield requiring additional tests.  All of the questions were answered and there is agreement to proceed.  Consent signed and in chart.   Thank you for allowing our service to participate in [name] 's care.  Electronically Signed: Kimble VEAR Clas, PA-C   10/03/2024, 2:57 PM      I spent a total of 20 Minutes    in face to face in clinical consultation, greater than 50% of which was counseling/coordinating care for bone marrow biopsy and aspiration

## 2024-10-03 NOTE — Evaluation (Signed)
 Physical Therapy Evaluation Patient Details Name: [name] MRN: [id number] DOB: 26-Dec-1945 Today's Date: 10/03/2024  History of Present Illness  78 y.o. female  presented to the to the hospital with worsening back pain.  Recent MRI at [facility] 2 weeks ago on 9/29 which showed acute/subacute superior endplate compression of the L5 infiltrative marrow lesions.  She also had PET scan done 09/27/2024 showed increased uptake in the multiple thoracic lumbar vertebra consistent with metastasis. 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 Pt has a RW at home and stated she will have needed level of assistance from her daughter and cousin. As pt is independent with mobility PT is signing off, Mobility team to follow.         If plan is discharge home, recommend the following: Assist for transportation   Can travel by private vehicle        Equipment Recommendations None recommended by PT  Recommendations for Other Services       Functional Status Assessment Patient has not had a recent decline in their functional status     Precautions / Restrictions Precautions Precautions: Fall Recall of Precautions/Restrictions: Intact Restrictions Weight Bearing Restrictions Per Provider Order: No      Mobility  Bed Mobility Overal bed mobility: Modified Independent             General bed mobility comments: HOB up, used rail    Transfers Overall transfer level: Needs assistance Equipment used: Rolling walker (2 wheels)               General transfer comment: VCs hand placement    Ambulation/Gait Ambulation/Gait  assistance: modified independent Gait Distance (Feet): 100 Feet Assistive device: Rolling walker (2 wheels) Gait Pattern/deviations: WFL(Within Functional Limits) Gait velocity: decr     General Gait Details: steady, no loss of balance  Stairs            Wheelchair Mobility     Tilt Bed    Modified Rankin (Stroke Patients Only)       Balance Overall balance assessment: History of Falls, Modified Independent                                           Pertinent Vitals/Pain Pain Assessment Pain Assessment: 0-10 Pain Score: 2  Pain Location: low back Pain Descriptors / Indicators: Sore Pain Intervention(s): Limited activity within patient's tolerance, Monitored during session    Home Living Family/patient expects to be discharged to:: Private residence Living Arrangements: Alone Available Help at Discharge: Family;Available PRN/intermittently Type of Home: House Home Access: Stairs to enter Entrance Stairs-Rails: Doctor, general practice of Steps: 6   Home Layout: One level Home Equipment: Agricultural consultant (2 wheels);Cane - single point;Shower seat;Toilet riser Additional Comments: daughter lives next door, she works from home, can help prn; cousin also available to assist    Prior Function Prior Level of Function : History of Falls (last six months);Independent/Modified Independent             Mobility Comments: walks with SPC at home, fell 3 months ago ADLs Comments: independent, uses shower seat;  rarely drives     Extremity/Trunk Assessment   Upper Extremity Assessment Upper Extremity Assessment: Overall WFL for tasks assessed    Lower Extremity Assessment Lower Extremity Assessment: Overall WFL for tasks assessed    Cervical / Trunk Assessment Cervical / Trunk Assessment: Normal  Communication   Communication Communication: No apparent difficulties    Cognition Arousal: Alert Behavior During Therapy: WFL for  tasks assessed/performed   PT - Cognitive impairments: No apparent impairments                         Following commands: Intact       Cueing       General Comments      Exercises     Assessment/Plan    PT Assessment Patient does not need any further PT services  PT Problem List         PT Treatment Interventions      PT Goals (Current goals can be found in the Care Plan section)  Acute Rehab PT Goals PT Goal Formulation: All assessment and education complete, DC therapy    Frequency       Co-evaluation               AM-PAC PT 6 Clicks Mobility  Outcome Measure Help needed turning from your back to your side while in a flat bed without using bedrails?: None Help needed moving from lying on your back to sitting on the side of a flat bed without using bedrails?: None Help needed moving to and from a bed to a chair (including a wheelchair)?: None Help needed standing up from a chair using your arms (e.g., wheelchair or bedside chair)?: None Help needed to walk in hospital room?: None Help needed climbing 3-5 steps with a railing? : None 6 Click Score: 24    End of Session Equipment Utilized During Treatment: Gait belt Activity Tolerance: Patient tolerated treatment well Patient left: in bed;with call bell/phone within reach;with family/visitor present Nurse Communication: Mobility status      Time: 8841-8784 PT Time Calculation (min) (ACUTE ONLY): 17 min   Charges:   PT Evaluation $PT Eval Low Complexity: 1 Low   PT General Charges $$ ACUTE PT VISIT: 1 Visit        Sylvan Delon Copp PT 10/03/2024  Acute Rehabilitation Services  Office [phone]

## 2024-10-03 NOTE — Progress Notes (Signed)
 PROGRESS NOTE  [name] FMW:[id number] DOB: 1946-01-10 DOA: 10/01/2024 PCP: Medicine, [facility] (Inactive)   LOS: 0 days   Brief narrative:   [name] is a 78 y.o. 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  Patient has been seen by oncology Dr. Timmy for her CLL and is currently on Gazyva /Brukinsa .  She had a recent oncology follow-up office visit in 10/7 for worsening back pain and had recently undergone an MRI at [facility] 2 weeks ago on 9/29 which showed acute/subacute superior endplate compression of the L5 infiltrative marrow lesions.  She also had PET scan done 09/27/2024 showed increased uptake in the multiple thoracic lumbar vertebra consistent with metastasis.   Oncology had also noted a nodule under the skin of her left lower leg and concerned about possible transformation to high-grade lymphoma.  Patient has had back pain going on for 3 months but has been worse for the past 3 weeks, not helped with hydrocodone  at home.  Denies any lower extremity weakness or saddle anesthesia.  In the ED patient was noted to be hypertensive.  Labs were unremarkable except for elevated alkaline phosphatase.  Urine analysis was negative.   Patient was given oxycodone  and IV fentanyl  and was considered for admission to the hospital for further evaluation and treatment.  Assessment/Plan: Principal Problem:   Back pain Active Problems:   Hypothyroidism   Essential hypertension   DOE (dyspnea on exertion)   CLL (chronic lymphocytic leukemia) (HCC)  Acute on chronic low back pain CLL currently on treatment Recent MRI of the back with findings suggestive of metastasis.  She has a left lower leg  subcutaneous nodule on exam and oncology concerned about possible transformation to high-grade lymphoma.  Patient has undergone ultrasound-guided core needle biopsy of the nodule today.   Follow oncology recommendation.  Continue pain management with Oxy IR as needed, Tylenol  as needed, and lidocaine  patch.    Pathological fracture of T5 T10 T12-L1.  Likely contributing to back pain.  Oncology on board and has been started on dexamethasone .  Seen by physical therapy and no therapy needs were identified.   Dyspnea on exertion, cough Lungs clear on exam.  Chest x-ray without infiltrate.  Currently stable.  Hypertension Continue pain management and  amlodipine .  Continue as needed IV labetalol PRN SBP >160.   History of DVT/PE in 2019 On Xarelto  as outpatient.  Plan for CT-guided bone marrow biopsy so on heparin  subcu.   Hypothyroidism Continue levothyroxine .   Left leg syndrome Continue ropinirole .   Dementia Continue memantine.  Delirium precautions.   Asthma Stable, no signs of acute exacerbation.  Continue home inhalers.   GERD Continue famotidine .   Glaucoma Continue latanoprost  eyedrops.   Anxiety/depression/insomnia Continue duloxetine, venlafaxine , and trazodone.   Diet controlled type 2 diabetes Hemoglobin A1c was 6.9 in March 2019.  Repeat hemoglobin A1c.   DVT prophylaxis: heparin  injection 5,000 Units Start: 10/02/24 0900   Disposition: Likely home when okay with oncology.  Status is: Observation The patient will require care spanning > 2 midnights and should be moved to inpatient because: Back pain under workup, repeat MRI and CT pulmonary biopsy, pending clinical improvement    Code Status:     Code Status: Full Code  Family Communication: Spoke with patient's granddaughter  at bedside  Consultants: Oncology  Procedures: None.  Anti-infectives:  none  Anti-infectives (From admission, onward)    None      Subjective: Today, patient was seen  and examined at bedside.  Status post left leg biopsy.  Patient restarted she was able to go to the bathroom with a walker.  Denies overt pain in the back.  Objective: Vitals:   10/03/24 1000 10/03/24 1339  BP: (!) 163/67 (!) 159/80  Pulse:  (!) 105  Resp:  18  Temp:  97.9 F (36.6 C)  SpO2:  97%    Intake/Output Summary (Last 24 hours) at 10/03/2024 1445 Last data filed at 10/03/2024 1400 Gross per 24 hour  Intake 2216.61 ml  Output 300 ml  Net 1916.61 ml   There were no vitals filed for this visit. There is no height or weight on file to calculate BMI.   Physical Exam:  GENERAL: Patient is alert awake and oriented. Not in obvious distress.  Elderly female, Communicative, HENT: Mild pallor noted.  Pupils equally reactive to light. Oral mucosa is moist NECK: is supple, no gross swelling noted. CHEST: Clear to auscultation.   Diminished breath sounds bilaterally.   CVS: S1 and S2 heard, no murmur. Regular rate and rhythm.  ABDOMEN: Soft, non-tender, bowel sounds are present. EXTREMITIES: No edema.  Subcutaneous nodule in the left upper leg status postbiopsy with Band-Aid. CNS: Cranial nerves are intact.  Moves lower extremities. SKIN: warm and dry without rashes.  Data Review: I have personally reviewed the following laboratory data and studies,  CBC: Recent Labs  Lab 10/01/24 1552 10/03/24 0704  WBC 5.7 3.8*  NEUTROABS 3.2  --   HGB 13.3 12.6  HCT 41.9 40.5  MCV 94.8 96.9  PLT 215 199   Basic Metabolic Panel: Recent Labs  Lab 10/01/24 1552 10/03/24 0704  NA 139 139  K 4.2 3.8  CL 103 105  CO2 24 24  GLUCOSE 141* 113*  BUN 11 8  CREATININE 0.83 0.72  CALCIUM 10.2 9.4  MG  --  2.0  PHOS  --  3.4   Liver Function Tests: Recent Labs  Lab 10/01/24 1552 10/03/24 0704  AST 20 18  ALT 9 9  ALKPHOS 175* 156*  BILITOT 0.4 0.4  PROT 6.5 6.0*  ALBUMIN 4.1 3.8   No results for input(s): LIPASE, AMYLASE in the last 168 hours. No results for  input(s): AMMONIA in the last 168 hours. Cardiac Enzymes: No results for input(s): CKTOTAL, CKMB, CKMBINDEX, TROPONINI in the last 168 hours. BNP (last 3 results) No results for input(s): BNP in the last 8760 hours.  ProBNP (last 3 results) No results for input(s): PROBNP in the last 8760 hours.  CBG: No results for input(s): GLUCAP in the last 168 hours. No results found for this or any previous visit (from the past 240 hours).   Studies: US  CORE BIOPSY (SOFT TISSUE) Result Date: 10/03/2024 CLINICAL DATA:  Soft tissue nodule left lower extremity. EXAM: MUSCLE/SOFT TISSUE CORE BIOPSY ANESTHESIA/SEDATION: 1% lidocaine  MEDICATIONS: None PROCEDURE: None COMPLICATIONS: None immediate FINDINGS: Ultrasound imaging of the pretibial region in the left lower extremity demonstrates an irregular soft tissue nodule in the subcutaneous tissue with a hypoechoic with center and hyperechoic border with minimal vascularity. The left lower extremity was prepped and draped in usual sterile fashion. Local anesthesia was achieved by infiltrating subcutaneous tissue with 1% lidocaine . A small incision was created in the patient's skin and the 18 gauge super core biopsy needle was  advanced under ultrasound guidance until the needle tip was on the proximal edge. The biopsy needle was deployed and a sample was obtained. Samples placed in formalin. A second sample was obtained in similar fashion. Post biopsy color flow images demonstrate no active hemorrhage. Sterile dressing applied. IMPRESSION: Satisfactory core needle biopsy of a left lower extremity soft tissue nodule as described above. Electronically Signed   By: Cordella Banner   On: 10/03/2024 10:49   MR Lumbar Spine W Wo Contrast Result Date: 10/02/2024 EXAM: MRI CERVICAL, THORACIC, AND LUMBAR SPINE WITH AND WITHOUT CONTRAST 10/02/2024 12:34:56 PM TECHNIQUE: Multiplanar multisequence MRI of the cervical, thoracic, and lumbar spine was performed  without and with the administration of 6 mL gadobutrol  (GADAVIST ) 1 MMOL/ML injection. COMPARISON: None available. CLINICAL HISTORY: Hematologic malignancy, monitor; History of CLL. On treatment. Now with severe back pain. MR Thoracic, Lumbar and Cervical w/wo: 6 ml Gadavist  (MULTIPLE, MULTIPLE REMINDERS TO HOLD STILL. PT. CONTINUED TO MOVE. WAS MEDICATED); Hx of CLL. Pt is here for pain management. Pt has back lower and upper back pain. FINDINGS: BONES AND ALIGNMENT: Cervical spine: Straightening of the normal cervical lordosis. No suspicious osseous lesions. Normal vertebral body heights. Marrow signal is unremarkable. No abnormal enhancement. Thoracic spine: There is edema throughout the T5 vertebral body with irregularity of the endplates and up to 80% height loss centrally compatible with acute compression fracture. There is approximately 3 mm retropulsion at the superior endplate of T5. Additional mild chronic deformity of the T3 vertebral body with mild height loss. Additional edema in the T10 through T12 vertebral bodies with irregularity of the endplates and associated height loss. There is approximately 50% height loss of the T11 vertebral body with 2 mm retropulsion at the superior endplate. Additional 55% height loss of the T12 vertebral body centrally without significant retropulsion. There is associated enhancement in the T10 through T12 vertebral bodies. For the remaining thoracic vertebral bodies (T1-T2, T4, T6-T9), vertebral body heights are normal, marrow signal is unremarkable, and there is no abnormal enhancement. Lumbar spine: Signal abnormality in the superior aspect of the L1 vertebral body with associated edema and irregularity of the superior endplate concerning for compression fracture with minimal height loss. There is additional irregularity of the L2 superior endplate with associated edema with no more than 20% height loss. Irregularity of the L5 superior endplate with up to 20% height  loss and edema and mild enhancement which may reflect additional compression fracture. No significant retropulsion in the lumbar spine. For the remaining lumbar vertebral bodies (L3, L4), vertebral body heights are normal, marrow signal is unremarkable, and there is no abnormal enhancement. Overall: Degenerative endplate changes at multiple levels. SPINAL CORD: Cervical spine: Mild flattening of the ventral cervical cord at C5-C6 and C6-C7. Thoracic spine: Retropulsion of fracture fragments of T5 causes indentation of the thecal sac with mild flattening of the ventral cord. No definite cord signal abnormality at this level. There is mild dural enhancement along the ventral and dorsal aspects of the spinal canal from T9 to the level of T11 and T12 which may be reactive in the setting of acute fracture. There is no evidence of nodule or mass-like enhancement. Lumbar spine: The conus medullaris extends to the L1-L2 level. SOFT TISSUES: Cervical: Unremarkable. Thoracic: Unremarkable. Lumbar: Cyst in the posterior aspect of the left kidney. CERVICAL DISC LEVELS: C2-C3: Small disc bulge without significant spinal canal or foraminal stenosis. C3-C4: No significant spinal canal stenosis. Facet arthrosis slightly greater on the left. Mild left  foraminal stenosis. C4-C5: Small disc bulge. Bilateral facet arthrosis. No significant spinal canal or foraminal stenosis. C5-C6: Diffuse disc bulge which indents the ventral thecal sac and likely contacts the ventral cervical cord with mild flattening of the cord. Bilateral facet arthrosis. Uncovertebral hypertrophy on the left. Mild left foraminal stenosis. C6-C7: Diffuse disc bulge which indents the ventral thecal sac with mild flattening of the ventral cervical cord and bilateral facet arthrosis. No significant foraminal stenosis. C7-T1: No significant spinal canal or foraminal stenosis. THORACIC DISC LEVELS: T5-T6: Retropulsion of fracture fragments and facet arthrosis contributes  to moderate foraminal stenosis bilaterally. T6-T7 through T8-T9: Multiple shallow disc bulges without significant spinal canal stenosis. Facet arthrosis at multiple levels. T10-T11: Additional moderate bilateral foraminal stenosis. Other thoracic levels: T1-T2, T2-T3, T3-T4, T4-T5, T9-T10, T11-T12: No significant disc herniation. No spinal canal stenosis or neural foraminal narrowing. LUMBAR DISC LEVELS: T12-L1: Mild facet arthrosis. No significant spinal canal or foraminal stenosis. L1-L2: Small disc bulge and mild facet arthrosis. No significant spinal canal or foraminal stenosis. L2-L3: Moderate disc height loss. Diffuse disc bulge. Small right subarticular disc protrusion. Mild to moderate facet arthrosis. No significant spinal canal stenosis or foraminal stenosis. L3-L4: Mild to moderate disc height loss. Diffuse disc bulge. Moderate facet arthrosis. Mild lateral recess narrowing and mild spinal canal stenosis. Bilateral foraminal disc protrusions. Mild bilateral foraminal stenosis slightly greater on the left. L4-L5: Diffuse disc bulge. Small central annular fissure. Mild lateral recess narrowing. Moderate facet arthrosis with bilateral facet effusions. Mild spinal canal stenosis. Mild foraminal stenosis on the left. L5-S1: Moderate to severe disc height loss. Small disc bulge. Mild to moderate facet arthrosis. No significant spinal canal stenosis. Mild right and moderate left foraminal stenosis. IMPRESSION: 1. Acute compression fracture of T5 with up to 80% height loss and 3 mm retropulsion, causing indentation of the thecal sac with mild ventral cord flattening and contributing to moderate bilateral foraminal stenosis at T5-T6. 2. Acute/subacute compression fractures involving T10T12 with edema and enhancement, including approximately 50% height loss at T11 with 2 mm retropulsion and 55% height loss at T12 without significant retropulsion. 3. Additional probable subacute compression fractures at L2 and L5.  Probable minimal superior endplate compression at L1. 4. Findings concerning for pathologic fractures in the setting of known hematologic malignancy. 5. Degenerative changes as above. No high-grade spinal canal stenosis. Moderate foraminal stenosis at multiple levels. Electronically signed by: Donnice Mania MD 10/02/2024 01:51 PM EDT RP Workstation: [id number]   MR THORACIC SPINE W WO CONTRAST Result Date: 10/02/2024 EXAM: MRI CERVICAL, THORACIC, AND LUMBAR SPINE WITH AND WITHOUT CONTRAST 10/02/2024 12:34:56 PM TECHNIQUE: Multiplanar multisequence MRI of the cervical, thoracic, and lumbar spine was performed without and with the administration of 6 mL gadobutrol  (GADAVIST ) 1 MMOL/ML injection. COMPARISON: None available. CLINICAL HISTORY: Hematologic malignancy, monitor; History of CLL. On treatment. Now with severe back pain. MR Thoracic, Lumbar and Cervical w/wo: 6 ml Gadavist  (MULTIPLE, MULTIPLE REMINDERS TO HOLD STILL. PT. CONTINUED TO MOVE. WAS MEDICATED); Hx of CLL. Pt is here for pain management. Pt has back lower and upper back pain. FINDINGS: BONES AND ALIGNMENT: Cervical spine: Straightening of the normal cervical lordosis. No suspicious osseous lesions. Normal vertebral body heights. Marrow signal is unremarkable. No abnormal enhancement. Thoracic spine: There is edema throughout the T5 vertebral body with irregularity of the endplates and up to 80% height loss centrally compatible with acute compression fracture. There is approximately 3 mm retropulsion at the superior endplate of T5. Additional mild chronic deformity of the T3  vertebral body with mild height loss. Additional edema in the T10 through T12 vertebral bodies with irregularity of the endplates and associated height loss. There is approximately 50% height loss of the T11 vertebral body with 2 mm retropulsion at the superior endplate. Additional 55% height loss of the T12 vertebral body centrally without significant retropulsion. There is  associated enhancement in the T10 through T12 vertebral bodies. For the remaining thoracic vertebral bodies (T1-T2, T4, T6-T9), vertebral body heights are normal, marrow signal is unremarkable, and there is no abnormal enhancement. Lumbar spine: Signal abnormality in the superior aspect of the L1 vertebral body with associated edema and irregularity of the superior endplate concerning for compression fracture with minimal height loss. There is additional irregularity of the L2 superior endplate with associated edema with no more than 20% height loss. Irregularity of the L5 superior endplate with up to 20% height loss and edema and mild enhancement which may reflect additional compression fracture. No significant retropulsion in the lumbar spine. For the remaining lumbar vertebral bodies (L3, L4), vertebral body heights are normal, marrow signal is unremarkable, and there is no abnormal enhancement. Overall: Degenerative endplate changes at multiple levels. SPINAL CORD: Cervical spine: Mild flattening of the ventral cervical cord at C5-C6 and C6-C7. Thoracic spine: Retropulsion of fracture fragments of T5 causes indentation of the thecal sac with mild flattening of the ventral cord. No definite cord signal abnormality at this level. There is mild dural enhancement along the ventral and dorsal aspects of the spinal canal from T9 to the level of T11 and T12 which may be reactive in the setting of acute fracture. There is no evidence of nodule or mass-like enhancement. Lumbar spine: The conus medullaris extends to the L1-L2 level. SOFT TISSUES: Cervical: Unremarkable. Thoracic: Unremarkable. Lumbar: Cyst in the posterior aspect of the left kidney. CERVICAL DISC LEVELS: C2-C3: Small disc bulge without significant spinal canal or foraminal stenosis. C3-C4: No significant spinal canal stenosis. Facet arthrosis slightly greater on the left. Mild left foraminal stenosis. C4-C5: Small disc bulge. Bilateral facet arthrosis. No  significant spinal canal or foraminal stenosis. C5-C6: Diffuse disc bulge which indents the ventral thecal sac and likely contacts the ventral cervical cord with mild flattening of the cord. Bilateral facet arthrosis. Uncovertebral hypertrophy on the left. Mild left foraminal stenosis. C6-C7: Diffuse disc bulge which indents the ventral thecal sac with mild flattening of the ventral cervical cord and bilateral facet arthrosis. No significant foraminal stenosis. C7-T1: No significant spinal canal or foraminal stenosis. THORACIC DISC LEVELS: T5-T6: Retropulsion of fracture fragments and facet arthrosis contributes to moderate foraminal stenosis bilaterally. T6-T7 through T8-T9: Multiple shallow disc bulges without significant spinal canal stenosis. Facet arthrosis at multiple levels. T10-T11: Additional moderate bilateral foraminal stenosis. Other thoracic levels: T1-T2, T2-T3, T3-T4, T4-T5, T9-T10, T11-T12: No significant disc herniation. No spinal canal stenosis or neural foraminal narrowing. LUMBAR DISC LEVELS: T12-L1: Mild facet arthrosis. No significant spinal canal or foraminal stenosis. L1-L2: Small disc bulge and mild facet arthrosis. No significant spinal canal or foraminal stenosis. L2-L3: Moderate disc height loss. Diffuse disc bulge. Small right subarticular disc protrusion. Mild to moderate facet arthrosis. No significant spinal canal stenosis or foraminal stenosis. L3-L4: Mild to moderate disc height loss. Diffuse disc bulge. Moderate facet arthrosis. Mild lateral recess narrowing and mild spinal canal stenosis. Bilateral foraminal disc protrusions. Mild bilateral foraminal stenosis slightly greater on the left. L4-L5: Diffuse disc bulge. Small central annular fissure. Mild lateral recess narrowing. Moderate facet arthrosis with bilateral facet effusions. Mild  spinal canal stenosis. Mild foraminal stenosis on the left. L5-S1: Moderate to severe disc height loss. Small disc bulge. Mild to moderate facet  arthrosis. No significant spinal canal stenosis. Mild right and moderate left foraminal stenosis. IMPRESSION: 1. Acute compression fracture of T5 with up to 80% height loss and 3 mm retropulsion, causing indentation of the thecal sac with mild ventral cord flattening and contributing to moderate bilateral foraminal stenosis at T5-T6. 2. Acute/subacute compression fractures involving T10T12 with edema and enhancement, including approximately 50% height loss at T11 with 2 mm retropulsion and 55% height loss at T12 without significant retropulsion. 3. Additional probable subacute compression fractures at L2 and L5. Probable minimal superior endplate compression at L1. 4. Findings concerning for pathologic fractures in the setting of known hematologic malignancy. 5. Degenerative changes as above. No high-grade spinal canal stenosis. Moderate foraminal stenosis at multiple levels. Electronically signed by: Donnice Mania MD 10/02/2024 01:51 PM EDT RP Workstation: [id number]   MR CERVICAL SPINE W WO CONTRAST Result Date: 10/02/2024 EXAM: MRI CERVICAL, THORACIC, AND LUMBAR SPINE WITH AND WITHOUT CONTRAST 10/02/2024 12:34:56 PM TECHNIQUE: Multiplanar multisequence MRI of the cervical, thoracic, and lumbar spine was performed without and with the administration of 6 mL gadobutrol  (GADAVIST ) 1 MMOL/ML injection. COMPARISON: None available. CLINICAL HISTORY: Hematologic malignancy, monitor; History of CLL. On treatment. Now with severe back pain. MR Thoracic, Lumbar and Cervical w/wo: 6 ml Gadavist  (MULTIPLE, MULTIPLE REMINDERS TO HOLD STILL. PT. CONTINUED TO MOVE. WAS MEDICATED); Hx of CLL. Pt is here for pain management. Pt has back lower and upper back pain. FINDINGS: BONES AND ALIGNMENT: Cervical spine: Straightening of the normal cervical lordosis. No suspicious osseous lesions. Normal vertebral body heights. Marrow signal is unremarkable. No abnormal enhancement. Thoracic spine: There is edema throughout the T5  vertebral body with irregularity of the endplates and up to 80% height loss centrally compatible with acute compression fracture. There is approximately 3 mm retropulsion at the superior endplate of T5. Additional mild chronic deformity of the T3 vertebral body with mild height loss. Additional edema in the T10 through T12 vertebral bodies with irregularity of the endplates and associated height loss. There is approximately 50% height loss of the T11 vertebral body with 2 mm retropulsion at the superior endplate. Additional 55% height loss of the T12 vertebral body centrally without significant retropulsion. There is associated enhancement in the T10 through T12 vertebral bodies. For the remaining thoracic vertebral bodies (T1-T2, T4, T6-T9), vertebral body heights are normal, marrow signal is unremarkable, and there is no abnormal enhancement. Lumbar spine: Signal abnormality in the superior aspect of the L1 vertebral body with associated edema and irregularity of the superior endplate concerning for compression fracture with minimal height loss. There is additional irregularity of the L2 superior endplate with associated edema with no more than 20% height loss. Irregularity of the L5 superior endplate with up to 20% height loss and edema and mild enhancement which may reflect additional compression fracture. No significant retropulsion in the lumbar spine. For the remaining lumbar vertebral bodies (L3, L4), vertebral body heights are normal, marrow signal is unremarkable, and there is no abnormal enhancement. Overall: Degenerative endplate changes at multiple levels. SPINAL CORD: Cervical spine: Mild flattening of the ventral cervical cord at C5-C6 and C6-C7. Thoracic spine: Retropulsion of fracture fragments of T5 causes indentation of the thecal sac with mild flattening of the ventral cord. No definite cord signal abnormality at this level. There is mild dural enhancement along the ventral and dorsal aspects of  the spinal canal from T9 to the level of T11 and T12 which may be reactive in the setting of acute fracture. There is no evidence of nodule or mass-like enhancement. Lumbar spine: The conus medullaris extends to the L1-L2 level. SOFT TISSUES: Cervical: Unremarkable. Thoracic: Unremarkable. Lumbar: Cyst in the posterior aspect of the left kidney. CERVICAL DISC LEVELS: C2-C3: Small disc bulge without significant spinal canal or foraminal stenosis. C3-C4: No significant spinal canal stenosis. Facet arthrosis slightly greater on the left. Mild left foraminal stenosis. C4-C5: Small disc bulge. Bilateral facet arthrosis. No significant spinal canal or foraminal stenosis. C5-C6: Diffuse disc bulge which indents the ventral thecal sac and likely contacts the ventral cervical cord with mild flattening of the cord. Bilateral facet arthrosis. Uncovertebral hypertrophy on the left. Mild left foraminal stenosis. C6-C7: Diffuse disc bulge which indents the ventral thecal sac with mild flattening of the ventral cervical cord and bilateral facet arthrosis. No significant foraminal stenosis. C7-T1: No significant spinal canal or foraminal stenosis. THORACIC DISC LEVELS: T5-T6: Retropulsion of fracture fragments and facet arthrosis contributes to moderate foraminal stenosis bilaterally. T6-T7 through T8-T9: Multiple shallow disc bulges without significant spinal canal stenosis. Facet arthrosis at multiple levels. T10-T11: Additional moderate bilateral foraminal stenosis. Other thoracic levels: T1-T2, T2-T3, T3-T4, T4-T5, T9-T10, T11-T12: No significant disc herniation. No spinal canal stenosis or neural foraminal narrowing. LUMBAR DISC LEVELS: T12-L1: Mild facet arthrosis. No significant spinal canal or foraminal stenosis. L1-L2: Small disc bulge and mild facet arthrosis. No significant spinal canal or foraminal stenosis. L2-L3: Moderate disc height loss. Diffuse disc bulge. Small right subarticular disc protrusion. Mild to moderate  facet arthrosis. No significant spinal canal stenosis or foraminal stenosis. L3-L4: Mild to moderate disc height loss. Diffuse disc bulge. Moderate facet arthrosis. Mild lateral recess narrowing and mild spinal canal stenosis. Bilateral foraminal disc protrusions. Mild bilateral foraminal stenosis slightly greater on the left. L4-L5: Diffuse disc bulge. Small central annular fissure. Mild lateral recess narrowing. Moderate facet arthrosis with bilateral facet effusions. Mild spinal canal stenosis. Mild foraminal stenosis on the left. L5-S1: Moderate to severe disc height loss. Small disc bulge. Mild to moderate facet arthrosis. No significant spinal canal stenosis. Mild right and moderate left foraminal stenosis. IMPRESSION: 1. Acute compression fracture of T5 with up to 80% height loss and 3 mm retropulsion, causing indentation of the thecal sac with mild ventral cord flattening and contributing to moderate bilateral foraminal stenosis at T5-T6. 2. Acute/subacute compression fractures involving T10T12 with edema and enhancement, including approximately 50% height loss at T11 with 2 mm retropulsion and 55% height loss at T12 without significant retropulsion. 3. Additional probable subacute compression fractures at L2 and L5. Probable minimal superior endplate compression at L1. 4. Findings concerning for pathologic fractures in the setting of known hematologic malignancy. 5. Degenerative changes as above. No high-grade spinal canal stenosis. Moderate foraminal stenosis at multiple levels. Electronically signed by: Donnice Mania MD 10/02/2024 01:50 PM EDT RP Workstation: [id number]   CT TIBIA FIBULA LEFT W CONTRAST Result Date: 10/02/2024 EXAM: CT Left Lower Extremity, With IV Contrast, 10/02/2024 12:58:34 PM TECHNIQUE: Axial images were acquired through the left lower extremity with IV contrast. Reformatted images were reviewed. Automated exposure control, iterative reconstruction, and/or weight based adjustment  of the mA/kV was utilized to reduce the radiation dose to as low as reasonably achievable. COMPARISON: None available. CLINICAL HISTORY: Subcutaneous nodules of the calf. History of chronic lymphocytic leukemia. FINDINGS: BONES AND JOINTS: Severe osteoarthritis of the knee is suspected with a small free  osteochondral fragment anteriorly in the knee joint just anterior to the tibial spine. 2 chronic lag screws are present in the medial malleolus. No acute bony findings. SOFT TISSUES: The soft tissues are remarkable for a 1.5 x 1.5 x 1.8 cm soft tissue nodule in the subcutaneous tissues of the proximal calf, present on image 44, series 9. This nodule has a more solid central component measuring 0.6 x 1.5 cm and a peripheral fatty infiltrate component. Possibilities may include inflammatory lesions or subcutaneous malignancy. Consider biopsy. Additionally, there are smaller clustered nodules on images 49 through 63 of series 6 in the medial proximal calf subcutaneous tissues, with the index nodule measuring 0.6 cm in diameter on image 54, series 6. A small knee joint effusion is also present. IMPRESSION: 1. Severe osteoarthritis of the left knee with suspected small free osteochondral fragment anterior to the tibial spine. 2. Soft tissue nodule in the subcutaneous tissues of the proximal left calf (1.5 x 1.5 x 1.8 cm) with solid central component and peripheral fatty component; inflammatory lesion or subcutaneous malignancy considered. Consider biopsy. 3. Smaller clustered subcutaneous nodules in the medial proximal left calf, index nodule 0.6 cm. 4. Small left knee joint effusion. 5. Chronic lag screws in the medial malleolus. Electronically signed by: Ryan Salvage MD 10/02/2024 01:29 PM EDT RP Workstation: [id number]   DG CHEST PORT 1 VIEW Result Date: 10/01/2024 CLINICAL DATA:  Shortness of breath, cough EXAM: PORTABLE CHEST 1 VIEW COMPARISON:  07/11/2020 FINDINGS: Linear left basilar atelectasis. Right  lung clear. No effusions. Heart and mediastinal contours are within normal limits. No acute bony abnormality. IMPRESSION: Left base atelectasis. No active disease. Electronically Signed   By: Franky Crease M.D.   On: 10/01/2024 21:56    Vernal Alstrom, MD  [facility] 10/03/2024  If 7PM-7AM, please contact night-coverage

## 2024-10-03 NOTE — Progress Notes (Signed)
 PT is out of room for procedure- scheduled respiratory medications not given at this time. RT will attempt to deliver at a later time today.

## 2024-10-03 NOTE — Care Management Obs Status (Signed)
 MEDICARE OBSERVATION STATUS NOTIFICATION   Patient Details  Name: [name] MRN: [id number] Date of Birth: June 04, 1946   Medicare Observation Status Notification Given:  [name], LCSW 10/03/2024, 10:46 AM

## 2024-10-03 NOTE — Procedures (Signed)
 Interventional Radiology Procedure Note  Procedure: US  Guided Biopsy of left leg soft tissue nodule  Complications: None  Estimated Blood Loss: < 10 mL  Findings: 18 G core biopsy of soft tissue nodule performed under US  guidance.  2 core samples obtained and sent to Pathology.  Cordella DELENA Banner, MD

## 2024-10-03 NOTE — Consult Note (Signed)
 Radiation Oncology         [phone] ________________________________  Initial inpatient Consultation  Name: [name] MRN: [id number]  Date of Service: 10/01/2024 DOB: 1946-10-03  RR:[id number], [facility] (Inactive)  No ref. 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    ICD-10-CM   1. Acute low back pain, unspecified back pain laterality, unspecified whether sciatica present  M54.50 0.9 %  sodium chloride  infusion    2. Iron deficiency anemia due to chronic blood loss  D50.0 0.9 %  sodium chloride  infusion    3. CLL (chronic lymphocytic leukemia) (HCC)  C91.10 0.9 %  sodium chloride  infusion    dexamethasone  (DECADRON ) injection 20 mg      HISTORY OF PRESENT ILLNESS: [name] is a 78 y.o. female seen at the request of Dr. Timmy.  She was initially diagnosed with CLL in May 2025 and currently on systemic treatment with Gazyva  and Brukinsa  with good response.  She developed some progressive back pain in August 2025 that was being worked up by her PCP.  She had x-rays in early September 2025 that showed compression fractures at T3, T5 and L2 managed with a back brace.  She did have an MRI of the lumbar spine on 09/16/2024 which showed degenerative disease but also some concerning infiltrative marrow lesions T11-L1, felt secondary to her CLL. The pain became severe and she was admitted to the hospital on 10/01/2024 for further evaluation.  MRI of the cervical, thoracic and lumbar spine were performed on 10/02/2024 showing: IMPRESSION: 1. Acute compression fracture of T5 with up to 80% height loss and 3 mm retropulsion, causing indentation of the thecal sac with  mild ventral cord flattening and contributing to moderate bilateral foraminal stenosis at T5-T6. 2. Acute/subacute compression fractures involving T10T12 with edema and enhancement, including approximately 50% height loss at T11 with 2 mm retropulsion and 55% height loss at T12 without significant retropulsion. 3. Additional probable subacute compression fractures at L2 and L5. Probable minimal superior endplate compression at L1. 4. Findings concerning for pathologic fractures in the setting of known hematologic malignancy. 5. Degenerative changes as above. No high-grade spinal canal stenosis. Moderate foraminal stenosis at multiple levels.   She has also developed a new subcutaneous nodule in the left proximal calf and is scheduled for biopsy of this lesion today.  Her back pain has been managed with oxycodone  and IV fentanyl  with significant improvement.  She has remained neurologically intact and denies any changes with her bowel or bladder function.  We have been asked to consult the patient today for consideration of palliative radiation to the painful disease in the thoracic spine although pathology is pending at this time.  Fortunately, there is no evidence of impending cord compression on recent imaging.  PREVIOUS RADIATION THERAPY: No  PAST MEDICAL HISTORY:  Past Medical History:  Diagnosis Date   Allergic rhinitis    Allergic rhinitis    Anxiety    Arthritis    hands, feet, shoulders, arms (02/22/2018)   Bilateral pulmonary embolism (HCC) 02/21/2018   Chronic cough    CLL (chronic lymphocytic leukemia) (HCC) 04/24/2024   Depression    Disease of thyroid  gland    Dupuytren contracture  both feet, both hands (02/22/2018)   DVT (deep venous thrombosis) (HCC)    bil le   GERD (gastroesophageal reflux disease)    Glaucoma    Hypertension    Hypothyroidism    Iron deficiency anemia due to chronic blood loss 05/23/2024   Mild persistent asthma    cougher not a wheezr  gets chokes easily   Pneumonia    PONV (postoperative nausea and vomiting)       PAST SURGICAL HISTORY: Past Surgical History:  Procedure Laterality Date   BLADDER SUSPENSION  X 2   BUNIONECTOMY     RIGHT FOOT ARCH REPAIRED AS WELL   CATARACT EXTRACTION W/ INTRAOCULAR LENS  IMPLANT, BILATERAL Bilateral    DILATION AND CURETTAGE OF UTERUS  X 3   DUPUYTREN CONTRACTURE RELEASE Bilateral    feet   FASCIECTOMY Right 01/23/2018   Procedure: SEGMENTAL FASCIECTOMY RIGHT MIDDLE FINGER;  Surgeon: Murrell Kuba, MD;  Location: [facility];  Service: Orthopedics;  Laterality: Right;  AXILLARY BLOCK   fracture surgery right tibia and ankle     KNEE ARTHROSCOPY Right    REDUCTION MAMMAPLASTY Bilateral 2018   TOTAL ABDOMINAL HYSTERECTOMY  1975   got pregnant w/IUD in then lost baby   TOTAL KNEE ARTHROPLASTY Right 06/08/2020   Procedure: TOTAL KNEE ARTHROPLASTY;  Surgeon: Rubie Kemps, MD;  Location: [facility];  Service: Orthopedics;  Laterality: Right;    FAMILY HISTORY:  Family History  Problem Relation Age of Onset   Hypertension Mother    Arthritis Mother    Breast cancer Mother    Colon cancer Mother    Colon polyps Mother    Diabetes Father    Pneumonia Father    Heart attack Father    Heart disease Father    Dementia Father    Diabetes Brother    Breast cancer Maternal Aunt    Sleep apnea Neg Hx     SOCIAL HISTORY:  Social History   Socioeconomic History   Marital status: Widowed    Spouse name: Not on file   Number of children: 2   Years of education: 25   Highest education level: Not on file  Occupational History   Occupation: Retired  Tobacco Use   Smoking status: Never   Smokeless tobacco: Never  Vaping Use   Vaping status: Never Used  Substance and Sexual Activity   Alcohol use: Not Currently    Comment: 02/22/2018 might have a drink q couple years   Drug use: No   Sexual activity: Not Currently  Other Topics Concern   Not on file  Social  History Narrative   ** Merged History Encounter **       Lives at home with her brother. Right-handed. 3-4 cups coffee per day.   Social Drivers of Corporate investment banker Strain: Low Risk  (08/02/2024)   Received [facility]   Overall Financial Resource Strain (CARDIA)    How hard is it for you to pay for the very basics like food, housing, medical care, and heating?: Not hard at all  Food Insecurity: No Food Insecurity (10/02/2024)   Hunger Vital Sign    Worried About Running Out of Food in the Last Year: Never true    Ran Out of Food in the Last Year: Never true  Transportation Needs: No Transportation Needs (10/02/2024)   PRAPARE - Administrator, Civil Service (Medical): No    Lack of Transportation (Non-Medical): No  Physical  Activity: Inactive (08/02/2024)   Received from [facility]   Exercise Vital Sign    On average, how many days per week do you engage in moderate to strenuous exercise (like a brisk walk)?: 0 days    Minutes of Exercise per Session: Not on file  Stress: Stress Concern Present (08/02/2024)   Received from [facility] - Occupational Stress Questionnaire    Do you feel stress - tense, restless, nervous, or anxious, or unable to sleep at night because your mind is troubled all the time - these days?: To some extent  Social Connections: Socially Isolated (10/02/2024)   Social Connection and Isolation Panel    Frequency of Communication with Friends and Family: Three times a week    Frequency of Social Gatherings with Friends and Family: Never    Attends Religious Services: Never    Database administrator or Organizations: No    Attends Banker Meetings: Never    Marital Status: Widowed  Intimate Partner Violence: Not At Risk (10/02/2024)   Humiliation, Afraid, Rape, and Kick questionnaire    Fear of Current or Ex-Partner: No    Emotionally Abused: No    Physically Abused: No     Sexually Abused: No    ALLERGIES: Naproxen and Tizanidine   MEDICATIONS:  Current Facility-Administered Medications  Medication Dose Route Frequency Provider Last Rate Last Admin   0.9 %  sodium chloride  infusion   Intravenous Continuous Timmy Maude SAUNDERS, MD 75 mL/hr at 10/03/24 0904 Rate Change at 10/03/24 0904   acetaminophen  (TYLENOL ) tablet 650 mg  650 mg Oral Q6H PRN Alfornia Madison, MD       Or   acetaminophen  (TYLENOL ) suppository 650 mg  650 mg Rectal Q6H PRN Alfornia Madison, MD       albuterol  (PROVENTIL ) (2.5 MG/3ML) 0.083% nebulizer solution 2.5 mg  2.5 mg Nebulization Q6H PRN Alfornia Madison, MD       amLODipine  (NORVASC ) tablet 10 mg  10 mg Oral Daily Rathore, Vasundhra, MD   10 mg at 10/03/24 9094   dexamethasone  (DECADRON ) injection 20 mg  20 mg Intravenous Q24H Ennever, Peter R, MD   20 mg at 10/03/24 0905   DULoxetine (CYMBALTA) DR capsule 30 mg  30 mg Oral BID Rathore, Vasundhra, MD   30 mg at 10/03/24 9094   famotidine  (PEPCID ) tablet 20 mg  20 mg Oral QHS Rathore, Vasundhra, MD   20 mg at 10/02/24 2109   feeding supplement (ENSURE PLUS HIGH PROTEIN) liquid 237 mL  237 mL Oral BID BM Alfornia Madison, MD   237 mL at 10/03/24 1000   fluticasone  (FLONASE ) 50 MCG/ACT nasal spray 2 spray  2 spray Each Nare Daily Alfornia Madison, MD   2 spray at 10/03/24 0905   fluticasone  furoate-vilanterol (BREO ELLIPTA ) 200-25 MCG/ACT 1 puff  1 puff Inhalation Daily Rathore, Vasundhra, MD   1 puff at 10/02/24 0825   guaiFENesin -dextromethorphan  (ROBITUSSIN DM) 100-10 MG/5ML syrup 5 mL  5 mL Oral Q4H PRN Alfornia Madison, MD       heparin  injection 5,000 Units  5,000 Units Subcutaneous Q8H Ennever, Peter R, MD   5,000 Units at 10/03/24 0532   hydrALAZINE (APRESOLINE) injection 10 mg  10 mg Intravenous Q6H PRN Pokhrel, Laxman, MD       labetalol (NORMODYNE) injection 5 mg  5 mg Intravenous Q2H PRN Alfornia Madison, MD   5 mg at 10/03/24 0439   latanoprost  (XALATAN ) 0.005 %  ophthalmic  solution 1 drop  1 drop Both Eyes QHS Rathore, Vasundhra, MD   1 drop at 10/02/24 2109   levothyroxine  (SYNTHROID ) tablet 50 mcg  50 mcg Oral Daily Rathore, Vasundhra, MD   50 mcg at 10/03/24 0533   lidocaine  (LIDODERM ) 5 % 1 patch  1 patch Transdermal Q24H Rathore, Vasundhra, MD   1 patch at 10/02/24 2109   loratadine  (CLARITIN ) tablet 10 mg  10 mg Oral Daily Rathore, Vasundhra, MD   10 mg at 10/03/24 0904   memantine (NAMENDA) tablet 10 mg  10 mg Oral BID Rathore, Vasundhra, MD   10 mg at 10/03/24 0905   morphine (PF) 2 MG/ML injection 1 mg  1 mg Intravenous Q4H PRN Rathore, Vasundhra, MD       naloxone (NARCAN) injection 0.4 mg  0.4 mg Intravenous PRN Rathore, Vasundhra, MD       oxyCODONE  (Oxy IR/ROXICODONE ) immediate release tablet 5 mg  5 mg Oral Q6H PRN Rathore, Vasundhra, MD   5 mg at 10/02/24 1555   QUEtiapine (SEROQUEL XR) 24 hr tablet 50 mg  50 mg Oral QHS Pokhrel, Laxman, MD   50 mg at 10/02/24 2109   rOPINIRole  (REQUIP ) tablet 2 mg  2 mg Oral QHS Rathore, Vasundhra, MD   2 mg at 10/02/24 2109   venlafaxine  XR (EFFEXOR -XR) 24 hr capsule 150 mg  150 mg Oral q morning Rathore, Vasundhra, MD   150 mg at 10/03/24 9095    REVIEW OF SYSTEMS:  On review of systems, the patient reports that she is doing well overall. Her back pain has significantly improved since admission. She denies any chest pain, shortness of breath, cough, fevers, chills, night sweats, or recent unintended weight changes. She  denies any bowel or bladder disturbances, and denies abdominal pain, nausea or vomiting. She denies any new musculoskeletal or joint aches or pains and has not noted any focal weakness or paraesthesias. A complete review of systems is obtained and is otherwise negative.    PHYSICAL EXAM:  Wt Readings from Last 3 Encounters:  09/24/24 138 lb (62.6 kg)  08/28/24 142 lb (64.4 kg)  07/23/24 140 lb (63.5 kg)   Temp Readings from Last 3 Encounters:  10/03/24 97.9 F (36.6 C) (Oral)   09/24/24 98.6 F (37 C) (Oral)  09/24/24 97.7 F (36.5 C) (Oral)   BP Readings from Last 3 Encounters:  10/03/24 (!) 159/80  09/24/24 (!) 157/76  09/24/24 129/61   Pulse Readings from Last 3 Encounters:  10/03/24 (!) 105  09/24/24 100  09/24/24 92   Pain Assessment Pain Score: 0-No pain/10  Physical Exam:  In general this is a well appearing Caucasian woman in no acute distress. She's alert and oriented x4 and appropriate throughout the examination. Cardiopulmonary assessment is negative for acute distress and she exhibits normal effort.    KPS = 50  100 - Normal; no complaints; no evidence of disease. 90   - Able to carry on normal activity; minor signs or symptoms of disease. 80   - Normal activity with effort; some signs or symptoms of disease. 35   - Cares for self; unable to carry on normal activity or to do active work. 60   - Requires occasional assistance, but is able to care for most of his personal needs. 50   - Requires considerable assistance and frequent medical care. 40   - Disabled; requires special care and assistance. 30   - Severely disabled; hospital admission is indicated although death not imminent.  20   - Very sick; hospital admission necessary; active supportive treatment necessary. 10   - Moribund; fatal processes progressing rapidly. 0     - Dead  Karnofsky DA, Abelmann WH, Craver LS and Burchenal JH [id number]) The use of the nitrogen mustards in the palliative treatment of carcinoma: with particular reference to bronchogenic carcinoma Cancer 1 634-56  LABORATORY DATA:  Lab Results  Component Value Date   WBC 3.8 (L) 10/03/2024   HGB 12.6 10/03/2024   HCT 40.5 10/03/2024   MCV 96.9 10/03/2024   PLT 199 10/03/2024   Lab Results  Component Value Date   NA 139 10/03/2024   K 3.8 10/03/2024   CL 105 10/03/2024   CO2 24 10/03/2024   Lab Results  Component Value Date   ALT 9 10/03/2024   AST 18 10/03/2024   ALKPHOS 156 (H) 10/03/2024    BILITOT 0.4 10/03/2024     RADIOGRAPHY: US  CORE BIOPSY (SOFT TISSUE) Result Date: 10/03/2024 CLINICAL DATA:  Soft tissue nodule left lower extremity. EXAM: MUSCLE/SOFT TISSUE CORE BIOPSY ANESTHESIA/SEDATION: 1% lidocaine  MEDICATIONS: None PROCEDURE: None COMPLICATIONS: None immediate FINDINGS: Ultrasound imaging of the pretibial region in the left lower extremity demonstrates an irregular soft tissue nodule in the subcutaneous tissue with a hypoechoic with center and hyperechoic border with minimal vascularity. The left lower extremity was prepped and draped in usual sterile fashion. Local anesthesia was achieved by infiltrating subcutaneous tissue with 1% lidocaine . A small incision was created in the patient's skin and the 18 gauge super core biopsy needle was advanced under ultrasound guidance until the needle tip was on the proximal edge. The biopsy needle was deployed and a sample was obtained. Samples placed in formalin. A second sample was obtained in similar fashion. Post biopsy color flow images demonstrate no active hemorrhage. Sterile dressing applied. IMPRESSION: Satisfactory core needle biopsy of a left lower extremity soft tissue nodule as described above. Electronically Signed   By: Cordella Banner   On: 10/03/2024 10:49   MR Lumbar Spine W Wo Contrast Result Date: 10/02/2024 EXAM: MRI CERVICAL, THORACIC, AND LUMBAR SPINE WITH AND WITHOUT CONTRAST 10/02/2024 12:34:56 PM TECHNIQUE: Multiplanar multisequence MRI of the cervical, thoracic, and lumbar spine was performed without and with the administration of 6 mL gadobutrol  (GADAVIST ) 1 MMOL/ML injection. COMPARISON: None available. CLINICAL HISTORY: Hematologic malignancy, monitor; History of CLL. On treatment. Now with severe back pain. MR Thoracic, Lumbar and Cervical w/wo: 6 ml Gadavist  (MULTIPLE, MULTIPLE REMINDERS TO HOLD STILL. PT. CONTINUED TO MOVE. WAS MEDICATED); Hx of CLL. Pt is here for pain management. Pt has back lower and upper  back pain. FINDINGS: BONES AND ALIGNMENT: Cervical spine: Straightening of the normal cervical lordosis. No suspicious osseous lesions. Normal vertebral body heights. Marrow signal is unremarkable. No abnormal enhancement. Thoracic spine: There is edema throughout the T5 vertebral body with irregularity of the endplates and up to 80% height loss centrally compatible with acute compression fracture. There is approximately 3 mm retropulsion at the superior endplate of T5. Additional mild chronic deformity of the T3 vertebral body with mild height loss. Additional edema in the T10 through T12 vertebral bodies with irregularity of the endplates and associated height loss. There is approximately 50% height loss of the T11 vertebral body with 2 mm retropulsion at the superior endplate. Additional 55% height loss of the T12 vertebral body centrally without significant retropulsion. There is associated enhancement in the T10 through T12 vertebral bodies. For the remaining thoracic vertebral bodies (T1-T2, T4, T6-T9), vertebral  body heights are normal, marrow signal is unremarkable, and there is no abnormal enhancement. Lumbar spine: Signal abnormality in the superior aspect of the L1 vertebral body with associated edema and irregularity of the superior endplate concerning for compression fracture with minimal height loss. There is additional irregularity of the L2 superior endplate with associated edema with no more than 20% height loss. Irregularity of the L5 superior endplate with up to 20% height loss and edema and mild enhancement which may reflect additional compression fracture. No significant retropulsion in the lumbar spine. For the remaining lumbar vertebral bodies (L3, L4), vertebral body heights are normal, marrow signal is unremarkable, and there is no abnormal enhancement. Overall: Degenerative endplate changes at multiple levels. SPINAL CORD: Cervical spine: Mild flattening of the ventral cervical cord at  C5-C6 and C6-C7. Thoracic spine: Retropulsion of fracture fragments of T5 causes indentation of the thecal sac with mild flattening of the ventral cord. No definite cord signal abnormality at this level. There is mild dural enhancement along the ventral and dorsal aspects of the spinal canal from T9 to the level of T11 and T12 which may be reactive in the setting of acute fracture. There is no evidence of nodule or mass-like enhancement. Lumbar spine: The conus medullaris extends to the L1-L2 level. SOFT TISSUES: Cervical: Unremarkable. Thoracic: Unremarkable. Lumbar: Cyst in the posterior aspect of the left kidney. CERVICAL DISC LEVELS: C2-C3: Small disc bulge without significant spinal canal or foraminal stenosis. C3-C4: No significant spinal canal stenosis. Facet arthrosis slightly greater on the left. Mild left foraminal stenosis. C4-C5: Small disc bulge. Bilateral facet arthrosis. No significant spinal canal or foraminal stenosis. C5-C6: Diffuse disc bulge which indents the ventral thecal sac and likely contacts the ventral cervical cord with mild flattening of the cord. Bilateral facet arthrosis. Uncovertebral hypertrophy on the left. Mild left foraminal stenosis. C6-C7: Diffuse disc bulge which indents the ventral thecal sac with mild flattening of the ventral cervical cord and bilateral facet arthrosis. No significant foraminal stenosis. C7-T1: No significant spinal canal or foraminal stenosis. THORACIC DISC LEVELS: T5-T6: Retropulsion of fracture fragments and facet arthrosis contributes to moderate foraminal stenosis bilaterally. T6-T7 through T8-T9: Multiple shallow disc bulges without significant spinal canal stenosis. Facet arthrosis at multiple levels. T10-T11: Additional moderate bilateral foraminal stenosis. Other thoracic levels: T1-T2, T2-T3, T3-T4, T4-T5, T9-T10, T11-T12: No significant disc herniation. No spinal canal stenosis or neural foraminal narrowing. LUMBAR DISC LEVELS: T12-L1: Mild facet  arthrosis. No significant spinal canal or foraminal stenosis. L1-L2: Small disc bulge and mild facet arthrosis. No significant spinal canal or foraminal stenosis. L2-L3: Moderate disc height loss. Diffuse disc bulge. Small right subarticular disc protrusion. Mild to moderate facet arthrosis. No significant spinal canal stenosis or foraminal stenosis. L3-L4: Mild to moderate disc height loss. Diffuse disc bulge. Moderate facet arthrosis. Mild lateral recess narrowing and mild spinal canal stenosis. Bilateral foraminal disc protrusions. Mild bilateral foraminal stenosis slightly greater on the left. L4-L5: Diffuse disc bulge. Small central annular fissure. Mild lateral recess narrowing. Moderate facet arthrosis with bilateral facet effusions. Mild spinal canal stenosis. Mild foraminal stenosis on the left. L5-S1: Moderate to severe disc height loss. Small disc bulge. Mild to moderate facet arthrosis. No significant spinal canal stenosis. Mild right and moderate left foraminal stenosis. IMPRESSION: 1. Acute compression fracture of T5 with up to 80% height loss and 3 mm retropulsion, causing indentation of the thecal sac with mild ventral cord flattening and contributing to moderate bilateral foraminal stenosis at T5-T6. 2. Acute/subacute compression fractures  involving [id number] with edema and enhancement, including approximately 50% height loss at T11 with 2 mm retropulsion and 55% height loss at T12 without significant retropulsion. 3. Additional probable subacute compression fractures at L2 and L5. Probable minimal superior endplate compression at L1. 4. Findings concerning for pathologic fractures in the setting of known hematologic malignancy. 5. Degenerative changes as above. No high-grade spinal canal stenosis. Moderate foraminal stenosis at multiple levels. Electronically signed by: Donnice Mania MD 10/02/2024 01:51 PM EDT RP Workstation: [id number]   MR THORACIC SPINE W WO CONTRAST Result Date:  10/02/2024 EXAM: MRI CERVICAL, THORACIC, AND LUMBAR SPINE WITH AND WITHOUT CONTRAST 10/02/2024 12:34:56 PM TECHNIQUE: Multiplanar multisequence MRI of the cervical, thoracic, and lumbar spine was performed without and with the administration of 6 mL gadobutrol  (GADAVIST ) 1 MMOL/ML injection. COMPARISON: None available. CLINICAL HISTORY: Hematologic malignancy, monitor; History of CLL. On treatment. Now with severe back pain. MR Thoracic, Lumbar and Cervical w/wo: 6 ml Gadavist  (MULTIPLE, MULTIPLE REMINDERS TO HOLD STILL. PT. CONTINUED TO MOVE. WAS MEDICATED); Hx of CLL. Pt is here for pain management. Pt has back lower and upper back pain. FINDINGS: BONES AND ALIGNMENT: Cervical spine: Straightening of the normal cervical lordosis. No suspicious osseous lesions. Normal vertebral body heights. Marrow signal is unremarkable. No abnormal enhancement. Thoracic spine: There is edema throughout the T5 vertebral body with irregularity of the endplates and up to 80% height loss centrally compatible with acute compression fracture. There is approximately 3 mm retropulsion at the superior endplate of T5. Additional mild chronic deformity of the T3 vertebral body with mild height loss. Additional edema in the T10 through T12 vertebral bodies with irregularity of the endplates and associated height loss. There is approximately 50% height loss of the T11 vertebral body with 2 mm retropulsion at the superior endplate. Additional 55% height loss of the T12 vertebral body centrally without significant retropulsion. There is associated enhancement in the T10 through T12 vertebral bodies. For the remaining thoracic vertebral bodies (T1-T2, T4, T6-T9), vertebral body heights are normal, marrow signal is unremarkable, and there is no abnormal enhancement. Lumbar spine: Signal abnormality in the superior aspect of the L1 vertebral body with associated edema and irregularity of the superior endplate concerning for compression fracture  with minimal height loss. There is additional irregularity of the L2 superior endplate with associated edema with no more than 20% height loss. Irregularity of the L5 superior endplate with up to 20% height loss and edema and mild enhancement which may reflect additional compression fracture. No significant retropulsion in the lumbar spine. For the remaining lumbar vertebral bodies (L3, L4), vertebral body heights are normal, marrow signal is unremarkable, and there is no abnormal enhancement. Overall: Degenerative endplate changes at multiple levels. SPINAL CORD: Cervical spine: Mild flattening of the ventral cervical cord at C5-C6 and C6-C7. Thoracic spine: Retropulsion of fracture fragments of T5 causes indentation of the thecal sac with mild flattening of the ventral cord. No definite cord signal abnormality at this level. There is mild dural enhancement along the ventral and dorsal aspects of the spinal canal from T9 to the level of T11 and T12 which may be reactive in the setting of acute fracture. There is no evidence of nodule or mass-like enhancement. Lumbar spine: The conus medullaris extends to the L1-L2 level. SOFT TISSUES: Cervical: Unremarkable. Thoracic: Unremarkable. Lumbar: Cyst in the posterior aspect of the left kidney. CERVICAL DISC LEVELS: C2-C3: Small disc bulge without significant spinal canal or foraminal stenosis. C3-C4: No significant spinal canal stenosis.  Facet arthrosis slightly greater on the left. Mild left foraminal stenosis. C4-C5: Small disc bulge. Bilateral facet arthrosis. No significant spinal canal or foraminal stenosis. C5-C6: Diffuse disc bulge which indents the ventral thecal sac and likely contacts the ventral cervical cord with mild flattening of the cord. Bilateral facet arthrosis. Uncovertebral hypertrophy on the left. Mild left foraminal stenosis. C6-C7: Diffuse disc bulge which indents the ventral thecal sac with mild flattening of the ventral cervical cord and  bilateral facet arthrosis. No significant foraminal stenosis. C7-T1: No significant spinal canal or foraminal stenosis. THORACIC DISC LEVELS: T5-T6: Retropulsion of fracture fragments and facet arthrosis contributes to moderate foraminal stenosis bilaterally. T6-T7 through T8-T9: Multiple shallow disc bulges without significant spinal canal stenosis. Facet arthrosis at multiple levels. T10-T11: Additional moderate bilateral foraminal stenosis. Other thoracic levels: T1-T2, T2-T3, T3-T4, T4-T5, T9-T10, T11-T12: No significant disc herniation. No spinal canal stenosis or neural foraminal narrowing. LUMBAR DISC LEVELS: T12-L1: Mild facet arthrosis. No significant spinal canal or foraminal stenosis. L1-L2: Small disc bulge and mild facet arthrosis. No significant spinal canal or foraminal stenosis. L2-L3: Moderate disc height loss. Diffuse disc bulge. Small right subarticular disc protrusion. Mild to moderate facet arthrosis. No significant spinal canal stenosis or foraminal stenosis. L3-L4: Mild to moderate disc height loss. Diffuse disc bulge. Moderate facet arthrosis. Mild lateral recess narrowing and mild spinal canal stenosis. Bilateral foraminal disc protrusions. Mild bilateral foraminal stenosis slightly greater on the left. L4-L5: Diffuse disc bulge. Small central annular fissure. Mild lateral recess narrowing. Moderate facet arthrosis with bilateral facet effusions. Mild spinal canal stenosis. Mild foraminal stenosis on the left. L5-S1: Moderate to severe disc height loss. Small disc bulge. Mild to moderate facet arthrosis. No significant spinal canal stenosis. Mild right and moderate left foraminal stenosis. IMPRESSION: 1. Acute compression fracture of T5 with up to 80% height loss and 3 mm retropulsion, causing indentation of the thecal sac with mild ventral cord flattening and contributing to moderate bilateral foraminal stenosis at T5-T6. 2. Acute/subacute compression fractures involving T10T12 with edema  and enhancement, including approximately 50% height loss at T11 with 2 mm retropulsion and 55% height loss at T12 without significant retropulsion. 3. Additional probable subacute compression fractures at L2 and L5. Probable minimal superior endplate compression at L1. 4. Findings concerning for pathologic fractures in the setting of known hematologic malignancy. 5. Degenerative changes as above. No high-grade spinal canal stenosis. Moderate foraminal stenosis at multiple levels. Electronically signed by: Donnice Mania MD 10/02/2024 01:51 PM EDT RP Workstation: [id number]   MR CERVICAL SPINE W WO CONTRAST Result Date: 10/02/2024 EXAM: MRI CERVICAL, THORACIC, AND LUMBAR SPINE WITH AND WITHOUT CONTRAST 10/02/2024 12:34:56 PM TECHNIQUE: Multiplanar multisequence MRI of the cervical, thoracic, and lumbar spine was performed without and with the administration of 6 mL gadobutrol  (GADAVIST ) 1 MMOL/ML injection. COMPARISON: None available. CLINICAL HISTORY: Hematologic malignancy, monitor; History of CLL. On treatment. Now with severe back pain. MR Thoracic, Lumbar and Cervical w/wo: 6 ml Gadavist  (MULTIPLE, MULTIPLE REMINDERS TO HOLD STILL. PT. CONTINUED TO MOVE. WAS MEDICATED); Hx of CLL. Pt is here for pain management. Pt has back lower and upper back pain. FINDINGS: BONES AND ALIGNMENT: Cervical spine: Straightening of the normal cervical lordosis. No suspicious osseous lesions. Normal vertebral body heights. Marrow signal is unremarkable. No abnormal enhancement. Thoracic spine: There is edema throughout the T5 vertebral body with irregularity of the endplates and up to 80% height loss centrally compatible with acute compression fracture. There is approximately 3 mm retropulsion at the superior endplate  of T5. Additional mild chronic deformity of the T3 vertebral body with mild height loss. Additional edema in the T10 through T12 vertebral bodies with irregularity of the endplates and associated height loss. There  is approximately 50% height loss of the T11 vertebral body with 2 mm retropulsion at the superior endplate. Additional 55% height loss of the T12 vertebral body centrally without significant retropulsion. There is associated enhancement in the T10 through T12 vertebral bodies. For the remaining thoracic vertebral bodies (T1-T2, T4, T6-T9), vertebral body heights are normal, marrow signal is unremarkable, and there is no abnormal enhancement. Lumbar spine: Signal abnormality in the superior aspect of the L1 vertebral body with associated edema and irregularity of the superior endplate concerning for compression fracture with minimal height loss. There is additional irregularity of the L2 superior endplate with associated edema with no more than 20% height loss. Irregularity of the L5 superior endplate with up to 20% height loss and edema and mild enhancement which may reflect additional compression fracture. No significant retropulsion in the lumbar spine. For the remaining lumbar vertebral bodies (L3, L4), vertebral body heights are normal, marrow signal is unremarkable, and there is no abnormal enhancement. Overall: Degenerative endplate changes at multiple levels. SPINAL CORD: Cervical spine: Mild flattening of the ventral cervical cord at C5-C6 and C6-C7. Thoracic spine: Retropulsion of fracture fragments of T5 causes indentation of the thecal sac with mild flattening of the ventral cord. No definite cord signal abnormality at this level. There is mild dural enhancement along the ventral and dorsal aspects of the spinal canal from T9 to the level of T11 and T12 which may be reactive in the setting of acute fracture. There is no evidence of nodule or mass-like enhancement. Lumbar spine: The conus medullaris extends to the L1-L2 level. SOFT TISSUES: Cervical: Unremarkable. Thoracic: Unremarkable. Lumbar: Cyst in the posterior aspect of the left kidney. CERVICAL DISC LEVELS: C2-C3: Small disc bulge without  significant spinal canal or foraminal stenosis. C3-C4: No significant spinal canal stenosis. Facet arthrosis slightly greater on the left. Mild left foraminal stenosis. C4-C5: Small disc bulge. Bilateral facet arthrosis. No significant spinal canal or foraminal stenosis. C5-C6: Diffuse disc bulge which indents the ventral thecal sac and likely contacts the ventral cervical cord with mild flattening of the cord. Bilateral facet arthrosis. Uncovertebral hypertrophy on the left. Mild left foraminal stenosis. C6-C7: Diffuse disc bulge which indents the ventral thecal sac with mild flattening of the ventral cervical cord and bilateral facet arthrosis. No significant foraminal stenosis. C7-T1: No significant spinal canal or foraminal stenosis. THORACIC DISC LEVELS: T5-T6: Retropulsion of fracture fragments and facet arthrosis contributes to moderate foraminal stenosis bilaterally. T6-T7 through T8-T9: Multiple shallow disc bulges without significant spinal canal stenosis. Facet arthrosis at multiple levels. T10-T11: Additional moderate bilateral foraminal stenosis. Other thoracic levels: T1-T2, T2-T3, T3-T4, T4-T5, T9-T10, T11-T12: No significant disc herniation. No spinal canal stenosis or neural foraminal narrowing. LUMBAR DISC LEVELS: T12-L1: Mild facet arthrosis. No significant spinal canal or foraminal stenosis. L1-L2: Small disc bulge and mild facet arthrosis. No significant spinal canal or foraminal stenosis. L2-L3: Moderate disc height loss. Diffuse disc bulge. Small right subarticular disc protrusion. Mild to moderate facet arthrosis. No significant spinal canal stenosis or foraminal stenosis. L3-L4: Mild to moderate disc height loss. Diffuse disc bulge. Moderate facet arthrosis. Mild lateral recess narrowing and mild spinal canal stenosis. Bilateral foraminal disc protrusions. Mild bilateral foraminal stenosis slightly greater on the left. L4-L5: Diffuse disc bulge. Small central annular fissure. Mild lateral  recess narrowing. Moderate facet arthrosis with bilateral facet effusions. Mild spinal canal stenosis. Mild foraminal stenosis on the left. L5-S1: Moderate to severe disc height loss. Small disc bulge. Mild to moderate facet arthrosis. No significant spinal canal stenosis. Mild right and moderate left foraminal stenosis. IMPRESSION: 1. Acute compression fracture of T5 with up to 80% height loss and 3 mm retropulsion, causing indentation of the thecal sac with mild ventral cord flattening and contributing to moderate bilateral foraminal stenosis at T5-T6. 2. Acute/subacute compression fractures involving T10T12 with edema and enhancement, including approximately 50% height loss at T11 with 2 mm retropulsion and 55% height loss at T12 without significant retropulsion. 3. Additional probable subacute compression fractures at L2 and L5. Probable minimal superior endplate compression at L1. 4. Findings concerning for pathologic fractures in the setting of known hematologic malignancy. 5. Degenerative changes as above. No high-grade spinal canal stenosis. Moderate foraminal stenosis at multiple levels. Electronically signed by: Donnice Mania MD 10/02/2024 01:50 PM EDT RP Workstation: [id number]   CT TIBIA FIBULA LEFT W CONTRAST Result Date: 10/02/2024 EXAM: CT Left Lower Extremity, With IV Contrast, 10/02/2024 12:58:34 PM TECHNIQUE: Axial images were acquired through the left lower extremity with IV contrast. Reformatted images were reviewed. Automated exposure control, iterative reconstruction, and/or weight based adjustment of the mA/kV was utilized to reduce the radiation dose to as low as reasonably achievable. COMPARISON: None available. CLINICAL HISTORY: Subcutaneous nodules of the calf. History of chronic lymphocytic leukemia. FINDINGS: BONES AND JOINTS: Severe osteoarthritis of the knee is suspected with a small free osteochondral fragment anteriorly in the knee joint just anterior to the tibial spine. 2  chronic lag screws are present in the medial malleolus. No acute bony findings. SOFT TISSUES: The soft tissues are remarkable for a 1.5 x 1.5 x 1.8 cm soft tissue nodule in the subcutaneous tissues of the proximal calf, present on image 44, series 9. This nodule has a more solid central component measuring 0.6 x 1.5 cm and a peripheral fatty infiltrate component. Possibilities may include inflammatory lesions or subcutaneous malignancy. Consider biopsy. Additionally, there are smaller clustered nodules on images 49 through 63 of series 6 in the medial proximal calf subcutaneous tissues, with the index nodule measuring 0.6 cm in diameter on image 54, series 6. A small knee joint effusion is also present. IMPRESSION: 1. Severe osteoarthritis of the left knee with suspected small free osteochondral fragment anterior to the tibial spine. 2. Soft tissue nodule in the subcutaneous tissues of the proximal left calf (1.5 x 1.5 x 1.8 cm) with solid central component and peripheral fatty component; inflammatory lesion or subcutaneous malignancy considered. Consider biopsy. 3. Smaller clustered subcutaneous nodules in the medial proximal left calf, index nodule 0.6 cm. 4. Small left knee joint effusion. 5. Chronic lag screws in the medial malleolus. Electronically signed by: Ryan Salvage MD 10/02/2024 01:29 PM EDT RP Workstation: [id number]   DG CHEST PORT 1 VIEW Result Date: 10/01/2024 CLINICAL DATA:  Shortness of breath, cough EXAM: PORTABLE CHEST 1 VIEW COMPARISON:  07/11/2020 FINDINGS: Linear left basilar atelectasis. Right lung clear. No effusions. Heart and mediastinal contours are within normal limits. No acute bony abnormality. IMPRESSION: Left base atelectasis. No active disease. Electronically Signed   By: Franky Crease M.D.   On: 10/01/2024 21:56      IMPRESSION/PLAN: 1. 78 y.o. woman with severe back pain secondary to acute, suspected pathologic, compression fracture at T5 with 80% height loss and a 3  mm retropulsion causing moderate bilateral foraminal stenosis  at T5-T6, with unknown primary although she does have a history of CLL.  Today, I talked to the patient and family about the findings and workup thus far. We discussed the natural history of metastatic disease and general treatment, highlighting the role of radiotherapy in the management. Since her pain is now better controled and she remains neurologically intact without evidence of impending cord compression on imaging, ideally, we will await final pathology from the biopsy performed today before making formal treatment recommendations. However, if she develops neurologic compromise/deficit prior to pathology being available, we could always proceed with more emergent radiation if needed. We would recommend neuro checks by nursing every 4-6 hours. The patient was encouraged to ask questions that were answered to her stated satisfaction.   I personally spent 70 minutes in this encounter including chart review, reviewing radiological studies, meeting face-to-face with the patient, entering orders, coordinating care and completing documentation.    Sabra MICAEL Rusk, PA-C   Norleen CANDIE Limes, MD, PhD   [facility]  Radiation Oncology Direct Dial: [phone]  Fax: [phone] [email or web address]  Skype  LinkedIn

## 2024-10-03 NOTE — Progress Notes (Signed)
   10/03/24 1049  TOC Brief Assessment  Insurance and Status Reviewed  Patient has primary care physician Yes  Home environment has been reviewed home alone  Prior level of function: independent  Prior/Current Home Services No current home services  Social Drivers of Health Review SDOH reviewed no interventions necessary  Readmission risk has been reviewed Yes  Transition of care needs no transition of care needs at this time

## 2024-10-04 ENCOUNTER — Inpatient Hospital Stay (HOSPITAL_COMMUNITY)

## 2024-10-04 DIAGNOSIS — C9110 Chronic lymphocytic leukemia of B-cell type not having achieved remission: Secondary | ICD-10-CM | POA: Diagnosis not present

## 2024-10-04 HISTORY — PX: IR BONE MARROW BIOPSY & ASPIRATION: IMG5727

## 2024-10-04 LAB — CBC WITH DIFFERENTIAL/PLATELET
Abs Immature Granulocytes: 0.24 K/uL — ABNORMAL HIGH (ref 0.00–0.07)
Basophils Absolute: 0 K/uL (ref 0.0–0.1)
Basophils Relative: 1 %
Eosinophils Absolute: 0 K/uL (ref 0.0–0.5)
Eosinophils Relative: 0 %
HCT: 37 % (ref 36.0–46.0)
Hemoglobin: 11.6 g/dL — ABNORMAL LOW (ref 12.0–15.0)
Immature Granulocytes: 5 %
Lymphocytes Relative: 11 %
Lymphs Abs: 0.5 K/uL — ABNORMAL LOW (ref 0.7–4.0)
MCH: 30.2 pg (ref 26.0–34.0)
MCHC: 31.4 g/dL (ref 30.0–36.0)
MCV: 96.4 fL (ref 80.0–100.0)
Monocytes Absolute: 0.5 K/uL (ref 0.1–1.0)
Monocytes Relative: 10 %
Neutro Abs: 3.6 K/uL (ref 1.7–7.7)
Neutrophils Relative %: 73 %
Platelets: 202 K/uL (ref 150–400)
RBC: 3.84 MIL/uL — ABNORMAL LOW (ref 3.87–5.11)
RDW: 14 % (ref 11.5–15.5)
WBC: 4.9 K/uL (ref 4.0–10.5)
nRBC: 0 % (ref 0.0–0.2)

## 2024-10-04 LAB — SURGICAL PATHOLOGY

## 2024-10-04 LAB — COMPREHENSIVE METABOLIC PANEL WITH GFR
ALT: 9 U/L (ref 0–44)
AST: 20 U/L (ref 15–41)
Albumin: 3.8 g/dL (ref 3.5–5.0)
Alkaline Phosphatase: 148 U/L — ABNORMAL HIGH (ref 38–126)
Anion gap: 10 (ref 5–15)
BUN: 11 mg/dL (ref 8–23)
CO2: 23 mmol/L (ref 22–32)
Calcium: 9.5 mg/dL (ref 8.9–10.3)
Chloride: 105 mmol/L (ref 98–111)
Creatinine, Ser: 0.67 mg/dL (ref 0.44–1.00)
GFR, Estimated: >60 mL/min (ref >60)
Glucose, Bld: 132 mg/dL — ABNORMAL HIGH (ref 70–99)
Potassium: 3.8 mmol/L (ref 3.5–5.1)
Sodium: 139 mmol/L (ref 135–145)
Total Bilirubin: 0.3 mg/dL (ref 0.0–1.2)
Total Protein: 5.8 g/dL — ABNORMAL LOW (ref 6.5–8.1)

## 2024-10-04 MED ORDER — MIDAZOLAM HCL (PF) 2 MG/2ML IJ SOLN
INTRAMUSCULAR | Status: AC | PRN
  Administered 2024-10-04 (×2): 1 mg via INTRAVENOUS

## 2024-10-04 MED ORDER — MIDAZOLAM HCL 2 MG/2ML IJ SOLN
INTRAMUSCULAR | Status: AC
  Filled 2024-10-04: qty 2

## 2024-10-04 MED ORDER — FENTANYL CITRATE (PF) 100 MCG/2ML IJ SOLN
INTRAMUSCULAR | Status: AC
  Filled 2024-10-04: qty 2

## 2024-10-04 MED ORDER — LIDOCAINE HCL (PF) 1 % IJ SOLN
20.0000 mL | Freq: Once | INTRAMUSCULAR | Status: AC
  Administered 2024-10-04: 20 mL
  Filled 2024-10-04: qty 20

## 2024-10-04 MED ORDER — LIDOCAINE HCL (PF) 1 % IJ SOLN
INTRAMUSCULAR | Status: AC
  Filled 2024-10-04: qty 30

## 2024-10-04 MED ORDER — FENTANYL CITRATE (PF) 100 MCG/2ML IJ SOLN
INTRAMUSCULAR | Status: AC | PRN
  Administered 2024-10-04 (×2): 50 ug via INTRAVENOUS

## 2024-10-04 NOTE — Plan of Care (Signed)
   Problem: Clinical Measurements: Goal: Diagnostic test results will improve Outcome: Progressing

## 2024-10-04 NOTE — Plan of Care (Signed)
  Problem: Clinical Measurements: Goal: Ability to maintain clinical measurements within normal limits will improve Outcome: Progressing Goal: Diagnostic test results will improve Outcome: Progressing   Problem: Activity: Goal: Risk for activity intolerance will decrease Outcome: Progressing   Problem: Elimination: Goal: Will not experience complications related to urinary retention Outcome: Progressing   Problem: Pain Managment: Goal: General experience of comfort will improve and/or be controlled Outcome: Progressing   Problem: Safety: Goal: Ability to remain free from injury will improve Outcome: Progressing

## 2024-10-04 NOTE — Progress Notes (Signed)
 PROGRESS NOTE  [name] FMW:[id number] DOB: Dec 22, 1945 DOA: 10/01/2024 PCP: Medicine, [facility] (Inactive)   LOS: 1 day   Brief narrative:   [name] is a 78 y.o. 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  Patient has been seen by oncology Dr. Timmy for her CLL and is currently on Gazyva /Brukinsa .  She had a recent oncology follow-up office visit in 10/7 for worsening back pain and had recently undergone an MRI at [facility] 2 weeks ago on 9/29 which showed acute/subacute superior endplate compression of the L5 infiltrative marrow lesions.  She also had PET scan done 09/27/2024 showed increased uptake in the multiple thoracic lumbar vertebra consistent with metastasis.   Oncology had also noted a nodule under the skin of her left lower leg and concerned about possible transformation to high-grade lymphoma.  Patient has had back pain going on for 3 months but has been worse for the past 3 weeks, not helped with hydrocodone  at home.  Denies any lower extremity weakness or saddle anesthesia.  In the ED patient was noted to be hypertensive.  Labs were unremarkable except for elevated alkaline phosphatase.  Urine analysis was negative.   Patient was given oxycodone  and IV fentanyl  and was considered for admission to the hospital for further evaluation and treatment.  During hospitalization patient has been seen by oncology who has recommended biopsy of the leg and bone marrow biopsy for further evaluation.  Follow oncology recommendations  Assessment/Plan: Principal Problem:   Back pain Active Problems:   Hypothyroidism   Essential hypertension   DOE (dyspnea on exertion)   CLL (chronic  lymphocytic leukemia) (HCC)  Acute on chronic low back pain CLL currently on treatment Recent MRI of the back with findings suggestive of metastasis.  She has a left lower leg subcutaneous nodule on exam and oncology concerned about possible transformation to high-grade lymphoma.  Patient has undergone ultrasound-guided core needle biopsy of the nodule on 10/03/2024   Continue pain management with Oxy IR as needed, Tylenol  as needed, and lidocaine  patch.  Patient also underwent fluoroscopic-guided bone marrow biopsy on 10/04/2024 at the request of oncology.  Follow oncology recommendations.  Pathological fracture of T5 T10 T12-L1.  Likely contributing to back pain.  Oncology on board and has been started on dexamethasone .  Seen by physical therapy and no therapy needs were identified.  Oncology communicating with radiation oncology for radiation needs.   Dyspnea on exertion, cough Lungs clear on exam.  Chest x-ray without infiltrate.  Currently stable.  Hypertension Continue pain management and  amlodipine .  Continue as needed IV labetalol PRN SBP >160.   History of DVT/PE in 2019 On Xarelto  as outpatient.  Plan for CT-guided bone marrow biopsy so on heparin  subcu. can restart her Xarelto  from tomorrow   Hypothyroidism Continue levothyroxine .   Left leg syndrome Continue ropinirole .   Dementia Continue memantine.  Delirium precautions.   Asthma Stable, no signs of acute exacerbation.  Continue home inhalers.   GERD Continue famotidine .   Glaucoma Continue latanoprost  eyedrops.   Anxiety/depression/insomnia Continue duloxetine, venlafaxine , and trazodone.   Diet controlled type 2 diabetes Hemoglobin A1c was 6.9 in March 2019.  Repeat hemoglobin A1c at 5.6..   DVT prophylaxis: heparin  injection 5,000 Units Start: 10/02/24 0900   Disposition: Likely  home when okay with oncology likely in 1 to 2 days.  Status is: Inpatient The patient is  inpatient because: Back pain under  workup, repeat MRI and CT guided bone marrow biopsy biopsy, pending clinical improvement    Code Status:     Code Status: Full Code  Family Communication: Spoke with patient's family at bedside.  Consultants: Oncology  Procedures: Left leg ultrasound-guided nodule biopsy on 10/03/2024 Fluoroscopic guided bone marrow biopsy 10/04/2024  Anti-infectives:  none  Anti-infectives (From admission, onward)    None      Subjective: Today, patient was seen and examined at bedside.  Seen after bone marrow biopsy.  Patient states that she feels okay.  Denies any new complaint.  Objective: Vitals:   10/04/24 1100 10/04/24 1344  BP: (!) 162/79 (!) 161/72  Pulse: 81 92  Resp: 14 18  Temp:  97.8 F (36.6 C)  SpO2: 97% 95%    Intake/Output Summary (Last 24 hours) at 10/04/2024 1531 Last data filed at 10/04/2024 0200 Gross per 24 hour  Intake 1961.74 ml  Output --  Net 1961.74 ml   There were no vitals filed for this visit. There is no height or weight on file to calculate BMI.   Physical Exam:  General:  Average built, not in obvious distress, elderly female, Communicative, HENT:   Mild pallor noted.  Oral mucosa is moist.  Chest:  Clear breath sounds.  Diminished breath sounds bilaterally. No crackles or wheezes.  CVS: S1 &S2 heard. No murmur.  Regular rate and rhythm. Abdomen: Soft, nontender, nondistended.  Bowel sounds are heard.   Extremities: No cyanosis, clubbing or edema.  Peripheral pulses are palpable.  Left upper leg with status post biopsy and bandage. Psych: Alert, awake and oriented, normal mood CNS:  No cranial nerve deficits.  Moving all extremities. Skin: Warm and dry.  No rashes noted.   Data Review: I have personally reviewed the following laboratory data and studies,  CBC: Recent Labs  Lab 10/01/24 1552 10/03/24 0704 10/04/24 0455  WBC 5.7 3.8* 4.9  NEUTROABS 3.2  --  3.6  HGB 13.3 12.6 11.6*  HCT 41.9 40.5 37.0  MCV 94.8 96.9 96.4  PLT  215 199 202   Basic Metabolic Panel: Recent Labs  Lab 10/01/24 1552 10/03/24 0704 10/04/24 0455  NA 139 139 139  K 4.2 3.8 3.8  CL 103 105 105  CO2 24 24 23   GLUCOSE 141* 113* 132*  BUN 11 8 11   CREATININE 0.83 0.72 0.67  CALCIUM 10.2 9.4 9.5  MG  --  2.0  --   PHOS  --  3.4  --    Liver Function Tests: Recent Labs  Lab 10/01/24 1552 10/03/24 0704 10/04/24 0455  AST 20 18 20   ALT 9 9 9   ALKPHOS 175* 156* 148*  BILITOT 0.4 0.4 0.3  PROT 6.5 6.0* 5.8*  ALBUMIN 4.1 3.8 3.8   No results for input(s): LIPASE, AMYLASE in the last 168 hours. No results for input(s): AMMONIA in the last 168 hours. Cardiac Enzymes: No results for input(s): CKTOTAL, CKMB, CKMBINDEX, TROPONINI in the last 168 hours. BNP (last 3 results) No results for input(s): BNP in the last 8760 hours.  ProBNP (last 3 results) No results for input(s): PROBNP in the last 8760 hours.  CBG: No results for input(s): GLUCAP in the last 168 hours. No results found for this or any previous visit (from the past 240 hours).   Studies: IR BONE MARROW BIOPSY & ASPIRATION  Result Date: 10/04/2024 INDICATION: History of CLL. EXAM: FLUOROSCOPIC GUIDED BONE MARROW BIOPSY AND ASPIRATION MEDICATIONS: None FLUOROSCOPY: Radiation Exposure Index and estimated peak skin dose (PSD); Reference air kerma (RAK), 1 mGy. ANESTHESIA/SEDATION: Moderate (conscious) sedation was employed during this procedure. A total of Versed  2 mg and Fentanyl  100 mcg was administered intravenously. Moderate Sedation Time: 10 minutes. The patient's level of consciousness and vital signs were monitored continuously by radiology nursing throughout the procedure under my direct supervision. COMPLICATIONS: None immediate. PROCEDURE: Informed consent was obtained from the patient following an explanation of the procedure, risks, benefits and alternatives. The patient understands, agrees and consents for the procedure. All questions were  addressed. A time out was performed prior to the initiation of the procedure. The patient was positioned prone on the fluoroscopy table and the posterior aspect of the RIGHT iliac crest was marked fluoroscopically. The operative site was prepped and draped in the usual sterile fashion. Under sterile conditions and local anesthesia, an 11 gauge coaxial bone biopsy needle was advanced into the posterior aspect of the RIGHT iliac marrow space under intermittent fluoroscopic guidance. Multiple fluoroscopic images were saved procedural documentation purposes. Initially, a bone marrow aspiration was performed. Next, a bone marrow biopsy was obtained with the 11 gauge outer bone marrow device. Samples were prepared with the cytotechnologist and deemed adequate. The needle was removed and superficial hemostasis was obtained with manual compression. A dressing was applied. The patient tolerated the procedure well without immediate post procedural complication. IMPRESSION: Successful fluoroscopic-guided RIGHT iliac bone marrow aspiration and core biopsy. Thom Hall, MD [facility] Radiology Electronically Signed   By: Thom Hall M.D.   On: 10/04/2024 14:26   US  CORE BIOPSY (SOFT TISSUE) Result Date: 10/03/2024 CLINICAL DATA:  Soft tissue nodule left lower extremity. EXAM: MUSCLE/SOFT TISSUE CORE BIOPSY ANESTHESIA/SEDATION: 1% lidocaine  MEDICATIONS: None PROCEDURE: None COMPLICATIONS: None immediate FINDINGS: Ultrasound imaging of the pretibial region in the left lower extremity demonstrates an irregular soft tissue nodule in the subcutaneous tissue with a hypoechoic with center and hyperechoic border with minimal vascularity. The left lower extremity was prepped and draped in usual sterile fashion. Local anesthesia was achieved by infiltrating subcutaneous tissue with 1% lidocaine . A small incision was created in the patient's skin and the 18 gauge super core biopsy  needle was advanced under ultrasound guidance until the needle tip was on the proximal edge. The biopsy needle was deployed and a sample was obtained. Samples placed in formalin. A second sample was obtained in similar fashion. Post biopsy color flow images demonstrate no active hemorrhage. Sterile dressing applied. IMPRESSION: Satisfactory core needle biopsy of a left lower extremity soft tissue nodule as described above. Electronically Signed   By: Cordella Banner   On: 10/03/2024 10:49    Vernal Alstrom, MD  [facility] 10/04/2024  If 7PM-7AM, please contact night-coverage

## 2024-10-04 NOTE — Procedures (Signed)
 Vascular and Interventional Radiology Procedure Note  Patient: [name] DOB: [birth year] Medical Record Number: [id number] Note Date/Time: 10/04/24 10:16 AM   Performing Physician: Thom Hall, MD Assistant(s): None  Diagnosis: Hx CLL. Prior BMBx 05/15/24   Procedure: BONE MARROW ASPIRATION and BIOPSY  Anesthesia: Conscious Sedation Complications: None Estimated Blood Loss: Minimal Specimens: Sent for Pathology  Findings:  Successful Fluoroscopy-guided bone marrow aspiration and biopsy A total of 1 cores were obtained. Hemostasis of the tract was achieved using Manual Pressure.  Plan: Bed rest for 1 hours.  See detailed procedure note with images in PACS. The patient tolerated the procedure well without incident or complication and was returned to Recovery in stable condition.    Thom Hall, MD [facility] Radiology   Pager. [phone] Clinic. [phone]

## 2024-10-04 NOTE — Sedation Documentation (Signed)
 Patient transported back to 3 east. Placed on bedside dinamap for further monitoring x1 hour bedrest.

## 2024-10-04 NOTE — Progress Notes (Signed)
 Ms. Boyington is doing okay.  She really enjoys the air mattress.  This is helping her back.  She had biopsies of the subcutaneous nodules in the left lower leg yesterday.  I will have to call pathology to see if we can get some kind of preliminary report.  She goes for the bone marrow biopsy today.  Her labs show a sodium 139.  Potassium 3.8.  BUN 11 creatinine 0.67.  Calcium 9.5 with an albumin of 3.8.  The alkaline phosphate is 148.  White cell count is 4.9.  Hemoglobin 11.6.  Platelet count 202,000.  Her appetite is okay.  She has had no nausea or vomiting.  She is urinating quite a bit.  I think we can probably cut back on the IV fluids a little bit.  She has had no fever.  There is been no bleeding.  She has had no diarrhea.  Her vital signs show temperature of 97.9.  Pulse 86.  Blood pressure 151/71.  Her lungs sound clear bilaterally.  She has good air movement bilaterally.  Cardiac exam regular rate and rhythm.  There are no murmurs.  Abdomen is soft.  Bowel sounds are present.  There is no fluid wave.  Extremities shows the biopsy wound in the left lower leg.  There is no swelling.  Back exam does show some tenderness to palpation in the mid and lower spine.  I spoke with Dr. Shannon of Radiation Oncology yesterday.  He or one of his colleagues will see her for radiation therapy.  It would be nice to know exactly what is causing the issue before he starts treatment.  I think she did get a dose of Zometa yesterday.  Again, I think we can decrease the IV fluid rate a little bit.  Hopefully, we can get some pulmonary result from the leg biopsy.  I do appreciate everybody's help on 3 E.    Jeralyn Crease, MD  Proverbs 18:10

## 2024-10-05 DIAGNOSIS — C9110 Chronic lymphocytic leukemia of B-cell type not having achieved remission: Secondary | ICD-10-CM | POA: Diagnosis not present

## 2024-10-05 LAB — CBC WITH DIFFERENTIAL/PLATELET
Abs Immature Granulocytes: 0.29 K/uL — ABNORMAL HIGH (ref 0.00–0.07)
Basophils Absolute: 0 K/uL (ref 0.0–0.1)
Basophils Relative: 1 %
Eosinophils Absolute: 0 K/uL (ref 0.0–0.5)
Eosinophils Relative: 0 %
HCT: 35 % — ABNORMAL LOW (ref 36.0–46.0)
Hemoglobin: 11.4 g/dL — ABNORMAL LOW (ref 12.0–15.0)
Immature Granulocytes: 6 %
Lymphocytes Relative: 17 %
Lymphs Abs: 0.8 K/uL (ref 0.7–4.0)
MCH: 31.4 pg (ref 26.0–34.0)
MCHC: 32.6 g/dL (ref 30.0–36.0)
MCV: 96.4 fL (ref 80.0–100.0)
Monocytes Absolute: 0.4 K/uL (ref 0.1–1.0)
Monocytes Relative: 8 %
Neutro Abs: 3 K/uL (ref 1.7–7.7)
Neutrophils Relative %: 68 %
Platelets: 209 K/uL (ref 150–400)
RBC: 3.63 MIL/uL — ABNORMAL LOW (ref 3.87–5.11)
RDW: 14 % (ref 11.5–15.5)
WBC: 4.5 K/uL (ref 4.0–10.5)
nRBC: 0 % (ref 0.0–0.2)

## 2024-10-05 LAB — BASIC METABOLIC PANEL WITH GFR
Anion gap: 10 (ref 5–15)
BUN: 10 mg/dL (ref 8–23)
CO2: 23 mmol/L (ref 22–32)
Calcium: 8.8 mg/dL — ABNORMAL LOW (ref 8.9–10.3)
Chloride: 106 mmol/L (ref 98–111)
Creatinine, Ser: 0.62 mg/dL (ref 0.44–1.00)
GFR, Estimated: >60 mL/min (ref >60)
Glucose, Bld: 111 mg/dL — ABNORMAL HIGH (ref 70–99)
Potassium: 3.5 mmol/L (ref 3.5–5.1)
Sodium: 139 mmol/L (ref 135–145)

## 2024-10-05 LAB — CANCER ANTIGEN 27.29: CA 27.29: 18.1 U/mL (ref 0.0–38.6)

## 2024-10-05 MED ORDER — DEXAMETHASONE 4 MG PO TABS
12.0000 mg | ORAL_TABLET | Freq: Every day | ORAL | Status: DC
  Administered 2024-10-05: 12 mg via ORAL
  Filled 2024-10-05: qty 3

## 2024-10-05 MED ORDER — FAMOTIDINE 20 MG PO TABS: 20.0000 mg | ORAL_TABLET | Freq: Two times a day (BID) | ORAL | Status: DC

## 2024-10-05 MED ORDER — DEXAMETHASONE 6 MG PO TABS: 12.0000 mg | ORAL_TABLET | Freq: Every day | ORAL | 0 refills | Status: DC

## 2024-10-05 MED ORDER — FLUCONAZOLE 100 MG PO TABS
100.0000 mg | ORAL_TABLET | Freq: Every day | ORAL | Status: DC
  Administered 2024-10-05: 100 mg via ORAL
  Filled 2024-10-05: qty 1

## 2024-10-05 NOTE — Progress Notes (Signed)
 Shockingly, the biopsy of the leg nodule is negative.  This only shows organized blood clot.  This is a hematoma.  Again, I am surprised.  She had her bone marrow biopsy yesterday.  I probably will not have those results back for another several days.  I think we probably can let her go home.  She is deciding whether or not she wants to have radiation therapy.  She will still think about this.  I told her that I thought it would be a very good idea for her to have radiation therapy.  I explained to her that it is not a full course of radiation that she will get.  I know that she apparently has been hearing from people who have not told her about issues with radiation.  I tried to convince her that the radiation that she will get would not cause her to have problems.  Her pain is doing a lot better.  She is out of bed.  She is going to the bathroom.  Her appetite is doing okay.  She is having no problems with nausea or vomiting.  Again, she wants to go home.  I talked to her daughter yesterday.  Her daughter is waiting for her to go home.  Her labs today show white cell count 4.5.  Hemoglobin 9.4.  Platelet count 209,000.  Her vital signs show temperature 98.1.  Pulse 82.  Blood pressure 174/70.  Her lungs are clear bilaterally.  Cardiac exam regular rate and rhythm.  She has no murmurs, rubs or bruits.  Abdomen soft.  Bowel sounds are present.  She has no fluid wave.  There is no palpable liver or spleen tip.  Extremities show no clubbing, cyanosis or edema.  Neurological exam shows no focal neurological deficits.  I still not sure as to what is going on with her spine.  Again, never seen CLL because spinal destruction.  I just wonder about the possibility of transformation to a lymphoma.  Again, I really believe that radiation would help her.  I do suspect that she probably will need to be on some steroids at home.  We will have to follow-up with her once the bone marrow biopsy result comes  back.  She will talk to her daughter about the possibility of radiation therapy.  Jeralyn Crease, MD  Hebrews 12:12

## 2024-10-05 NOTE — Progress Notes (Signed)
 Discharge instructions discussed with patient and family, verbalized agreement and understanding

## 2024-10-05 NOTE — Discharge Summary (Signed)
 Physician Discharge Summary  [name]:[id number] DOB: Sep 07, 1946 DOA: 10/01/2024  PCP: Medicine, [facility] (Inactive)  Admit date: 10/01/2024 Discharge date: 10/05/2024  Admitted From: Home Disposition: Home  Recommendations for Outpatient Follow-up:  Follow up oncology in 1 week, discuss bone marrow biopsy findings, further management. Follow up in ED if symptoms worsen or new appear   Home Health: No Equipment/Devices: None  Discharge Condition: Stable CODE STATUS: Full Diet recommendation: Heart healthy  Brief/Interim Summary:  [name] is a 78 y.o. 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  Patient has been seen by oncology Dr. Timmy for her CLL and is currently on Gazyva /Brukinsa .  She had a recent oncology follow-up office visit in 10/7 for worsening back pain and had recently undergone an MRI at [facility] 2 weeks ago on 9/29 which showed acute/subacute superior endplate compression of the L5 infiltrative marrow lesions.  She also had PET scan done 09/27/2024 showed increased uptake in the multiple thoracic lumbar vertebra consistent with metastasis.   Oncology had also noted a nodule under the skin of her left lower leg and concerned about possible transformation to high-grade lymphoma.  Patient has had back pain going on for 3 months but has been worse for the past 3 weeks, not helped with hydrocodone  at home.  Denies any lower extremity weakness or saddle anesthesia.  In the ED patient was noted to be hypertensive.  Labs were unremarkable except for elevated alkaline phosphatase.  Urine analysis was negative.   Patient was given oxycodone  and IV fentanyl  and was considered  for admission to the hospital for further evaluation and treatment.   During hospitalization patient has been seen by oncology.  MRI revealed pathological fracture T5 T10 T12-L1  Patient underwent biopsy of the left lower leg subcutaneous nodule which was negative for cancer, findings consistent with hematoma. Patient also underwent fluoroscopic guided bone marrow biopsy on 10/04/2024, path pending. She is receiving Decadron  12 mg daily for back pain.  Oncology discussed about radiation as an option however patient will require further discussion.  She need to follow-up with oncology outpatient in about a week to discuss path findings and further management. Decadron  12 mg daily prescribed for 2 weeks at this time.  Will defer dosing/taper to oncology. Patient can resume Xarelto  for DVT/PE.  She will continue her chronic medications including allopurinol , memantine amlodipine  Continue duloxetine, venlafaxine , and trazodone.etc. Discussed with patient.  Discussed with daughter over the phone.  She is comfortable with patient going home.   Discharge Diagnoses:  Principal Problem:   Back pain Active Problems:   Hypothyroidism   Essential hypertension   DOE (dyspnea on exertion)   CLL (chronic lymphocytic leukemia) (HCC)    Discharge Instructions  Discharge Instructions     Call MD for:  persistant nausea and vomiting   Complete by: As directed    Call MD for:  severe uncontrolled pain   Complete by: As directed    Call MD for:  temperature >100.4   Complete by: As directed    Diet - low sodium heart healthy   Complete by: As directed    Discharge instructions   Complete by: As directed    1.  Follow-up with PCP in 1 week 2. Follow-up with oncology in 1 to  2 weeks.   Increase activity slowly   Complete by: As directed    Leave dressing on - Keep it clean, dry, and intact until clinic visit   Complete by: As directed       Allergies as of 10/05/2024       Reactions    Naproxen Shortness Of Breath, Itching, Swelling, Other (See Comments)   Angioedema   Tizanidine  Shortness Of Breath, Other (See Comments)   Dizziness, also        Medication List     TAKE these medications    Advair Diskus 250-50 MCG/DOSE Aepb Generic drug: fluticasone -salmeterol Inhale one puff into the lungs 2 (two) times daily. What changed: See the new instructions.   albuterol  108 (90 Base) MCG/ACT inhaler Commonly known as: ProAir  HFA Inhale 2 puffs into the lungs every 4 (four) hours as needed for wheezing or shortness of breath.   albuterol  (2.5 MG/3ML) 0.083% nebulizer solution Commonly known as: PROVENTIL  Take 3 mLs (2.5 mg total) by nebulization every 6 (six) hours as needed for wheezing or shortness of breath.   amLODipine  10 MG tablet Commonly known as: NORVASC  Take 10 mg by mouth daily.   Brukinsa  80 MG capsule Generic drug: zanubrutinib  Take 2 capsules (160 mg total) by mouth 2 (two) times daily.   cetirizine  10 MG tablet Commonly known as: ZYRTEC  Take 10 mg by mouth daily.   dexamethasone  6 MG tablet Commonly known as: DECADRON  Take 2 tablets (12 mg total) by mouth daily. Start taking on: October 06, 2024   DULoxetine 60 MG capsule Commonly known as: CYMBALTA Take 60 mg by mouth daily.   famciclovir  250 MG tablet Commonly known as: FAMVIR  Take 1 tablet (250 mg total) by mouth daily.   famotidine  20 MG tablet Commonly known as: PEPCID  Take 20 mg by mouth at bedtime.   ferrous sulfate  324 MG Tbec Take 324 mg by mouth daily with breakfast.   fluticasone  50 MCG/ACT nasal spray Commonly known as: FLONASE  Place 2 sprays into both nostrils daily. What changed: when to take this   HYDROcodone -acetaminophen  7.5-325 MG tablet Commonly known as: NORCO Take 1 tablet by mouth See admin instructions. Take 1 tablet by mouth at 7 AM & 8 PM and additional 1 tablet twice a day as needed for unresolved pain   latanoprost  0.005 % ophthalmic  solution Commonly known as: XALATAN  Place 1 drop into both eyes at bedtime.   levothyroxine  50 MCG tablet Commonly known as: SYNTHROID  Take 50 mcg by mouth daily before breakfast.   meclizine  25 MG tablet Commonly known as: ANTIVERT  Take 25 mg by mouth 3 (three) times daily as needed for dizziness.   memantine 10 MG tablet Commonly known as: NAMENDA Take 10 mg by mouth in the morning and at bedtime.   methocarbamol 500 MG tablet Commonly known as: ROBAXIN Take 500 mg by mouth 2 (two) times daily as needed for muscle spasms.   montelukast  10 MG tablet Commonly known as: SINGULAIR  Take 10 mg by mouth at bedtime.   ondansetron  8 MG tablet Commonly known as: Zofran  Take 1 tablet (8 mg total) by mouth every 8 (eight) hours as needed for nausea or vomiting.   pantoprazole  40 MG tablet Commonly known as: PROTONIX  Take 40 mg by mouth at bedtime.   prochlorperazine  10 MG tablet Commonly known as: COMPAZINE  Take 1 tablet (10 mg total) by mouth every 6 (six) hours as needed for nausea or vomiting.   rOPINIRole  2 MG tablet Commonly known  as: REQUIP  Take 3 mg by mouth at bedtime.   traMADol  50 MG tablet Commonly known as: ULTRAM  Take 1 tablet (50 mg total) by mouth every 6 (six) hours as needed.   traZODone 50 MG tablet Commonly known as: DESYREL Take 50 mg by mouth at bedtime.   TYLENOL  500 MG tablet Generic drug: acetaminophen  Take 1,000 mg by mouth every 8 (eight) hours as needed (for pain or headaches).   Vitamin D (Ergocalciferol) 1.25 MG (50000 UNIT) Caps capsule Commonly known as: DRISDOL Take 50,000 Units by mouth every Sunday.   Xarelto 10 MG Tabs tablet Generic drug: rivaroxaban TAKE ONE TABLET BY MOUTH EVERY DAY WITH SUPPER What changed: See the new instructions.               Discharge Care Instructions  (From admission, onward)           Start     Ordered   10/05/24 0000  Leave dressing on - Keep it clean, dry, and intact until clinic visit         10 /18/25 1106            Follow-up Information     Medicine, [facility]. Schedule an appointment as soon as possible for a visit in 1 week(s).   Specialty: Family Medicine        Timmy Maude SAUNDERS, MD. Schedule an appointment as soon as possible for a visit in 1 week(s).   Specialty: Oncology Contact information: [address] [place] [address] [phone]                Allergies  Allergen Reactions   Naproxen Shortness Of Breath, Itching, Swelling and Other (See Comments)    Angioedema   Tizanidine  Shortness Of Breath and Other (See Comments)    Dizziness, also    Consultations:    Procedures/Studies: IR BONE MARROW BIOPSY & ASPIRATION Result Date: 10/04/2024 INDICATION: History of CLL. EXAM: FLUOROSCOPIC GUIDED BONE MARROW BIOPSY AND ASPIRATION MEDICATIONS: None FLUOROSCOPY: Radiation Exposure Index and estimated peak skin dose (PSD); Reference air kerma (RAK), 1 mGy. ANESTHESIA/SEDATION: Moderate (conscious) sedation was employed during this procedure. A total of Versed  2 mg and Fentanyl  100 mcg was administered intravenously. Moderate Sedation Time: 10 minutes. The patient's level of consciousness and vital signs were monitored continuously by radiology nursing throughout the procedure under my direct supervision. COMPLICATIONS: None immediate. PROCEDURE: Informed consent was obtained from the patient following an explanation of the procedure, risks, benefits and alternatives. The patient understands, agrees and consents for the procedure. All questions were addressed. A time out was performed prior to the initiation of the procedure. The patient was positioned prone on the fluoroscopy table and the posterior aspect of the RIGHT iliac crest was marked fluoroscopically. The operative site was prepped and draped in the usual sterile fashion. Under sterile conditions and local anesthesia, an 11 gauge coaxial bone biopsy  needle was advanced into the posterior aspect of the RIGHT iliac marrow space under intermittent fluoroscopic guidance. Multiple fluoroscopic images were saved procedural documentation purposes. Initially, a bone marrow aspiration was performed. Next, a bone marrow biopsy was obtained with the 11 gauge outer bone marrow device. Samples were prepared with the cytotechnologist and deemed adequate. The needle was removed and superficial hemostasis was obtained with manual compression. A dressing was applied. The patient tolerated the procedure well without immediate post procedural complication. IMPRESSION: Successful fluoroscopic-guided RIGHT iliac bone marrow aspiration and core biopsy. Thom Hall,  MD [facility] Electronically Signed   By: Thom Hall M.D.   On: 10/04/2024 14:26   US  CORE BIOPSY (SOFT TISSUE) Result Date: 10/03/2024 CLINICAL DATA:  Soft tissue nodule left lower extremity. EXAM: MUSCLE/SOFT TISSUE CORE BIOPSY ANESTHESIA/SEDATION: 1% lidocaine  MEDICATIONS: None PROCEDURE: None COMPLICATIONS: None immediate FINDINGS: Ultrasound imaging of the pretibial region in the left lower extremity demonstrates an irregular soft tissue nodule in the subcutaneous tissue with a hypoechoic with center and hyperechoic border with minimal vascularity. The left lower extremity was prepped and draped in usual sterile fashion. Local anesthesia was achieved by infiltrating subcutaneous tissue with 1% lidocaine . A small incision was created in the patient's skin and the 18 gauge super core biopsy needle was advanced under ultrasound guidance until the needle tip was on the proximal edge. The biopsy needle was deployed and a sample was obtained. Samples placed in formalin. A second sample was obtained in similar fashion. Post biopsy color flow images demonstrate no active hemorrhage. Sterile dressing applied. IMPRESSION: Satisfactory core needle biopsy of a  left lower extremity soft tissue nodule as described above. Electronically Signed   By: Cordella Banner   On: 10/03/2024 10:49   MR Lumbar Spine W Wo Contrast Result Date: 10/02/2024 EXAM: MRI CERVICAL, THORACIC, AND LUMBAR SPINE WITH AND WITHOUT CONTRAST 10/02/2024 12:34:56 PM TECHNIQUE: Multiplanar multisequence MRI of the cervical, thoracic, and lumbar spine was performed without and with the administration of 6 mL gadobutrol  (GADAVIST ) 1 MMOL/ML injection. COMPARISON: None available. CLINICAL HISTORY: Hematologic malignancy, monitor; History of CLL. On treatment. Now with severe back pain. MR Thoracic, Lumbar and Cervical w/wo: 6 ml Gadavist  (MULTIPLE, MULTIPLE REMINDERS TO HOLD STILL. PT. CONTINUED TO MOVE. WAS MEDICATED); Hx of CLL. Pt is here for pain management. Pt has back lower and upper back pain. FINDINGS: BONES AND ALIGNMENT: Cervical spine: Straightening of the normal cervical lordosis. No suspicious osseous lesions. Normal vertebral body heights. Marrow signal is unremarkable. No abnormal enhancement. Thoracic spine: There is edema throughout the T5 vertebral body with irregularity of the endplates and up to 80% height loss centrally compatible with acute compression fracture. There is approximately 3 mm retropulsion at the superior endplate of T5. Additional mild chronic deformity of the T3 vertebral body with mild height loss. Additional edema in the T10 through T12 vertebral bodies with irregularity of the endplates and associated height loss. There is approximately 50% height loss of the T11 vertebral body with 2 mm retropulsion at the superior endplate. Additional 55% height loss of the T12 vertebral body centrally without significant retropulsion. There is associated enhancement in the T10 through T12 vertebral bodies. For the remaining thoracic vertebral bodies (T1-T2, T4, T6-T9), vertebral body heights are normal, marrow signal is unremarkable, and there is no abnormal enhancement. Lumbar  spine: Signal abnormality in the superior aspect of the L1 vertebral body with associated edema and irregularity of the superior endplate concerning for compression fracture with minimal height loss. There is additional irregularity of the L2 superior endplate with associated edema with no more than 20% height loss. Irregularity of the L5 superior endplate with up to 20% height loss and edema and mild enhancement which may reflect additional compression fracture. No significant retropulsion in the lumbar spine. For the remaining lumbar vertebral bodies (L3, L4), vertebral body heights are normal, marrow signal is unremarkable, and there is no abnormal enhancement. Overall: Degenerative endplate changes at multiple levels. SPINAL CORD: Cervical spine: Mild flattening of the ventral cervical cord at  C5-C6 and C6-C7. Thoracic spine: Retropulsion of fracture fragments of T5 causes indentation of the thecal sac with mild flattening of the ventral cord. No definite cord signal abnormality at this level. There is mild dural enhancement along the ventral and dorsal aspects of the spinal canal from T9 to the level of T11 and T12 which may be reactive in the setting of acute fracture. There is no evidence of nodule or mass-like enhancement. Lumbar spine: The conus medullaris extends to the L1-L2 level. SOFT TISSUES: Cervical: Unremarkable. Thoracic: Unremarkable. Lumbar: Cyst in the posterior aspect of the left kidney. CERVICAL DISC LEVELS: C2-C3: Small disc bulge without significant spinal canal or foraminal stenosis. C3-C4: No significant spinal canal stenosis. Facet arthrosis slightly greater on the left. Mild left foraminal stenosis. C4-C5: Small disc bulge. Bilateral facet arthrosis. No significant spinal canal or foraminal stenosis. C5-C6: Diffuse disc bulge which indents the ventral thecal sac and likely contacts the ventral cervical cord with mild flattening of the cord. Bilateral facet arthrosis. Uncovertebral  hypertrophy on the left. Mild left foraminal stenosis. C6-C7: Diffuse disc bulge which indents the ventral thecal sac with mild flattening of the ventral cervical cord and bilateral facet arthrosis. No significant foraminal stenosis. C7-T1: No significant spinal canal or foraminal stenosis. THORACIC DISC LEVELS: T5-T6: Retropulsion of fracture fragments and facet arthrosis contributes to moderate foraminal stenosis bilaterally. T6-T7 through T8-T9: Multiple shallow disc bulges without significant spinal canal stenosis. Facet arthrosis at multiple levels. T10-T11: Additional moderate bilateral foraminal stenosis. Other thoracic levels: T1-T2, T2-T3, T3-T4, T4-T5, T9-T10, T11-T12: No significant disc herniation. No spinal canal stenosis or neural foraminal narrowing. LUMBAR DISC LEVELS: T12-L1: Mild facet arthrosis. No significant spinal canal or foraminal stenosis. L1-L2: Small disc bulge and mild facet arthrosis. No significant spinal canal or foraminal stenosis. L2-L3: Moderate disc height loss. Diffuse disc bulge. Small right subarticular disc protrusion. Mild to moderate facet arthrosis. No significant spinal canal stenosis or foraminal stenosis. L3-L4: Mild to moderate disc height loss. Diffuse disc bulge. Moderate facet arthrosis. Mild lateral recess narrowing and mild spinal canal stenosis. Bilateral foraminal disc protrusions. Mild bilateral foraminal stenosis slightly greater on the left. L4-L5: Diffuse disc bulge. Small central annular fissure. Mild lateral recess narrowing. Moderate facet arthrosis with bilateral facet effusions. Mild spinal canal stenosis. Mild foraminal stenosis on the left. L5-S1: Moderate to severe disc height loss. Small disc bulge. Mild to moderate facet arthrosis. No significant spinal canal stenosis. Mild right and moderate left foraminal stenosis. IMPRESSION: 1. Acute compression fracture of T5 with up to 80% height loss and 3 mm retropulsion, causing indentation of the thecal sac  with mild ventral cord flattening and contributing to moderate bilateral foraminal stenosis at T5-T6. 2. Acute/subacute compression fractures involving T10T12 with edema and enhancement, including approximately 50% height loss at T11 with 2 mm retropulsion and 55% height loss at T12 without significant retropulsion. 3. Additional probable subacute compression fractures at L2 and L5. Probable minimal superior endplate compression at L1. 4. Findings concerning for pathologic fractures in the setting of known hematologic malignancy. 5. Degenerative changes as above. No high-grade spinal canal stenosis. Moderate foraminal stenosis at multiple levels. Electronically signed by: Donnice Mania MD 10/02/2024 01:51 PM EDT RP Workstation: [id number]   MR THORACIC SPINE W WO CONTRAST Result Date: 10/02/2024 EXAM: MRI CERVICAL, THORACIC, AND LUMBAR SPINE WITH AND WITHOUT CONTRAST 10/02/2024 12:34:56 PM TECHNIQUE: Multiplanar multisequence MRI of the cervical, thoracic, and lumbar spine was performed without and with the administration of 6 mL gadobutrol  (GADAVIST ) 1 MMOL/ML  injection. COMPARISON: None available. CLINICAL HISTORY: Hematologic malignancy, monitor; History of CLL. On treatment. Now with severe back pain. MR Thoracic, Lumbar and Cervical w/wo: 6 ml Gadavist  (MULTIPLE, MULTIPLE REMINDERS TO HOLD STILL. PT. CONTINUED TO MOVE. WAS MEDICATED); Hx of CLL. Pt is here for pain management. Pt has back lower and upper back pain. FINDINGS: BONES AND ALIGNMENT: Cervical spine: Straightening of the normal cervical lordosis. No suspicious osseous lesions. Normal vertebral body heights. Marrow signal is unremarkable. No abnormal enhancement. Thoracic spine: There is edema throughout the T5 vertebral body with irregularity of the endplates and up to 80% height loss centrally compatible with acute compression fracture. There is approximately 3 mm retropulsion at the superior endplate of T5. Additional mild chronic deformity of  the T3 vertebral body with mild height loss. Additional edema in the T10 through T12 vertebral bodies with irregularity of the endplates and associated height loss. There is approximately 50% height loss of the T11 vertebral body with 2 mm retropulsion at the superior endplate. Additional 55% height loss of the T12 vertebral body centrally without significant retropulsion. There is associated enhancement in the T10 through T12 vertebral bodies. For the remaining thoracic vertebral bodies (T1-T2, T4, T6-T9), vertebral body heights are normal, marrow signal is unremarkable, and there is no abnormal enhancement. Lumbar spine: Signal abnormality in the superior aspect of the L1 vertebral body with associated edema and irregularity of the superior endplate concerning for compression fracture with minimal height loss. There is additional irregularity of the L2 superior endplate with associated edema with no more than 20% height loss. Irregularity of the L5 superior endplate with up to 20% height loss and edema and mild enhancement which may reflect additional compression fracture. No significant retropulsion in the lumbar spine. For the remaining lumbar vertebral bodies (L3, L4), vertebral body heights are normal, marrow signal is unremarkable, and there is no abnormal enhancement. Overall: Degenerative endplate changes at multiple levels. SPINAL CORD: Cervical spine: Mild flattening of the ventral cervical cord at C5-C6 and C6-C7. Thoracic spine: Retropulsion of fracture fragments of T5 causes indentation of the thecal sac with mild flattening of the ventral cord. No definite cord signal abnormality at this level. There is mild dural enhancement along the ventral and dorsal aspects of the spinal canal from T9 to the level of T11 and T12 which may be reactive in the setting of acute fracture. There is no evidence of nodule or mass-like enhancement. Lumbar spine: The conus medullaris extends to the L1-L2 level. SOFT  TISSUES: Cervical: Unremarkable. Thoracic: Unremarkable. Lumbar: Cyst in the posterior aspect of the left kidney. CERVICAL DISC LEVELS: C2-C3: Small disc bulge without significant spinal canal or foraminal stenosis. C3-C4: No significant spinal canal stenosis. Facet arthrosis slightly greater on the left. Mild left foraminal stenosis. C4-C5: Small disc bulge. Bilateral facet arthrosis. No significant spinal canal or foraminal stenosis. C5-C6: Diffuse disc bulge which indents the ventral thecal sac and likely contacts the ventral cervical cord with mild flattening of the cord. Bilateral facet arthrosis. Uncovertebral hypertrophy on the left. Mild left foraminal stenosis. C6-C7: Diffuse disc bulge which indents the ventral thecal sac with mild flattening of the ventral cervical cord and bilateral facet arthrosis. No significant foraminal stenosis. C7-T1: No significant spinal canal or foraminal stenosis. THORACIC DISC LEVELS: T5-T6: Retropulsion of fracture fragments and facet arthrosis contributes to moderate foraminal stenosis bilaterally. T6-T7 through T8-T9: Multiple shallow disc bulges without significant spinal canal stenosis. Facet arthrosis at multiple levels. T10-T11: Additional moderate bilateral foraminal stenosis. Other  thoracic levels: T1-T2, T2-T3, T3-T4, T4-T5, T9-T10, T11-T12: No significant disc herniation. No spinal canal stenosis or neural foraminal narrowing. LUMBAR DISC LEVELS: T12-L1: Mild facet arthrosis. No significant spinal canal or foraminal stenosis. L1-L2: Small disc bulge and mild facet arthrosis. No significant spinal canal or foraminal stenosis. L2-L3: Moderate disc height loss. Diffuse disc bulge. Small right subarticular disc protrusion. Mild to moderate facet arthrosis. No significant spinal canal stenosis or foraminal stenosis. L3-L4: Mild to moderate disc height loss. Diffuse disc bulge. Moderate facet arthrosis. Mild lateral recess narrowing and mild spinal canal stenosis.  Bilateral foraminal disc protrusions. Mild bilateral foraminal stenosis slightly greater on the left. L4-L5: Diffuse disc bulge. Small central annular fissure. Mild lateral recess narrowing. Moderate facet arthrosis with bilateral facet effusions. Mild spinal canal stenosis. Mild foraminal stenosis on the left. L5-S1: Moderate to severe disc height loss. Small disc bulge. Mild to moderate facet arthrosis. No significant spinal canal stenosis. Mild right and moderate left foraminal stenosis. IMPRESSION: 1. Acute compression fracture of T5 with up to 80% height loss and 3 mm retropulsion, causing indentation of the thecal sac with mild ventral cord flattening and contributing to moderate bilateral foraminal stenosis at T5-T6. 2. Acute/subacute compression fractures involving T10T12 with edema and enhancement, including approximately 50% height loss at T11 with 2 mm retropulsion and 55% height loss at T12 without significant retropulsion. 3. Additional probable subacute compression fractures at L2 and L5. Probable minimal superior endplate compression at L1. 4. Findings concerning for pathologic fractures in the setting of known hematologic malignancy. 5. Degenerative changes as above. No high-grade spinal canal stenosis. Moderate foraminal stenosis at multiple levels. Electronically signed by: Donnice Mania MD 10/02/2024 01:51 PM EDT RP Workstation: [id number]   MR CERVICAL SPINE W WO CONTRAST Result Date: 10/02/2024 EXAM: MRI CERVICAL, THORACIC, AND LUMBAR SPINE WITH AND WITHOUT CONTRAST 10/02/2024 12:34:56 PM TECHNIQUE: Multiplanar multisequence MRI of the cervical, thoracic, and lumbar spine was performed without and with the administration of 6 mL gadobutrol  (GADAVIST ) 1 MMOL/ML injection. COMPARISON: None available. CLINICAL HISTORY: Hematologic malignancy, monitor; History of CLL. On treatment. Now with severe back pain. MR Thoracic, Lumbar and Cervical w/wo: 6 ml Gadavist  (MULTIPLE, MULTIPLE REMINDERS TO  HOLD STILL. PT. CONTINUED TO MOVE. WAS MEDICATED); Hx of CLL. Pt is here for pain management. Pt has back lower and upper back pain. FINDINGS: BONES AND ALIGNMENT: Cervical spine: Straightening of the normal cervical lordosis. No suspicious osseous lesions. Normal vertebral body heights. Marrow signal is unremarkable. No abnormal enhancement. Thoracic spine: There is edema throughout the T5 vertebral body with irregularity of the endplates and up to 80% height loss centrally compatible with acute compression fracture. There is approximately 3 mm retropulsion at the superior endplate of T5. Additional mild chronic deformity of the T3 vertebral body with mild height loss. Additional edema in the T10 through T12 vertebral bodies with irregularity of the endplates and associated height loss. There is approximately 50% height loss of the T11 vertebral body with 2 mm retropulsion at the superior endplate. Additional 55% height loss of the T12 vertebral body centrally without significant retropulsion. There is associated enhancement in the T10 through T12 vertebral bodies. For the remaining thoracic vertebral bodies (T1-T2, T4, T6-T9), vertebral body heights are normal, marrow signal is unremarkable, and there is no abnormal enhancement. Lumbar spine: Signal abnormality in the superior aspect of the L1 vertebral body with associated edema and irregularity of the superior endplate concerning for compression fracture with minimal height loss. There is additional irregularity of  the L2 superior endplate with associated edema with no more than 20% height loss. Irregularity of the L5 superior endplate with up to 20% height loss and edema and mild enhancement which may reflect additional compression fracture. No significant retropulsion in the lumbar spine. For the remaining lumbar vertebral bodies (L3, L4), vertebral body heights are normal, marrow signal is unremarkable, and there is no abnormal enhancement. Overall:  Degenerative endplate changes at multiple levels. SPINAL CORD: Cervical spine: Mild flattening of the ventral cervical cord at C5-C6 and C6-C7. Thoracic spine: Retropulsion of fracture fragments of T5 causes indentation of the thecal sac with mild flattening of the ventral cord. No definite cord signal abnormality at this level. There is mild dural enhancement along the ventral and dorsal aspects of the spinal canal from T9 to the level of T11 and T12 which may be reactive in the setting of acute fracture. There is no evidence of nodule or mass-like enhancement. Lumbar spine: The conus medullaris extends to the L1-L2 level. SOFT TISSUES: Cervical: Unremarkable. Thoracic: Unremarkable. Lumbar: Cyst in the posterior aspect of the left kidney. CERVICAL DISC LEVELS: C2-C3: Small disc bulge without significant spinal canal or foraminal stenosis. C3-C4: No significant spinal canal stenosis. Facet arthrosis slightly greater on the left. Mild left foraminal stenosis. C4-C5: Small disc bulge. Bilateral facet arthrosis. No significant spinal canal or foraminal stenosis. C5-C6: Diffuse disc bulge which indents the ventral thecal sac and likely contacts the ventral cervical cord with mild flattening of the cord. Bilateral facet arthrosis. Uncovertebral hypertrophy on the left. Mild left foraminal stenosis. C6-C7: Diffuse disc bulge which indents the ventral thecal sac with mild flattening of the ventral cervical cord and bilateral facet arthrosis. No significant foraminal stenosis. C7-T1: No significant spinal canal or foraminal stenosis. THORACIC DISC LEVELS: T5-T6: Retropulsion of fracture fragments and facet arthrosis contributes to moderate foraminal stenosis bilaterally. T6-T7 through T8-T9: Multiple shallow disc bulges without significant spinal canal stenosis. Facet arthrosis at multiple levels. T10-T11: Additional moderate bilateral foraminal stenosis. Other thoracic levels: T1-T2, T2-T3, T3-T4, T4-T5, T9-T10, T11-T12:  No significant disc herniation. No spinal canal stenosis or neural foraminal narrowing. LUMBAR DISC LEVELS: T12-L1: Mild facet arthrosis. No significant spinal canal or foraminal stenosis. L1-L2: Small disc bulge and mild facet arthrosis. No significant spinal canal or foraminal stenosis. L2-L3: Moderate disc height loss. Diffuse disc bulge. Small right subarticular disc protrusion. Mild to moderate facet arthrosis. No significant spinal canal stenosis or foraminal stenosis. L3-L4: Mild to moderate disc height loss. Diffuse disc bulge. Moderate facet arthrosis. Mild lateral recess narrowing and mild spinal canal stenosis. Bilateral foraminal disc protrusions. Mild bilateral foraminal stenosis slightly greater on the left. L4-L5: Diffuse disc bulge. Small central annular fissure. Mild lateral recess narrowing. Moderate facet arthrosis with bilateral facet effusions. Mild spinal canal stenosis. Mild foraminal stenosis on the left. L5-S1: Moderate to severe disc height loss. Small disc bulge. Mild to moderate facet arthrosis. No significant spinal canal stenosis. Mild right and moderate left foraminal stenosis. IMPRESSION: 1. Acute compression fracture of T5 with up to 80% height loss and 3 mm retropulsion, causing indentation of the thecal sac with mild ventral cord flattening and contributing to moderate bilateral foraminal stenosis at T5-T6. 2. Acute/subacute compression fractures involving T10T12 with edema and enhancement, including approximately 50% height loss at T11 with 2 mm retropulsion and 55% height loss at T12 without significant retropulsion. 3. Additional probable subacute compression fractures at L2 and L5. Probable minimal superior endplate compression at L1. 4. Findings concerning for pathologic fractures  in the setting of known hematologic malignancy. 5. Degenerative changes as above. No high-grade spinal canal stenosis. Moderate foraminal stenosis at multiple levels. Electronically signed by:  Donnice Mania MD 10/02/2024 01:50 PM EDT RP Workstation: [id number]   CT TIBIA FIBULA LEFT W CONTRAST Result Date: 10/02/2024 EXAM: CT Left Lower Extremity, With IV Contrast, 10/02/2024 12:58:34 PM TECHNIQUE: Axial images were acquired through the left lower extremity with IV contrast. Reformatted images were reviewed. Automated exposure control, iterative reconstruction, and/or weight based adjustment of the mA/kV was utilized to reduce the radiation dose to as low as reasonably achievable. COMPARISON: None available. CLINICAL HISTORY: Subcutaneous nodules of the calf. History of chronic lymphocytic leukemia. FINDINGS: BONES AND JOINTS: Severe osteoarthritis of the knee is suspected with a small free osteochondral fragment anteriorly in the knee joint just anterior to the tibial spine. 2 chronic lag screws are present in the medial malleolus. No acute bony findings. SOFT TISSUES: The soft tissues are remarkable for a 1.5 x 1.5 x 1.8 cm soft tissue nodule in the subcutaneous tissues of the proximal calf, present on image 44, series 9. This nodule has a more solid central component measuring 0.6 x 1.5 cm and a peripheral fatty infiltrate component. Possibilities may include inflammatory lesions or subcutaneous malignancy. Consider biopsy. Additionally, there are smaller clustered nodules on images 49 through 63 of series 6 in the medial proximal calf subcutaneous tissues, with the index nodule measuring 0.6 cm in diameter on image 54, series 6. A small knee joint effusion is also present. IMPRESSION: 1. Severe osteoarthritis of the left knee with suspected small free osteochondral fragment anterior to the tibial spine. 2. Soft tissue nodule in the subcutaneous tissues of the proximal left calf (1.5 x 1.5 x 1.8 cm) with solid central component and peripheral fatty component; inflammatory lesion or subcutaneous malignancy considered. Consider biopsy. 3. Smaller clustered subcutaneous nodules in the medial proximal  left calf, index nodule 0.6 cm. 4. Small left knee joint effusion. 5. Chronic lag screws in the medial malleolus. Electronically signed by: Ryan Salvage MD 10/02/2024 01:29 PM EDT RP Workstation: [id number]   DG CHEST PORT 1 VIEW Result Date: 10/01/2024 CLINICAL DATA:  Shortness of breath, cough EXAM: PORTABLE CHEST 1 VIEW COMPARISON:  07/11/2020 FINDINGS: Linear left basilar atelectasis. Right lung clear. No effusions. Heart and mediastinal contours are within normal limits. No acute bony abnormality. IMPRESSION: Left base atelectasis. No active disease. Electronically Signed   By: Franky Crease M.D.   On: 10/01/2024 21:56      Subjective:   Discharge Exam: Vitals:   10/05/24 0550 10/05/24 1100  BP: (!) 174/70 (!) 159/84  Pulse: 82 98  Resp: 15 16  Temp: 98.1 F (36.7 C) 98.6 F (37 C)  SpO2: 95% 97%    General: Pt is alert, awake, not in acute distress Cardiovascular: rate controlled, S1/S2 + Respiratory: bilateral decreased breath sounds at bases Abdominal: Soft, NT, ND, bowel sounds + Extremities: no edema, no cyanosis    The results of significant diagnostics from this hospitalization (including imaging, microbiology, ancillary and laboratory) are listed below for reference.     Microbiology: No results found for this or any previous visit (from the past 240 hours).   Labs: BNP (last 3 results) No results for input(s): BNP in the last 8760 hours. Basic Metabolic Panel: Recent Labs  Lab 10/01/24 1552 10/03/24 0704 10/04/24 0455 10/05/24 0521  NA 139 139 139 139  K 4.2 3.8 3.8 3.5  CL 103 105 105 106  CO2 24 24 23 23   GLUCOSE 141* 113* 132* 111*  BUN 11 8 11 10   CREATININE 0.83 0.72 0.67 0.62  CALCIUM 10.2 9.4 9.5 8.8*  MG  --  2.0  --   --   PHOS  --  3.4  --   --    Liver Function Tests: Recent Labs  Lab 10/01/24 1552 10/03/24 0704 10/04/24 0455  AST 20 18 20   ALT 9 9 9   ALKPHOS 175* 156* 148*  BILITOT 0.4 0.4 0.3  PROT 6.5 6.0* 5.8*   ALBUMIN 4.1 3.8 3.8   No results for input(s): LIPASE, AMYLASE in the last 168 hours. No results for input(s): AMMONIA in the last 168 hours. CBC: Recent Labs  Lab 10/01/24 1552 10/03/24 0704 10/04/24 0455 10/05/24 0521  WBC 5.7 3.8* 4.9 4.5  NEUTROABS 3.2  --  3.6 3.0  HGB 13.3 12.6 11.6* 11.4*  HCT 41.9 40.5 37.0 35.0*  MCV 94.8 96.9 96.4 96.4  PLT 215 199 202 209   Cardiac Enzymes: No results for input(s): CKTOTAL, CKMB, CKMBINDEX, TROPONINI in the last 168 hours. BNP: Invalid input(s): POCBNP CBG: No results for input(s): GLUCAP in the last 168 hours. D-Dimer No results for input(s): DDIMER in the last 72 hours. Hgb A1c No results for input(s): HGBA1C in the last 72 hours. Lipid Profile No results for input(s): CHOL, HDL, LDLCALC, TRIG, CHOLHDL, LDLDIRECT in the last 72 hours. Thyroid  function studies No results for input(s): TSH, T4TOTAL, T3FREE, THYROIDAB in the last 72 hours.  Invalid input(s): FREET3 Anemia work up No results for input(s): VITAMINB12, FOLATE, FERRITIN, TIBC, IRON, RETICCTPCT in the last 72 hours. Urinalysis    Component Value Date/Time   COLORURINE AMBER (A) 10/01/2024 1847   APPEARANCEUR HAZY (A) 10/01/2024 1847   LABSPEC 1.036 (H) 10/01/2024 1847   PHURINE 5.0 10/01/2024 1847   GLUCOSEU NEGATIVE 10/01/2024 1847   HGBUR NEGATIVE 10/01/2024 1847   BILIRUBINUR SMALL (A) 10/01/2024 1847   KETONESUR 5 (A) 10/01/2024 1847   PROTEINUR 100 (A) 10/01/2024 1847   NITRITE NEGATIVE 10/01/2024 1847   LEUKOCYTESUR NEGATIVE 10/01/2024 1847   Sepsis Labs Recent Labs  Lab 10/01/24 1552 10/03/24 0704 10/04/24 0455 10/05/24 0521  WBC 5.7 3.8* 4.9 4.5   Microbiology No results found for this or any previous visit (from the past 240 hours).   Time coordinating discharge: 35 minutes  SIGNED:   Sabatino Williard, MD  [facility] 10/05/2024, 2:57 PM

## 2024-10-08 ENCOUNTER — Other Ambulatory Visit: Payer: Self-pay

## 2024-10-08 ENCOUNTER — Inpatient Hospital Stay: Admitting: Dietician

## 2024-10-08 LAB — SURGICAL PATHOLOGY

## 2024-10-08 NOTE — Progress Notes (Signed)
 Clinical Intervention Note  Clinical Intervention Notes: Patient reported starting Decadron  in the hospital. No DDIs identified with Brukinsa .   Clinical Intervention Outcomes: Prevention of an adverse drug event   [facility]

## 2024-10-08 NOTE — Progress Notes (Signed)
 Specialty Pharmacy Refill Coordination Note  [name] is a 78 y.o. female contacted today regarding refills of specialty medication(s) Zanubrutinib  (Brukinsa )   Patient requested Delivery   Delivery date: 10/11/24   Verified address: [address]  [facility] [place]   Medication will be filled on 10/10/24.

## 2024-10-08 NOTE — Progress Notes (Signed)
 Nutrition Assessment  Reached out to patient at preferred phone# which is daughter's #.  Reason for Assessment: MST screen for weight loss.    ASSESSMENT: Patient is 78 year old female with Leukemia, also recently hospitalized with back pain.  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  Daughter answered and relayed that patient has dementia so she handles most of the telephone calls.  She state patient is  no hungry and is living off of Special K cereal and peanut butter and banana sandwiches.   Patient lives next door to daughter and may or may not pick up phone.   Daughter relays that she will take meds and might be willing to take MVI.  No concerns with dexterity of ability to feed herself.  She doesn't tolerate ONS.  Tried calling patient but her home# voice mail was full and I couldn't leave a message.   Anthropometrics: 6# weight loss past 5 months which is not clinically significant.  Height: 61 Weight: 138# UBW: gradual decline last few years BMI: 26.07    NUTRITION DIAGNOSIS: Inadequate PO intake to meet increased nutrient needs, r/t anorexia and dementia   INTERVENTION:   Emailed daughter recipe for protein fortified milk along with suggestion to add MVI to daily meds with contact information provided.   MONITORING, EVALUATION, GOAL: weight, PO intake, Nutrition Impact Symptoms, labs Goal is weight maintenance  Next Visit: PRN at patient or provider request  Micheline Craven, RDN, LDN Registered Dietitian, [facility] Part Time Remote (Usual office hours: Tuesday-Thursday) Cell: [phone]

## 2024-10-09 ENCOUNTER — Other Ambulatory Visit: Payer: Self-pay

## 2024-10-14 ENCOUNTER — Encounter (HOSPITAL_COMMUNITY): Payer: Self-pay | Admitting: Hematology & Oncology

## 2024-10-23 ENCOUNTER — Inpatient Hospital Stay (HOSPITAL_BASED_OUTPATIENT_CLINIC_OR_DEPARTMENT_OTHER): Admitting: Hematology & Oncology

## 2024-10-23 ENCOUNTER — Encounter: Payer: Self-pay | Admitting: Hematology & Oncology

## 2024-10-23 ENCOUNTER — Inpatient Hospital Stay: Attending: Hematology & Oncology

## 2024-10-23 ENCOUNTER — Other Ambulatory Visit: Payer: Self-pay

## 2024-10-23 ENCOUNTER — Inpatient Hospital Stay

## 2024-10-23 ENCOUNTER — Inpatient Hospital Stay: Admitting: Licensed Clinical Social Worker

## 2024-10-23 VITALS — BP 177/88 | HR 115 | Resp 16

## 2024-10-23 VITALS — BP 170/75 | HR 99 | Temp 98.2°F | Resp 18 | Wt 133.0 lb

## 2024-10-23 DIAGNOSIS — M4854XA Collapsed vertebra, not elsewhere classified, thoracic region, initial encounter for fracture: Secondary | ICD-10-CM | POA: Insufficient documentation

## 2024-10-23 DIAGNOSIS — Z7982 Long term (current) use of aspirin: Secondary | ICD-10-CM | POA: Insufficient documentation

## 2024-10-23 DIAGNOSIS — M546 Pain in thoracic spine: Secondary | ICD-10-CM | POA: Diagnosis not present

## 2024-10-23 DIAGNOSIS — M549 Dorsalgia, unspecified: Secondary | ICD-10-CM | POA: Insufficient documentation

## 2024-10-23 DIAGNOSIS — Q928 Other specified trisomies and partial trisomies of autosomes: Secondary | ICD-10-CM | POA: Insufficient documentation

## 2024-10-23 DIAGNOSIS — Z86718 Personal history of other venous thrombosis and embolism: Secondary | ICD-10-CM | POA: Insufficient documentation

## 2024-10-23 DIAGNOSIS — C7951 Secondary malignant neoplasm of bone: Secondary | ICD-10-CM | POA: Diagnosis not present

## 2024-10-23 DIAGNOSIS — R5383 Other fatigue: Secondary | ICD-10-CM | POA: Insufficient documentation

## 2024-10-23 DIAGNOSIS — M1990 Unspecified osteoarthritis, unspecified site: Secondary | ICD-10-CM | POA: Insufficient documentation

## 2024-10-23 DIAGNOSIS — Z79899 Other long term (current) drug therapy: Secondary | ICD-10-CM | POA: Insufficient documentation

## 2024-10-23 DIAGNOSIS — E550 Rickets, active: Secondary | ICD-10-CM

## 2024-10-23 DIAGNOSIS — Z86711 Personal history of pulmonary embolism: Secondary | ICD-10-CM | POA: Insufficient documentation

## 2024-10-23 DIAGNOSIS — C9110 Chronic lymphocytic leukemia of B-cell type not having achieved remission: Secondary | ICD-10-CM

## 2024-10-23 DIAGNOSIS — Z7901 Long term (current) use of anticoagulants: Secondary | ICD-10-CM | POA: Diagnosis not present

## 2024-10-23 DIAGNOSIS — Z5112 Encounter for antineoplastic immunotherapy: Secondary | ICD-10-CM | POA: Diagnosis present

## 2024-10-23 DIAGNOSIS — R2242 Localized swelling, mass and lump, left lower limb: Secondary | ICD-10-CM | POA: Insufficient documentation

## 2024-10-23 DIAGNOSIS — Z886 Allergy status to analgesic agent status: Secondary | ICD-10-CM | POA: Insufficient documentation

## 2024-10-23 DIAGNOSIS — D6862 Lupus anticoagulant syndrome: Secondary | ICD-10-CM | POA: Diagnosis not present

## 2024-10-23 DIAGNOSIS — I2699 Other pulmonary embolism without acute cor pulmonale: Secondary | ICD-10-CM | POA: Diagnosis present

## 2024-10-23 LAB — CMP (CANCER CENTER ONLY)
ALT: 15 U/L (ref 0–44)
AST: 17 U/L (ref 15–41)
Albumin: 4.4 g/dL (ref 3.5–5.0)
Alkaline Phosphatase: 165 U/L — ABNORMAL HIGH (ref 38–126)
Anion gap: 13 (ref 5–15)
BUN: 17 mg/dL (ref 8–23)
CO2: 25 mmol/L (ref 22–32)
Calcium: 9.4 mg/dL (ref 8.9–10.3)
Chloride: 101 mmol/L (ref 98–111)
Creatinine: 0.87 mg/dL (ref 0.44–1.00)
GFR, Estimated: >60 mL/min (ref >60)
Glucose, Bld: 169 mg/dL — ABNORMAL HIGH (ref 70–99)
Potassium: 3.7 mmol/L (ref 3.5–5.1)
Sodium: 139 mmol/L (ref 135–145)
Total Bilirubin: 0.7 mg/dL (ref 0.0–1.2)
Total Protein: 6.9 g/dL (ref 6.5–8.1)

## 2024-10-23 LAB — CBC WITH DIFFERENTIAL (CANCER CENTER ONLY)
Abs Immature Granulocytes: 0.2 K/uL — ABNORMAL HIGH (ref 0.00–0.07)
Basophils Absolute: 0 K/uL (ref 0.0–0.1)
Basophils Relative: 0 %
Eosinophils Absolute: 0 K/uL (ref 0.0–0.5)
Eosinophils Relative: 0 %
HCT: 45 % (ref 36.0–46.0)
Hemoglobin: 14.6 g/dL (ref 12.0–15.0)
Lymphocytes Relative: 26 %
Lymphs Abs: 2.4 K/uL (ref 0.7–4.0)
MCH: 30.9 pg (ref 26.0–34.0)
MCHC: 32.4 g/dL (ref 30.0–36.0)
MCV: 95.1 fL (ref 80.0–100.0)
Metamyelocytes Relative: 1 %
Monocytes Absolute: 0.3 K/uL (ref 0.1–1.0)
Monocytes Relative: 3 %
Myelocytes: 1 %
Neutro Abs: 6.3 K/uL (ref 1.7–7.7)
Neutrophils Relative %: 69 %
Platelet Count: 323 K/uL (ref 150–400)
RBC: 4.73 MIL/uL (ref 3.87–5.11)
RDW: 14.5 % (ref 11.5–15.5)
WBC Count: 9.1 K/uL (ref 4.0–10.5)
nRBC: 0 % (ref 0.0–0.2)

## 2024-10-23 LAB — LACTATE DEHYDROGENASE: LDH: 376 U/L — ABNORMAL HIGH (ref 98–192)

## 2024-10-23 LAB — SAVE SMEAR(SSMR), FOR PROVIDER SLIDE REVIEW

## 2024-10-23 MED ORDER — SODIUM CHLORIDE 0.9 % IV SOLN: INTRAVENOUS | Status: DC

## 2024-10-23 MED ORDER — CETIRIZINE HCL 10 MG/ML IV SOLN
10.0000 mg | Freq: Once | INTRAVENOUS | Status: AC
  Administered 2024-10-23: 10 mg via INTRAVENOUS
  Filled 2024-10-23: qty 1

## 2024-10-23 MED ORDER — KETOROLAC TROMETHAMINE 15 MG/ML IJ SOLN
30.0000 mg | Freq: Once | INTRAMUSCULAR | Status: AC
  Administered 2024-10-23: 30 mg via INTRAVENOUS
  Filled 2024-10-23: qty 2

## 2024-10-23 MED ORDER — ACETAMINOPHEN 325 MG PO TABS
650.0000 mg | ORAL_TABLET | Freq: Once | ORAL | Status: AC
  Administered 2024-10-23: 650 mg via ORAL
  Filled 2024-10-23: qty 2

## 2024-10-23 MED ORDER — SODIUM CHLORIDE 0.9 % IV SOLN
1000.0000 mg | Freq: Once | INTRAVENOUS | Status: AC
  Administered 2024-10-23: 1000 mg via INTRAVENOUS
  Filled 2024-10-23: qty 40

## 2024-10-23 MED ORDER — DEXAMETHASONE SOD PHOSPHATE PF 10 MG/ML IJ SOLN
10.0000 mg | Freq: Once | INTRAMUSCULAR | Status: AC
  Administered 2024-10-23: 10 mg via INTRAVENOUS

## 2024-10-23 MED ORDER — HYDROCODONE-ACETAMINOPHEN 7.5-325 MG PO TABS: 1.0000 | ORAL_TABLET | Freq: Four times a day (QID) | ORAL | 0 refills | Status: AC | PRN

## 2024-10-23 MED ORDER — HYDROMORPHONE HCL 1 MG/ML IJ SOLN
2.0000 mg | Freq: Once | INTRAMUSCULAR | Status: AC
  Administered 2024-10-23: 2 mg via INTRAVENOUS
  Filled 2024-10-23: qty 2

## 2024-10-23 NOTE — Progress Notes (Signed)
 Hematology and Oncology Follow Up Visit  [name] [id number] 1946/11/05 77 y.o. 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 q day -- maintanence on 09/06/2018 EC ASA 81 mg po q day Gazyva /Brukinsa /Venetoclax -- start on 05/28/2024   Interim History:  [name] is here today for follow-up.  The problem that she has had is her poor back.  She was hospitalized because of severe back pain.  She actually had MRIs that were done.  Surprisingly, there was noted to be an acute compression fracture at T5.  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  Again I am not sure as to where this could be coming from.  We did do a bone marrow biopsy on her in the hospital.  The bone marrow biopsy was done on 10/04/2024. The pathology report  ([id number]) did not show any active CLL.  There is no lymphoma.  Flow cytometry was negative.  The cytogenetics was also negative.  She actually had a biopsy of a nodule on her left leg.  This also came back negative.  This was just a hematoma.  Again I have no idea what could be involved with her spine.  She is still having quite a bit of pain.  We really need to see about getting kyphoplasty at T5.  I will see if Interventional Radiology can do this for us .  Maybe at that time, then get biopsies so we can see what is going on.  I just hate that she has so many  problems with pain.  Clearly, treatment has helped her CLL.  Again I have no reason to believe that CLL is causing all of the problems in her spine.  I know that she has arthritis.  I know she has had chronic back problems.  She has gotten I think aredia in the hospital.  She is here for her last Gazyva  treatment.  Her appetite is okay.  She actually looks  a little bit better than I would have thought.  She is able to get around.  However she does have pain.  Again, I have to see if we can do a kyphoplasty at T5.  I am not sure this can be done.  Currently, I would say that her performance status is probably ECOG 2.   Medications:  Allergies as of 10/23/2024       Reactions   Naproxen Shortness Of Breath, Itching, Swelling, Other (See Comments)   Angioedema   Tizanidine  Shortness Of Breath, Other (See Comments)   Dizziness, also        Medication List        Accurate as of October 23, 2024 12:20 PM. If you have any questions, ask your nurse or doctor.          Advair Diskus 250-50 MCG/DOSE Aepb Generic drug: fluticasone -salmeterol Inhale one puff into the lungs 2 (two) times daily.   albuterol  108 (90 Base) MCG/ACT inhaler Commonly known as: ProAir  HFA Inhale 2 puffs into the lungs every 4 (four) hours as needed for wheezing or shortness of breath.   albuterol  (2.5 MG/3ML) 0.083% nebulizer solution Commonly known as: PROVENTIL  Take 3 mLs (2.5 mg total) by nebulization every 6 (six) hours as needed for wheezing or shortness of breath.   amLODipine   10 MG tablet Commonly known as: NORVASC  Take 10 mg by mouth daily.   Brukinsa  80 MG capsule Generic drug: zanubrutinib  Take 2 capsules (160 mg total) by mouth 2 (two) times daily.   cetirizine  10 MG tablet Commonly known as: ZYRTEC  Take 10 mg by mouth daily.   dexamethasone  6 MG tablet Commonly known as: DECADRON  Take 2 tablets (12 mg total) by mouth daily.   DULoxetine 60 MG capsule Commonly known as: CYMBALTA Take 60 mg by mouth daily.   famciclovir  250 MG tablet Commonly known as: FAMVIR  Take 1 tablet (250 mg total) by mouth daily.   famotidine  20 MG tablet Commonly known as: PEPCID  Take 20 mg by mouth at bedtime.   ferrous sulfate  324 MG Tbec Take 324 mg by mouth daily with breakfast.   fluticasone  50 MCG/ACT nasal spray Commonly known as:  FLONASE  Place 2 sprays into both nostrils daily.   HYDROcodone -acetaminophen  7.5-325 MG tablet Commonly known as: NORCO Take 1 tablet by mouth See admin instructions. Take 1 tablet by mouth at 7 AM & 8 PM and additional 1 tablet twice a day as needed for unresolved pain   latanoprost  0.005 % ophthalmic solution Commonly known as: XALATAN  Place 1 drop into both eyes at bedtime.   levothyroxine  50 MCG tablet Commonly known as: SYNTHROID  Take 50 mcg by mouth daily before breakfast.   meclizine  25 MG tablet Commonly known as: ANTIVERT  Take 25 mg by mouth 3 (three) times daily as needed for dizziness.   memantine 10 MG tablet Commonly known as: NAMENDA Take 10 mg by mouth in the morning and at bedtime.   methocarbamol 500 MG tablet Commonly known as: ROBAXIN Take 500 mg by mouth 2 (two) times daily as needed for muscle spasms.   montelukast  10 MG tablet Commonly known as: SINGULAIR  Take 10 mg by mouth at bedtime.   ondansetron  8 MG tablet Commonly known as: Zofran  Take 1 tablet (8 mg total) by mouth every 8 (eight) hours as needed for nausea or vomiting.   pantoprazole  40 MG tablet Commonly known as: PROTONIX  Take 40 mg by mouth at bedtime.   prochlorperazine  10 MG tablet Commonly known as: COMPAZINE  Take 1 tablet (10 mg total) by mouth every 6 (six) hours as needed for nausea or vomiting.   rOPINIRole  2 MG tablet Commonly known as: REQUIP  Take 3 mg by mouth at bedtime.   traMADol  50 MG tablet Commonly known as: ULTRAM  Take 1 tablet (50 mg total) by mouth every 6 (six) hours as needed.   traZODone 50 MG tablet Commonly known as: DESYREL Take 50 mg by mouth at bedtime.   TYLENOL  500 MG tablet Generic drug: acetaminophen  Take 1,000 mg by mouth every 8 (eight) hours as needed (for pain or headaches).   Vitamin D (Ergocalciferol) 1.25 MG (50000 UNIT) Caps capsule Commonly known as: DRISDOL Take 50,000 Units by mouth every Sunday.   Xarelto  10 MG Tabs  tablet Generic drug: rivaroxaban  TAKE ONE TABLET BY MOUTH EVERY DAY WITH SUPPER        Allergies:  Allergies  Allergen Reactions   Naproxen Shortness Of Breath, Itching, Swelling and Other (See Comments)    Angioedema   Tizanidine  Shortness Of Breath and Other (See Comments)    Dizziness, also    Past Medical History, Surgical history, Social history, and Family History were reviewed and updated.  Review of Systems: Review of Systems  Constitutional:  Positive for malaise/fatigue.  HENT: Negative.    Eyes: Negative.   Respiratory:  Negative.    Cardiovascular: Negative.   Gastrointestinal: Negative.   Genitourinary: Negative.   Musculoskeletal:  Positive for back pain.  Skin: Negative.   Neurological: Negative.   Endo/Heme/Allergies: Negative.   Psychiatric/Behavioral: Negative.       Physical Exam:  Vital signs show temperature of 97.8.  Pulse 85.  Blood pressure 151/63.  Weight is 133 pounds.     Wt Readings from Last 3 Encounters:  10/23/24 133 lb (60.3 kg)  09/24/24 138 lb (62.6 kg)  08/28/24 142 lb (64.4 kg)    Physical Exam Vitals reviewed.  HENT:     Head: Normocephalic and atraumatic.  Eyes:     Pupils: Pupils are equal, round, and reactive to light.  Cardiovascular:     Rate and Rhythm: Normal rate and regular rhythm.     Heart sounds: Normal heart sounds.  Pulmonary:     Effort: Pulmonary effort is normal.     Breath sounds: Normal breath sounds.  Abdominal:     General: Bowel sounds are normal.     Palpations: Abdomen is soft.  Musculoskeletal:        General: No tenderness or deformity. Normal range of motion.     Cervical back: Normal range of motion.     Comments: Back exam shows tenderness to the back.  She has back brace on.  She has very limited mobility.  In the left lower leg, she does have a subcutaneous nodule that is mobile.  It is nontender.  It is in the upper part of the left lower leg.  It is medial to the tibia.  It probably  measures about 2 cm.  Lymphadenopathy:     Cervical: No cervical adenopathy.  Skin:    General: Skin is warm and dry.     Findings: No erythema or rash.  Neurological:     Mental Status: She is alert and oriented to person, place, and time.  Psychiatric:        Behavior: Behavior normal.        Thought Content: Thought content normal.        Judgment: Judgment normal.      Lab Results  Component Value Date   WBC 9.1 10/23/2024   HGB 14.6 10/23/2024   HCT 45.0 10/23/2024   MCV 95.1 10/23/2024   PLT 323 10/23/2024   Lab Results  Component Value Date   FERRITIN 1,360 (H) 08/28/2024   IRON 37 08/28/2024   TIBC 266 08/28/2024   UIBC 229 08/28/2024   IRONPCTSAT 14 08/28/2024   Lab Results  Component Value Date   RETICCTPCT 2.3 05/22/2024   RBC 4.73 10/23/2024   Lab Results  Component Value Date   KPAFRELGTCHN 111.9 (H) 05/22/2024   LAMBDASER 18.5 05/22/2024   KAPLAMBRATIO 6.05 (H) 05/22/2024   Lab Results  Component Value Date   IGGSERUM 741 05/22/2024   IGA 107 05/22/2024   IGMSERUM 255 (H) 05/22/2024   No results found for: STEPHANY CARLOTA BENSON MARKEL EARLA JOANNIE DOC VICK, SPEI   Chemistry      Component Value Date/Time   NA 139 10/23/2024 0843   NA 141 11/03/2015 1117   K 3.7 10/23/2024 0843   CL 101 10/23/2024 0843   CO2 25 10/23/2024 0843   BUN 17 10/23/2024 0843   BUN 22 11/03/2015 1117   CREATININE 0.87 10/23/2024 0843      Component Value Date/Time   CALCIUM 9.4 10/23/2024 0843   ALKPHOS 165 (H) 10/23/2024 9156  AST 17 10/23/2024 0843   ALT 15 10/23/2024 0843   BILITOT 0.7 10/23/2024 0843       Impression and Plan: [name] is a very pleasant 78 yo caucasian female with history of pulmonary emboli and lower extremity DVT with transiently positive lupus anticoagulant (resolved).  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  The bone marrow biopsy was negative for any active CLL.  The  treatment has certainly helped.  Again, I have no idea as to what could be causing all the problems in her spine.  Maybe, this is not even malignant.  Again she ran out of her pain medication.  I will have to refill this.  I will see if IR can see her and see about the possibility of a T5 kyphoplasty.  I will send in her Vicodin.  We will see about getting the PET scan.  Hopefully this will be done in a week.  I guess the real key is going to be whether or not we can get a vertebroplasty and biopsy.  I will plan to see her back based on what we find with the PET scan and vertebroplasty.   Maude JONELLE Crease, MD 11/5/202512:20 PM

## 2024-10-23 NOTE — Patient Instructions (Signed)
Obinutuzumab Injection What is this medication? OBINUTUZUMAB (OH bi nue TOOZ ue mab) treats leukemia and lymphoma. It works by blocking a protein that causes cancer cells to grow and multiply. This helps to slow or stop the spread of cancer cells. It is a monoclonal antibody. This medicine may be used for other purposes; ask your health care provider or pharmacist if you have questions. COMMON BRAND NAME(S): GAZYVA What should I tell my care team before I take this medication? They need to know if you have any of these conditions: Heart disease Infection, especially a viral infection, such as hepatitis B Lung or breathing disease Take medications that treat or prevent blood clots An unusual or allergic reaction to obinutuzumab, other medications, foods, dyes, or preservatives Pregnant or trying to get pregnant Breastfeeding How should I use this medication? This medication is for infusion into a vein. It is given by a care team in a hospital or clinic setting. Talk to your care team about the use of this medication in children. Special care may be needed. Overdosage: If you think you have taken too much of this medicine contact a poison control center or emergency room at once. NOTE: This medicine is only for you. Do not share this medicine with others. What if I miss a dose? Keep appointments for follow-up doses as directed. It is important not to miss your dose. Call your care team if you are unable to keep an appointment. What may interact with this medication? Live virus vaccines This list may not describe all possible interactions. Give your health care provider a list of all the medicines, herbs, non-prescription drugs, or dietary supplements you use. Also tell them if you smoke, drink alcohol, or use illegal drugs. Some items may interact with your medicine. What should I watch for while using this medication? Report any side effects that you notice during your treatment right away,  such as changes in your breathing, fever, chills, dizziness or lightheadedness. These effects are more common with the first dose. Visit your care team for checks on your progress. You will need to have regular blood work. Report any other side effects. The side effects of this medication can continue after you finish your treatment. Continue your course of treatment even though you feel ill unless your care team tells you to stop. Call your care team for advice if you get a fever, chills or sore throat, or other symptoms of a cold or flu. Do not treat yourself. This medication decreases your body's ability to fight infections. Try to avoid being around people who are sick. This medication may increase your risk to bruise or bleed. Call your care team if you notice any unusual bleeding. Do not become pregnant while taking this medication or for 6 months after stopping it. Inform your care team if you wish to become pregnant or think you might be pregnant. There is a potential for serious side effects to an unborn child. Talk to your care team or pharmacist for more information. Do not breast-feed an infant while taking this medication or for 6 months after stopping it. What side effects may I notice from receiving this medication? Side effects that you should report to your care team as soon as possible: Allergic reactions--skin rash, itching, hives, swelling of the face, lips, tongue, or throat Bleeding--bloody or black, tar-like stools, vomiting blood or brown material that looks like coffee grounds, red or dark brown urine, small red or purple spots on skin, unusual bruising  or bleeding Blood clot--pain, swelling, or warmth in the leg, shortness of breath, chest pain Dizziness, loss of balance or coordination, confusion or trouble speaking Infection--fever, chills, cough, sore throat, wounds that don't heal, pain or trouble when passing urine, general feeling of discomfort or being unwell Infusion  reactions--chest pain, shortness of breath or trouble breathing, feeling faint or lightheaded Liver injury--right upper belly pain, loss of appetite, nausea, light-colored stool, dark yellow or brown urine, yellowing skin or eyes, unusual weakness or fatigue Tumor lysis syndrome (TLS)--nausea, vomiting, diarrhea, decrease in the amount of urine, dark urine, unusual weakness or fatigue, confusion, muscle pain or cramps, fast or irregular heartbeat, joint pain Side effects that usually do not require medical attention (report to your care team if they continue or are bothersome): Bone, joint, or muscle pain Constipation Diarrhea Fatigue Runny or stuffy nose Sore throat This list may not describe all possible side effects. Call your doctor for medical advice about side effects. You may report side effects to FDA at 1-800-FDA-1088. Where should I keep my medication? This medication is only given in a hospital or clinic and will not be stored at home. NOTE: This sheet is a summary. It may not cover all possible information. If you have questions about this medicine, talk to your doctor, pharmacist, or health care provider.  2024 Elsevier/Gold Standard (2022-04-27 00:00:00)

## 2024-10-23 NOTE — Progress Notes (Signed)
 [facility] CSW Progress Note  Visual Merchandiser met with patient to follow-up on emotional support.    Interventions: CSW assessed patient's needs.  She was just beginning her treatment today and expressed having no needs.      Follow Up Plan:  CSW will follow-up with patient by phone     Macario CHRISTELLA Au, LCSW Clinical Social Worker [facility]

## 2024-10-24 ENCOUNTER — Other Ambulatory Visit: Payer: Self-pay

## 2024-10-25 ENCOUNTER — Other Ambulatory Visit: Payer: Self-pay

## 2024-10-25 ENCOUNTER — Telehealth: Payer: Self-pay | Admitting: Pharmacist

## 2024-10-25 DIAGNOSIS — C9110 Chronic lymphocytic leukemia of B-cell type not having achieved remission: Secondary | ICD-10-CM

## 2024-10-25 MED ORDER — ZANUBRUTINIB 160 MG PO TABS
160.0000 mg | ORAL_TABLET | Freq: Two times a day (BID) | ORAL | 6 refills | Status: AC
  Filled 2024-10-25 – 2024-10-31 (×2): qty 60, 30d supply, fill #0
  Filled 2024-12-02: qty 60, 30d supply, fill #1
  Filled 2024-12-27: qty 60, 30d supply, fill #2
  Filled 2025-01-24: qty 60, 30d supply, fill #3

## 2024-10-25 NOTE — Telephone Encounter (Signed)
 Oral Chemotherapy Pharmacist Encounter   Dosage formulation change required for Brukinsa  (zanubrutinib ). Manufacturer is phasing out previous dosage forms of capsules and changing to tablet formulation. All future prescriptions sent will need to be tablet formulation due to inability to obtain capsules.    Patient's most recent prescription for Brukinsa  has been switched to tablet formulation to be dispensed form the [facility].   Asberry Macintosh, PharmD, BCPS, BCOP Hematology/Oncology Clinical Pharmacist [phone] 10/25/2024 10:04 AM

## 2024-10-29 ENCOUNTER — Telehealth: Payer: Self-pay | Admitting: *Deleted

## 2024-10-29 ENCOUNTER — Encounter (HOSPITAL_COMMUNITY)
Admission: RE | Admit: 2024-10-29 | Discharge: 2024-10-29 | Disposition: A | Discharge status: Home / Self Care | Source: Ambulatory Visit | Attending: Hematology & Oncology | Admitting: Hematology & Oncology

## 2024-10-29 DIAGNOSIS — C9110 Chronic lymphocytic leukemia of B-cell type not having achieved remission: Secondary | ICD-10-CM | POA: Insufficient documentation

## 2024-10-29 LAB — GLUCOSE, CAPILLARY: Glucose-Capillary: 127 mg/dL — ABNORMAL HIGH (ref 70–99)

## 2024-10-29 MED ORDER — FLUDEOXYGLUCOSE F - 18 (FDG) INJECTION
6.6000 | Freq: Once | INTRAVENOUS | Status: AC | PRN
  Administered 2024-10-29: 6.6 via INTRAVENOUS

## 2024-10-29 NOTE — Telephone Encounter (Signed)
 Received a call from patients daughter stating that she has not heard back about the IR kyphoplasty.  Messaged Tiffany Tommie and Orie Bers regarding this appt.  They will reach out to patient.  Sherri notified.

## 2024-10-30 ENCOUNTER — Ambulatory Visit: Payer: Self-pay | Admitting: Hematology & Oncology

## 2024-10-30 NOTE — Progress Notes (Signed)
 Chief Complaint: Patient was seen in consultation today for T5 compression fracture.   Referring Physician(s): Ennever,Peter R  History of Present Illness: [name] is a 78 y.o. female with a medical history significant for PE/DVT (Xarelto ), HTN, DM, anxiety/depression, RLS, osteoporosis, dementia and chronic lymphocytic leukemia currently undergoing treatment. She also has a history of chronic back pain which worsened several months ago. Imaging obtained in August, September and October (at [facility] and [facility] facilities) showed numerous compression fractures and possible metastatic disease. She was treated conservatively with a back brace.    She then presented to the [facility] ED 10/01/24 with severe back pain and was admitted for treatment. She was seen by Dr. Timmy at the hospital and additional imaging studies were ordered. IR was also consulted for a bone marrow biopsy and a biopsy of a left lower leg nodule. Pathology from both procedures were negative for malignancy.   MR imaging of the spine 10/02/24 showed an acute T5 compression fracture with up to an 80% height loss. Several other lumbar and thoracic compression fractures were identified although none as notable as T5. Dr. Timmy was concerned the compression fractures could be pathologic from lymphoma.   She followed up with Dr. Timmy 10/23/24 and reported continued, severe back pain. Dr. Timmy recommended kyphoplasty with possible bone biopsy and she was referred to Interventional Radiology. She presents to the clinic today for further discussion.   A PET scan was completed 10/29/24 and this was negative for residual or recurrent lymphoma. The thoracolumbar compression deformities showed low level hypermetabolic activity and are likely related to underlying osteoporosis.   She is accompanied today in clinic person by her cousin and via telephone by her daughter.  Her daughter is very involved in her health.  Her pain  started approximately in July and has gotten worse.  She takes some pain medications which minimally improve her pain.  Today she scores an 18/24 on the [employer] questionnaire.  She describes her pain as being mostly in the middle of her back radiating to the sides.  She has been using a walker since the pain started.  No new weakness in her lower extremities.  She cannot recall any recent falls or inciting events.  She has fallen some prior to the onset of pain in the past year, but suffered no injuries.  Today her pain is rated at a 5/10.   Past Medical History:  Diagnosis Date   Allergic rhinitis    Allergic rhinitis    Anxiety    Arthritis    hands, feet, shoulders, arms (02/22/2018)   Bilateral pulmonary embolism (HCC) 02/21/2018   Chronic cough    CLL (chronic lymphocytic leukemia) (HCC) 04/24/2024   Depression    Disease of thyroid  gland    Dupuytren contracture    both feet, both hands (02/22/2018)   DVT (deep venous thrombosis) (HCC)    bil le   GERD (gastroesophageal reflux disease)    Glaucoma    Hypertension    Hypothyroidism    Iron deficiency anemia due to chronic blood loss 05/23/2024   Mild persistent asthma    cougher not a wheezr gets chokes easily   Pneumonia    PONV (postoperative nausea and vomiting)     Past Surgical History:  Procedure Laterality Date   BLADDER SUSPENSION  X 2   BUNIONECTOMY     RIGHT FOOT ARCH REPAIRED AS WELL   CATARACT EXTRACTION W/ INTRAOCULAR LENS  IMPLANT, BILATERAL Bilateral  DILATION AND CURETTAGE OF UTERUS  X 3   DUPUYTREN CONTRACTURE RELEASE Bilateral    feet   FASCIECTOMY Right 01/23/2018   Procedure: SEGMENTAL FASCIECTOMY RIGHT MIDDLE FINGER;  Surgeon: Murrell Kuba, MD;  Location: [facility];  Service: Orthopedics;  Laterality: Right;  AXILLARY BLOCK   fracture surgery right tibia and ankle     IR BONE MARROW BIOPSY & ASPIRATION  10/04/2024   KNEE ARTHROSCOPY Right    REDUCTION MAMMAPLASTY  Bilateral 2018   TOTAL ABDOMINAL HYSTERECTOMY  1975   got pregnant w/IUD in then lost baby   TOTAL KNEE ARTHROPLASTY Right 06/08/2020   Procedure: TOTAL KNEE ARTHROPLASTY;  Surgeon: Rubie Kemps, MD;  Location: [facility];  Service: Orthopedics;  Laterality: Right;    Allergies: Naproxen and Tizanidine   Medications: Prior to Admission medications   Medication Sig Start Date End Date Taking? Authorizing Provider  ADVAIR DISKUS 250-50 MCG/DOSE AEPB Inhale one puff into the lungs 2 (two) times daily. 10/01/20   Gladis Elsie BROCKS, PA-C  albuterol  (PROAIR  HFA) 108 (90 BASE) MCG/ACT inhaler Inhale 2 puffs into the lungs every 4 (four) hours as needed for wheezing or shortness of breath. 11/20/14   Neysa Rama D, MD  albuterol  (PROVENTIL ) (2.5 MG/3ML) 0.083% nebulizer solution Take 3 mLs (2.5 mg total) by nebulization every 6 (six) hours as needed for wheezing or shortness of breath. 11/26/14   Neysa Rama BIRCH, MD  amLODipine  (NORVASC ) 10 MG tablet Take 10 mg by mouth daily.     [provider]  cetirizine  (ZYRTEC ) 10 MG tablet Take 10 mg by mouth daily.    [provider]  dexamethasone  (DECADRON ) 6 MG tablet Take 2 tablets (12 mg total) by mouth daily. 10/06/24   Sigdel, Santosh, MD  DULoxetine (CYMBALTA) 60 MG capsule Take 60 mg by mouth daily.    [provider]  famciclovir  (FAMVIR ) 250 MG tablet Take 1 tablet (250 mg total) by mouth daily. 05/22/24   Timmy Maude SAUNDERS, MD  famotidine  (PEPCID ) 20 MG tablet Take 20 mg by mouth at bedtime. 07/17/24   [provider]  ferrous sulfate  324 MG TBEC Take 324 mg by mouth daily with breakfast.    [provider]  fluticasone  (FLONASE ) 50 MCG/ACT nasal spray Place 2 sprays into both nostrils daily. 10/22/21   Gladis Mary-Margaret, FNP  HYDROcodone -acetaminophen  (NORCO) 7.5-325 MG tablet Take 1 tablet by mouth every 6 (six) hours as needed for moderate pain (pain score 4-6). Take 1 tablet by mouth at 7 AM & 8 PM  and additional 1 tablet twice a day as needed for unresolved pain 10/23/24   Timmy Maude SAUNDERS, MD  latanoprost  (XALATAN ) 0.005 % ophthalmic solution Place 1 drop into both eyes at bedtime.  08/18/15   [provider]  levothyroxine  (SYNTHROID ) 50 MCG tablet Take 50 mcg by mouth daily before breakfast. 11/02/20   [provider]  meclizine  (ANTIVERT ) 25 MG tablet Take 25 mg by mouth 3 (three) times daily as needed for dizziness. 09/14/20   [provider]  memantine (NAMENDA) 10 MG tablet Take 10 mg by mouth in the morning and at bedtime. 05/01/24   [provider]  methocarbamol (ROBAXIN) 500 MG tablet Take 500 mg by mouth 2 (two) times daily as needed for muscle spasms.    [provider]  montelukast  (SINGULAIR ) 10 MG tablet Take 10 mg by mouth at bedtime.    [provider]  ondansetron  (ZOFRAN ) 8 MG tablet Take 1 tablet (8  mg total) by mouth every 8 (eight) hours as needed for nausea or vomiting. 05/27/24   Timmy Maude SAUNDERS, MD  pantoprazole  (PROTONIX ) 40 MG tablet Take 40 mg by mouth at bedtime. 08/29/15   [provider]  prochlorperazine  (COMPAZINE ) 10 MG tablet Take 1 tablet (10 mg total) by mouth every 6 (six) hours as needed for nausea or vomiting. 05/27/24   Timmy Maude SAUNDERS, MD  rOPINIRole  (REQUIP ) 2 MG tablet Take 3 mg by mouth at bedtime. 11/10/14   [provider]  traZODone (DESYREL) 50 MG tablet Take 50 mg by mouth at bedtime. 04/04/24 10/23/24  [provider]  TYLENOL  500 MG tablet Take 1,000 mg by mouth every 8 (eight) hours as needed (for pain or headaches).    [provider]  Vitamin D, Ergocalciferol, (DRISDOL) 1.25 MG (50000 UNIT) CAPS capsule Take 50,000 Units by mouth every Sunday. 05/01/24   [provider]  XARELTO  10 MG TABS tablet TAKE ONE TABLET BY MOUTH EVERY DAY WITH SUPPER 09/26/24   Timmy Maude SAUNDERS, MD  zanubrutinib  (BRUKINSA ) 160 MG tablet Take 1 tablet (160 mg total) by mouth 2  (two) times daily. 10/25/24   Timmy Maude SAUNDERS, MD     Family History  Problem Relation Age of Onset   Hypertension Mother    Arthritis Mother    Breast cancer Mother    Colon cancer Mother    Colon polyps Mother    Diabetes Father    Pneumonia Father    Heart attack Father    Heart disease Father    Dementia Father    Diabetes Brother    Breast cancer Maternal Aunt    Sleep apnea Neg Hx     Social History   Socioeconomic History   Marital status: Widowed    Spouse name: Not on file   Number of children: 2   Years of education: 53   Highest education level: Not on file  Occupational History   Occupation: Retired  Tobacco Use   Smoking status: Never   Smokeless tobacco: Never  Vaping Use   Vaping status: Never Used  Substance and Sexual Activity   Alcohol use: Not Currently    Comment: 02/22/2018 might have a drink q couple years   Drug use: No   Sexual activity: Not Currently  Other Topics Concern   Not on file  Social History Narrative   ** Merged History Encounter **       Lives at home with her brother. Right-handed. 3-4 cups coffee per day.   Social Drivers of Corporate Investment Banker Strain: Low Risk  (08/02/2024)   Received from [facility]   Overall Financial Resource Strain (CARDIA)    How hard is it for you to pay for the very basics like food, housing, medical care, and heating?: Not hard at all  Food Insecurity: No Food Insecurity (10/02/2024)   Hunger Vital Sign    Worried About Running Out of Food in the Last Year: Never true    Ran Out of Food in the Last Year: Never true  Transportation Needs: No Transportation Needs (10/02/2024)   PRAPARE - Administrator, Civil Service (Medical): No    Lack of Transportation (Non-Medical): No  Physical Activity: Inactive (08/02/2024)   Received from [facility]   Exercise Vital Sign    On average, how many days per week do you engage in moderate to strenuous exercise (like a brisk  walk)?: 0 days  Minutes of Exercise per Session: Not on file  Stress: Stress Concern Present (08/02/2024)   Received from [facility] - Occupational Stress Questionnaire    Do you feel stress - tense, restless, nervous, or anxious, or unable to sleep at night because your mind is troubled all the time - these days?: To some extent  Social Connections: Socially Isolated (10/02/2024)   Social Connection and Isolation Panel    Frequency of Communication with Friends and Family: Three times a week    Frequency of Social Gatherings with Friends and Family: Never    Attends Religious Services: Never    Database Administrator or Organizations: No    Attends Banker Meetings: Never    Marital Status: Widowed    Review of Systems: A 12 point ROS discussed and pertinent positives are indicated in the HPI above.  All other systems are negative.  Vital Signs: There were no vitals taken for this visit.  Physical Exam Constitutional:      General: She is not in acute distress. HENT:     Head: Normocephalic.     Mouth/Throat:     Mouth: Mucous membranes are moist.  Eyes:     General: No scleral icterus. Cardiovascular:     Rate and Rhythm: Normal rate and regular rhythm.  Pulmonary:     Effort: No respiratory distress.  Abdominal:     General: There is no distension.  Musculoskeletal:       Back:     Comments: Tender to percussive palpation (red).  Negative for tenderness (green).  Skin:    General: Skin is warm and dry.  Neurological:     Mental Status: She is alert and oriented to person, place, and time.     Imaging:  MR Thoracic Spine 10/02/24   Subacute compression fractures with height loss at T5, T11, T12, L1, L2, and L5.  Mild retropulsion at T5.    Labs:  CBC: Recent Labs    10/03/24 0704 10/04/24 0455 10/05/24 0521 10/23/24 0843  WBC 3.8* 4.9 4.5 9.1  HGB 12.6 11.6* 11.4* 14.6  HCT 40.5 37.0 35.0*  45.0  PLT 199 202 209 323    COAGS: No results for input(s): INR, APTT in the last 8760 hours.  BMP: Recent Labs    10/03/24 0704 10/04/24 0455 10/05/24 0521 10/23/24 0843  NA 139 139 139 139  K 3.8 3.8 3.5 3.7  CL 105 105 106 101  CO2 24 23 23 25   GLUCOSE 113* 132* 111* 169*  BUN 8 11 10 17   CALCIUM 9.4 9.5 8.8* 9.4  CREATININE 0.72 0.67 0.62 0.87  GFRNONAA >60 >60 >60 >60    LIVER FUNCTION TESTS: Recent Labs    10/01/24 1552 10/03/24 0704 10/04/24 0455 10/23/24 0843  BILITOT 0.4 0.4 0.3 0.7  AST 20 18 20 17   ALT 9 9 9 15   ALKPHOS 175* 156* 148* 165*  PROT 6.5 6.0* 5.8* 6.9  ALBUMIN 4.1 3.8 3.8 4.4    TUMOR MARKERS: No results for input(s): AFPTM, CEA, CA199, CHROMGRNA in the last 8760 hours.  Assessment and Plan:  78 year old female with a history of osteoporosis and multiple thoracolumbar compression fractures including a T5 compression fracture.   Assessment & Plan:   Patient has suffered acute osteoporotic fracture of the T5, T11, T12, L1, L2, and L5 vertebrae.  She is not tender to percussive palpation at T5.  Her pain is in the region  of the thoracolumbar junction.     History and exam have demonstrated the following:  Acute/Subacute fracture by imaging dated 10/02/24, Pain on exam concordant with level of fracture, Failure of conservative therapy and pain refractory to narcotic pain mediation, Inability to tolerate narcotic pain medication due to side effects, and Significant disability on the [employer] Disability Questionnaire with 18/24 positive symptoms, reflecting significant impact/impairment of (ADLs)   ICD-10-CM Codes that Support Medical Necessity (welshblog.at.aspx?articleId=[email or web address])  M80.08XA    Age-related osteoporosis with current pathological fracture, vertebra(e), initial encounter for fracture   Plan:  After a long discussion, we decided to treat the 3 most severe fractures  that are concordant with pain initially, which are T11, T12, and L1.  If she as persistent mid-low back pain after treatment, we could then pursue treatment of L2 and L5. We will not treat T5 due to lack of symptoms. T11, T12, and L1 vertebral body augmentation with balloon kyphoplasty, with biopsy at each level Post-procedure disposition: outpatient - [facility] Medication holds: Xarelto  - 48 hrs The patient has suffered a fracture of multiple vertebral bodies. It is recommended that patients aged 15 years or older be evaluated for possible testing or treatment of osteoporosis. A copy of this consult report is sent to the patient's referring physician.  Advanced Care Plan: The patient has an Advanced Care Plan or Surrogate Decision Maker documented in the EMR     Ester Sides, MD Pager: [phone]    I spent a total of  40 Minutes   in face to face in clinical consultation, greater than 50% of which was counseling/coordinating care for thoracolumbar compression fractures.

## 2024-10-31 ENCOUNTER — Other Ambulatory Visit: Payer: Self-pay

## 2024-10-31 ENCOUNTER — Other Ambulatory Visit (HOSPITAL_COMMUNITY): Payer: Self-pay

## 2024-11-01 ENCOUNTER — Ambulatory Visit
Admission: RE | Admit: 2024-11-01 | Discharge: 2024-11-01 | Disposition: A | Discharge status: Home / Self Care | Source: Ambulatory Visit | Attending: Hematology & Oncology | Admitting: Hematology & Oncology

## 2024-11-01 ENCOUNTER — Other Ambulatory Visit: Payer: Self-pay

## 2024-11-01 ENCOUNTER — Encounter: Payer: Self-pay | Admitting: Hematology & Oncology

## 2024-11-01 DIAGNOSIS — M546 Pain in thoracic spine: Secondary | ICD-10-CM

## 2024-11-01 HISTORY — PX: IR RADIOLOGIST EVAL & MGMT: IMG5224

## 2024-11-01 NOTE — Progress Notes (Signed)
 Specialty Pharmacy Refill Coordination Note  [name] is a 78 y.o. female contacted today regarding refills of specialty medication(s) Zanubrutinib  (BRUKINSA )   Patient requested Delivery   Delivery date: 11/12/24   Verified address: [address]  [facility] [place]   Medication will be filled on: 11/11/24   Spoke with patient's daughter. Notified of Brukinsa  formulation change to 160mg  tabs.

## 2024-11-04 ENCOUNTER — Other Ambulatory Visit: Payer: Self-pay | Admitting: Interventional Radiology

## 2024-11-04 DIAGNOSIS — M8008XA Age-related osteoporosis with current pathological fracture, vertebra(e), initial encounter for fracture: Secondary | ICD-10-CM

## 2024-11-11 ENCOUNTER — Other Ambulatory Visit: Payer: Self-pay

## 2024-11-18 ENCOUNTER — Other Ambulatory Visit: Payer: Self-pay

## 2024-11-18 NOTE — Discharge Instructions (Signed)
 Kyphoplasty Post Procedure Discharge Instructions  May resume a regular diet and any medications that you routinely take (including pain medications). However, if you are taking Aspirin  or an anticoagulant/blood thinner you will be told when you can resume taking these by the healthcare provider. No driving day of procedure. The day of your procedure take it easy. You may use an ice pack as needed to injection sites on back.  Ice to back 30 minutes on and 30 minutes off, as needed. May remove bandaids tomorrow after taking a shower. Replace daily with a clean bandaid until healed.  Do not lift anything heavier than a milk jug for 1-2 weeks or determined by your physician.  Follow up with your physician in 2 weeks.  If you need to speak to someone after hours, please call the on call IR physician at [phone].  Tell them you are a patient of Dr. Karalee and that you had a Kyphoplasty today and the issues you are having.   Please contact our office at [phone] for the following symptoms or if you have any questions:  Fever greater than 100 degrees Increased swelling, pain, or redness at injection site. Increased back and/or leg pain New numbness or change in symptoms from before the procedure.    Thank you for visiting [facility]!

## 2024-11-19 ENCOUNTER — Telehealth: Payer: Self-pay

## 2024-11-19 NOTE — Progress Notes (Signed)
 See telephone note  Need to confirm Xarelto  being held for 48 hours and allergies

## 2024-11-19 NOTE — Progress Notes (Signed)
 See telephone note  Pt's daughter, Maeola, states they are holding Xarelto  this Wednesday and Thursday evening, also confirmed Naproxen allergy , will speak with Dr. Jennefer on Friday concerning giving pt IV Toradol 

## 2024-11-20 ENCOUNTER — Other Ambulatory Visit: Payer: Self-pay

## 2024-11-20 ENCOUNTER — Inpatient Hospital Stay: Admitting: Hematology & Oncology

## 2024-11-20 ENCOUNTER — Inpatient Hospital Stay: Attending: Hematology & Oncology

## 2024-11-20 VITALS — BP 155/74 | HR 96 | Temp 97.7°F | Resp 18 | Wt 130.8 lb

## 2024-11-20 DIAGNOSIS — E550 Rickets, active: Secondary | ICD-10-CM | POA: Diagnosis not present

## 2024-11-20 DIAGNOSIS — M549 Dorsalgia, unspecified: Secondary | ICD-10-CM | POA: Diagnosis not present

## 2024-11-20 DIAGNOSIS — E039 Hypothyroidism, unspecified: Secondary | ICD-10-CM | POA: Diagnosis not present

## 2024-11-20 DIAGNOSIS — M546 Pain in thoracic spine: Secondary | ICD-10-CM

## 2024-11-20 DIAGNOSIS — Q928 Other specified trisomies and partial trisomies of autosomes: Secondary | ICD-10-CM | POA: Insufficient documentation

## 2024-11-20 DIAGNOSIS — Z886 Allergy status to analgesic agent status: Secondary | ICD-10-CM | POA: Diagnosis not present

## 2024-11-20 DIAGNOSIS — Z86711 Personal history of pulmonary embolism: Secondary | ICD-10-CM | POA: Insufficient documentation

## 2024-11-20 DIAGNOSIS — D500 Iron deficiency anemia secondary to blood loss (chronic): Secondary | ICD-10-CM

## 2024-11-20 DIAGNOSIS — M80061K Age-related osteoporosis with current pathological fracture, right lower leg, subsequent encounter for fracture with nonunion: Secondary | ICD-10-CM

## 2024-11-20 DIAGNOSIS — F0390 Unspecified dementia without behavioral disturbance: Secondary | ICD-10-CM | POA: Insufficient documentation

## 2024-11-20 DIAGNOSIS — I2699 Other pulmonary embolism without acute cor pulmonale: Secondary | ICD-10-CM | POA: Diagnosis present

## 2024-11-20 DIAGNOSIS — Z79899 Other long term (current) drug therapy: Secondary | ICD-10-CM | POA: Diagnosis not present

## 2024-11-20 DIAGNOSIS — Z7901 Long term (current) use of anticoagulants: Secondary | ICD-10-CM | POA: Insufficient documentation

## 2024-11-20 DIAGNOSIS — Z86718 Personal history of other venous thrombosis and embolism: Secondary | ICD-10-CM | POA: Diagnosis not present

## 2024-11-20 DIAGNOSIS — C9110 Chronic lymphocytic leukemia of B-cell type not having achieved remission: Secondary | ICD-10-CM

## 2024-11-20 DIAGNOSIS — Z7982 Long term (current) use of aspirin: Secondary | ICD-10-CM | POA: Insufficient documentation

## 2024-11-20 DIAGNOSIS — R5383 Other fatigue: Secondary | ICD-10-CM | POA: Insufficient documentation

## 2024-11-20 DIAGNOSIS — D6862 Lupus anticoagulant syndrome: Secondary | ICD-10-CM | POA: Diagnosis not present

## 2024-11-20 LAB — FERRITIN: Ferritin: 859 ng/mL — ABNORMAL HIGH (ref 11–307)

## 2024-11-20 LAB — IRON AND IRON BINDING CAPACITY (CC-WL,HP ONLY)
Iron: 43 ug/dL (ref 28–170)
Saturation Ratios: 15 % (ref 10.4–31.8)
TIBC: 287 ug/dL (ref 250–450)
UIBC: 244 ug/dL

## 2024-11-20 LAB — CBC WITH DIFFERENTIAL (CANCER CENTER ONLY)
Abs Immature Granulocytes: 0.16 K/uL — ABNORMAL HIGH (ref 0.00–0.07)
Basophils Absolute: 0.1 K/uL (ref 0.0–0.1)
Basophils Relative: 2 %
Eosinophils Absolute: 0.1 K/uL (ref 0.0–0.5)
Eosinophils Relative: 2 %
HCT: 42.2 % (ref 36.0–46.0)
Hemoglobin: 13.5 g/dL (ref 12.0–15.0)
Immature Granulocytes: 2 %
Lymphocytes Relative: 25 %
Lymphs Abs: 1.9 K/uL (ref 0.7–4.0)
MCH: 29.9 pg (ref 26.0–34.0)
MCHC: 32 g/dL (ref 30.0–36.0)
MCV: 93.6 fL (ref 80.0–100.0)
Monocytes Absolute: 0.6 K/uL (ref 0.1–1.0)
Monocytes Relative: 8 %
Neutro Abs: 4.7 K/uL (ref 1.7–7.7)
Neutrophils Relative %: 61 %
Platelet Count: 323 K/uL (ref 150–400)
RBC: 4.51 MIL/uL (ref 3.87–5.11)
RDW: 14.6 % (ref 11.5–15.5)
WBC Count: 7.7 K/uL (ref 4.0–10.5)
nRBC: 0 % (ref 0.0–0.2)

## 2024-11-20 LAB — CMP (CANCER CENTER ONLY)
ALT: 10 U/L (ref 0–44)
AST: 21 U/L (ref 15–41)
Albumin: 4.1 g/dL (ref 3.5–5.0)
Alkaline Phosphatase: 124 U/L (ref 38–126)
Anion gap: 10 (ref 5–15)
BUN: 11 mg/dL (ref 8–23)
CO2: 25 mmol/L (ref 22–32)
Calcium: 9.7 mg/dL (ref 8.9–10.3)
Chloride: 104 mmol/L (ref 98–111)
Creatinine: 0.82 mg/dL (ref 0.44–1.00)
GFR, Estimated: >60 mL/min (ref >60)
Glucose, Bld: 131 mg/dL — ABNORMAL HIGH (ref 70–99)
Potassium: 4.7 mmol/L (ref 3.5–5.1)
Sodium: 140 mmol/L (ref 135–145)
Total Bilirubin: 0.5 mg/dL (ref 0.0–1.2)
Total Protein: 6.4 g/dL — ABNORMAL LOW (ref 6.5–8.1)

## 2024-11-20 LAB — MAGNESIUM: Magnesium: 2.1 mg/dL (ref 1.7–2.4)

## 2024-11-20 LAB — LACTATE DEHYDROGENASE: LDH: 341 U/L — ABNORMAL HIGH (ref 105–235)

## 2024-11-20 LAB — SAVE SMEAR(SSMR), FOR PROVIDER SLIDE REVIEW

## 2024-11-20 LAB — VITAMIN D 25 HYDROXY (VIT D DEFICIENCY, FRACTURES): Vit D, 25-Hydroxy: 122.33 ng/mL — ABNORMAL HIGH (ref 30–100)

## 2024-11-20 NOTE — Progress Notes (Signed)
 Hematology and Oncology Follow Up Visit  [name] [id number] [birth year] 78 y.o. 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 q day -- maintanence on 09/06/2018 EC ASA 81 mg po q day Gazyva /Brukinsa /Venetoclax -- start on 05/28/2024 -currently taking Brukinsa .   Interim History:  Ms. [name] is here today for follow-up.  She is still bothered by her back.  She has quite a few compression fractures in her back.  Thankfully, there is absolutely nothing that is malignant back there.  We have done a very extensive array of studies.  She actually had a bone marrow biopsy that was done.  The bone marrow biopsy did not show any evidence of CLL.  She had a PET scan that was done.  The PET scan was done on 10/29/2024.  The PET scan did not show any evidence of malignant activity in the spine.  Again, I feel confident that we are not dealing with any kind of malignancy.  Hopefully, the vertebroplasty that she will have we will help her out..  I do think she has to go to a Endocrinologist to see about the osteoporosis.  We will see about making a referral.  She is on Brukinsa  right now.  She is doing well with the Brukinsa .  She has had no flareups of her COPD.  Her appetite is doing okay.  She does have an element of dementia.  She is on Namenda  for this.  Overall, I would have to say that her performance status is probably ECOG 2.    Medications:  Allergies as of 11/20/2024       Reactions   Naproxen Shortness Of Breath, Itching, Swelling, Other (See Comments)   Angioedema   Tizanidine  Shortness Of Breath, Other (See Comments)   Dizziness, also        Medication List        Accurate as of November 20, 2024  2:18 PM. If you have any questions, ask your  nurse or doctor.          STOP taking these medications    cetirizine  10 MG tablet Commonly known as: ZYRTEC  Stopped by: Maude JONELLE Crease   methocarbamol 500 MG tablet Commonly known as: ROBAXIN Stopped by: Emmons Toth R Steve Gregg   ondansetron  8 MG tablet Commonly known as: Zofran  Stopped by: Nalaysia Manganiello R Jordynn Marcella   prochlorperazine  10 MG tablet Commonly known as: COMPAZINE  Stopped by: Maude JONELLE Crease       TAKE these medications    Advair Diskus 250-50 MCG/DOSE Aepb Generic drug: fluticasone -salmeterol Inhale one puff into the lungs 2 (two) times daily.   albuterol  (2.5 MG/3ML) 0.083% nebulizer solution Commonly known as: PROVENTIL  Take 3 mLs (2.5 mg total) by nebulization every 6 (six) hours as needed for wheezing or shortness of breath. What changed: Another medication with the same name was removed. Continue taking this medication, and follow the directions you see here. Changed by: Maude JONELLE Crease   amLODipine  10 MG tablet Commonly known as: NORVASC  Take 10 mg by mouth daily.   Brukinsa  160 MG tablet Generic drug: zanubrutinib  Take 1 tablet (160 mg total) by mouth 2 (two) times daily.   dexamethasone  6 MG tablet Commonly known as: DECADRON  Take 2 tablets (12 mg total) by mouth daily.   DULoxetine  60 MG capsule  Commonly known as: CYMBALTA  Take 60 mg by mouth daily.   famciclovir  250 MG tablet Commonly known as: FAMVIR  Take 1 tablet (250 mg total) by mouth daily.   famotidine  20 MG tablet Commonly known as: PEPCID  Take 20 mg by mouth at bedtime.   ferrous sulfate  324 MG Tbec Take 324 mg by mouth daily with breakfast.   fluticasone  50 MCG/ACT nasal spray Commonly known as: FLONASE  Place 2 sprays into both nostrils daily.   HYDROcodone -acetaminophen  7.5-325 MG tablet Commonly known as: NORCO Take 1 tablet by mouth every 6 (six) hours as needed for moderate pain (pain score 4-6). Take 1 tablet by mouth at 7 AM & 8 PM and additional 1 tablet twice a day as  needed for unresolved pain   latanoprost  0.005 % ophthalmic solution Commonly known as: XALATAN  Place 1 drop into both eyes at bedtime.   levothyroxine  50 MCG tablet Commonly known as: SYNTHROID  Take 50 mcg by mouth daily before breakfast.   meclizine  25 MG tablet Commonly known as: ANTIVERT  Take 25 mg by mouth 3 (three) times daily as needed for dizziness.   memantine  10 MG tablet Commonly known as: NAMENDA  Take 10 mg by mouth in the morning and at bedtime.   montelukast  10 MG tablet Commonly known as: SINGULAIR  Take 10 mg by mouth at bedtime.   pantoprazole  40 MG tablet Commonly known as: PROTONIX  Take 40 mg by mouth at bedtime.   rOPINIRole  2 MG tablet Commonly known as: REQUIP  Take 3 mg by mouth at bedtime.   traZODone  50 MG tablet Commonly known as: DESYREL  Take 50 mg by mouth at bedtime.   TYLENOL  500 MG tablet Generic drug: acetaminophen  Take 1,000 mg by mouth every 8 (eight) hours as needed (for pain or headaches).   Vitamin D (Ergocalciferol) 1.25 MG (50000 UNIT) Caps capsule Commonly known as: DRISDOL Take 50,000 Units by mouth every Sunday.   Xarelto  10 MG Tabs tablet Generic drug: rivaroxaban  TAKE ONE TABLET BY MOUTH EVERY DAY WITH SUPPER        Allergies:  Allergies  Allergen Reactions   Naproxen Shortness Of Breath, Itching, Swelling and Other (See Comments)    Angioedema   Tizanidine  Shortness Of Breath and Other (See Comments)    Dizziness, also    Past Medical History, Surgical history, Social history, and Family History were reviewed and updated.  Review of Systems: Review of Systems  Constitutional:  Positive for malaise/fatigue.  HENT: Negative.    Eyes: Negative.   Respiratory: Negative.    Cardiovascular: Negative.   Gastrointestinal: Negative.   Genitourinary: Negative.   Musculoskeletal:  Positive for back pain.  Skin: Negative.   Neurological: Negative.   Endo/Heme/Allergies: Negative.   Psychiatric/Behavioral:  Negative.       Physical Exam:  Vital signs show temperature of 97.7.  Pulse 96.  Blood pressure 155/74.  Weight is 130 pounds.  .     Wt Readings from Last 3 Encounters:  11/20/24 130 lb 12.8 oz (59.3 kg)  10/23/24 133 lb (60.3 kg)  09/24/24 138 lb (62.6 kg)    Physical Exam Vitals reviewed.  HENT:     Head: Normocephalic and atraumatic.  Eyes:     Pupils: Pupils are equal, round, and reactive to light.  Cardiovascular:     Rate and Rhythm: Normal rate and regular rhythm.     Heart sounds: Normal heart sounds.  Pulmonary:     Effort: Pulmonary effort is normal.     Breath sounds: Normal breath  sounds.  Abdominal:     General: Bowel sounds are normal.     Palpations: Abdomen is soft.  Musculoskeletal:        General: No tenderness or deformity. Normal range of motion.     Cervical back: Normal range of motion.     Comments: Back exam shows tenderness to the back.  She has back brace on.  She has very limited mobility.  In the left lower leg, she does have a subcutaneous nodule that is mobile.  It is nontender.  It is in the upper part of the left lower leg.  It is medial to the tibia.  It probably measures about 2 cm.  Lymphadenopathy:     Cervical: No cervical adenopathy.  Skin:    General: Skin is warm and dry.     Findings: No erythema or rash.  Neurological:     Mental Status: She is alert and oriented to person, place, and time.  Psychiatric:        Behavior: Behavior normal.        Thought Content: Thought content normal.        Judgment: Judgment normal.      Lab Results  Component Value Date   WBC 7.7 11/20/2024   HGB 13.5 11/20/2024   HCT 42.2 11/20/2024   MCV 93.6 11/20/2024   PLT 323 11/20/2024   Lab Results  Component Value Date   FERRITIN 1,360 (H) 08/28/2024   IRON 37 08/28/2024   TIBC 266 08/28/2024   UIBC 229 08/28/2024   IRONPCTSAT 14 08/28/2024   Lab Results  Component Value Date   RETICCTPCT 2.3 05/22/2024   RBC 4.51 11/20/2024    Lab Results  Component Value Date   KPAFRELGTCHN 111.9 (H) 05/22/2024   LAMBDASER 18.5 05/22/2024   KAPLAMBRATIO 6.05 (H) 05/22/2024   Lab Results  Component Value Date   IGGSERUM 741 05/22/2024   IGA 107 05/22/2024   IGMSERUM 255 (H) 05/22/2024   No results found for: STEPHANY CARLOTA BENSON MARKEL EARLA JOANNIE DOC VICK, SPEI   Chemistry      Component Value Date/Time   NA 139 10/23/2024 0843   NA 141 11/03/2015 1117   K 3.7 10/23/2024 0843   CL 101 10/23/2024 0843   CO2 25 10/23/2024 0843   BUN 17 10/23/2024 0843   BUN 22 11/03/2015 1117   CREATININE 0.87 10/23/2024 0843      Component Value Date/Time   CALCIUM 9.4 10/23/2024 0843   ALKPHOS 165 (H) 10/23/2024 0843   AST 17 10/23/2024 0843   ALT 15 10/23/2024 0843   BILITOT 0.7 10/23/2024 0843       Impression and Plan: Ms. Reily is a very pleasant 78 yo caucasian female with history of pulmonary emboli and lower extremity DVT with transiently positive lupus anticoagulant (resolved).  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  The bone marrow biopsy was negative for any active CLL.  The treatment has certainly helped.  Again, the back is her main issue right now.  Hopefully, the vertebroplasties will help.  Hopefully, we will see if Endocrinology might be able to help.  She will continue on the Brukinsa  for right now.  I will plan to get her back to see me probably in about 6 or 7 weeks.  Hopefully, by then, she will be feeling better with less back discomfort.   Maude JONELLE Crease, MD 12/3/20252:18 PM

## 2024-11-21 ENCOUNTER — Other Ambulatory Visit: Payer: Self-pay

## 2024-11-21 ENCOUNTER — Other Ambulatory Visit: Payer: Self-pay | Admitting: Interventional Radiology

## 2024-11-21 ENCOUNTER — Encounter: Payer: Self-pay | Admitting: Hematology & Oncology

## 2024-11-21 DIAGNOSIS — M8008XA Age-related osteoporosis with current pathological fracture, vertebra(e), initial encounter for fracture: Secondary | ICD-10-CM

## 2024-11-21 NOTE — Telephone Encounter (Signed)
-----   Message from Maude JONELLE Crease sent at 11/21/2024  6:28 AM EST ----- Call her daughter and let her know that her mom's iron is borderline.  I think if we gave her some IV iron, this might actually help her bones a little bit.  See if we can give her some Monoferric.   Thanks.  Jeralyn ----- Message ----- From: Rebecka, [facility] [place] Sent: 11/20/2024   2:08 PM EST To: Maude JONELLE Crease, MD

## 2024-11-21 NOTE — Discharge Instructions (Signed)
 Discharge Instructions for Kyphoplasty/Sacroplasty  You may resume a regular diet and any medications that you routinely take (including pain medications). No driving the day of the procedure. Upon discharge go home and rest. Slowly increase your activities as tolerated. Do not lift anything heavier than a jug of milk (roughly eight pounds) for two weeks. You may use an ice pack as needed to the injection sites. You may change the dressing to bandaids tomorrow.  Change the bandaids daily until sites have healed. Follow up with your doctor in 2-3 weeks to let him know how you are feeling. Discuss with your doctor if bone-building therapy is appropriate for you, if you are not on this type of medication already.    Please contact our office at [phone] for the following symptoms or any questions:  Fever greater than 100 degrees Increased swelling, pain, or redness at injection site.   If you need to speak with someone after business hours, please call the Interventional Radiologist on-call service at [phone]. Tell them you are Dr. Terrill patient and that you had a Kyphoplasty/Sacroplasty today, and let them know any issues you are having.   Thank you for visiting [facility]!

## 2024-11-21 NOTE — Telephone Encounter (Signed)
 Call placed to pt.'s daughter Maeola per order of Dr. Timmy to notify her that her mom's iron is borderline.  I think if we gave her some IV iron, this might actually help her bones a little bit.  See if we can give her some Monoferric. Sherri states that Dr. Timmy told pt to stop her iron supplement at her office visit on 11/20/24 and would like to know if pt can restart PO iron instead of receiving IV iron.  Dr. Timmy notified and order received that it is ok for pt to resume oral iron and not receive IV iron at this time.  Sherri notified of MD orders and teach back done.  Sherri is appreciative of call and has no further questions at this time.

## 2024-11-22 ENCOUNTER — Inpatient Hospital Stay
Admission: RE | Admit: 2024-11-22 | Discharge: 2024-11-22 | Disposition: A | Discharge status: Home / Self Care | Source: Ambulatory Visit | Attending: Interventional Radiology | Admitting: Interventional Radiology

## 2024-11-22 ENCOUNTER — Inpatient Hospital Stay: Admission: RE | Admit: 2024-11-22 | Source: Ambulatory Visit

## 2024-11-26 ENCOUNTER — Telehealth: Payer: Self-pay

## 2024-11-26 ENCOUNTER — Encounter: Payer: Self-pay | Admitting: Interventional Radiology

## 2024-11-26 NOTE — Telephone Encounter (Signed)
 Faxed referral to [facility]/Dr. Reyes Alexander at [phone].

## 2024-11-27 ENCOUNTER — Encounter: Payer: Self-pay | Admitting: Interventional Radiology

## 2024-11-27 DIAGNOSIS — M810 Age-related osteoporosis without current pathological fracture: Secondary | ICD-10-CM

## 2024-12-02 ENCOUNTER — Other Ambulatory Visit: Payer: Self-pay

## 2024-12-02 ENCOUNTER — Telehealth: Payer: Self-pay

## 2024-12-02 NOTE — Progress Notes (Signed)
 Specialty Pharmacy Ongoing Clinical Assessment Note  [name] is a 78 y.o. female who is being followed by the specialty pharmacy service for RxSp Oncology   Patient's specialty medication(s) reviewed today: Zanubrutinib  (BRUKINSA )   Missed doses in the last 4 weeks: 0   Patient/Caregiver did not have any additional questions or concerns.   Therapeutic benefit summary: Patient is achieving benefit   Adverse events/side effects summary: No adverse events/side effects   Patient's therapy is appropriate to: Continue    Goals Addressed             This Visit's Progress    Maintain optimal adherence to therapy   On track    Patient is on track. Patient will maintain adherence.         Follow up: 3 months  [facility]

## 2024-12-02 NOTE — Progress Notes (Signed)
 Specialty Pharmacy Refill Coordination Note  [name] is a 78 y.o. female contacted today regarding refills of specialty medication(s) Zanubrutinib  (BRUKINSA )   Patient requested Delivery   Delivery date: 12/06/24   Verified address: [address]  [facility]   Medication will be filled on: 12/05/24

## 2024-12-02 NOTE — Telephone Encounter (Signed)
 See telephone note

## 2024-12-02 NOTE — Progress Notes (Signed)
 Chief Complaint: Patient was seen in consultation today for vertebral compression fractures.   Referring Physician(s): Dr. Timmy  Patient Status: [facility] - Outpatient   History of Present Illness: [name] is a 78 y.o. female with a medical history significant for PE/DVT (Xarelto ), HTN, DM, anxiety/depression, RLS, osteoporosis, dementia and chronic lymphocytic leukemia currently undergoing treatment. She also has a history of chronic back pain which worsened several months ago. Imaging obtained in August, September and October (at [facility]) showed numerous compression fractures and possible metastatic disease. She was treated conservatively with a back brace.     She then presented to the [facility] ED 10/01/24 with severe back pain and was admitted for treatment. She was seen by Dr. Timmy at the hospital and additional imaging studies were ordered. IR was also consulted for a bone marrow biopsy and a biopsy of a left lower leg nodule. Pathology from both procedures were negative for malignancy.    MR imaging of the spine 10/02/24 showed an acute T5 compression fracture with up to an 80% height loss. Several other lumbar and thoracic compression fractures were identified although none as notable as T5. Dr. Timmy was concerned the compression fractures could be pathologic from lymphoma.    She followed up with Dr. Timmy 10/23/24 and reported continued, severe back pain. Dr. Timmy recommended kyphoplasty with possible bone biopsy and she was referred to Interventional Radiology.   She met with me in consultation 11/01/24 and we reviewed her recent imaging. A PET scan was completed 10/29/24 and this was negative for residual or recurrent lymphoma. The thoracolumbar compression deformities showed low level hypermetabolic activity and are likely related to underlying osteoporosis. The patient reported that her pain started approximately in July and has gotten  worse. She takes some pain medications which minimally improve her pain. She scored an 18/24 on the [employer] questionnaire.   After a long discussion, we decided to treat the 3 most severe fractures that are concordant with pain initially, which are T11, T12, and L1. If she as persistent mid-low back pain after treatment, we could then pursue treatment of L2 and L5. She was not tender to percussive palpation at T5. Her pain is in the region of the thoracolumbar junction and we discussed that we would not treat T5.   The patient was agreeable to proceed and she presents to the IR outpatient clinic today for a T11, T12 and L1 kyphoplasty. No changes in symptoms today.  Past Medical History:  Diagnosis Date   Allergic rhinitis    Allergic rhinitis    Anxiety    Arthritis    hands, feet, shoulders, arms (02/22/2018)   Bilateral pulmonary embolism (HCC) 02/21/2018   Chronic cough    CLL (chronic lymphocytic leukemia) (HCC) 04/24/2024   Depression    Disease of thyroid  gland    Dupuytren contracture    both feet, both hands (02/22/2018)   DVT (deep venous thrombosis) (HCC)    bil le   GERD (gastroesophageal reflux disease)    Glaucoma    Hypertension    Hypothyroidism    Iron deficiency anemia due to chronic blood loss 05/23/2024   Mild persistent asthma    cougher not a wheezr gets chokes easily   Pneumonia    PONV (postoperative nausea and vomiting)     Past Surgical History:  Procedure Laterality Date   BLADDER SUSPENSION  X 2   BUNIONECTOMY     RIGHT FOOT ARCH  REPAIRED AS WELL   CATARACT EXTRACTION W/ INTRAOCULAR LENS  IMPLANT, BILATERAL Bilateral    DILATION AND CURETTAGE OF UTERUS  X 3   DUPUYTREN CONTRACTURE RELEASE Bilateral    feet   FASCIECTOMY Right 01/23/2018   Procedure: SEGMENTAL FASCIECTOMY RIGHT MIDDLE FINGER;  Surgeon: Murrell Kuba, MD;  Location: [facility];  Service: Orthopedics;  Laterality: Right;  AXILLARY BLOCK   fracture  surgery right tibia and ankle     IR BONE MARROW BIOPSY & ASPIRATION  10/04/2024   IR RADIOLOGIST EVAL & MGMT  11/01/2024   KNEE ARTHROSCOPY Right    REDUCTION MAMMAPLASTY Bilateral 2018   TOTAL ABDOMINAL HYSTERECTOMY  1975   got pregnant w/IUD in then lost baby   TOTAL KNEE ARTHROPLASTY Right 06/08/2020   Procedure: TOTAL KNEE ARTHROPLASTY;  Surgeon: Rubie Kemps, MD;  Location: [facility];  Service: Orthopedics;  Laterality: Right;    Allergies: Naproxen and Tizanidine   Medications: Prior to Admission medications  Medication Sig Start Date End Date Taking? Authorizing Provider  ADVAIR DISKUS 250-50 MCG/DOSE AEPB Inhale one puff into the lungs 2 (two) times daily. 10/01/20   Gladis Elsie BROCKS, PA-C  albuterol  (PROVENTIL ) (2.5 MG/3ML) 0.083% nebulizer solution Take 3 mLs (2.5 mg total) by nebulization every 6 (six) hours as needed for wheezing or shortness of breath. 11/26/14   Neysa Rama D, MD  amLODipine  (NORVASC ) 10 MG tablet Take 10 mg by mouth daily.     [provider]  dexamethasone  (DECADRON ) 6 MG tablet Take 2 tablets (12 mg total) by mouth daily. 10/06/24   Sigdel, Santosh, MD  DULoxetine  (CYMBALTA ) 60 MG capsule Take 60 mg by mouth daily.    [provider]  famciclovir  (FAMVIR ) 250 MG tablet Take 1 tablet (250 mg total) by mouth daily. 05/22/24   Timmy Maude SAUNDERS, MD  famotidine  (PEPCID ) 20 MG tablet Take 20 mg by mouth at bedtime. 07/17/24   [provider]  ferrous sulfate  324 MG TBEC Take 324 mg by mouth daily with breakfast.    [provider]  fluticasone  (FLONASE ) 50 MCG/ACT nasal spray Place 2 sprays into both nostrils daily. 10/22/21   Gladis Mary-Margaret, FNP  HYDROcodone -acetaminophen  (NORCO) 7.5-325 MG tablet Take 1 tablet by mouth every 6 (six) hours as needed for moderate pain (pain score 4-6). Take 1 tablet by mouth at 7 AM & 8 PM and additional 1 tablet twice a day as needed for unresolved pain 10/23/24   Timmy Maude SAUNDERS, MD   latanoprost  (XALATAN ) 0.005 % ophthalmic solution Place 1 drop into both eyes at bedtime.  08/18/15   [provider]  levothyroxine  (SYNTHROID ) 50 MCG tablet Take 50 mcg by mouth daily before breakfast. 11/02/20   [provider]  meclizine  (ANTIVERT ) 25 MG tablet Take 25 mg by mouth 3 (three) times daily as needed for dizziness. 09/14/20   [provider]  memantine  (NAMENDA ) 10 MG tablet Take 10 mg by mouth in the morning and at bedtime. 05/01/24   [provider]  montelukast  (SINGULAIR ) 10 MG tablet Take 10 mg by mouth at bedtime.    [provider]  pantoprazole  (PROTONIX ) 40 MG tablet Take 40 mg by mouth at bedtime. 08/29/15   [provider]  rOPINIRole  (REQUIP ) 2 MG tablet Take 3 mg by mouth at bedtime. 11/10/14   [provider]  traZODone  (DESYREL ) 50 MG tablet Take 50 mg by mouth at bedtime. 04/04/24 10/23/24  [provider]  TYLENOL  500 MG tablet Take  1,000 mg by mouth every 8 (eight) hours as needed (for pain or headaches).    [provider]  Vitamin D , Ergocalciferol , (DRISDOL) 1.25 MG (50000 UNIT) CAPS capsule Take 50,000 Units by mouth every Sunday. 05/01/24   [provider]  XARELTO  10 MG TABS tablet TAKE ONE TABLET BY MOUTH EVERY DAY WITH SUPPER 09/26/24   Timmy Maude SAUNDERS, MD  zanubrutinib  (BRUKINSA ) 160 MG tablet Take 1 tablet (160 mg total) by mouth 2 (two) times daily. 10/25/24   Timmy Maude SAUNDERS, MD     Family History  Problem Relation Age of Onset   Hypertension Mother    Arthritis Mother    Breast cancer Mother    Colon cancer Mother    Colon polyps Mother    Diabetes Father    Pneumonia Father    Heart attack Father    Heart disease Father    Dementia Father    Diabetes Brother    Breast cancer Maternal Aunt    Sleep apnea Neg Hx     Social History   Socioeconomic History   Marital status: Widowed    Spouse name: Not on file   Number of children: 2   Years of  education: 72   Highest education level: Not on file  Occupational History   Occupation: Retired  Tobacco Use   Smoking status: Never   Smokeless tobacco: Never  Vaping Use   Vaping status: Never Used  Substance and Sexual Activity   Alcohol use: Not Currently    Comment: 02/22/2018 might have a drink q couple years   Drug use: No   Sexual activity: Not Currently  Other Topics Concern   Not on file  Social History Narrative   ** Merged History Encounter **       Lives at home with her brother. Right-handed. 3-4 cups coffee per day.   Social Drivers of Health   Tobacco Use: Low Risk (10/23/2024)   Patient History    Smoking Tobacco Use: Never    Smokeless Tobacco Use: Never    Passive Exposure: Not on file  Financial Resource Strain: Low Risk (08/02/2024)   Received from [facility]   Overall Financial Resource Strain (CARDIA)    How hard is it for you to pay for the very basics like food, housing, medical care, and heating?: Not hard at all  Food Insecurity: No Food Insecurity (10/02/2024)   Epic    Worried About Programme Researcher, Broadcasting/film/video in the Last Year: Never true    Ran Out of Food in the Last Year: Never true  Transportation Needs: No Transportation Needs (10/02/2024)   Epic    Lack of Transportation (Medical): No    Lack of Transportation (Non-Medical): No  Physical Activity: Inactive (08/02/2024)   Received from [facility]   Exercise Vital Sign    On average, how many days per week do you engage in moderate to strenuous exercise (like a brisk walk)?: 0 days    Minutes of Exercise per Session: Not on file  Stress: Stress Concern Present (08/02/2024)   Received from [facility] of Occupational [facility] - Occupational Stress Questionnaire    Do you feel stress - tense, restless, nervous, or anxious, or unable to sleep at night because your mind is troubled all the time - these days?: To some extent  Social Connections: Socially Isolated  (10/02/2024)   Social Connection and Isolation Panel    Frequency of Communication with Friends and  Family: Three times a week    Frequency of Social Gatherings with Friends and Family: Never    Attends Religious Services: Never    Database Administrator or Organizations: No    Attends Banker Meetings: Never    Marital Status: Widowed  Depression (PHQ2-9): Low Risk (11/20/2024)   Depression (PHQ2-9)    PHQ-2 Score: 0  Alcohol Screen: Not on file  Housing: Low Risk (10/02/2024)   Epic    Unable to Pay for Housing in the Last Year: No    Number of Times Moved in the Last Year: 0    Homeless in the Last Year: No  Utilities: Not At Risk (10/02/2024)   Epic    Threatened with loss of utilities: No  Health Literacy: Not on file    Review of Systems: A 12 point ROS discussed and pertinent positives are indicated in the HPI above.  All other systems are negative.   Vital Signs: There were no vitals taken for this visit.  Physical Exam Constitutional:      General: She is not in acute distress. HENT:     Head: Normocephalic.     Nose: Nose normal.  Eyes:     General: No scleral icterus. Cardiovascular:     Rate and Rhythm: Normal rate.  Pulmonary:     Effort: No respiratory distress.  Abdominal:     General: There is no distension.  Musculoskeletal:     Right lower leg: No edema.     Left lower leg: No edema.  Skin:    General: Skin is warm and dry.  Neurological:     Mental Status: She is alert and oriented to person, place, and time.     Imaging: No results found.  Labs:  CBC: Recent Labs    10/04/24 0455 10/05/24 0521 10/23/24 0843 11/20/24 1347  WBC 4.9 4.5 9.1 7.7  HGB 11.6* 11.4* 14.6 13.5  HCT 37.0 35.0* 45.0 42.2  PLT 202 209 323 323    COAGS: No results for input(s): INR, APTT in the last 8760 hours.  BMP: Recent Labs    10/04/24 0455 10/05/24 0521 10/23/24 0843 11/20/24 1347  NA 139 139 139 140  K 3.8 3.5 3.7 4.7  CL  105 106 101 104  CO2 23 23 25 25   GLUCOSE 132* 111* 169* 131*  BUN 11 10 17 11   CALCIUM 9.5 8.8* 9.4 9.7  CREATININE 0.67 0.62 0.87 0.82  GFRNONAA >60 >60 >60 >60    LIVER FUNCTION TESTS: Recent Labs    10/03/24 0704 10/04/24 0455 10/23/24 0843 11/20/24 1347  BILITOT 0.4 0.3 0.7 0.5  AST 18 20 17 21   ALT 9 9 15 10   ALKPHOS 156* 148* 165* 124  PROT 6.0* 5.8* 6.9 6.4*  ALBUMIN 3.8 3.8 4.4 4.1    TUMOR MARKERS: No results for input(s): AFPTM, CEA, CA199, CHROMGRNA in the last 8760 hours.  Assessment and Plan:  Multiple vertebral compression fractures with T11, T12 and L1 being most severe: [name], 78 year old female, presents today for a T11, T12 and L1 kyphoplasty/vertebroplasty.   Risks and benefits of T11, T12 and L1 kyphoplasty/vertebroplasty were discussed with the patient including, but not limited to education regarding the natural healing process of compression fractures without intervention, bleeding, infection, cement migration which may cause spinal cord damage, paralysis, pulmonary embolism or even death.  All of the patient's questions were answered, patient is agreeable to proceed. She has been NPO. Xarelto  has  been held.   Consent signed and in chart.  Ester Sides, MD Pager: [phone]

## 2024-12-04 ENCOUNTER — Telehealth: Payer: Self-pay

## 2024-12-04 ENCOUNTER — Other Ambulatory Visit

## 2024-12-04 ENCOUNTER — Other Ambulatory Visit (HOSPITAL_COMMUNITY): Payer: Self-pay

## 2024-12-04 ENCOUNTER — Inpatient Hospital Stay: Admission: RE | Admit: 2024-12-04

## 2024-12-04 ENCOUNTER — Other Ambulatory Visit (HOSPITAL_COMMUNITY)
Admission: RE | Admit: 2024-12-04 | Discharge: 2024-12-04 | Disposition: A | Source: Ambulatory Visit | Attending: Interventional Radiology | Admitting: Interventional Radiology

## 2024-12-04 ENCOUNTER — Other Ambulatory Visit: Payer: Self-pay | Admitting: Interventional Radiology

## 2024-12-04 VITALS — BP 129/67 | HR 90 | Temp 98.1°F | Resp 13 | Wt 132.0 lb

## 2024-12-04 DIAGNOSIS — M8008XA Age-related osteoporosis with current pathological fracture, vertebra(e), initial encounter for fracture: Secondary | ICD-10-CM | POA: Insufficient documentation

## 2024-12-04 HISTORY — PX: IR KYPHO EA ADDL LEVEL THORACIC OR LUMBAR: IMG5520

## 2024-12-04 HISTORY — PX: IR KYPHO LUMBAR INC FX REDUCE BONE BX UNI/BIL CANNULATION INC/IMAGING: IMG5519

## 2024-12-04 HISTORY — PX: IR KYPHO THORACIC WITH BONE BIOPSY: IMG5518

## 2024-12-04 HISTORY — PX: IR IVC FILTER RETRIEVAL / S&I /IMG GUID/MOD SED: IMG5308

## 2024-12-04 MED ORDER — LIDOCAINE-EPINEPHRINE 1 %-1:100000 IJ SOLN
50.0000 mL | Freq: Once | INTRAMUSCULAR | Status: AC
  Administered 2024-12-04: 09:00:00 50 mL via INTRADERMAL

## 2024-12-04 MED ORDER — CEFAZOLIN SODIUM-DEXTROSE 2-4 GM/100ML-% IV SOLN
2.0000 g | Freq: Once | INTRAVENOUS | Status: AC
  Administered 2024-12-04: 08:00:00 2 g via INTRAVENOUS

## 2024-12-04 MED ORDER — FENTANYL CITRATE (PF) 50 MCG/ML IJ SOSY: 25.0000 ug | PREFILLED_SYRINGE | INTRAMUSCULAR | Status: DC | PRN

## 2024-12-04 MED ORDER — KETOROLAC TROMETHAMINE 30 MG/ML IJ SOLN
30.0000 mg | Freq: Once | INTRAMUSCULAR | Status: AC
  Administered 2024-12-04: 09:00:00 30 mg via INTRAVENOUS

## 2024-12-04 MED ORDER — SODIUM CHLORIDE 0.9 % IV SOLN: INTRAVENOUS | Status: DC

## 2024-12-04 MED ORDER — FENTANYL CITRATE (PF) 100 MCG/2ML IJ SOLN
INTRAMUSCULAR | Status: AC | PRN
  Administered 2024-12-04 (×4): 50 ug via INTRAVENOUS

## 2024-12-04 MED ORDER — ACETAMINOPHEN 10 MG/ML IV SOLN
1000.0000 mg | Freq: Once | INTRAVENOUS | Status: AC
  Administered 2024-12-04: 08:00:00 1000 mg via INTRAVENOUS

## 2024-12-04 MED ORDER — MIDAZOLAM HCL (PF) 2 MG/2ML IJ SOLN: 1.0000 mg | INTRAMUSCULAR | Status: DC | PRN

## 2024-12-04 MED ORDER — MIDAZOLAM HCL (PF) 2 MG/2ML IJ SOLN
INTRAMUSCULAR | Status: AC | PRN
  Administered 2024-12-04: 09:00:00 .5 mg via INTRAVENOUS
  Administered 2024-12-04: 09:00:00 1 mg via INTRAVENOUS
  Administered 2024-12-04 (×2): .5 mg via INTRAVENOUS
  Administered 2024-12-04 (×2): 1 mg via INTRAVENOUS
  Administered 2024-12-04: 10:00:00 .5 mg via INTRAVENOUS

## 2024-12-04 NOTE — Telephone Encounter (Signed)
 Oral Oncology Patient Advocate Encounter  Prior Authorization renewal  for Brukinsa  has been approved.    Key [id number]  Effective dates: 09/18/2024 through 12/18/2025     Charlott Hamilton,  CPhT-Adv  she/her/hers [place]  [facility] Pharmacy Technician Patient Advocate Specialist III [facility] Phone: [phone]  Fax: [phone] [email or web address]

## 2024-12-04 NOTE — Procedures (Signed)
 Interventional Radiology Procedure Note  Procedure:  1) T11 kyphoplasty 2) T11 bone biopsy 3) T12 kyphoplasty 4) T12 bone biopsy 5) L1 kyphoplasty 7) L1 bone biopsy  Findings: Please refer to procedural dictation for full description.  Complications: None immediate  Estimated Blood Loss: 10 mL  Recommendations: Follow Pathology results. IR will arrange for 2-3 week outpatient follow up.   Ester Sides, MD

## 2024-12-04 NOTE — Sedation Documentation (Signed)
 Inserting cement

## 2024-12-04 NOTE — Telephone Encounter (Signed)
 Oral Oncology Patient Advocate Encounter   Received notification that prior authorization for Brukinsa  is due for renewal.   PA submitted on 12/04/2024 Key [id number] Status is pending      Charlott Hamilton,  CPhT-Adv  she/her/hers [facility] Pharmacy Technician Patient Advocate Specialist III [facility] Phone: [phone]  Fax: [phone] [email or web address]

## 2024-12-09 ENCOUNTER — Ambulatory Visit: Payer: Self-pay | Admitting: Hematology & Oncology

## 2024-12-09 LAB — SURGICAL PATHOLOGY

## 2024-12-27 ENCOUNTER — Other Ambulatory Visit: Payer: Self-pay

## 2024-12-31 ENCOUNTER — Other Ambulatory Visit (HOSPITAL_COMMUNITY): Payer: Self-pay

## 2025-01-01 ENCOUNTER — Other Ambulatory Visit: Payer: Self-pay

## 2025-01-01 NOTE — Progress Notes (Signed)
 Specialty Pharmacy Refill Coordination Note  [name] is a 79 y.o. female contacted today regarding refills of specialty medication(s) Zanubrutinib  (BRUKINSA )   Patient requested Delivery   Delivery date: 01/03/25   Verified address: [address]  [facility] [place]   Medication will be filled on: 01/02/25

## 2025-01-02 ENCOUNTER — Other Ambulatory Visit: Payer: Self-pay

## 2025-01-07 ENCOUNTER — Encounter: Payer: Self-pay | Admitting: Interventional Radiology

## 2025-01-07 ENCOUNTER — Telehealth: Payer: Self-pay | Admitting: *Deleted

## 2025-01-07 NOTE — Telephone Encounter (Signed)
 Spoke with patients daughter to follow up from Kyphoplasty 12/04/24. Patients daughter states she is doing good and doesn't feel she needs follow up at this time.[name]

## 2025-01-08 ENCOUNTER — Inpatient Hospital Stay: Attending: Hematology & Oncology

## 2025-01-08 ENCOUNTER — Inpatient Hospital Stay: Admitting: Hematology & Oncology

## 2025-01-08 ENCOUNTER — Encounter: Payer: Self-pay | Admitting: Hematology & Oncology

## 2025-01-08 VITALS — BP 151/86 | HR 99 | Temp 97.6°F | Resp 20 | Ht 61.0 in | Wt 130.0 lb

## 2025-01-08 DIAGNOSIS — C9110 Chronic lymphocytic leukemia of B-cell type not having achieved remission: Secondary | ICD-10-CM

## 2025-01-08 DIAGNOSIS — E039 Hypothyroidism, unspecified: Secondary | ICD-10-CM

## 2025-01-08 DIAGNOSIS — M80061K Age-related osteoporosis with current pathological fracture, right lower leg, subsequent encounter for fracture with nonunion: Secondary | ICD-10-CM

## 2025-01-08 DIAGNOSIS — D500 Iron deficiency anemia secondary to blood loss (chronic): Secondary | ICD-10-CM

## 2025-01-08 LAB — CBC WITH DIFFERENTIAL (CANCER CENTER ONLY)
Abs Immature Granulocytes: 0 K/uL (ref 0.00–0.07)
Band Neutrophils: 0 %
Basophils Absolute: 0.1 K/uL (ref 0.0–0.1)
Basophils Relative: 2 %
Eosinophils Absolute: 0.2 K/uL (ref 0.0–0.5)
Eosinophils Relative: 2 %
HCT: 42.6 % (ref 36.0–46.0)
Hemoglobin: 13.8 g/dL (ref 12.0–15.0)
Immature Granulocytes: 0 %
Lymphocytes Relative: 26 %
Lymphs Abs: 1.8 K/uL (ref 0.7–4.0)
MCH: 29.8 pg (ref 26.0–34.0)
MCHC: 32.4 g/dL (ref 30.0–36.0)
MCV: 92 fL (ref 80.0–100.0)
Monocytes Absolute: 0.6 K/uL (ref 0.1–1.0)
Monocytes Relative: 8 %
Neutro Abs: 4.1 K/uL (ref 1.7–7.7)
Neutrophils Relative %: 60 %
Platelet Count: 328 K/uL (ref 150–400)
RBC: 4.63 MIL/uL (ref 3.87–5.11)
RDW: 14.1 % (ref 11.5–15.5)
WBC Count: 6.8 K/uL (ref 4.0–10.5)
nRBC: 0 % (ref 0.0–0.2)
nRBC: 0 /100{WBCs}

## 2025-01-08 LAB — CMP (CANCER CENTER ONLY)
ALT: 9 U/L (ref 0–44)
AST: 18 U/L (ref 15–41)
Albumin: 4.3 g/dL (ref 3.5–5.0)
Alkaline Phosphatase: 93 U/L (ref 38–126)
Anion gap: 12 (ref 5–15)
BUN: 11 mg/dL (ref 8–23)
CO2: 23 mmol/L (ref 22–32)
Calcium: 9.6 mg/dL (ref 8.9–10.3)
Chloride: 106 mmol/L (ref 98–111)
Creatinine: 0.83 mg/dL (ref 0.44–1.00)
GFR, Estimated: >60 mL/min
Glucose, Bld: 126 mg/dL — ABNORMAL HIGH (ref 70–99)
Potassium: 4.3 mmol/L (ref 3.5–5.1)
Sodium: 141 mmol/L (ref 135–145)
Total Bilirubin: 0.3 mg/dL (ref 0.0–1.2)
Total Protein: 6.5 g/dL (ref 6.5–8.1)

## 2025-01-08 LAB — LACTATE DEHYDROGENASE: LDH: 276 U/L — ABNORMAL HIGH (ref 105–235)

## 2025-01-08 LAB — VITAMIN D 25 HYDROXY (VIT D DEFICIENCY, FRACTURES): Vit D, 25-Hydroxy: 91.2 ng/mL (ref 30–100)

## 2025-01-08 NOTE — Progress Notes (Signed)
 " Hematology and Oncology Follow Up Visit  [name] [id number] 1946/01/05 79 y.o. 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 q day -- maintanence on 09/06/2018 EC ASA 81 mg po q day Gazyva /Brukinsa /Venetoclax -- start on 05/28/2024 -currently taking Brukinsa .   Interim History:  [name] is here today for follow-up.  She looks a whole lot better.  She had kyphoplasty for her spine.  She had this done on December 17.  Thankfully, biopsies that were taken from 3 bones did not show any evidence of leukemia.  I am just happy that she is doing better.  I am happy that her back pain is much less now.  She is more functional.  She was able to enjoy the holidays..  She has had no problems with the Brukinsa .  I know that she does have some element of dementia.  Thankfully, her daughter make sure that all of her pills are put out for her to take.  She has had no fever.  There has been no bleeding.  She has had no change in bowel or bladder habits.  There has been no leg swelling.  She has had no rashes.  Overall, I would say that her performance status is probably ECOG 1.   Medications:  Allergies as of 01/08/2025       Reactions   Naproxen Shortness Of Breath, Itching, Swelling, Other (See Comments)   Angioedema   Tizanidine  Shortness Of Breath, Other (See Comments)   Dizziness, also        Medication List        Accurate as of January 08, 2025  1:48 PM. If you have any questions, ask your nurse or doctor.          STOP taking these medications    dexamethasone  6 MG tablet Commonly known as: DECADRON  Stopped by: Maude Crease, MD       TAKE these medications    rOPINIRole  2 MG tablet Commonly known as: REQUIP  Take 3 mg by mouth at  bedtime. The timing of this medication is very important.   Advair Diskus 250-50 MCG/DOSE Aepb Generic drug: fluticasone -salmeterol Inhale one puff into the lungs 2 (two) times daily.   albuterol  (2.5 MG/3ML) 0.083% nebulizer solution Commonly known as: PROVENTIL  Take 3 mLs (2.5 mg total) by nebulization every 6 (six) hours as needed for wheezing or shortness of breath.   amLODipine  10 MG tablet Commonly known as: NORVASC  Take 10 mg by mouth daily.   Brukinsa  160 MG tablet Generic drug: zanubrutinib  Take 1 tablet (160 mg total) by mouth 2 (two) times daily.   DULoxetine  60 MG capsule Commonly known as: CYMBALTA  Take 60 mg by mouth daily.   famciclovir  250 MG tablet Commonly known as: FAMVIR  Take 1 tablet (250 mg total) by mouth daily.   famotidine  20 MG tablet Commonly known as: PEPCID  Take 20 mg by mouth at bedtime.   ferrous sulfate  324 MG Tbec Take 324 mg by mouth daily with breakfast.   fluticasone  50 MCG/ACT nasal spray Commonly known as: FLONASE  Place 2 sprays into both nostrils daily.   HYDROcodone -acetaminophen  7.5-325 MG tablet Commonly known as: NORCO Take 1 tablet by mouth every 6 (six) hours as needed for moderate pain (pain score 4-6). Take 1 tablet by mouth at  7 AM & 8 PM and additional 1 tablet twice a day as needed for unresolved pain   latanoprost  0.005 % ophthalmic solution Commonly known as: XALATAN  Place 1 drop into both eyes at bedtime.   levothyroxine  50 MCG tablet Commonly known as: SYNTHROID  Take 50 mcg by mouth daily before breakfast.   meclizine  25 MG tablet Commonly known as: ANTIVERT  Take 25 mg by mouth 3 (three) times daily as needed for dizziness.   memantine  10 MG tablet Commonly known as: NAMENDA  Take 10 mg by mouth in the morning and at bedtime.   montelukast  10 MG tablet Commonly known as: SINGULAIR  Take 10 mg by mouth at bedtime.   pantoprazole  40 MG tablet Commonly known as: PROTONIX  Take 40 mg by mouth at bedtime.    traZODone  50 MG tablet Commonly known as: DESYREL  Take 50 mg by mouth at bedtime.   TYLENOL  500 MG tablet Generic drug: acetaminophen  Take 1,000 mg by mouth every 8 (eight) hours as needed (for pain or headaches).   Vitamin D  (Ergocalciferol ) 1.25 MG (50000 UNIT) Caps capsule Commonly known as: DRISDOL Take 50,000 Units by mouth every Sunday.   Xarelto  10 MG Tabs tablet Generic drug: rivaroxaban  TAKE ONE TABLET BY MOUTH EVERY DAY WITH SUPPER        Allergies:  Allergies  Allergen Reactions   Naproxen Shortness Of Breath, Itching, Swelling and Other (See Comments)    Angioedema   Tizanidine  Shortness Of Breath and Other (See Comments)    Dizziness, also    Past Medical History, Surgical history, Social history, and Family History were reviewed and updated.  Review of Systems: Review of Systems  Constitutional:  Positive for malaise/fatigue.  HENT: Negative.    Eyes: Negative.   Respiratory: Negative.    Cardiovascular: Negative.   Gastrointestinal: Negative.   Genitourinary: Negative.   Musculoskeletal:  Positive for back pain.  Skin: Negative.   Neurological: Negative.   Endo/Heme/Allergies: Negative.   Psychiatric/Behavioral: Negative.       Physical Exam:  Vital signs show temperature of 97.6.  Pulse 99.  Blood pressure 151/86.  Weight is 130 pounds. .     Wt Readings from Last 3 Encounters:  01/08/25 130 lb (59 kg)  12/04/24 132 lb (59.9 kg)  11/20/24 130 lb 12.8 oz (59.3 kg)    Physical Exam Vitals reviewed.  HENT:     Head: Normocephalic and atraumatic.  Eyes:     Pupils: Pupils are equal, round, and reactive to light.  Cardiovascular:     Rate and Rhythm: Normal rate and regular rhythm.     Heart sounds: Normal heart sounds.  Pulmonary:     Effort: Pulmonary effort is normal.     Breath sounds: Normal breath sounds.  Abdominal:     General: Bowel sounds are normal.     Palpations: Abdomen is soft.  Musculoskeletal:        General: No  tenderness or deformity. Normal range of motion.     Cervical back: Normal range of motion.     Comments: Back exam shows tenderness to the back.  She has back brace on.  She has very limited mobility.  In the left lower leg, she does have a subcutaneous nodule that is mobile.  It is nontender.  It is in the upper part of the left lower leg.  It is medial to the tibia.  It probably measures about 2 cm.  Lymphadenopathy:     Cervical: No cervical adenopathy.  Skin:  General: Skin is warm and dry.     Findings: No erythema or rash.  Neurological:     Mental Status: She is alert and oriented to person, place, and time.  Psychiatric:        Behavior: Behavior normal.        Thought Content: Thought content normal.        Judgment: Judgment normal.      Lab Results  Component Value Date   WBC 7.7 11/20/2024   HGB 13.5 11/20/2024   HCT 42.2 11/20/2024   MCV 93.6 11/20/2024   PLT 323 11/20/2024   Lab Results  Component Value Date   FERRITIN 859 (H) 11/20/2024   IRON 43 11/20/2024   TIBC 287 11/20/2024   UIBC 244 11/20/2024   IRONPCTSAT 15 11/20/2024   Lab Results  Component Value Date   RETICCTPCT 2.3 05/22/2024   RBC 4.51 11/20/2024   Lab Results  Component Value Date   KPAFRELGTCHN 111.9 (H) 05/22/2024   LAMBDASER 18.5 05/22/2024   KAPLAMBRATIO 6.05 (H) 05/22/2024   Lab Results  Component Value Date   IGGSERUM 741 05/22/2024   IGA 107 05/22/2024   IGMSERUM 255 (H) 05/22/2024   No results found for: STEPHANY CARLOTA BENSON MARKEL EARLA JOANNIE DOC VICK, SPEI   Chemistry      Component Value Date/Time   NA 140 11/20/2024 1347   NA 141 11/03/2015 1117   K 4.7 11/20/2024 1347   CL 104 11/20/2024 1347   CO2 25 11/20/2024 1347   BUN 11 11/20/2024 1347   BUN 22 11/03/2015 1117   CREATININE 0.82 11/20/2024 1347      Component Value Date/Time   CALCIUM 9.7 11/20/2024 1347   ALKPHOS 124 11/20/2024 1347   AST 21 11/20/2024 1347    ALT 10 11/20/2024 1347   BILITOT 0.5 11/20/2024 1347       Impression and Plan: Ms. Grillot is a very pleasant 79 yo caucasian female with history of pulmonary emboli and lower extremity DVT with transiently positive lupus anticoagulant (resolved).  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  She was treated.  She did well with treatment.  However, she had a lot of issues with her poor back which was nothing related to her CLL.  The bone marrow biopsy that she had in October was negative for any active CLL.  The treatment has certainly helped.  She is still on Brukinsa .  We will keep her on Brukinsa  for now.  Again, as long as she has a back taking care of, I know that she will do well.  We will now plan to get her back in about 8 weeks or so.  We will get her through the Winter.  I know it is still a little bit difficult to refer her to get to the office.  I do not want to cause any more inconvenience for her.    Maude JONELLE Crease, MD 1/21/20261:48 PM  "

## 2025-01-09 LAB — BETA 2 MICROGLOBULIN, SERUM: Beta-2 Microglobulin: 2.3 mg/L (ref 0.6–2.4)

## 2025-01-10 ENCOUNTER — Other Ambulatory Visit: Payer: Self-pay

## 2025-01-24 ENCOUNTER — Other Ambulatory Visit: Payer: Self-pay

## 2025-03-07 ENCOUNTER — Inpatient Hospital Stay: Payer: PRIVATE HEALTH INSURANCE | Admitting: Hematology & Oncology

## 2025-03-07 ENCOUNTER — Inpatient Hospital Stay: Attending: Hematology & Oncology
